# Patient Record
Sex: Female | Born: 1950 | Race: Black or African American | Hispanic: No | Marital: Married | Smoking: Never smoker
Health system: Southern US, Community
[De-identification: ages and names within clinical notes are randomized; demographics above are authoritative.]

## PROBLEM LIST (undated history)

## (undated) DIAGNOSIS — I10 Essential (primary) hypertension: Secondary | ICD-10-CM

## (undated) DIAGNOSIS — G43909 Migraine, unspecified, not intractable, without status migrainosus: Secondary | ICD-10-CM

## (undated) DIAGNOSIS — I2510 Atherosclerotic heart disease of native coronary artery without angina pectoris: Secondary | ICD-10-CM

## (undated) DIAGNOSIS — R112 Nausea with vomiting, unspecified: Secondary | ICD-10-CM

## (undated) DIAGNOSIS — I700 Atherosclerosis of aorta: Secondary | ICD-10-CM

## (undated) DIAGNOSIS — D126 Benign neoplasm of colon, unspecified: Secondary | ICD-10-CM

## (undated) DIAGNOSIS — N920 Excessive and frequent menstruation with regular cycle: Secondary | ICD-10-CM

## (undated) DIAGNOSIS — J309 Allergic rhinitis, unspecified: Secondary | ICD-10-CM

## (undated) DIAGNOSIS — K219 Gastro-esophageal reflux disease without esophagitis: Secondary | ICD-10-CM

## (undated) DIAGNOSIS — J45909 Unspecified asthma, uncomplicated: Secondary | ICD-10-CM

## (undated) DIAGNOSIS — E538 Deficiency of other specified B group vitamins: Secondary | ICD-10-CM

## (undated) DIAGNOSIS — E785 Hyperlipidemia, unspecified: Secondary | ICD-10-CM

## (undated) DIAGNOSIS — A64 Unspecified sexually transmitted disease: Secondary | ICD-10-CM

## (undated) DIAGNOSIS — M797 Fibromyalgia: Secondary | ICD-10-CM

## (undated) DIAGNOSIS — G479 Sleep disorder, unspecified: Secondary | ICD-10-CM

## (undated) DIAGNOSIS — Z9889 Other specified postprocedural states: Secondary | ICD-10-CM

## (undated) DIAGNOSIS — K2950 Unspecified chronic gastritis without bleeding: Secondary | ICD-10-CM

## (undated) DIAGNOSIS — I491 Atrial premature depolarization: Secondary | ICD-10-CM

## (undated) DIAGNOSIS — M1990 Unspecified osteoarthritis, unspecified site: Secondary | ICD-10-CM

## (undated) DIAGNOSIS — T7840XA Allergy, unspecified, initial encounter: Secondary | ICD-10-CM

## (undated) HISTORY — PX: TONSILLECTOMY: SHX5217

## (undated) HISTORY — DX: Unspecified osteoarthritis, unspecified site: M19.90

## (undated) HISTORY — PX: OTHER SURGICAL HISTORY: SHX169

## (undated) HISTORY — DX: Atherosclerotic heart disease of native coronary artery without angina pectoris: I25.10

## (undated) HISTORY — DX: Benign neoplasm of colon, unspecified: D12.6

## (undated) HISTORY — DX: Gastro-esophageal reflux disease without esophagitis: K21.9

## (undated) HISTORY — DX: Atrial premature depolarization: I49.1

## (undated) HISTORY — DX: Hyperlipidemia, unspecified: E78.5

## (undated) HISTORY — DX: Unspecified asthma, uncomplicated: J45.909

## (undated) HISTORY — DX: Essential (primary) hypertension: I10

## (undated) HISTORY — DX: Allergic rhinitis, unspecified: J30.9

## (undated) HISTORY — DX: Deficiency of other specified B group vitamins: E53.8

## (undated) HISTORY — DX: Allergy, unspecified, initial encounter: T78.40XA

## (undated) HISTORY — DX: Excessive and frequent menstruation with regular cycle: N92.0

## (undated) HISTORY — DX: Atherosclerosis of aorta: I70.0

## (undated) HISTORY — DX: Sleep disorder, unspecified: G47.9

## (undated) HISTORY — DX: Unspecified sexually transmitted disease: A64

## (undated) HISTORY — DX: Fibromyalgia: M79.7

## (undated) HISTORY — PX: CATARACT EXTRACTION: SUR2

## (undated) HISTORY — DX: Unspecified chronic gastritis without bleeding: K29.50

---

## 1996-02-28 ENCOUNTER — Encounter (INDEPENDENT_AMBULATORY_CARE_PROVIDER_SITE_OTHER): Payer: Self-pay | Admitting: Gastroenterology

## 1996-05-03 HISTORY — PX: VAGINAL HYSTERECTOMY: SHX2639

## 1997-10-29 ENCOUNTER — Encounter: Payer: Self-pay | Admitting: Gastroenterology

## 1997-10-30 ENCOUNTER — Encounter: Payer: Self-pay | Admitting: Gastroenterology

## 1998-10-20 ENCOUNTER — Ambulatory Visit (HOSPITAL_COMMUNITY): Admission: RE | Admit: 1998-10-20 | Discharge: 1998-10-20 | Payer: Self-pay | Admitting: *Deleted

## 1998-10-20 ENCOUNTER — Encounter: Payer: Self-pay | Admitting: *Deleted

## 1998-10-24 ENCOUNTER — Ambulatory Visit (HOSPITAL_COMMUNITY): Admission: RE | Admit: 1998-10-24 | Discharge: 1998-10-24 | Payer: Self-pay | Admitting: *Deleted

## 1999-09-15 ENCOUNTER — Encounter: Admission: RE | Admit: 1999-09-15 | Discharge: 1999-10-19 | Payer: Self-pay | Admitting: *Deleted

## 2000-06-22 ENCOUNTER — Encounter: Payer: Self-pay | Admitting: *Deleted

## 2000-06-22 ENCOUNTER — Encounter: Admission: RE | Admit: 2000-06-22 | Discharge: 2000-06-22 | Payer: Self-pay | Admitting: *Deleted

## 2001-05-03 HISTORY — PX: OTHER SURGICAL HISTORY: SHX169

## 2001-07-20 ENCOUNTER — Encounter: Payer: Self-pay | Admitting: Gastroenterology

## 2001-11-07 ENCOUNTER — Encounter: Admission: RE | Admit: 2001-11-07 | Discharge: 2002-02-05 | Payer: Self-pay | Admitting: Internal Medicine

## 2001-11-21 ENCOUNTER — Ambulatory Visit (HOSPITAL_COMMUNITY): Admission: RE | Admit: 2001-11-21 | Discharge: 2001-11-21 | Payer: Self-pay | Admitting: Internal Medicine

## 2001-11-21 ENCOUNTER — Encounter: Payer: Self-pay | Admitting: Internal Medicine

## 2001-11-23 ENCOUNTER — Other Ambulatory Visit: Admission: RE | Admit: 2001-11-23 | Discharge: 2001-11-23 | Payer: Self-pay | Admitting: Obstetrics and Gynecology

## 2001-12-19 ENCOUNTER — Encounter: Payer: Self-pay | Admitting: Internal Medicine

## 2001-12-19 ENCOUNTER — Encounter: Admission: RE | Admit: 2001-12-19 | Discharge: 2001-12-19 | Payer: Self-pay | Admitting: Internal Medicine

## 2002-11-28 ENCOUNTER — Other Ambulatory Visit: Admission: RE | Admit: 2002-11-28 | Discharge: 2002-11-28 | Payer: Self-pay | Admitting: Obstetrics and Gynecology

## 2003-11-22 ENCOUNTER — Encounter: Admission: RE | Admit: 2003-11-22 | Discharge: 2003-11-22 | Payer: Self-pay | Admitting: Emergency Medicine

## 2003-12-03 ENCOUNTER — Encounter: Admission: RE | Admit: 2003-12-03 | Discharge: 2003-12-03 | Payer: Self-pay | Admitting: Emergency Medicine

## 2004-01-15 ENCOUNTER — Encounter: Payer: Self-pay | Admitting: Gastroenterology

## 2004-03-12 ENCOUNTER — Encounter: Admission: RE | Admit: 2004-03-12 | Discharge: 2004-03-12 | Payer: Self-pay | Admitting: Emergency Medicine

## 2004-11-15 ENCOUNTER — Observation Stay (HOSPITAL_COMMUNITY): Admission: EM | Admit: 2004-11-15 | Discharge: 2004-11-16 | Payer: Self-pay | Admitting: *Deleted

## 2004-12-24 ENCOUNTER — Encounter: Admission: RE | Admit: 2004-12-24 | Discharge: 2004-12-24 | Payer: Self-pay | Admitting: Emergency Medicine

## 2005-01-06 ENCOUNTER — Ambulatory Visit (HOSPITAL_BASED_OUTPATIENT_CLINIC_OR_DEPARTMENT_OTHER): Admission: RE | Admit: 2005-01-06 | Discharge: 2005-01-06 | Payer: Self-pay | Admitting: Emergency Medicine

## 2005-01-10 ENCOUNTER — Ambulatory Visit: Payer: Self-pay | Admitting: Internal Medicine

## 2005-05-20 ENCOUNTER — Ambulatory Visit: Payer: Self-pay | Admitting: Gastroenterology

## 2005-06-02 ENCOUNTER — Encounter (INDEPENDENT_AMBULATORY_CARE_PROVIDER_SITE_OTHER): Payer: Self-pay | Admitting: Specialist

## 2005-06-02 ENCOUNTER — Ambulatory Visit: Payer: Self-pay | Admitting: Gastroenterology

## 2006-04-04 ENCOUNTER — Encounter: Admission: RE | Admit: 2006-04-04 | Discharge: 2006-04-04 | Payer: Self-pay | Admitting: Emergency Medicine

## 2006-09-02 ENCOUNTER — Emergency Department (HOSPITAL_COMMUNITY): Admission: EM | Admit: 2006-09-02 | Discharge: 2006-09-02 | Payer: Self-pay | Admitting: *Deleted

## 2007-04-24 ENCOUNTER — Encounter: Admission: RE | Admit: 2007-04-24 | Discharge: 2007-04-24 | Payer: Self-pay | Admitting: Emergency Medicine

## 2007-09-07 ENCOUNTER — Encounter: Admission: RE | Admit: 2007-09-07 | Discharge: 2007-09-07 | Payer: Self-pay | Admitting: Cardiology

## 2007-11-20 ENCOUNTER — Encounter: Admission: RE | Admit: 2007-11-20 | Discharge: 2007-11-20 | Payer: Self-pay | Admitting: Emergency Medicine

## 2007-11-21 ENCOUNTER — Encounter (INDEPENDENT_AMBULATORY_CARE_PROVIDER_SITE_OTHER): Payer: Self-pay | Admitting: *Deleted

## 2007-11-21 ENCOUNTER — Encounter: Admission: RE | Admit: 2007-11-21 | Discharge: 2007-11-21 | Payer: Self-pay | Admitting: Emergency Medicine

## 2008-05-13 ENCOUNTER — Encounter: Admission: RE | Admit: 2008-05-13 | Discharge: 2008-05-13 | Payer: Self-pay | Admitting: Emergency Medicine

## 2008-05-20 ENCOUNTER — Encounter: Payer: Self-pay | Admitting: Family Medicine

## 2008-10-01 LAB — CONVERTED CEMR LAB
Pap Smear: NORMAL
Pap Smear: NORMAL
Pap Smear: NORMAL
Pap Smear: NORMAL
Pap Smear: NORMAL
Pap Smear: NORMAL

## 2008-12-10 ENCOUNTER — Encounter (INDEPENDENT_AMBULATORY_CARE_PROVIDER_SITE_OTHER): Payer: Self-pay | Admitting: *Deleted

## 2008-12-18 ENCOUNTER — Ambulatory Visit: Payer: Self-pay | Admitting: Family Medicine

## 2008-12-18 DIAGNOSIS — D126 Benign neoplasm of colon, unspecified: Secondary | ICD-10-CM

## 2008-12-18 DIAGNOSIS — E785 Hyperlipidemia, unspecified: Secondary | ICD-10-CM | POA: Insufficient documentation

## 2008-12-18 DIAGNOSIS — M19042 Primary osteoarthritis, left hand: Secondary | ICD-10-CM

## 2008-12-18 DIAGNOSIS — G43009 Migraine without aura, not intractable, without status migrainosus: Secondary | ICD-10-CM | POA: Insufficient documentation

## 2008-12-18 DIAGNOSIS — M47812 Spondylosis without myelopathy or radiculopathy, cervical region: Secondary | ICD-10-CM | POA: Insufficient documentation

## 2008-12-18 DIAGNOSIS — M19041 Primary osteoarthritis, right hand: Secondary | ICD-10-CM

## 2008-12-18 DIAGNOSIS — I10 Essential (primary) hypertension: Secondary | ICD-10-CM

## 2008-12-18 DIAGNOSIS — M545 Low back pain, unspecified: Secondary | ICD-10-CM | POA: Insufficient documentation

## 2008-12-18 DIAGNOSIS — G4700 Insomnia, unspecified: Secondary | ICD-10-CM

## 2008-12-18 DIAGNOSIS — M542 Cervicalgia: Secondary | ICD-10-CM

## 2008-12-18 DIAGNOSIS — J309 Allergic rhinitis, unspecified: Secondary | ICD-10-CM | POA: Insufficient documentation

## 2008-12-18 LAB — CONVERTED CEMR LAB: Cholesterol, target level: 200 mg/dL

## 2008-12-24 ENCOUNTER — Telehealth: Payer: Self-pay | Admitting: Family Medicine

## 2008-12-24 LAB — CONVERTED CEMR LAB
ALT: 13 units/L (ref 0–35)
BUN: 14 mg/dL (ref 6–23)
Bilirubin, Direct: 0 mg/dL (ref 0.0–0.3)
Calcium: 9.2 mg/dL (ref 8.4–10.5)
Cholesterol: 167 mg/dL (ref 0–200)
Creatinine, Ser: 0.8 mg/dL (ref 0.4–1.2)
HDL: 56.7 mg/dL (ref >39.00)
LDL Cholesterol: 94 mg/dL (ref 0–99)
Total Bilirubin: 0.6 mg/dL (ref 0.3–1.2)
Total CHOL/HDL Ratio: 3
Triglycerides: 84 mg/dL (ref 0.0–149.0)
VLDL: 16.8 mg/dL (ref 0.0–40.0)

## 2009-02-04 ENCOUNTER — Ambulatory Visit: Payer: Self-pay | Admitting: Family Medicine

## 2009-02-04 DIAGNOSIS — J45909 Unspecified asthma, uncomplicated: Secondary | ICD-10-CM | POA: Insufficient documentation

## 2009-02-04 DIAGNOSIS — R0789 Other chest pain: Secondary | ICD-10-CM

## 2009-02-05 ENCOUNTER — Ambulatory Visit: Payer: Self-pay | Admitting: Gastroenterology

## 2009-02-05 DIAGNOSIS — Z860100 Personal history of colon polyps, unspecified: Secondary | ICD-10-CM | POA: Insufficient documentation

## 2009-02-05 DIAGNOSIS — K5900 Constipation, unspecified: Secondary | ICD-10-CM | POA: Insufficient documentation

## 2009-02-13 ENCOUNTER — Ambulatory Visit: Payer: Self-pay | Admitting: Internal Medicine

## 2009-02-17 ENCOUNTER — Encounter: Payer: Self-pay | Admitting: Family Medicine

## 2009-03-11 ENCOUNTER — Ambulatory Visit: Payer: Self-pay | Admitting: Gastroenterology

## 2009-03-11 ENCOUNTER — Encounter: Payer: Self-pay | Admitting: Gastroenterology

## 2009-03-11 LAB — HM COLONOSCOPY

## 2009-03-12 ENCOUNTER — Encounter: Payer: Self-pay | Admitting: Gastroenterology

## 2009-03-18 ENCOUNTER — Telehealth: Payer: Self-pay | Admitting: Family Medicine

## 2009-04-03 ENCOUNTER — Telehealth: Payer: Self-pay | Admitting: Family Medicine

## 2009-04-08 ENCOUNTER — Ambulatory Visit: Payer: Self-pay | Admitting: Family Medicine

## 2009-04-15 ENCOUNTER — Telehealth: Payer: Self-pay | Admitting: Gastroenterology

## 2009-05-01 ENCOUNTER — Telehealth: Payer: Self-pay | Admitting: Family Medicine

## 2009-05-14 ENCOUNTER — Telehealth: Payer: Self-pay | Admitting: Gastroenterology

## 2009-05-20 ENCOUNTER — Telehealth: Payer: Self-pay | Admitting: Family Medicine

## 2009-06-04 ENCOUNTER — Ambulatory Visit: Payer: Self-pay | Admitting: Family Medicine

## 2009-06-04 DIAGNOSIS — M7542 Impingement syndrome of left shoulder: Secondary | ICD-10-CM

## 2009-06-10 ENCOUNTER — Encounter: Payer: Self-pay | Admitting: Family Medicine

## 2009-06-10 LAB — HM MAMMOGRAPHY: HM Mammogram: NORMAL

## 2009-06-16 ENCOUNTER — Encounter: Payer: Self-pay | Admitting: Family Medicine

## 2009-06-17 ENCOUNTER — Telehealth: Payer: Self-pay | Admitting: Family Medicine

## 2009-06-17 ENCOUNTER — Ambulatory Visit: Payer: Self-pay | Admitting: Family Medicine

## 2009-06-17 DIAGNOSIS — R002 Palpitations: Secondary | ICD-10-CM

## 2009-06-18 ENCOUNTER — Telehealth: Payer: Self-pay | Admitting: Gastroenterology

## 2009-06-19 ENCOUNTER — Encounter: Payer: Self-pay | Admitting: Internal Medicine

## 2009-06-19 ENCOUNTER — Encounter: Admission: RE | Admit: 2009-06-19 | Discharge: 2009-06-19 | Payer: Self-pay | Admitting: Cardiology

## 2009-06-20 ENCOUNTER — Telehealth: Payer: Self-pay | Admitting: Internal Medicine

## 2009-06-20 ENCOUNTER — Ambulatory Visit: Payer: Self-pay | Admitting: Internal Medicine

## 2009-06-20 ENCOUNTER — Encounter: Payer: Self-pay | Admitting: Family Medicine

## 2009-06-20 LAB — CONVERTED CEMR LAB: Glucose, Bld: 103 mg/dL

## 2009-06-23 ENCOUNTER — Ambulatory Visit: Payer: Self-pay | Admitting: Family Medicine

## 2009-06-23 LAB — CONVERTED CEMR LAB
ALT: 13 units/L (ref 0–35)
Alkaline Phosphatase: 69 units/L (ref 39–117)
BUN: 13 mg/dL (ref 6–23)
Bilirubin, Direct: 0 mg/dL (ref 0.0–0.3)
Calcium: 9.7 mg/dL (ref 8.4–10.5)
Creatinine, Ser: 0.8 mg/dL (ref 0.4–1.2)
Eosinophils Relative: 2.8 % (ref 0.0–5.0)
GFR calc non Af Amer: 94.29 mL/min (ref >60)
Lymphocytes Relative: 38 % (ref 12.0–46.0)
MCV: 91.3 fL (ref 78.0–100.0)
Monocytes Absolute: 0.5 10*3/uL (ref 0.1–1.0)
Monocytes Relative: 10.5 % (ref 3.0–12.0)
Neutrophils Relative %: 48.2 % (ref 43.0–77.0)
Platelets: 390 10*3/uL (ref 150.0–400.0)
RBC: 4.43 M/uL (ref 3.87–5.11)
Total Bilirubin: 0.4 mg/dL (ref 0.3–1.2)
WBC: 4.3 10*3/uL — ABNORMAL LOW (ref 4.5–10.5)

## 2009-06-24 ENCOUNTER — Encounter: Admission: RE | Admit: 2009-06-24 | Discharge: 2009-06-24 | Payer: Self-pay | Admitting: Family Medicine

## 2009-07-16 ENCOUNTER — Ambulatory Visit: Payer: Self-pay | Admitting: Internal Medicine

## 2009-07-22 ENCOUNTER — Encounter: Payer: Self-pay | Admitting: Internal Medicine

## 2009-07-23 ENCOUNTER — Encounter: Admission: RE | Admit: 2009-07-23 | Discharge: 2009-07-23 | Payer: Self-pay | Admitting: Family Medicine

## 2009-07-24 ENCOUNTER — Telehealth: Payer: Self-pay | Admitting: Internal Medicine

## 2009-08-12 ENCOUNTER — Emergency Department: Payer: Self-pay | Admitting: Emergency Medicine

## 2009-08-21 ENCOUNTER — Ambulatory Visit: Payer: Self-pay | Admitting: Family Medicine

## 2009-08-29 ENCOUNTER — Encounter: Payer: Self-pay | Admitting: Family Medicine

## 2009-09-01 ENCOUNTER — Telehealth: Payer: Self-pay | Admitting: Internal Medicine

## 2009-09-23 ENCOUNTER — Telehealth: Payer: Self-pay | Admitting: Gastroenterology

## 2009-09-26 ENCOUNTER — Ambulatory Visit: Payer: Self-pay | Admitting: Internal Medicine

## 2009-09-26 DIAGNOSIS — F341 Dysthymic disorder: Secondary | ICD-10-CM

## 2009-09-30 ENCOUNTER — Ambulatory Visit: Payer: Self-pay | Admitting: Internal Medicine

## 2009-09-30 DIAGNOSIS — R1013 Epigastric pain: Secondary | ICD-10-CM

## 2009-09-30 DIAGNOSIS — K2990 Gastroduodenitis, unspecified, without bleeding: Secondary | ICD-10-CM

## 2009-09-30 DIAGNOSIS — K2970 Gastritis, unspecified, without bleeding: Secondary | ICD-10-CM | POA: Insufficient documentation

## 2009-09-30 DIAGNOSIS — R110 Nausea: Secondary | ICD-10-CM

## 2009-10-09 ENCOUNTER — Encounter: Payer: Self-pay | Admitting: Physician Assistant

## 2009-10-09 ENCOUNTER — Telehealth: Payer: Self-pay | Admitting: Physician Assistant

## 2009-10-14 ENCOUNTER — Telehealth: Payer: Self-pay | Admitting: Internal Medicine

## 2009-10-16 ENCOUNTER — Encounter: Payer: Self-pay | Admitting: Internal Medicine

## 2009-10-20 ENCOUNTER — Telehealth: Payer: Self-pay | Admitting: Internal Medicine

## 2009-11-10 ENCOUNTER — Ambulatory Visit: Payer: Self-pay | Admitting: Gastroenterology

## 2009-12-01 ENCOUNTER — Telehealth: Payer: Self-pay | Admitting: Family Medicine

## 2010-02-26 ENCOUNTER — Telehealth: Payer: Self-pay | Admitting: Gastroenterology

## 2010-03-17 ENCOUNTER — Ambulatory Visit: Payer: Self-pay | Admitting: Internal Medicine

## 2010-03-31 ENCOUNTER — Encounter: Payer: Self-pay | Admitting: Family Medicine

## 2010-04-01 ENCOUNTER — Ambulatory Visit: Payer: Self-pay | Admitting: Gastroenterology

## 2010-04-01 DIAGNOSIS — K921 Melena: Secondary | ICD-10-CM

## 2010-04-01 DIAGNOSIS — R05 Cough: Secondary | ICD-10-CM | POA: Insufficient documentation

## 2010-04-01 DIAGNOSIS — R059 Cough, unspecified: Secondary | ICD-10-CM | POA: Insufficient documentation

## 2010-04-02 ENCOUNTER — Telehealth: Payer: Self-pay | Admitting: Internal Medicine

## 2010-04-06 ENCOUNTER — Encounter: Payer: Self-pay | Admitting: Internal Medicine

## 2010-04-07 ENCOUNTER — Encounter: Payer: Self-pay | Admitting: Gastroenterology

## 2010-04-07 ENCOUNTER — Ambulatory Visit (HOSPITAL_COMMUNITY)
Admission: RE | Admit: 2010-04-07 | Discharge: 2010-04-07 | Payer: Self-pay | Source: Home / Self Care | Admitting: Gastroenterology

## 2010-04-14 ENCOUNTER — Encounter: Payer: Self-pay | Admitting: Gastroenterology

## 2010-05-06 ENCOUNTER — Emergency Department: Payer: Self-pay | Admitting: Unknown Physician Specialty

## 2010-05-23 ENCOUNTER — Encounter: Payer: Self-pay | Admitting: Emergency Medicine

## 2010-05-24 ENCOUNTER — Encounter: Payer: Self-pay | Admitting: Emergency Medicine

## 2010-05-25 ENCOUNTER — Encounter: Payer: Self-pay | Admitting: Emergency Medicine

## 2010-05-29 ENCOUNTER — Ambulatory Visit: Admit: 2010-05-29 | Payer: Self-pay | Admitting: Internal Medicine

## 2010-05-31 LAB — CONVERTED CEMR LAB
ALT: 13 units/L (ref 0–35)
AST: 19 units/L (ref 0–37)
Albumin: 3.8 g/dL (ref 3.5–5.2)
Alkaline Phosphatase: 64 units/L (ref 39–117)
BUN: 17 mg/dL (ref 6–23)
Basophils Relative: 1 % (ref 0.0–3.0)
CO2: 32 meq/L (ref 19–32)
Eosinophils Relative: 2.5 % (ref 0.0–5.0)
Glucose, Bld: 102 mg/dL — ABNORMAL HIGH (ref 70–99)
HCT: 38.8 % (ref 36.0–46.0)
MCV: 90.7 fL (ref 78.0–100.0)
Monocytes Absolute: 0.4 10*3/uL (ref 0.1–1.0)
Monocytes Relative: 8.6 % (ref 3.0–12.0)
Neutrophils Relative %: 47.2 % (ref 43.0–77.0)
Platelets: 360 10*3/uL (ref 150.0–400.0)
Potassium: 4.1 meq/L (ref 3.5–5.1)
RBC: 4.28 M/uL (ref 3.87–5.11)
Sodium: 142 meq/L (ref 135–145)
TSH: 2.09 microintl units/mL (ref 0.35–5.50)
Total Protein: 6.9 g/dL (ref 6.0–8.3)
WBC: 4.6 10*3/uL (ref 4.5–10.5)

## 2010-06-02 NOTE — Progress Notes (Signed)
Summary: PT NO SHOW FOR P/T  Phone Note From Other Clinic   Summary of Call: FYI TO PCP.... NO SHOW FOR PHYSICAL THERPY PER [facility] ORDERED IN FEB 2011 FOR SHOULDER-CALLED PT LEFT MESSAGE...Marland KitchenMarland KitchenDaine Gip  October 20, 2009 12:08 PM  Initial call taken by: Daine Gip,  October 20, 2009 12:08 PM  Follow-up for Phone Call        Sp w/ pt, can not finanically afford  P/T at this time. Says her back and neck is no better, she is taking her medications regularly, Pt no show for P/ T appt.Daine Gip  November 19, 2009 9:34 AM    Follow-up by: Daine Gip,  November 19, 2009 9:35 AM  Additional Follow-up for Phone Call Additional follow up Details #1::        noted Additional Follow-up by: Cindee Salt MD,  November 19, 2009 1:47 PM

## 2010-06-02 NOTE — Medication Information (Signed)
Summary: Clarification for Dexilant/[facility]  Clarification for Dexilant/[facility]   Imported By: Sherian Rein 10/10/2009 15:17:29  _____________________________________________________________________  External Attachment:    Type:   Image     Comment:   External Document

## 2010-06-02 NOTE — Assessment & Plan Note (Signed)
Summary: ROA 2 WEEKS  CYD   Vital Signs:  Patient profile:   60 year old female Height:      64 inches Weight:      167.0 pounds BMI:     28.77 Temp:     98.3 degrees F oral Pulse rate:   93 / minute Pulse rhythm:   regular BP sitting:   110 / 70  (left arm) Cuff size:   regular  Vitals Entered By: Benny Lennert CMA Duncan Dull) (June 17, 2009 9:57 AM)  History of Present Illness: Chief complaint 2 week follow up also complaining of palpatations  Improvement in left shoulder pain...80 % improvement. Doing home exercsies. Has not started  PT..had to reschedule.  Low back pain continuing...muscle relaxant at night helps, but pain come backs. Has been using diclofenac as needed. Feels both legs  weak occ numbness in posteroir legs...new for you. No radicular pain.  3 days ago pounding in chest. Some sore throat x 1 day. Some burning in chest to throat.  No pain in chest. Feels like heart is running fast then skipping.  No SOB. No fever. Last used diclofenac 4 days ago.  Has had some similar issues in past..more frequent in last 3 days. No change in diet, caffeine.  No increase in stress. No new OTC meds.  Using dexilan in last few months..Changed by Dr. Russella Dar.  Was taking prilosec in past.  Last  treadmill stress test 2 years ago..low risk normal.   Problems Prior to Update: 1)  Shoulder Impingement Syndrome, Left  (ICD-726.2) 2)  Gastroenteritis  (ICD-558.9) 3)  Constipation  (ICD-564.00) 4)  Personal Hx Colonic Polyps  (ICD-V12.72) 5)  Asthma, Persistent, Mild  (ICD-493.90) 6)  Chest Pain, Atypical  (ICD-786.59) 7)  Dizziness  (ICD-780.4) 8)  Family History of Cad Female 1st Degree Relative <50  (ICD-V17.3) 9)  Insomnia, Chronic  (ICD-307.42) 10)  Allergic Rhinitis  (ICD-477.9) 11)  Colonic Polyps, Benign  (ICD-211.3) 12)  Migraine, Common  (ICD-346.10) 13)  Hypertension  (ICD-401.9) 14)  Hyperlipidemia  (ICD-272.4) 15)  Gerd  (ICD-530.81) 16)  Degenerative Disc Disease   (ICD-722.6) 17)  Neck Pain, Chronic  (ICD-723.1) 18)  Low Back Pain, Chronic  (ICD-724.2) 19)  Osteoarthritis  (ICD-715.90)  Current Medications (verified): 1)  Loratadine 10 Mg Tabs (Loratadine) .... Take 1 Tablet By Mouth Once A Day 2)  Pravastatin Sodium 20 Mg Tabs (Pravastatin Sodium) .... Take 1 Tablet By Mouth At Bedtime 3)  Singulair 10 Mg Tabs (Montelukast Sodium) .... Take 1 Tab By Mouth At Bedtime 4)  Lorazepam 1 Mg Tabs (Lorazepam) .... Once Daily As Needed 5)  Atacand 16 Mg Tabs (Candesartan Cilexetil) .... Take 1 Tablet By Mouth Once A Day 6)  Zolpidem Tartrate 10 Mg Tabs (Zolpidem Tartrate) .... Take 1 Tab By Mouth At Bedtime As Needed 7)  Astepro 0.15 % Soln (Azelastine Hcl) .... 2 Sprays Per Nostril Two Times A Day 8)  Systane Ultra 0.4-0.3 % Soln (Polyethyl Glycol-Propyl Glycol) .... Two Drops in Each Eye Once Daily 9)  Vagifem 25 Mcg Tabs (Estradiol) .... As Needed 10)  Anusol-Hc 2.5 % Crea (Hydrocortisone) .... Apply To Affected Area Two Times A Day 11)  Vitamin D (Ergocalciferol) 50000 Unit Caps (Ergocalciferol) .... Take One By Mouth Once A Week 12)  Proair Hfa 108 (90 Base) Mcg/act Aers (Albuterol Sulfate) .... 2 Puff Inh Q6 Hours As Needed Wheeze, Shortness of Breath 13)  Zomig Zmt 2.5 Mg Tbdp (Zolmitriptan) .... As Directed 14)  Promethazine Hcl 25 Mg Tabs (Promethazine Hcl) .Marland Kitchen.. 1 By Mouth Up To Three Times A Day For Nausea 15)  Dexilant 60 Mg Cpdr (Dexlansoprazole) .Marland Kitchen.. 1 By Mouth Once Daily 16)  Diclofenac Sodium 75 Mg Tbec (Diclofenac Sodium) .Marland Kitchen.. 1 Tab By Mouth Two Times A Day 17)  Cyclobenzaprine Hcl 10 Mg Tabs (Cyclobenzaprine Hcl) .... 1/2 To 1  Tab By Mouth At Bedtime As Needed Muscle Spasm  Allergies (verified): No Known Drug Allergies  Past History:  Past medical, surgical, family and social histories (including risk factors) reviewed, and no changes noted (except as noted below).  Past Medical History: Reviewed history from 02/04/2009 and no changes  required. 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 2003 turbinate sinus surgery 1998 Hysterectomy, partial, vaginal: for mennorhagia.Marland Kitchenno cancer..no cervix Tonsillectomy   Family History: Reviewed history from 02/05/2009 and no changes required. father: died throat cancer, CAD MI age 4s, CVA during procedure, lung cancer Family History of CAD Female 1st degree relative <50 mother: CABG, CAD, CHF, HTNB, high chol, Aunts: DM PGM: DM, CAD MGM: CVA or MI No FH of Colon Cancer:  Social History: Reviewed history from 02/05/2009 and no changes required. 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 CV:  Complains of chest pain or discomfort. Resp:  Denies shortness of breath, sputum productive, and wheezing. GI:  Complains of abdominal pain; denies bloody stools, constipation, and diarrhea. GU:  Denies dysuria.  Physical Exam  General:  Well-developed,well-nourished,in no acute distress; alert,appropriate and cooperative throughout examination Mouth:  MMM Neck:  no carotid bruit or thyromegaly no cervical or supraclavicular lymphadenopathy  Chest Wall:  mild central ttp Lungs:  Normal respiratory effort, chest expands symmetrically. Lungs are clear to auscultation, no crackles or wheezes. Heart:  Normal rate and regular rhythm. S1 and S2 normal without gallop, murmur, click, rub or other extra sounds. Abdomen:  epigastrum ttp, no rebound, NABSno hepatomegaly and no splenomegaly.   Pulses:  R and L posterior tibial  pulses are full and equal bilaterally  Extremities:  no edema  Skin:  Intact without suspicious lesions or rashes   Impression & Recommendations:  Problem # 1:  SHOULDER IMPINGEMENT SYNDROME, LEFT (ICD-726.2) Improved with stretching and NSAID.  Problem # 2:  LOW BACK PAIN, CHRONIC (ICD-724.2) With acute worsening. Will eval further with Plain film. Recommend PT. If still no timproving at follow up in 2 weeks..will refer for MRI and steroid injections as needed.  Her updated medication list for this problem includes:    Diclofenac Sodium 75 Mg Tbec (Diclofenac sodium) .Marland Kitchen... 1 tab by mouth two times a day    Cyclobenzaprine Hcl 10 Mg Tabs (Cyclobenzaprine hcl) .Marland Kitchen... 1/2 to 1  tab by mouth at bedtime as needed muscle spasm  Orders: Radiology Referral (Radiology)  Problem # 3:  PALPITATIONS (ICD-785.1)  Likely due to PVCs..? worsened with diclofenac. Stop diclofenac.  EKG NSR, occ PVC, no ST changes, no Q, no LVH. If SOB or exertional chest pain...call. If not improving off diclofenac and with GERD treatment..consider cardiac eval.   Orders: EKG w/ Interpretation (93000)  Problem # 4:  GERD (ICD-530.81) Worsened with doiclofenac.Marland Kitchenalso ?  gastritis given epigastric pain. Stop diclofenac..change to protonix daily.  Her updated medication list for this problem includes:    Protonix 40 Mg Tbec (Pantoprazole sodium) .Marland Kitchen... 1 tab by mouth daily  Complete Medication List: 1)  Loratadine 10 Mg Tabs (Loratadine) .... Take 1 tablet by mouth once a day 2)  Pravastatin Sodium 20 Mg Tabs (Pravastatin sodium) .... Take 1 tablet by mouth at bedtime 3)  Singulair 10 Mg Tabs (Montelukast sodium) .... Take 1 tab by mouth at bedtime 4)  Lorazepam 1 Mg Tabs (Lorazepam) .... Once daily as needed 5)  Atacand 16 Mg Tabs (Candesartan cilexetil) .... Take 1 tablet by mouth once a day 6)  Zolpidem Tartrate 10 Mg Tabs (Zolpidem tartrate) .... Take 1 tab by mouth at bedtime as needed 7)  Astepro 0.15 % Soln  (Azelastine hcl) .... 2 sprays per nostril two times a day 8)  Systane Ultra 0.4-0.3 % Soln (Polyethyl glycol-propyl glycol) .... Two drops in each eye once daily 9)  Vagifem 25 Mcg Tabs (Estradiol) .... As needed 10)  Anusol-hc 2.5 % Crea (Hydrocortisone) .... Apply to affected area two times a day 11)  Vitamin D (ergocalciferol) 50000 Unit Caps (Ergocalciferol) .... Take one by mouth once a week 12)  Proair Hfa 108 (90 Base) Mcg/act Aers (Albuterol sulfate) .... 2 puff inh q6 hours as needed wheeze, shortness of breath 13)  Zomig Zmt 2.5 Mg Tbdp (Zolmitriptan) .... As directed 14)  Promethazine Hcl 25 Mg Tabs (Promethazine hcl) .Marland Kitchen.. 1 by mouth up to three times a day for nausea 15)  Protonix 40 Mg Tbec (Pantoprazole sodium) .Marland Kitchen.. 1 tab by mouth daily 16)  Diclofenac Sodium 75 Mg Tbec (Diclofenac sodium) .Marland Kitchen.. 1 tab by mouth two times a day 17)  Cyclobenzaprine Hcl 10 Mg Tabs (Cyclobenzaprine hcl) .... 1/2 to 1  tab by mouth at bedtime as needed muscle spasm  Patient Instructions: 1)  Referral Appointment Information 2)  Day/Date: 3)  Time: 4)  Place/MD: 5)  Address: 6)  Phone/Fax: 7)  Patient given appointment information. Information/Orders faxed/mailed. Remake appt with PT. Use muscle relaxant as needed.  8)  Hold diclofenac.  9)  Please schedule a follow-up appointment in 2 weeks.  Prescriptions: PROTONIX 40 MG TBEC (PANTOPRAZOLE SODIUM) 1 tab by mouth daily  #30 x 3   Entered and Authorized by:   Kerby Nora MD   Signed by:   Kerby Nora MD on 06/17/2009   Method used:   Electronically to        [facility] [address] (retail)       [address]       [place], [place]  [id number]       Ph: [phone]       Fax: [phone]   RxID:   [id number]   Current Allergies (reviewed today): No known allergies

## 2010-06-02 NOTE — Letter (Signed)
Summary: [facility]   Imported By: Lanelle Bal 09/05/2009 08:20:04  _____________________________________________________________________  External Attachment:    Type:   Image     Comment:   External Document  Appended Document: [facility] please contact pt about missing referral.

## 2010-06-02 NOTE — Assessment & Plan Note (Signed)
Summary: BUG BITE IN EAR   Vital Signs:  Patient profile:   60 year old female Weight:      169.75 pounds Temp:     98.5 degrees F oral Pulse rate:   96 / minute Pulse rhythm:   regular BP sitting:   112 / 68  (left arm) Cuff size:   large  Vitals Entered By: Sydell Axon LPN (August 21, 2009 11:44 AM) CC: Bug bite in right ear 2 days ago, ear aches   History of Present Illness: Pt here with husband. She is here for having had an  insect in the right ear which crawled out and the husband knocked it out and crushed it...had hard shell. This happened this wknd in [address] [place]  yard. She started with discomfort along the tract of the right ant cervial chain this AM. She otherwise feels fine.  Pt had been  seen at [facility] for choking on granola and took Prednisone for 5 days which she finished on Sun. She was in the car with her husband drivng down the road, began choking and he had to pull over, do a Heimlich maneuver on her and then was evaluated by EMTs and sent top ER where they prscribed something she couldn't get due to only one pharmacy after hours on Sun and ended up with Prednisone for five days. She has had Mobic in the past but has none now and knows not to use it generally.  Allergies: No Known Drug Allergies  Physical Exam  General:  alert and normal appearance.   Head:  normocephalic, atraumatic, and no abnormalities observed.   Eyes:  vision grossly intact, pupils equal, pupils round, and pupils reactive to light.  no conjunctival pallor, injection or icterus  Ears:  L ear nml. R ear also looks nml, no sign of infection of skin of canal, no sign of acute bug bite or break in the skin, either in the ear or outside.. Pain of the ear is actually pain along ant cerv chain. Nose:  Nml. Cervical Nodes:  Mild shoddy, mobile right ant cerv adenopathy from the angle of the tragus area down the neck approx 3cm.   Impression & Recommendations:  Problem # 1:  INSECT IN RIGHT  EAR, POSS BITE (ICD-919.4) Assessment New Tried to reasssure. Use heat on the adenopathy. If worsens, return. No infection or bite seen in canal or on exterior area. .  Complete Medication List: 1)  Loratadine 10 Mg Tabs (Loratadine) .... Take 1 tablet by mouth once a day 2)  Pravastatin Sodium 20 Mg Tabs (Pravastatin sodium) .... Take 1 tablet by mouth at bedtime 3)  Singulair 10 Mg Tabs (Montelukast sodium) .... Take 1 tab by mouth at bedtime 4)  Lorazepam 1 Mg Tabs (Lorazepam) .... Once daily as needed 5)  Atacand 16 Mg Tabs (Candesartan cilexetil) .... Take 1 tablet by mouth once a day 6)  Astepro 0.15 % Soln (Azelastine hcl) .... 2 sprays per nostril two times a day 7)  Systane Ultra 0.4-0.3 % Soln (Polyethyl glycol-propyl glycol) .... Two drops in each eye once daily 8)  Vagifem 25 Mcg Tabs (Estradiol) .... As needed 9)  Vitamin D (ergocalciferol) 50000 Unit Caps (Ergocalciferol) .... Take one by mouth once a week 10)  Dexilant 60 Mg Cpdr (Dexlansoprazole) .... Take 1 by mouth once daily 11)  Zolpidem Tartrate 10 Mg Tabs (Zolpidem tartrate) .... 1/2 -1 tab at bedtime as needed to help sleep 12)  Zomig Zmt 2.5  Mg Tbdp (Zolmitriptan) .... Use 1 by mouth at the onset of headache  Patient Instructions: 1)  RTC if sxs don't resolve.  Current Allergies (reviewed today): No known allergies

## 2010-06-02 NOTE — Progress Notes (Signed)
Summary: Reflux   Phone Note Call from Patient Call back at Home Phone [phone]   Call For: Dr Russella Dar Summary of Call: The medications she was given are not working and she has really bad reflux is unable to wait until dec 7th - refused to schedule next avail. Initial call taken by: Leanor Kail [facility],  February 26, 2010 11:54 AM  Follow-up for Phone Call        Patient advised to continue her carafate and dexilant.  I have moved her appointment to Nov 30.  I have reviewed an antireflux diet and measures with her.   I have placed her on a cancelation list. Patient instructed to maintain an anti-reflux diet. Advised to avoid caffeine, mint, citrus foods/juices, tomatoes, and  chocolate. Instructed not to eat within 2 hours of exercise or bed, multiple small meals are better than 3 large meals.  Need to take PPI 30 minutes prior to 1st meal of the day.  She will call back for continued questions or worsening of symptoms Follow-up by: Darcey Nora RN, CGRN,  February 26, 2010 12:37 PM  Additional Follow-up for Phone Call Additional follow up Details #1::        Agree with above. Make sure she takes Dexilant 30 min before breakfast, every day and that she avoids ASA/NSAIDs too. Advise 4" bedblocks. Additional Follow-up by: Meryl Dare MD FACG,  February 26, 2010 6:34 PM    Additional Follow-up for Phone Call Additional follow up Details #2::    Patient  advised of Dr Ardell Isaacs additional recommendations.  She is requesting a refill on her carafate which I have sent to the pharmacy. Follow-up by: Darcey Nora RN, CGRN,  February 27, 2010 8:38 AM  Prescriptions: CARAFATE 1 GM/10ML SUSP (SUCRALFATE) Take 1 gram after meals and at bedtime  #1 month x 1   Entered by:   Darcey Nora RN, CGRN   Authorized by:   Meryl Dare MD [facility]   Signed by:   Darcey Nora RN, CGRN on 02/27/2010   Method used:   Electronically to        [facility] (retail)       [address]       [place], [place]  [id number]       Ph: [phone]       Fax: [phone]   RxID:   [id number]

## 2010-06-02 NOTE — Progress Notes (Signed)
Summary: Rx Singulair and Pravastatin  Phone Note Call from Patient   Caller: Patient Call For: Cindee Salt MD Summary of Call: Patient called stating that 2 rx's were sent to [facility] and they are more expensive getting them thru the mail than at a local pharmacy. Patient would like rx's to be sent to [facility] aid [phone]) for Singulair and Pravastatin. [facility] has cancelled out these rx's sent to them. Initial call taken by: Sydell Axon LPN,  December 01, 2009 11:44 AM  Follow-up for Phone Call        px written on EMR for call in  Follow-up by: Judith Part MD,  December 01, 2009 11:54 AM    Prescriptions: SINGULAIR 10 MG TABS (MONTELUKAST SODIUM) Take 1 tab by mouth at bedtime  #30 x 11   Entered by:   Delilah Shan CMA (AAMA)   Authorized by:   Judith Part MD   Signed by:   Delilah Shan CMA (AAMA) on 12/01/2009   Method used:   Electronically to        [facility] (retail)       [address]       [place], [place]  [id number]       Ph: [phone]       Fax: [phone]   RxID:   [id number] PRAVASTATIN SODIUM 20 MG TABS (PRAVASTATIN SODIUM) Take 1 tablet by mouth at bedtime  #30 x 11   Entered by:   Delilah Shan CMA (AAMA)   Authorized by:   Judith Part MD   Signed by:   Delilah Shan CMA (AAMA) on 12/01/2009   Method used:   Electronically to        [facility] [address] (retail)       [address]       [place], [place]  [id number]       Ph: [phone]       Fax: [phone]   RxID:   [id number]

## 2010-06-02 NOTE — Assessment & Plan Note (Signed)
Summary: GERD/abdominal pain/sheri    History of Present Illness Visit Type: Follow-up Visit Primary GI MD: Elie Goody MD [facility] Primary Provider: Tillman Abide, MD Requesting Provider: NA Chief Complaint: Genella Rife & nausea History of Present Illness:   [name] KNOWN TO DR. Russella Dar WITH HX OF GERD AND CHRONIC GASTRITIS. SHE HAD EGD IN 11/10 WHICH SHOWED  MILD NODULAR GASTRITIS. BX SHOWED FOVEOLAR HYPERPLASIA/CHRONIC GASTRITIS. SHE ALSO HAD ABD US DONE 2/11 WHICH WAS NEGATIVE. SHE COMES IN NOW WITH C/O INCREASED SXS X 3 WEEKS. SHE IS ON DEXILANT 60 MG DAILY WHICH HAD BEEN WORKING WELL. SHE HAD A CHOKING EPISODE ABOUT 3 WEEKS AGO,HAD TO HAVE THE HEIMLICH PREFORMED,HAD PERSISTENT COUGHING AND WAS FELT TO HAVE ASPIRATED. SHE RECIEVED A STEROID DOSE PACK AND ONE DOSE OF IV ABX.SHE ALSO HAD SOME DENTAL WORK DONE AROUND THAT SAME TIME AND HAS BEEN TAKING SOME IBUPROFEN. SHE C/O NAUSEA ESPECIALLY EARLY IN THE MORNING AND GNAWING DISCOMFORT ON AN EMPTY STOMACH. SHE HAS A FULL FEELING IN HER ESOPHAGUS, AND FEELS SHE HAS TO CLEAR HER THROAT ALOT. NO VOMITING, NO WEIGHT LOSS.   GI Review of Systems    Reports abdominal pain, acid reflux, and  nausea.     Location of  Abdominal pain: epigastric area.    Denies belching, bloating, chest pain, dysphagia with liquids, dysphagia with solids, heartburn, loss of appetite, vomiting, vomiting blood, weight loss, and  weight gain.      Reports constipation and  hemorrhoids.     Denies anal fissure, black tarry stools, change in bowel habit, diarrhea, diverticulosis, fecal incontinence, heme positive stool, irritable bowel syndrome, jaundice, light color stool, liver problems, rectal bleeding, and  rectal pain.    Current Medications (verified): 1)  Loratadine 10 Mg Tabs (Loratadine) .... Take 1 Tablet By Mouth Once A Day 2)  Pravastatin Sodium 20 Mg Tabs (Pravastatin Sodium) .... Take 1 Tablet By Mouth At Bedtime 3)  Singulair 10 Mg Tabs (Montelukast Sodium)  .... Take 1 Tab By Mouth At Bedtime 4)  Lorazepam 1 Mg Tabs (Lorazepam) .... Once Daily As Needed 5)  Atacand 16 Mg Tabs (Candesartan Cilexetil) .... Take 1 Tablet By Mouth Once A Day 6)  Astepro 0.15 % Soln (Azelastine Hcl) .... 2 Sprays Per Nostril Two Times A Day 7)  Vagifem 25 Mcg Tabs (Estradiol) .... As Needed 8)  Vitamin D (Ergocalciferol) 50000 Unit Caps (Ergocalciferol) .... Take One By Mouth Once A Week 9)  Dexilant 60 Mg Cpdr (Dexlansoprazole) .... Take 1 By Mouth Once Daily 10)  Zolpidem Tartrate 10 Mg Tabs (Zolpidem Tartrate) .... 1/2 -1 Tab At Bedtime As Needed To Help Sleep 11)  Zomig Zmt 2.5 Mg Tbdp (Zolmitriptan) .... 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 2003 turbinate sinus surgery 1998 Hysterectomy, partial, vaginal: for mennorhagia.Marland Kitchenno cancer..no cervix Tonsillectomy  Family History: Reviewed history from 02/05/2009 and no changes required. father: died throat cancer, CAD MI age 55s, CVA during procedure, lung cancer Family History of CAD Female 1st degree relative <50 mother: CABG, CAD, CHF, HTNB, high chol, Aunts: DM PGM: DM, CAD MGM: CVA or MI No FH of Colon Cancer:  Social History: Reviewed history from 09/26/2009 and no changes required.  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  The patient denies anemia, blood in urine, breast changes/lumps, change in vision, confusion, coughing up blood, depression-new, fainting, fever, hearing problems, heart murmur, menstrual pain, muscle pains/cramps, night sweats, nosebleeds, pregnancy symptoms, skin rash, sore throat, swelling of feet/legs, thirst - excessive , urination - excessive , urination changes/pain, urine leakage, vision changes, and voice change.         ROS OTHERWISE AS IN HPI  Vital Signs:  Patient profile:   60 year old female Height:      64 inches Weight:      168.38 pounds BMI:     29.01 Pulse rate:   64 / minute Pulse rhythm:   regular BP sitting:   110 / 66  (left arm) Cuff size:   regular  Vitals Entered By: June McMurray CMA Duncan Dull) (Sep 30, 2009 9:38 AM)  Physical Exam  General:  Well developed, well nourished, no acute distress. Head:  Normocephalic and atraumatic. Eyes:  PERRLA, no icterus. Lungs:  Clear throughout to auscultation. Heart:  Regular rate and rhythm; no murmurs, rubs,  or bruits. Abdomen:  SOFT, BS+, NO PALP MASS OR HSM, MILD DIFFUSE TENDERNESS Rectal:  NOT DONE Extremities:  No clubbing, cyanosis, edema or deformities noted. Neurologic:  Alert and  oriented x4;  grossly normal neurologically. Psych:  Alert and cooperative. Normal mood and affect.   Impression & Recommendations:  Problem # 1:  ABDOMINAL PAIN-EPIGASTRIC (ICD-789.06) Assessment Deteriorated [name] WITH HX OF CHRONIC GASTRITIS MAINTAINED ON DEXILANT WITH 3 WEEK HX OF POORLY CONTROLLED SXS WITH HEARTBURN, GNAWING EPIGASTRIC PAIN AND NAUSEA. SUSPECT EXACERBATION OF GASTRITIS-MED INDUCED WITH NSAID USE AND STEROID DOSE PACK.  CONTINUE DEXILANT 60 MG DAILY-SWITCH TO AM DOSING ADD CARAFATE LIQUID 1 GM ONE HOUR AFTER MEALSAND AT BEDTIME X 2 WEEKS STOP IBUPROFEN FOLLOW UP WITH DR  Russella Dar IN 2 -3 WEEKS IF SXS HAVE NOT RESOLVED.  Problem # 2:  PERSONAL HX COLONIC POLYPS (ICD-V12.72) Assessment: Comment Only FOLLOW UP DUE 03/2014  Patient Instructions: 1)  We sent a perscription for Carafatae Slurry to [facility]. 2)  We sent 6 refills for Dexilant to [facility]. 3)  Call us back in 1 week if symptoms do not resolve. 4)  Copy sent to : Dr. Tillman Abide 5)  The medication list was reviewed and reconciled.  All changed / newly prescribed medications were explained.  A complete medication list was provided to the patient / caregiver. Prescriptions: CARAFATE 1 GM/10ML SUSP (SUCRALFATE) Take 1 gram after meals and at bedtime  #120 x 1   Entered by:   Lowry Ram NCMA   Authorized by:   Sammuel Cooper PA-c   Signed by:   Lowry Ram NCMA on 09/30/2009   Method used:   Electronically to        [id number] [address] (retail)       [address]       [place], [place]  [id number]       Ph:  [phone]       Fax: [phone]   RxID:   [id number] DEXILANT 60 MG CPDR (DEXLANSOPRAZOLE) take 1 by mouth once daily  #30 x 6   Entered by:   Lowry Ram NCMA   Authorized by:   Sammuel Cooper PA-c   Signed by:   Lowry Ram NCMA on 09/30/2009   Method used:   Electronically to        [facility]* (mail-order)             ,          Ph: [phone]       Fax: [phone]   RxID:   [id number]

## 2010-06-02 NOTE — Progress Notes (Signed)
Summary: low blood sugar  Phone Note Call from Patient   Caller: Patient Call For: Kerby Nora MD Summary of Call: Cardiologist order blood work on patient, she says her blood sugar was low, 40.  She wantes to know if certain medication can cause her blood sugar to run low.  Wants a call back from Dr. Ermalene Searing or her nurse today.  She says that everytime she calls she never gets a call back until 2-3 days later.  Suppose to be going out of town but is unsure whether she should go now.  Please advise Initial call taken by: Linde Gillis CMA Duncan Dull),  June 20, 2009 11:57 AM  Follow-up for Phone Call        Please let her know that she is not on any meds that can cause low blood sugar.  She needs to eat 3 meals daily and have healthy snacks in between.  If she is feeling better, okay to go out of town.  If still with symtpoms of palpitations... going out of town depends on what cardiologist felt was going on.   Follow-up by: Kerby Nora MD,  June 20, 2009 12:06 PM  Additional Follow-up for Phone Call Additional follow up Details #1::        Cardiology said that she can go out of town but, he said that alot of her symptoms. Additional Follow-up by: Benny Lennert CMA Duncan Dull),  June 20, 2009 12:13 PM    Additional Follow-up for Phone Call Additional follow up Details #2::    Patient wants to have it rechecked when do you want this to be done. Has been online reading about low blood sugar and is really wierd about having this condition. Please advise what to do. Benny Lennert CMA Duncan Dull),  June 20, 2009 12:16 PM  spoke with Dr. Alphonsus Sias and advised pt to come in for nurse visit finger stick. DeShannon Smith CMA Duncan Dull)  June 20, 2009 2:45 PM   patient and husband are now asking to switch physicans. Patient states when she calls office she never get a call back until several days later. Patient and husband was very upset and stated that no one even tried to help them until Dr.  Alphonsus Sias and I got involved. Please advise. DeShannon Smith CMA Duncan Dull)  June 20, 2009 3:42 PM    I will refer this note to Dr Ermalene Searing to review I did discuss with them in the office that the 44 lab value was undoubtedly not a true abnormal--this was due to some technical issue. I am not looking  to pick up more paitents but if she insists, I would not refuse her Cindee Salt MD  June 20, 2009 3:53 PM   Additional Follow-up for Phone Call Additional follow up Details #3:: Details for Additional Follow-up Action Taken: Please let pt know I am sorry for past delay's in the office.Blood sugar on random check at RN visit today normal.  Let her know I will review the cardiologists note. I reviewed her labs. Please obtain OV note from the cardiologist she saw recently. I will let her know any further recommendations after I look at his note. Keep follow up appt if not improving. Let her know Dr. Alphonsus Sias not accepting new patients per my discussion with him.  Kerby Nora MD,  June 20, 2009 4:23 PM   patient is in this morning for labs and appt,  and I advised results, she would like to switch to Dr. Julius Bowels  Smith CMA Duncan Dull)  June 23, 2009 10:28 AM   I sent Dr. Alphonsus Sias a note about this pt..he declined to accept her. If she would like to change physicians we will have to ask TAlia or spencer..unless letvak has changed his mind.  Kerby Nora MD  June 23, 2009 11:02 AM   Okay to switch to me if she is insistent. Please set up an appt in the next 1-2 months unless she needs to be seen sooner. Richard Dia Crawford MD  June 23, 2009 1:37 PM   Patient will call as needed for follow-up  Additional Follow-up by: DeShannon Katrinka Blazing CMA Duncan Dull),  June 23, 2009 2:30 PM

## 2010-06-02 NOTE — Progress Notes (Signed)
Summary: refill request for ambien  Phone Note Refill Request Message from:  Fax from Pharmacy  Refills Requested: Medication #1:  ZOLPIDEM TARTRATE 10 MG TABS 1/2 -1 tab at bedtime as needed to help sleep   Last Refilled: 02/18/2010 [facility]axed request from [facility] is on your desk.  Initial call taken by: Lowella Petties CMA, AAMA,  April 02, 2010 12:31 PM  Follow-up for Phone Call        okay #30 x 3 Follow-up by: Cindee Salt MD,  April 02, 2010 1:10 PM  Additional Follow-up for Phone Call Additional follow up Details #1::        Rx faxed to pharmacy Additional Follow-up by: DeShannon Smith CMA Duncan Dull),  April 02, 2010 1:20 PM    Prescriptions: ZOLPIDEM TARTRATE 10 MG TABS (ZOLPIDEM TARTRATE) 1/2 -1 tab at bedtime as needed to help sleep  #30 x 3   Entered by:   Mervin Hack CMA (AAMA)   Authorized by:   Cindee Salt MD   Signed by:   Mervin Hack CMA (AAMA) on 04/02/2010   Method used:   Handwritten   RxID:   [id number]

## 2010-06-02 NOTE — Assessment & Plan Note (Signed)
Summary: F/U DLO   Vital Signs:  Patient profile:   60 year old female Weight:      168 pounds Temp:     98.3 degrees F oral Pulse rate:   64 / minute Pulse rhythm:   regular BP sitting:   118 / 70  (left arm) Cuff size:   regular  Vitals Entered By: Mervin Hack CMA Duncan Dull) (July 16, 2009 11:13 AM) CC: follow-up visit   History of Present Illness: DOing fairly well Still occ gets palpitations Has 30 day heart monitor still on for 1 more day No chest pain but occ tightness---generally if stressed or rushing Just started more exercise--walking classes, [employer] classes  Follows with Dr Russella Dar for abd issues still with reflux despite the meds Rare nausea  Still occ migraines but less common uses zomig and occ phenergan and these are generally effective Occ can use OTC meds  Ongoing sleep problems despite the zolpidem doesn't sleep more than a few hours at a time stays tired during the day  Lots of stressors Lost job 6/10 Intermittent anxiety symptoms--some worse lately May have spells of depression as long as a week Then able to pull herself out  Allergies: No Known Drug Allergies  Past History:  Past medical, surgical, family and social histories (including risk factors) reviewed for relevance to current acute and chronic problems.  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 2003 turbinate sinus surgery 1998 Hysterectomy, partial, vaginal: for mennorhagia.Marland Kitchenno cancer..no cervix Tonsillectomy  Family History: Reviewed history from 02/05/2009 and no changes required. 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   Neck:  supple, no masses, no thyromegaly, no carotid bruits, and no cervical lymphadenopathy.   Lungs:  normal respiratory effort and normal breath sounds.   Heart:  normal rate, regular rhythm, no murmur, and no gallop.   Abdomen:  soft and non-tender.   Msk:  Nodule on distal DIP of right 3rd finger no active synovitis Extremities:  no edema Psych:  normally interactive, good eye contact, and not anxious appearing.     Impression & Recommendations:  Problem # 1:  INSOMNIA, CHRONIC (ICD-307.42) Assessment Unchanged not better zolpidem has not helped so we will stop will try trazodone--esp since she has chronic mood issue (proabaly dysthymia)  Problem # 2:  GERD (ICD-530.81) Assessment: Unchanged oongoing despite meds she will set up follow up with Dr Russella Dar  The following medications were removed from the medication list:    Protonix 40 Mg Tbec (Pantoprazole sodium) .Marland Kitchen... 1 tab by mouth daily    Dexilant 30 Mg Cpdr (Dexlansoprazole) .Marland Kitchen... Take 1 by mouth once daily Her updated medication list for this problem includes:    Dexilant 60 Mg Cpdr (Dexlansoprazole) .Marland Kitchen... Take 1 by mouth once daily  Problem # 3:  PALPITATIONS (ICD-785.1) Assessment: Unchanged still occ has reassuring stress test wearing monitor now from cardiologist  Problem # 4:  MIGRAINE, COMMON (ICD-346.10) Assessment: Unchanged will renew meds  The following medications were removed from the medication list:    Diclofenac Sodium 75 Mg Tbec (Diclofenac sodium) .Marland Kitchen... 1 tab by mouth two times a day Her updated medication list for this  problem includes:    Zomig Zmt 2.5 Mg Tbdp (Zolmitriptan) .Marland Kitchen... As directed  Problem # 5:  HYPERTENSION (ICD-401.9) Assessment: Unchanged good control could change to losartan if needed  Her updated medication list for this problem includes:    Atacand 16 Mg Tabs (Candesartan cilexetil) .Marland Kitchen... Take 1 tablet by mouth once a day  BP today: 118/70 Prior BP: 124/80 (06/23/2009)  Prior 10 Yr Risk Heart Disease: Not enough information (12/18/2008)  Labs Reviewed: K+: 4.2 (06/23/2009) Creat: : 0.8 (06/23/2009)   Chol: 167 (12/18/2008)   HDL: 56.70 (12/18/2008)   LDL: 94 (12/18/2008)   TG: 84.0 (12/18/2008)  Problem # 6:  OSTEOARTHRITIS (ICD-715.90) Assessment: Unchanged mild symptoms  The following medications were removed from the medication list:    Diclofenac Sodium 75 Mg Tbec (Diclofenac sodium) .Marland Kitchen... 1 tab by mouth two times a day  Complete Medication List: 1)  Loratadine 10 Mg Tabs (Loratadine) .... Take 1 tablet by mouth once a day 2)  Pravastatin Sodium 20 Mg Tabs (Pravastatin sodium) .... Take 1 tablet by mouth at bedtime 3)  Singulair 10 Mg Tabs (Montelukast sodium) .... Take 1 tab by mouth at bedtime 4)  Lorazepam 1 Mg Tabs (Lorazepam) .... Once daily as needed 5)  Atacand 16 Mg Tabs (Candesartan cilexetil) .... Take 1 tablet by mouth once a day 6)  Astepro 0.15 % Soln (Azelastine hcl) .... 2 sprays per nostril two times a day 7)  Systane Ultra 0.4-0.3 % Soln (Polyethyl glycol-propyl glycol) .... Two drops in each eye once daily 8)  Vagifem 25 Mcg Tabs (Estradiol) .... As needed 9)  Vitamin D (ergocalciferol) 50000 Unit Caps (Ergocalciferol) .... Take one by mouth once a week 10)  Proair Hfa 108 (90 Base) Mcg/act Aers (Albuterol sulfate) .... 2 puff inh q6 hours as needed wheeze, shortness of breath 11)  Zomig Zmt 2.5 Mg Tbdp (Zolmitriptan) .... As directed 12)  Promethazine Hcl 25 Mg Tabs (Promethazine hcl) .Marland Kitchen.. 1 by mouth up to three times a day for nausea 13)   Cyclobenzaprine Hcl 10 Mg Tabs (Cyclobenzaprine hcl) .... 1/2 to 1  tab by mouth at bedtime as needed muscle spasm 14)  Dexilant 60 Mg Cpdr (Dexlansoprazole) .... Take 1 by mouth once daily 15)  Trazodone Hcl 100 Mg Tabs (Trazodone hcl) .Marland Kitchen.. 1-2 tabs by mouth at bedtime to help sleep  Patient Instructions: 1)  Please schedule a follow-up appointment in 2 months.  2)  Please reschedule back x-rays ordered last month Prescriptions: TRAZODONE HCL 100 MG TABS (TRAZODONE HCL) 1-2 tabs by mouth at bedtime to help sleep  #90 x 3   Entered and Authorized by:   Cindee Salt MD   Signed by:   Cindee Salt MD on 07/16/2009   Method used:   [facility]ron[facility] to        [facility]       [address]       [place], [place]  [address]       Ph: [phone]       Fax: [phone]   RxID:   [id number]   Current Allergies (reviewed today): No known allergies

## 2010-06-02 NOTE — Procedures (Signed)
Summary: Modified Barium Swallow   Modified Barium Swallow  Procedure date:  04/07/2010  Findings:      normal:   MODIFIED BARIUM SWALLOW STUDY SWALLOW HISTORY  06Dec11 12:00pm BD  SWALLOW HX    MBSS Outpatient                          Yes        06Dec11 12:20pm BD    Therapy Diagnosis Impairment             Dysphagia  06Dec11 12:20pm BD    Dx/Referral Reason/Past Medical Hx       Yes        06Dec11 12:20pm BD           Comment: Christina Deleon arrives for an outpatient MBS after           experiencing a severe choking episode approx 3 months ago in           which the heimlich maneuver was performed and pt was           subsequently admitted to the ED due to coughing up POs and           suspicion of aspiration pna.  Sins that time pt has felt that           food "gets stuck" and often coughs with POs.  PMH: GERD    Verbal consent obtained for MBSS         Patient    06Dec11 12:20pm BD    Pt FollowsDirections/CompleteStrategies  Yes        06Dec11 12:20pm BD    Dentition condition                      Adequate   06Dec11 12:20pm BD    Postural control adequate for testing    Yes        06Dec11 12:20pm BD    Current Diet                             Regular    06Dec11 12:20pm BD    Liquids                                  Thin       06Dec11 12:20pm BD    Oral Motor Evaluation                    [facility]        06Dec11 12:20pm BD    Penetration/Aspiration Scale Guide       Yes        06Dec11 12:20pm BD           Comment: 1 = material does not enter airway           2 = material enters airway, remains ABOVE vocal cords           then ejected out           3 = material enters airway, remains ABOVE cords and not           ejected out           4 = material enters airway, CONTACTS cords then ejected           out  5 = material enters airway, CONTACTS cords and not ejected           out           6 = material enters airway, passes BELOW cords then ejected           out           7 =  material enters airway, passes BELOW cords and not           ejected out despite cough attempt by patient           8 = material enters airway, passes BELOW cords without           attempt by patient to eject out(silent aspiration)  SWALLOW PHASE THIN/TSP  06Dec11 12:00pm BD  ORAL THIN TEASPOON    Not tested-TT                            Yes        06Dec11 12:20pm BD  ORAL PHARYNGEAL THIN TEASPOON  PHARYNGEAL THIN TEASPOON  SWALLOW PHASE THIN/CUP  06Dec11 12:00pm BD  ORAL THIN CUP    WFL-TC                                   Yes        06Dec11 12:21pm BD  ORAL PHARYNGEAL THIN CUP  PHARYNGEAL THIN CUP    Penetration/Aspiration Scale-TC          1          06Dec11 12:22pm BD  SWALLOW PHASE THIN/STRAW  06Dec11 12:00pm BD  ORAL THIN STRAW  ORAL PHARYNGEAL THIN STRAW    Swallow initiation location-TS           PyriformSi 06Dec11 12:21pm BD  PHARYNGEAL THIN STRAW    Penetration/Aspiration Scale-TS          1          06Dec11 12:21pm BD  SWALLOW PHASE NECTAR/TSP  06Dec11 12:00pm BD  ORAL NECTAR TEASPOON    Not tested-NT                            Yes        06Dec11 12:22pm BD  ORAL PHARYNGEAL NECTAR TEASPOON  PHARYNGEAL NECTAR TEASPOON  SWALLOW PHASE NECTAR/CUP  06Dec11 12:00pm BD  ORAL NECTAR CUP    Not tested-[place]                            Yes        06Dec11 12:22pm BD  ORAL PHARNYGEAL NECTAR CUP  PHARYNGEAL NECTAR CUP  SWALLOW PHASE NECTAR/STRAW  06Dec11 12:00pm BD  ORAL NECTAR STRAW    Not tested-NS                            Yes        06Dec11 12:22pm BD  ORAL PHARYNGEAL NECTAR STRAW  PHARYNGEAL NECTAR STRAW  SWALLOW PHASE HONEY/TSP  06Dec11 12:00pm BD  ORAL HONEY TEASPOON    Not tested-HT                            Yes        [id number]  12:22pm BD  ORAL PHARYNGEAL HONEY TEASPOON  PHARYNGEAL HONEY TEASPOON  SWALLOW PHASE HONEY/CUP  06Dec11 12:00pm BD  ORAL HONEY CUP    Not tested-HC                            Yes        06Dec11 12:22pm BD  ORAL PHARYNGEAL HONEY  CUP  PHARYNGEAL HONEY CUP  SWALLOW PHASE PUREE  06Dec11 12:00pm BD  ORAL PUREE    WFL-P                                    Yes        06Dec11 12:22pm BD  ORAL PHARYNGEAL PUREE  PHARYNGEAL PUREE    Penetration/Aspiration Scale-P           1          06Dec11 12:22pm BD  SWALLOW PHASE MECHANICAL SOFT  06Dec11 12:00pm BD  ORAL MECHANICAL SOFT    WFL-MS                                   Yes        06Dec11 12:22pm BD  ORAL PHARYNGEAL MECHANICAL SOFT  PHARYNGEAL MECHANICAL SOFT    Penetration/Aspiration Scale-MS          1          06Dec11 12:22pm BD  SWALLOW PHASE MIXED  06Dec11 12:00pm BD  ORAL MIXED CONSISTENCY    Not tested-MC                            Yes        06Dec11 12:22pm BD  ORAL PHARYNGEAL MIXED CONSISTENCY  PHARYNGEAL MIXED CONSISTENCY  SWALLOW PHASE PILL  06Dec11 12:00pm BD  ORAL PILL    WFL-PL                                   Yes        06Dec11 12:22pm BD  ORAL PHARYNGEAL PILL  PHARYNGEAL PILL    Penetration/Aspiration Scale-PL          1          06Dec11 12:22pm BD  SWALLOW EVALUATION  06Dec11 12:00pm BD  SWALLOW EVAL    Normal UES Relaxation                    Yes        06Dec11 12:32pm BD    Esophageal Bolus Transit                 Yes        06Dec11 12:32pm BD           Comment: Esophageal transit monitored closely in this study.           Transit appeared mildly slow with solid POs, but even with           large consecutive bites solids moved past the LES without           significant difficulty.  No reflux was observed.  No           radiologist present to confirm.  MBSS RECOMMENDATIONS  Clinical Impressions/Functional Problems Yes        06Dec11 12:32pm BD           Comment: Christina Deleon exhibited normal swallow function without           any significant oropharyngeal or esophageal deficits           impacting safety with POs.  The pt was observed to have a           slightly delayed swallow response with large sips of liquids           that could  potentially lead to penetration.  The pt was           instructed to take small sips and avoid straws.  Also           educated the pt regarding avoiging dry grainy food that seems           to cause her to cough more.   Discussed the risks of           nighttime reflux with pt and damage that acid could do to           esophageal/pharyngeal and laryngeal tissues which could           result in the pts sensitivity.  The pt is recommended to           continue a regular/thin diet with little to no aspiration           risk.    Diet recommendations                     Regular    06Dec11 12:32pm BD    Liquid recommendations                   Thin       06Dec11 12:32pm BD    Swallowing therapy recommended           No         06Dec11 12:32pm BD  MBSS ASPIRATION PRECAUTIONS    Supervision needed                       None       06Dec11 12:32pm BD    No Straws                                Yes        06Dec11 12:32pm BD    RefluxPrecaution-upright during/1hr. pc  Yes        06Dec11 12:32pm BD  TREATMENT PLAN AND GOALS  06Dec11 12:00pm BD  MBSS TREATMENT PLAN/GOALS    Inpatient Treatment Plan N/A             Yes        06Dec11 12:32pm BD  MBSS MUTUAL GOALS    Mutual Goals N/A                         Yes        06Dec11 12:32pm BD  MBSS INTERVENTIONS  MBSS EDUCATION AND DISCHARGE  06Dec11 12:00pm BD  MBSS PATIENT/FAMILY EDUCATION    Recommend patient receive f/u therapy at N/A        06Dec11 12:33pm BD    Compensatory strategies provided         DemoVerbal 06Dec11 12:33pm  BD    Diet modification provided               Verbal     [id number] 12:33pm BD    Aspiration precautions provided          Verbal     06Dec11 12:33pm BD    Reflux precautions provided              Verbal     06Dec11 12:33pm BD    Verbalizes understanding of education    Pt/family  06Dec11 12:33pm BD    MBSS Goals/discharge criteria discussed* Yes        06Dec11 12:33pm BD  MBSS TIME    MBSS Therapy Time In                      1115       06Dec11 12:33pm BD    MBSS Therapy Time Out                    1145       06Dec11 12:33pm BD    MBSS Therapy Charges/minutes             MBS        06Dec11 12:33pm BD  MBSS REPORTS  MBSS REVIEW OF STUDENT COMPLETION    Appended Document: Modified Barium Swallow See impression for recommendations on small sips and avoiding straws. Basically normal study.  Appended Document: Modified Barium Swallow Pt notified of Dr. Anselm Jungling and speech pathologist recommendations on voicemial.

## 2010-06-02 NOTE — Progress Notes (Signed)
Summary: Dexilant  Medications Added DEXILANT 60 MG CPDR (DEXLANSOPRAZOLE) 1 by mouth once daily       Phone Note Call from Patient Call back at [facility] Phone [phone]   Caller: Patient Call For: Dr. Russella Dar Reason for Call: Refill Medication Summary of Call: pt tried samples of  Dexilant and Aciphex... pt liked Dexilant better and would like a rx for this... [facility] [address] Initial call taken by: Vallarie Mare,  May 14, 2009 2:53 PM  Follow-up for Phone Call        Rx was sent to pts pharmacy.  Follow-up by: Christie Nottingham CMA Duncan Dull),  May 14, 2009 2:57 PM    New/Updated Medications: DEXILANT 60 MG CPDR (DEXLANSOPRAZOLE) 1 by mouth once daily Prescriptions: DEXILANT 60 MG CPDR (DEXLANSOPRAZOLE) 1 by mouth once daily  #30 x 11   Entered by:   Christie Nottingham CMA (AAMA)   Authorized by:   Meryl Dare MD [facility]   Signed by:   Christie Nottingham CMA (AAMA) on 05/14/2009   Method used:   Electronically to        [facility] (retail)       [address]       [place], [place]  [id number]       Ph: [phone]       Fax: [phone]   RxID:   [id number]

## 2010-06-02 NOTE — Assessment & Plan Note (Signed)
Summary: reflux/sheri   History of Present Illness Visit Type: Follow-up Visit Primary GI MD: Elie Goody MD [facility] Primary Provider: Tillman Abide, MD Requesting Provider: NA Chief Complaint: feeling that there is somethng in throat History of Present Illness:   This is a 60 year old female here today with her husband, who complains of the sensation of something stuck in her throat, a recurrent cough with chest congestion and occasional coughing when swallowing. She is maintained on Dexilant daily and has recently been treated with Carafate with no improvement in symptoms. She has a history of sinus problems, and has been treated for asthma in the past. She underwent EGD and colonoscopy in November 2010 showing gastritis and small adenomatous colon.   GI Review of Systems    Reports acid reflux, heartburn, and  nausea.      Denies abdominal pain, belching, bloating, chest pain, dysphagia with liquids, dysphagia with solids, loss of appetite, vomiting, vomiting blood, weight loss, and  weight gain.      Reports anal fissure,constipation, hemorrhoids, and  rectal bleeding.     Denies black tarry stools, change in bowel habit, diarrhea, diverticulosis, fecal incontinence, heme positive stool, irritable bowel syndrome, jaundice, light color stool, liver problems, and  rectal pain.   Current Medications (verified): 1)  Loratadine 10 Mg Tabs (Loratadine) .... Take 1 Tablet By Mouth Once A Day 2)  Pravastatin Sodium 20 Mg Tabs (Pravastatin Sodium) .... Take 1 Tablet By Mouth At Bedtime 3)  Singulair 10 Mg Tabs (Montelukast Sodium) .... Take 1 Tab By Mouth At Bedtime 4)  Lorazepam 1 Mg Tabs (Lorazepam) .... Once Daily As Needed 5)  Atacand 16 Mg Tabs (Candesartan Cilexetil) .... Take 1 Tablet By Mouth Once A Day 6)  Astepro 0.15 % Soln (Azelastine Hcl) .... 2 Sprays Per Nostril Two Times A Day 7)  Vagifem 25 Mcg Tabs (Estradiol) .... As Needed 8)  Vitamin D (Ergocalciferol) 50000 Unit Caps  (Ergocalciferol) .... Take One By Mouth Once A Week 9)  Dexilant 60 Mg Cpdr (Dexlansoprazole) .... Take 1 By Mouth Once Daily 10)  Zolpidem Tartrate 10 Mg Tabs (Zolpidem Tartrate) .... 1/2 -1 Tab At Bedtime As Needed To Help Sleep 11)  Zomig Zmt 2.5 Mg Tbdp (Zolmitriptan) .... Use 1 By Mouth At The Onset of Headache 12)  Carafate 1 Gm/48ml Susp (Sucralfate) .... Take 1 Gram After Meals and At Bedtime 13)  Clindamycin Hcl 300 Mg Caps (Clindamycin Hcl) .... Take 1 By Mouth 4 Times Daily Until All Finished (Dentist Rx'd) 14)  Cyclobenzaprine Hcl 10 Mg Tabs (Cyclobenzaprine Hcl) .Marland Kitchen.. 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 2003 turbinate sinus surgery 1998 Hysterectomy, partial, vaginal: for mennorhagia.Marland Kitchenno cancer..no cervix Tonsillectomy  Family History: Reviewed history from 02/05/2009 and no changes required. father: died throat cancer, CAD MI age 37s, CVA during procedure, lung cancer Family History of CAD Female 1st degree relative <50 mother: CABG, CAD, CHF, HTNB, high chol, Aunts: DM PGM: DM, CAD MGM: CVA or MI No FH of Colon Cancer:  Social History: Reviewed history from 09/26/2009 and no changes required. Occupation: regulatory compliance specialist..office closed so                  temporarily unemployed since  6/10 Married 2 children Never Smoked Alcohol use-no Drug use-no Regular exercise-yes Diet: fruits and veggies, limited fried foods, limited water  Review of Systems       The patient complains of cough, sore throat, and voice change.         The pertinent positives and negatives are noted as above and in the HPI. All  other ROS were reviewed and were negative.   Vital Signs:  Patient profile:   60 year old female Height:      64 inches Weight:      166.13 pounds BMI:     28.62 Pulse rate:   60 / minute Pulse rhythm:   regular BP sitting:   120 / 70  (left arm) Cuff size:   regular  Vitals Entered By: June McMurray CMA Duncan Dull) (April 01, 2010 10:22 AM)  Physical Exam  General:  Well developed, well nourished, no acute distress. She is frequently clearing her throat and coughing with a raspy voice. Head:  Normocephalic and atraumatic. Eyes:  PERRLA, no icterus. Ears:  Normal auditory acuity. Mouth:  No deformity or lesions, dentition normal. Lungs:  Clear throughout to auscultation. Heart:  Regular rate and rhythm; no murmurs, rubs,  or bruits. Abdomen:  Soft, nontender and nondistended. No masses, hepatosplenomegaly or hernias noted. Normal bowel sounds. Psych:  Alert and cooperative. Normal mood and affect.  Impression & Recommendations:  Problem # 1:  COUGH (ICD-786.2) Persistent cough with throat clearing and a raspy voice. Rule out sinus disorders, rhinitis, asthma, and other problems. She may have a component of GERD, however, this does not appear to be the primary problem at this point. Continue Dexilant 60 mg q.a.m. and begin ranitidine 300 mg h.s. Continue on standard antireflux measures. Proceed with a modified barium swallow for her occasional choking with swallowing. Orders: Barium Swallow, Modified  (Modified BS) [facility]  Problem # 2:  GERD (ICD-530.81) See problem #1.  Problem # 3:  HEMATOCHEZIA (ICD-578.1) Small-volume hematochezia. Colonoscopy in November 2010 did not reveal anal or rectal pathology. Further evaluation if her symptoms persist.  Problem # 4:  PERSONAL HX COLONIC POLYPS (ICD-V12.72) Prior history of adenomatous colon polyps. Surveillance colonoscopy recommended November 2015.  Patient Instructions: 1)  Continue Dexilant  one tablet by mouth every morning and start ranitidine one tablet by mouth at bedtime.  2)  Stop Carafate.  3)  You have been referred to see Dr. Annalee Genta at [facility] on 04/14/10 at 3:30pm, please arrive at 3:15pm. 4)  Also you have been scheduled for a Modified Barium Swallow at [facility] 1st floor admitting on 04/07/10 at 11:00am.  5)  Copy sent to : Tillman Abide, MD 6)  The medication list was reviewed and reconciled.  All changed / newly prescribed medications were explained.  A complete medication list was provided to the patient / caregiver.  Prescriptions: RANITIDINE HCL 300 MG TABS (RANITIDINE HCL) one tablet by mouth at bedtime  #30 x 11   Entered by:   Christie Nottingham CMA (AAMA)   Authorized by:   Meryl Dare MD [facility]   Signed by:   Christie Nottingham CMA (AAMA) on 04/01/2010   Method used:   Electronically to        [facility] (retail)       [address]       [place], [place]  [id number]       Ph: [phone]  Fax: [phone]   RxID:   [id number]

## 2010-06-02 NOTE — Letter (Signed)
Summary: Viann Fish, MD  Viann Fish, MD   Imported By: Lanelle Bal 07/25/2009 12:53:08  _____________________________________________________________________  External Attachment:    Type:   Image     Comment:   External Document  Appended Document: Viann Fish, MD Holter benign Relatively recent echo and stress test benign No explanation for fatigue follow up as needed

## 2010-06-02 NOTE — Progress Notes (Signed)
Summary: Medication-Dexilant-90 day Supply   Phone Note From Pharmacy   Caller: [facility] [phone] Call For: Mike Gip  Summary of Call: Wants to know if they can dispense a 90 day supply of Dexiant. It is less expensive for the pt....Marland KitchenMarland KitchenRef# [id number] Initial call taken by: Karna Christmas,  October 09, 2009 11:44 AM  Follow-up for Phone Call        I faxed the 90 supply of Dexilant to [facility] on 10-09-09.  Follow-up by: Joselyn Glassman,  October 09, 2009 12:00 PM

## 2010-06-02 NOTE — Assessment & Plan Note (Signed)
Summary: NURSE VISIT/[facility]   Nurse Visit   Allergies: No Known Drug Allergies Laboratory Results   Blood Tests   Date/Time Received: June 20, 2009 3:47 PM Date/Time Reported: June 20, 2009 3:47 PM  Glucose (random): 103 mg/dL   (Normal Range: 29-528)

## 2010-06-02 NOTE — Progress Notes (Signed)
Summary: refill request for ambien  Phone Note Refill Request Message from:  Fax from Pharmacy  Refills Requested: Medication #1:  ZOLPIDEM TARTRATE 10 MG TABS Take 1 tab by mouth at bedtime as needed   Last Refilled: 05/02/2009 Faxed request from [facility], phone [phone].  Initial call taken by: Lowella Petties CMA,  June 17, 2009 9:16 AM  Follow-up for Phone Call        printed to give to patient b/c she is her for appt Follow-up by: Benny Lennert CMA Duncan Dull),  June 17, 2009 9:37 AM    Prescriptions: ZOLPIDEM TARTRATE 10 MG TABS (ZOLPIDEM TARTRATE) Take 1 tab by mouth at bedtime as needed  #30 x 0   Entered by:   Benny Lennert CMA (AAMA)   Authorized by:   Kerby Nora MD   Signed by:   Benny Lennert CMA (AAMA) on 06/17/2009   Method used:   Print then Give to Patient   RxID:   [id number] ZOLPIDEM TARTRATE 10 MG TABS (ZOLPIDEM TARTRATE) Take 1 tab by mouth at bedtime as needed  #30 x 0   Entered and Authorized by:   Kerby Nora MD   Signed by:   Kerby Nora MD on 06/17/2009   Method used:   Telephoned to ...       [facility] (mail-order)       [address] [place], [place]  [address]       Ph: [phone]       Fax: [phone]   RxID:   [id number]

## 2010-06-02 NOTE — Progress Notes (Signed)
Summary: Hiccups and her heart skips a beat   Phone Note Call from Patient Call back at Home Phone [phone] Call back at [facility] [phone]    Caller: Dyke Brackett is authorized to speak to [place] if she doesnt answer Call For: Dr Russella Dar Reason for Call: Talk to Nurse Summary of Call: HIccups and feels like her heart is skipping a beat so she scheduled an appoinment for Cardiologist for tomorrow. Has heard that this can be acid reflux and wonders if she should try to see Dr Russella Dar today? Initial call taken by: Leanor Kail [facility],  June 18, 2009 12:28 PM  Follow-up for Phone Call        Patient  was scheduled to see a Cardiologist for palpitations.  Patient  wanted to know if GERD could cause palpitations.  Patient  advised no and she should keep her appointment for the cardiologist tomorrow.  Dr Ermalene Searing changed her to protonix, due to reflux.  She will call back if there is no improvment with her reflux after trying protonix. Follow-up by: Darcey Nora RN, CGRN,  June 18, 2009 1:38 PM

## 2010-06-02 NOTE — Assessment & Plan Note (Signed)
Summary: FU/ FROM FRI/DLO   Vital Signs:  Patient profile:   60 year old female Height:      64 inches Weight:      166.38 pounds BMI:     28.66 Temp:     98.2 degrees F oral Pulse rate:   80 / minute Pulse rhythm:   regular BP sitting:   124 / 80  (left arm) Cuff size:   regular  Vitals Entered By: Delilah Shan CMA Duncan Dull) (June 23, 2009 9:49 AM) CC: Follow-up visit from Friday   History of Present Illness: 60 yo female new to me here for f/u hypoglyemia. Upset because she feels that her symptoms are not getting worked up appropriately.  [facility], showed CBG of 44. At that time feel dizzy, nauseated, sweaty, and "just bad." Says she was told here it was probably lab error. Recheck on Friday was 103.  No longer feels sweaty but still having nausea, with no vomiting. Generalized abdominal pain after eating, gets full easily.  Has had palpitations and feeling sweaty for a while, currently wearing a holter monitor for cards.  Had colonsocopy and endoscopy 3 months ago, got a good report.    Current Medications (verified): 1)  Loratadine 10 Mg Tabs (Loratadine) .... Take 1 Tablet By Mouth Once A Day 2)  Pravastatin Sodium 20 Mg Tabs (Pravastatin Sodium) .... Take 1 Tablet By Mouth At Bedtime 3)  Singulair 10 Mg Tabs (Montelukast Sodium) .... Take 1 Tab By Mouth At Bedtime 4)  Lorazepam 1 Mg Tabs (Lorazepam) .... Once Daily As Needed 5)  Atacand 16 Mg Tabs (Candesartan Cilexetil) .... Take 1 Tablet By Mouth Once A Day 6)  Zolpidem Tartrate 10 Mg Tabs (Zolpidem Tartrate) .... Take 1 Tab By Mouth At Bedtime As Needed 7)  Astepro 0.15 % Soln (Azelastine Hcl) .... 2 Sprays Per Nostril Two Times A Day 8)  Systane Ultra 0.4-0.3 % Soln (Polyethyl Glycol-Propyl Glycol) .... Two Drops in Each Eye Once Daily 9)  Vagifem 25 Mcg Tabs (Estradiol) .... As Needed 10)  Anusol-Hc 2.5 % Crea (Hydrocortisone) .... Apply To Affected Area Two Times A Day 11)   Vitamin D (Ergocalciferol) 50000 Unit Caps (Ergocalciferol) .... Take One By Mouth Once A Week 12)  Proair Hfa 108 (90 Base) Mcg/act Aers (Albuterol Sulfate) .... 2 Puff Inh Q6 Hours As Needed Wheeze, Shortness of Breath 13)  Zomig Zmt 2.5 Mg Tbdp (Zolmitriptan) .... As Directed 14)  Promethazine Hcl 25 Mg Tabs (Promethazine Hcl) .Marland Kitchen.. 1 By Mouth Up To Three Times A Day For Nausea 15)  Protonix 40 Mg Tbec (Pantoprazole Sodium) .Marland Kitchen.. 1 Tab By Mouth Daily 16)  Diclofenac Sodium 75 Mg Tbec (Diclofenac Sodium) .Marland Kitchen.. 1 Tab By Mouth Two Times A Day 17)  Cyclobenzaprine Hcl 10 Mg Tabs (Cyclobenzaprine Hcl) .... 1/2 To 1  Tab By Mouth At Bedtime As Needed Muscle Spasm  Allergies (verified): No Known Drug Allergies  Review of Systems      See HPI General:  Complains of fatigue and malaise; denies weight loss. Eyes:  Denies blurring. ENT:  Denies difficulty swallowing. CV:  Denies chest pain or discomfort. Resp:  Denies shortness of breath. GI:  Complains of abdominal pain, loss of appetite, and nausea; denies bloody stools, change in bowel habits, dark tarry stools, diarrhea, excessive appetite, gas, indigestion, vomiting, and yellowish skin color. Derm:  Denies rash.  Physical Exam  General:  Well-developed,well-nourished,in no acute distress; alert,appropriate and cooperative throughout examination  Mouth:  MMM Lungs:  Normal respiratory effort, chest expands symmetrically. Lungs are clear to auscultation, no crackles or wheezes. Heart:  Normal rate and regular rhythm. S1 and S2 normal without gallop, murmur, click, rub or other extra sounds. Abdomen:  epigastrum ttp, no rebound,no hepatomegaly and no splenomegaly.   + BS Extremities:  no edema Psych:  normal affect, talkative and pleasant    Impression & Recommendations:  Problem # 1:  HYPOGLYCEMIA (ICD-251.2) Assessment New Resolved, reassurance provided.  Given other symptoms she is having, will recheck BMET and hepatic panel.  She wants  to switch to Dr. Alphonsus Sias. Orders: Venipuncture (40347) TLB-BMP (Basic Metabolic Panel-BMET) (80048-METABOL) TLB-Hepatic/Liver Function Pnl (80076-HEPATIC) Radiology Referral (Radiology)  Problem # 2:  ABDOMINAL PAIN, GENERALIZED (ICD-789.07) Assessment: New Given that this pain is aggrevated by eating, will get an ultrasound to rule out gall bladder pathology.  Complete Medication List: 1)  Loratadine 10 Mg Tabs (Loratadine) .... Take 1 tablet by mouth once a day 2)  Pravastatin Sodium 20 Mg Tabs (Pravastatin sodium) .... Take 1 tablet by mouth at bedtime 3)  Singulair 10 Mg Tabs (Montelukast sodium) .... Take 1 tab by mouth at bedtime 4)  Lorazepam 1 Mg Tabs (Lorazepam) .... Once daily as needed 5)  Atacand 16 Mg Tabs (Candesartan cilexetil) .... Take 1 tablet by mouth once a day 6)  Zolpidem Tartrate 10 Mg Tabs (Zolpidem tartrate) .... Take 1 tab by mouth at bedtime as needed 7)  Astepro 0.15 % Soln (Azelastine hcl) .... 2 sprays per nostril two times a day 8)  Systane Ultra 0.4-0.3 % Soln (Polyethyl glycol-propyl glycol) .... Two drops in each eye once daily 9)  Vagifem 25 Mcg Tabs (Estradiol) .... As needed 10)  Anusol-hc 2.5 % Crea (Hydrocortisone) .... Apply to affected area two times a day 11)  Vitamin D (ergocalciferol) 50000 Unit Caps (Ergocalciferol) .... Take one by mouth once a week 12)  Proair Hfa 108 (90 Base) Mcg/act Aers (Albuterol sulfate) .... 2 puff inh q6 hours as needed wheeze, shortness of breath 13)  Zomig Zmt 2.5 Mg Tbdp (Zolmitriptan) .... As directed 14)  Promethazine Hcl 25 Mg Tabs (Promethazine hcl) .Marland Kitchen.. 1 by mouth up to three times a day for nausea 15)  Protonix 40 Mg Tbec (Pantoprazole sodium) .Marland Kitchen.. 1 tab by mouth daily 16)  Diclofenac Sodium 75 Mg Tbec (Diclofenac sodium) .Marland Kitchen.. 1 tab by mouth two times a day 17)  Cyclobenzaprine Hcl 10 Mg Tabs (Cyclobenzaprine hcl) .... 1/2 to 1  tab by mouth at bedtime as needed muscle spasm  Other Orders: TLB-CBC Platelet  - w/Differential (85025-CBCD) TLB-TSH (Thyroid Stimulating Hormone) (42595-GLO)  Patient Instructions: 1)  NIce to meet you, Christina Deleon. 2)  Please stop by to see [facility] on your way out.  Current Allergies (reviewed today): No known allergies

## 2010-06-02 NOTE — Progress Notes (Signed)
Summary: unable to take trazadone  Phone Note Call from Patient Call back at Home Phone [phone]   Caller: Patient Summary of Call: Pt was given script for trazadone for sleep but she says she can't  take this, it causes headaches and she has a hx of migraines.  She had ambien before and wants to go back to that.  [facility] send [facility].  OK to wait till next week. Initial call taken by: Lowella Petties CMA,  July 24, 2009 10:18 AM  Follow-up for Phone Call        okay to send zolpidem 10mg  1/2 -1 tab at bedtime as needed to help sleep #30 x 1  Please let her know that if she uses this every day, they will stop working fairly soon. If she limits them to 1-2 per week, they may continue to be effective for a long time Cindee Salt MD  July 24, 2009 5:00 PM   Additional Follow-up for Phone Call Additional follow up Details #1::        Rx Called In, Spoke with patient and advised results.  Additional Follow-up by: Mervin Hack CMA Duncan Dull),  July 25, 2009 9:16 AM    New/Updated Medications: ZOLPIDEM TARTRATE 10 MG TABS (ZOLPIDEM TARTRATE) 1/2 -1 tab at bedtime as needed to help sleep Prescriptions: ZOLPIDEM TARTRATE 10 MG TABS (ZOLPIDEM TARTRATE) 1/2 -1 tab at bedtime as needed to help sleep  #30 x 1   Entered by:   Mervin Hack CMA (AAMA)   Authorized by:   Cindee Salt MD   Signed by:   Mervin Hack CMA (AAMA) on 07/25/2009   Method used:   Telephoned to ...       [facility] (retail)       [address]       [place], [place]  [address]       Ph: [phone]       Fax: [phone]   RxID:   [id number]

## 2010-06-02 NOTE — Progress Notes (Signed)
Summary: Triage   Phone Note Call from Patient Call back at Home Phone [phone]   Caller: Patient Call For: Dr. Russella Dar Reason for Call: Talk to Nurse Summary of Call: pt. has an appt. on 11-10-09. Having burning in esophagus and stomach. Taking Dexilant and it is not working as good as it use too. Initial call taken by: Karna Christmas,  Sep 23, 2009 10:08 AM  Follow-up for Phone Call        Patient  has tried dexilant and Protonix but has daily GERD stomach upset .  She has tried dietary changes and this hasn't improved her symptoms.  Patient  has an appointment with Dr Russella Dar 11/10/09, but she feels this is too far out and she wants an earlier appointment.  I have offered her an appointment today with Willette Cluster RNP , but the patient is unable to do that due to a prior commitment.  Patient  will be scheduled with Mike Gip PA for 09/30/09 9:30 Follow-up by: Darcey Nora RN, CGRN,  Sep 23, 2009 10:32 AM

## 2010-06-02 NOTE — Progress Notes (Signed)
Summary: No Showed for Physical Therapy in Feb for shoulder   Phone Note Other Incoming   Call placed to: Specialist Action Taken: Information Sent Summary of Call: Received info form [employer]..Pt no showed for Physical therapy.Marland KitchenMarland KitchenDaine Gip  Sep 01, 2009 9:59 AM  Follow-up for Phone Call        noted Follow-up by: Cindee Salt MD,  Sep 01, 2009 2:16 PM

## 2010-06-02 NOTE — Assessment & Plan Note (Signed)
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  Past Medical History: Reviewed history from 07/16/2009 and no changes required. 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 2003 turbinate sinus surgery 1998 Hysterectomy, partial, vaginal: for mennorhagia.Marland Kitchenno cancer..no cervix Tonsillectomy  Family History: Reviewed history from 02/05/2009 and no changes required. father: died throat cancer, CAD MI age 68s, CVA during procedure, lung cancer Family History of CAD Female 1st degree relative <50 mother: CABG, CAD, CHF, HTNB, high chol, Aunts: DM PGM: DM, CAD MGM: CVA or MI No FH of Colon Cancer:  Social History: Occupation: Radio broadcast assistant..office  closed so                  temporarily unemployed since 6/10 Married 2 children Never Smoked Alcohol use-no Drug use-no Regular exercise-yes Diet: fruits and veggies, limited fried foods, limited water  Review of Systems       early cataracts appetite is okay--eats the wrong things though No weight gain ongoing stomach issues---has appt with GI PA next week Had ER visit due to choking spell on granola --then had bad cough. Given antibiotic and steroid Went to ENT in follow up of right ear trouble after insect went in  Physical Exam  General:  alert and normal appearance.   Psych:  normally interactive, good eye contact, and dysphoric affect.     Impression & Recommendations:  Problem # 1:  INSOMNIA, CHRONIC (ICD-307.42) Assessment Unchanged ongoing problems advised to use zolpidem but not nightly  Problem # 2:  DYSTHYMIC DISORDER (ICD-300.4) Assessment: Comment Only  not clear that meds are indicated will refer for counselling  Orders: Psychology Referral (Psychology)  Complete Medication List: 1)  Loratadine 10 Mg Tabs (Loratadine) .... Take 1 tablet by mouth once a day 2)  Pravastatin Sodium 20 Mg Tabs (Pravastatin sodium) .... Take 1 tablet by mouth at bedtime 3)  Singulair 10 Mg Tabs (Montelukast sodium) .... Take 1 tab by mouth at bedtime 4)  Lorazepam 1 Mg Tabs (Lorazepam) .... Once daily as needed 5)  Atacand 16 Mg Tabs (Candesartan cilexetil) .... Take 1 tablet by mouth once a day 6)  Astepro 0.15 % Soln (Azelastine hcl) .Marland KitchenMarland KitchenMarland Kitchen  2 sprays per nostril two times a day 7)  Systane Ultra 0.4-0.3 % Soln (Polyethyl glycol-propyl glycol) .... Two drops in each eye once daily 8)  Vagifem 25 Mcg Tabs (Estradiol) .... As needed 9)  Vitamin D (ergocalciferol) 50000 Unit Caps (Ergocalciferol) .... Take one by mouth once a week 10)  Dexilant 60 Mg Cpdr (Dexlansoprazole) .... Take 1 by mouth once daily 11)  Zolpidem Tartrate 10 Mg Tabs (Zolpidem tartrate) .... 1/2 -1 tab at  bedtime as needed to help sleep 12)  Zomig Zmt 2.5 Mg Tbdp (Zolmitriptan) .... Use 1 by mouth at the onset of headache  Patient Instructions: 1)  Please schedule a follow-up appointment in 6 months .  2)  Referral Appointment Information 3)  Day/Date: 4)  Time: 5)  Place/MD: 6)  Address: 7)  Phone/Fax: 8)  Patient given appointment information. Information/Orders faxed/mailed.  Current Allergies (reviewed today): TRAZODONE HCL (TRAZODONE HCL)

## 2010-06-02 NOTE — Progress Notes (Signed)
Summary: wants to referral to cardiologist  Phone Note Call from Patient Call back at [phone]   Caller: Patient Call For: Kerby Nora MD Summary of Call: Pt was seen this morning and she says she is getting worse.  She says the episodes she has been having are getting worse.  She wants referral to cardiologist.  She wants to see someone in [place] Initial call taken by: Lowella Petties CMA,  June 17, 2009 3:49 PM  Follow-up for Phone Call        Sent in referral to cardiologist.  Have her contact Dr. Russella Dar if reflux not improving.  Follow-up by: Kerby Nora MD,  June 17, 2009 5:41 PM  Additional Follow-up for Phone Call Additional follow up Details #1::        Patient Advised.  Additional Follow-up by: Delilah Shan CMA Duncan Dull),  June 17, 2009 5:49 PM

## 2010-06-02 NOTE — Assessment & Plan Note (Signed)
Summary: BACK,NECK,L SHOULDER PAIN,HA/CLE   Vital Signs:  Patient profile:   60 year old female Height:      64 inches Weight:      166.25 pounds BMI:     28.64 Temp:     98.5 degrees F oral Pulse rate:   88 / minute Pulse rhythm:   regular BP sitting:   122 / 80  (left arm) Cuff size:   regular  Vitals Entered By: Delilah Shan CMA Duncan Dull) (June 04, 2009 9:05 AM) CC: Back, neck and left shoulder pain   History of Present Illness: Has history of DDD, chronic low back and chronic neck pain. No past back or neck surgeries. On mobic...but makes her feel like HR increase.  helps minimally with the pain. Uses hydrocodone of husbands a=or her old Rx occ. Has had steroid injections in low back...Marland Kitchenyears ago.   She has had gradual worsening of low back pain..cannot sleep at night. Left buttock pain. Occ B leg tingling,  occ left leg weakness  New Left shoulder pain x 3 weeks ago..cannot abduct wothout pain, pain with internal and external rotation.  OCc shooting pain down left arm. No injury, fall  Does have chronic neck stiffness, and tenderness in trapezius on left.   Problems Prior to Update: 1)  Gastroenteritis  (ICD-558.9) 2)  Constipation  (ICD-564.00) 3)  Personal Hx Colonic Polyps  (ICD-V12.72) 4)  Asthma, Persistent, Mild  (ICD-493.90) 5)  Chest Pain, Atypical  (ICD-786.59) 6)  Dizziness  (ICD-780.4) 7)  Family History of Cad Female 1st Degree Relative <50  (ICD-V17.3) 8)  Insomnia, Chronic  (ICD-307.42) 9)  Allergic Rhinitis  (ICD-477.9) 10)  Colonic Polyps, Benign  (ICD-211.3) 11)  Migraine, Common  (ICD-346.10) 12)  Hypertension  (ICD-401.9) 13)  Hyperlipidemia  (ICD-272.4) 14)  Gerd  (ICD-530.81) 15)  Degenerative Disc Disease  (ICD-722.6) 16)  Neck Pain, Chronic  (ICD-723.1) 17)  Low Back Pain, Chronic  (ICD-724.2) 18)  Osteoarthritis  (ICD-715.90)  Current Medications (verified): 1)  Loratadine 10 Mg Tabs (Loratadine) .... Take 1 Tablet By Mouth Once A  Day 2)  Pravastatin Sodium 20 Mg Tabs (Pravastatin Sodium) .... Take 1 Tablet By Mouth At Bedtime 3)  Singulair 10 Mg Tabs (Montelukast Sodium) .... Take 1 Tab By Mouth At Bedtime 4)  Lorazepam 1 Mg Tabs (Lorazepam) .... Once Daily As Needed 5)  Atacand 16 Mg Tabs (Candesartan Cilexetil) .... Take 1 Tablet By Mouth Once A Day 6)  Zolpidem Tartrate 10 Mg Tabs (Zolpidem Tartrate) .... Take 1 Tab By Mouth At Bedtime As Needed 7)  Astepro 0.15 % Soln (Azelastine Hcl) .... 2 Sprays Per Nostril Two Times A Day 8)  Systane Ultra 0.4-0.3 % Soln (Polyethyl Glycol-Propyl Glycol) .... Two Drops in Each Eye Once Daily 9)  Vagifem 25 Mcg Tabs (Estradiol) .... As Needed 10)  Anusol-Hc 2.5 % Crea (Hydrocortisone) .... Apply To Affected Area Two Times A Day 11)  Vitamin D (Ergocalciferol) 50000 Unit Caps (Ergocalciferol) .... Take One By Mouth Once A Week 12)  Proair Hfa 108 (90 Base) Mcg/act Aers (Albuterol Sulfate) .... 2 Puff Inh Q6 Hours As Needed Wheeze, Shortness of Breath 13)  Zomig Zmt 2.5 Mg Tbdp (Zolmitriptan) .... As Directed 14)  Promethazine Hcl 25 Mg Tabs (Promethazine Hcl) .Marland Kitchen.. 1 By Mouth Up To Three Times A Day For Nausea 15)  Dexilant 60 Mg Cpdr (Dexlansoprazole) .Marland Kitchen.. 1 By Mouth Once Daily 16)  Diclofenac Sodium 75 Mg Tbec (Diclofenac Sodium) .Marland Kitchen.. 1 Tab By  Mouth Two Times A Day 17)  Cyclobenzaprine Hcl 10 Mg Tabs (Cyclobenzaprine Hcl) .... 1/2 To 1  Tab By Mouth At Bedtime As Needed Muscle Spasm  Allergies (verified): No Known Drug Allergies  Past History:  Past medical, surgical, family and social histories (including risk factors) reviewed, and no changes noted (except as noted below).  Past Medical History: Reviewed history from 02/04/2009 and no changes required. 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 2003  turbinate sinus surgery 1998 Hysterectomy, partial, vaginal: for mennorhagia.Marland Kitchenno cancer..no cervix Tonsillectomy  Family History: Reviewed history from 02/05/2009 and no changes required. father: died throat cancer, CAD MI age 14s, CVA during procedure, lung cancer Family History of CAD Female 1st degree relative <50 mother: CABG, CAD, CHF, HTNB, high chol, Aunts: DM PGM: DM, CAD MGM: CVA or MI No FH of Colon Cancer:  Social History: Reviewed history from 02/05/2009 and no changes required. 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 CV:  Denies chest pain or discomfort. Resp:  Denies shortness of breath. GI:  Denies abdominal pain. GU:  Denies dysuria and incontinence.  Physical Exam  General:  Well-developed,well-nourished,in no acute distress; alert,appropriate and cooperative throughout examination Mouth:  MMM Neck:  no carotid bruit or thyromegaly no cervical or supraclavicular lymphadenopathy  Lungs:  Normal respiratory effort, chest expands symmetrically. Lungs are clear to auscultation, no crackles or wheezes. Heart:  Normal rate and regular rhythm. S1 and S2 normal without gallop, murmur, click, rub or other extra sounds. Msk:  ttp over B trapezius, no central vertebral ttp, neg Spurling, decrease neck ROM. No lumbar vertebral ttp, but ttp in left paraspinous muscles and left sciatic notch, neg SLR  subactromial ttp left shoulder and , pos Neer's, empty can, full strengthin rotator cuff but limited some from pain, neg drop arm Pulses:  R and L posterior tibial pulses are full and equal bilaterally  Extremities:  no edema  Neurologic:  No cranial nerve deficits  noted. Station and gait are normal. DTRs are symmetrical throughout. Sensory, motor and coordinative functions appear intact.   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 Follow up in 2 weeks..consider films and steroid injection referral if not better at that time.   Orders: Physical Therapy Referral (PT)  Problem # 2:  LOW BACK PAIN, CHRONIC (ICD-724.2)  No clear radiculopathy. Acute on chronic flare.  Info given on low back stretching, will refer to rehab.  NSAIDs, heat, muscle relaxant.  Follow up in 2 weeks..consider films and steroid injection referral if not better at that time.  The following medications were removed from the medication list:    Mobic 15 Mg Tabs (Meloxicam) .Marland Kitchen... 1 tab by mouth daily as needed pain Her updated medication list for this problem includes:    Diclofenac Sodium 75 Mg Tbec (Diclofenac sodium) .Marland Kitchen... 1 tab by mouth two times a day    Cyclobenzaprine Hcl 10 Mg Tabs (Cyclobenzaprine hcl) .Marland Kitchen... 1/2 to 1  tab by mouth at bedtime as needed muscle spasm  Orders: Physical Therapy Referral (PT)  Problem # 3:  NECK PAIN, CHRONIC (ICD-723.1)  No clear radiculopathy..more trapezius ttp.  The following medications were removed from the medication list:    Mobic 15 Mg Tabs (Meloxicam) .Marland Kitchen... 1 tab by mouth daily as needed pain Her updated medication list for this problem includes:    Diclofenac Sodium 75 Mg Tbec (Diclofenac sodium) .Marland Kitchen... 1 tab by mouth two times a day    Cyclobenzaprine Hcl 10 Mg Tabs (Cyclobenzaprine hcl) .Marland Kitchen... 1/2 to 1  tab by mouth at bedtime as needed muscle spasm  Orders: Physical Therapy Referral (PT)  Complete Medication List: 1)  Loratadine 10 Mg Tabs (Loratadine) .... Take 1 tablet by mouth once a day 2)  Pravastatin Sodium 20 Mg Tabs (Pravastatin sodium) .... Take 1 tablet by mouth at bedtime 3)  Singulair 10  Mg Tabs (Montelukast sodium) .... Take 1 tab by mouth at bedtime 4)  Lorazepam 1 Mg Tabs (Lorazepam) .... Once daily as needed 5)  Atacand 16 Mg Tabs (Candesartan cilexetil) .... Take 1 tablet by mouth once a day 6)  Zolpidem Tartrate 10 Mg Tabs (Zolpidem tartrate) .... Take 1 tab by mouth at bedtime as needed 7)  Astepro 0.15 % Soln (Azelastine hcl) .... 2 sprays per nostril two times a day 8)  Systane Ultra 0.4-0.3 % Soln (Polyethyl glycol-propyl glycol) .... Two drops in each eye once daily 9)  Vagifem 25 Mcg Tabs (Estradiol) .... As needed 10)  Anusol-hc 2.5 % Crea (Hydrocortisone) .... Apply to affected area two times a day 11)  Vitamin D (ergocalciferol) 50000 Unit Caps (Ergocalciferol) .... Take one by mouth once a week 12)  Proair Hfa 108 (90 Base) Mcg/act Aers (Albuterol sulfate) .... 2 puff inh q6 hours as needed wheeze, shortness of breath 13)  Zomig Zmt 2.5 Mg Tbdp (Zolmitriptan) .... As directed 14)  Promethazine Hcl 25 Mg Tabs (Promethazine hcl) .Marland Kitchen.. 1 by mouth up to three times a day for nausea 15)  Dexilant 60 Mg Cpdr (Dexlansoprazole) .Marland Kitchen.. 1 by mouth once daily 16)  Diclofenac Sodium 75 Mg Tbec (Diclofenac sodium) .Marland Kitchen.. 1 tab by mouth two times a day 17)  Cyclobenzaprine Hcl 10 Mg Tabs (Cyclobenzaprine hcl) .... 1/2 to 1  tab by mouth at bedtime as needed muscle spasm  Patient Instructions: 1)  Heat on low back and shoulder, neck. Stretching exercises. 2)  Start diclofenac two times a day in place of meloxicam. 3)  Muscle relaxant at night. 4)  Follow up in 2 weeks.  5)  Referral Appointment Information 6)  Day/Date: 7)  Time: 8)  Place/MD: 9)  Address: 10)  Phone/Fax: 11)  Patient given appointment information. Information/Orders faxed/mailed.  Prescriptions: CYCLOBENZAPRINE HCL 10 MG TABS (CYCLOBENZAPRINE HCL) 1/2 to 1  tab by mouth at bedtime as needed muscle spasm  #20 x 0   Entered and Authorized by:   Kerby Nora MD   Signed by:   Kerby Nora MD on 06/04/2009    Method used:   Print then Give to Patient   RxID:   [id number] DICLOFENAC SODIUM 75 MG TBEC (DICLOFENAC SODIUM) 1 tab by mouth two times a day  #30 x 0   Entered and Authorized by:   Kerby Nora MD   Signed by:   Kerby Nora MD on 06/04/2009   Method used:   Print then Give to Patient   RxID:   [id number]   Current Allergies (reviewed today): No known allergies

## 2010-06-02 NOTE — Progress Notes (Signed)
Summary: refill request for ambien  Phone Note Refill Request Message from:  Fax from Pharmacy  Refills Requested: Medication #1:  ZOLPIDEM TARTRATE 10 MG TABS 1/2 -1 tab at bedtime as needed to help sleep   Last Refilled: 09/09/2009 [facility] is on  your desk.  Initial call taken by: Lowella Petties CMA,  October 14, 2009 10:10 AM  Follow-up for Phone Call        okay #30 x 3 Follow-up by: Cindee Salt MD,  October 14, 2009 1:46 PM  Additional Follow-up for Phone Call Additional follow up Details #1::        Rx faxed to pharmacy Additional Follow-up by: DeShannon Smith CMA Duncan Dull),  October 14, 2009 2:24 PM    Prescriptions: ZOLPIDEM TARTRATE 10 MG TABS (ZOLPIDEM TARTRATE) 1/2 -1 tab at bedtime as needed to help sleep  #30 x 3   Entered by:   Mervin Hack CMA (AAMA)   Authorized by:   Cindee Salt MD   Signed by:   Mervin Hack CMA (AAMA) on 10/14/2009   Method used:   Handwritten   RxID:   [id number]

## 2010-06-02 NOTE — Miscellaneous (Signed)
Summary: No Show for Appt./[facility]  No Show for Appt./[facility]   Imported By: Maryln Gottron 10/24/2009 15:48:23  _____________________________________________________________________  External Attachment:    Type:   Image     Comment:   External Document

## 2010-06-02 NOTE — Progress Notes (Signed)
Summary: 90 day supply on Rx Singulair, Atacand, & Pravastatin  Phone Note Call from Patient Call back at Home Phone [phone]   Caller: Patient Summary of Call: Patient called and left message on voicemail stating that she needs new Rx's faxed to her mail in pharmacy, fax # [phone].  Rx's requested are: Singulair, Atacand, and Pravastatin.   90 day supply.  Patient's ID# is [id number].   Initial call taken by: Linde Gillis CMA Duncan Dull),  May 20, 2009 4:46 PM  Follow-up for Phone Call        Refill for one year.Thank you Follow-up by: Kerby Nora MD,  May 20, 2009 5:02 PM    Prescriptions: ATACAND 16 MG TABS (CANDESARTAN CILEXETIL) Take 1 tablet by mouth once a day  #90 x 3   Entered by:   Benny Lennert CMA (AAMA)   Authorized by:   Kerby Nora MD   Signed by:   Benny Lennert CMA (AAMA) on 05/20/2009   Method used:   Faxed to ...       [facility] (mail[facility]order)       [address] [place], [place]  [address]       Ph: [phone]       Fax: [phone]   RxID:   [id number] SINGULAIR 10 MG TABS (MONTELUKAST SODIUM) Take 1 tab by mouth at bedtime  #90 x 3   Entered by:   Benny Lennert CMA (AAMA)   Authorized by:   Kerby Nora MD   Signed by:   Benny Lennert CMA (AAMA) on 05/20/2009   Method used:   Faxed to ...       [facility] (mail[facility]order)       [address] [place], [place]  [address]       Ph: [phone]       Fax: [phone]   RxID:   [id number] PRAVASTATIN SODIUM 20 MG TABS (PRAVASTATIN SODIUM) Take 1 tablet by mouth at bedtime  #90 x 3   Entered by:   Benny Lennert CMA (AAMA)   Authorized by:   Kerby Nora MD   Signed by:   Benny Lennert CMA (AAMA) on 05/20/2009   Method used:   Faxed to ...       [facility] (mail[facility]order)       [address] [place], [place]  [address]       Ph: [phone]       Fax: [phone]   RxID:   [id number]

## 2010-06-02 NOTE — Assessment & Plan Note (Signed)
Summary: 6 MONTH FOLLOW UP/[facility]   Vital Signs:  Patient profile:   60 year old female Weight:      166 pounds Temp:     98.4 degrees F oral Pulse rate:   64 / minute Pulse rhythm:   regular BP sitting:   122 / 80  (left arm) Cuff size:   regular  Vitals Entered By: Mervin Hack CMA Duncan Dull) (March 17, 2010 10:46 AM) CC: 6 month follow-up   History of Present Illness: Having migraine headaches---worsening over the past several months had been once a month--now twice a week Neck is painful --starts in occiput and moves up head. If she can't abort, it goes to a migraine uses zomig but not working as well now (takes longer to work) [facility] has helped at times  Never went for psychology evaluation Too much going on---80 year old mom having issues and multiple other things Still no work available Occ feels down but no persistent times. Has made provisions with husband financially so not worrying as much  Still uses the zolpidem every night only keeps her asleep for 3 hours, then off and on dozes  Has tingling and numbness on right side Tingles all the way down to her feet  Reflux is worse but has appt with Dr Russella Dar  Allergies are worse---going to allergiest again used immunotherapy in past Inhaled steroids caused headache Proair made her jittery and nervous  Has occ burning substernal chest pain--occ tightness. Clearly seems to be reflux to her Chronic breathing problems due to allergies--nasal mostly   Allergies: 1)  Trazodone Hcl (Trazodone Hcl)  Past History:  Past medical, surgical, family and social histories (including risk factors) reviewed for relevance to current acute and chronic problems.  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 2003 turbinate sinus surgery 1998 Hysterectomy, partial, vaginal: for mennorhagia.Marland Kitchenno cancer..no cervix Tonsillectomy  Family History: Reviewed history from 02/05/2009 and no changes required. father: died throat cancer, CAD MI age 7s, CVA during procedure, lung cancer Family History of CAD Female 1st degree relative <50 mother: CABG, CAD, CHF, HTNB, high chol, Aunts: DM PGM: DM, CAD MGM: CVA or MI No FH of Colon Cancer:  Social History: Reviewed history from 09/26/2009 and no changes required. 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  NAD Neck:  no masses, no thyromegaly, and no cervical lymphadenopathy.  Mild decrease in A-P mobiity with some trapezius tightness Lungs:  normal respiratory effort, no intercostal retractions, no accessory muscle use, and normal breath sounds.   Heart:  normal rate, regular rhythm, no murmur, and no gallop.   Abdomen:  soft and non-tender.   Extremities:  no edema Neurologic:  no focal weakness walks okay but trouble getting up on table due to right hip pain Psych:  normally interactive, good eye contact, and dysphoric affect.     Impression & Recommendations:  Problem # 1:  NECK PAIN, CHRONIC (ICD-723.1) Assessment Deteriorated  seems to be triggering migraines discussed heat at night will try flexeril also  Her updated medication list for this problem includes:    Cyclobenzaprine Hcl 10 Mg Tabs (Cyclobenzaprine hcl) .Marland Kitchen... 1 tab at bedtime as needed for neck tightness  Problem # 2:  HYPERTENSION (ICD-401.9) Assessment: Unchanged good control no changes needed  Her updated  medication list for this problem includes:    Atacand 16 Mg Tabs (Candesartan cilexetil) .Marland Kitchen... Take 1 tablet by mouth once a day  BP today: 122/80 Prior BP: 110/66 (09/30/2009)  Prior 10 Yr Risk Heart Disease: Not enough information (12/18/2008)  Labs Reviewed: K+: 4.2 (06/23/2009) Creat: : 0.8 (06/23/2009)   Chol: 167 (12/18/2008)   HDL: 56.70 (12/18/2008)   LDL: 94 (12/18/2008)   TG: 84.0 (12/18/2008)  Problem # 3:  DYSTHYMIC DISORDER (ICD-300.4) Assessment: Comment Only multiple stressors not sure she needs meds asked her to restart exercise  Problem # 4:  DEGENERATIVE DISC DISEASE (ICD-722.6) Assessment: Comment Only ongoing problems none severe enough to consider surgical eval  Complete Medication List: 1)  Loratadine 10 Mg Tabs (Loratadine) .... Take 1 tablet by mouth once a day 2)  Pravastatin Sodium 20 Mg Tabs (Pravastatin sodium) .... Take 1 tablet by mouth at bedtime 3)  Singulair 10 Mg Tabs (Montelukast sodium) .... Take 1 tab by mouth at bedtime 4)  Lorazepam 1 Mg Tabs (Lorazepam) .... Once daily as needed 5)  Atacand 16 Mg Tabs (Candesartan cilexetil) .... Take 1 tablet by mouth once a day 6)  Astepro 0.15 % Soln (Azelastine hcl) .... 2 sprays per nostril two times a day 7)  Vagifem 25 Mcg Tabs (Estradiol) .... As needed 8)  Vitamin D (ergocalciferol) 50000 Unit Caps (Ergocalciferol) .... Take one by mouth once a week 9)  Dexilant 60 Mg Cpdr (Dexlansoprazole) .... Take 1 by mouth once daily 10)  Zolpidem Tartrate 10 Mg Tabs (Zolpidem tartrate) .... 1/2 -1 tab at bedtime as needed to help sleep 11)  Zomig Zmt 2.5 Mg Tbdp (Zolmitriptan) .... Use 1 by mouth at the onset of headache 12)  Carafate 1 Gm/66ml Susp (Sucralfate) .... Take 1 gram after meals and at bedtime 13)  Clindamycin Hcl 300 Mg Caps (Clindamycin hcl) .... Take 1 by mouth 4 times daily until all finished (dentist rx'd) 14)  Cyclobenzaprine Hcl 10 Mg Tabs (Cyclobenzaprine hcl) .Marland Kitchen.. 1 tab at bedtime as  needed for neck tightness  Patient Instructions: 1)  Please schedule a follow-up appointment in 4-6  months for physical Prescriptions: CYCLOBENZAPRINE HCL 10 MG TABS (CYCLOBENZAPRINE HCL) 1 tab at bedtime as needed for neck tightness  #30 x 1   Entered and Authorized by:   Cindee Salt MD   Signed by:   Cindee Salt MD on 03/17/2010   Method used:   Electronically to        [id number] [address] (retail)       [address]       [place], [place]  [id number]       Ph: [phone]       Fax: [phone]   RxID:   [id number]    Orders Added: 1)  Est. Patient Level IV [[address]]    Current Allergies (reviewed today): TRAZODONE HCL (TRAZODONE HCL)

## 2010-06-04 NOTE — Consult Note (Signed)
Summary: [facility]  [facility]   Imported By: Sherian Rein 04/20/2010 10:29:22  _____________________________________________________________________  External Attachment:    Type:   Image     Comment:   External Document  Appended Document: [facility] globus sensation does appear to be due to GERD

## 2010-06-10 NOTE — Letter (Signed)
Summary: Previous records/DR [facility]  Previous records/DR [facility]   Imported By: Lester [place] 06/01/2010 09:03:06  _____________________________________________________________________  External Attachment:    Type:   Image     Comment:   External Document

## 2010-06-18 ENCOUNTER — Telehealth: Payer: Self-pay | Admitting: Gastroenterology

## 2010-06-24 NOTE — Progress Notes (Signed)
Summary: Med refill  Medications Added PRILOSEC 20 MG CPDR (OMEPRAZOLE) one tablet by mouth at bedtime       Phone Note Call from Patient Call back at Home Phone [phone]   Caller: Patient Call For: Dr. Russella Dar Reason for Call: Talk to Nurse Summary of Call: Pt takes dexilant, and we wanted her to take an additional pill at bed time, pt says medication we told her to take is too expensive and she wants to take prilosec instead, if this is ok wants [place] to fax a RX to her husband mail order pharmacy through [facility] fax [phone] phone [phone] Initial call taken by: [place] Johnson,  June 18, 2010 2:50 PM  Follow-up for Phone Call        Pt states she has been buying her OTC Prilosec to take at night instead of the ranitidine as instructed to take in November. Pt would like to continue the Prilosec because it has been really helping but wants to make sure that we ok with her taking that instead of the ranitidine prescribed. She can get the Prilosec free through her husband's mail order pharmacy. Is this ok? Follow-up by: Christie Nottingham CMA Duncan Dull),  June 18, 2010 4:29 PM  Additional Follow-up for Phone Call Additional follow up Details #1::        OK to continue Dexilant qam and Prilosec qpm. Additional Follow-up by: Meryl Dare MD Clementeen Graham,  June 19, 2010 8:57 AM    Additional Follow-up for Phone Call Additional follow up Details #2::    Informed patient that we send her rx to her husband's mail order for 90 day supply. Follow-up by: Christie Nottingham CMA Duncan Dull),  June 19, 2010 9:37 AM  New/Updated Medications: PRILOSEC 20 MG CPDR (OMEPRAZOLE) one tablet by mouth at bedtime Prescriptions: PRILOSEC 20 MG CPDR (OMEPRAZOLE) one tablet by mouth at bedtime  #90 x 2   Entered by:   Christie Nottingham CMA (AAMA)   Authorized by:   Meryl Dare MD [facility]   Signed by:   Christie Nottingham CMA (AAMA) on 06/19/2010   Method used:   Print then Give to Patient   RxID:    [id number]

## 2010-07-04 ENCOUNTER — Encounter: Payer: Self-pay | Admitting: Internal Medicine

## 2010-08-19 ENCOUNTER — Encounter: Payer: Self-pay | Admitting: Internal Medicine

## 2010-08-26 ENCOUNTER — Other Ambulatory Visit: Payer: Self-pay | Admitting: *Deleted

## 2010-08-26 ENCOUNTER — Emergency Department: Payer: Self-pay | Admitting: Emergency Medicine

## 2010-08-26 MED ORDER — ZOLPIDEM TARTRATE 10 MG PO TABS: ORAL_TABLET | ORAL | Status: DC

## 2010-08-26 NOTE — Telephone Encounter (Signed)
Okay#30 x 0 Was supposed to set up PE--make sure she makes appt

## 2010-08-26 NOTE — Telephone Encounter (Signed)
rx faxed to [facility], spoke with patient and advised that she needs to schedule an appt soon, she said she would call and schedule.

## 2010-09-18 NOTE — H&P (Signed)
NAME:  Christina Deleon, Christina Deleon                  ACCOUNT NO.:  [id number]   MEDICAL RECORD NO.:  [id number]          PATIENT TYPE:  INP   LOCATION:  1419                         FACILITY:  [facility]   PHYSICIAN:  Jonna L. Robb Matar, M.D.DATE OF BIRTH:  April 26, 1951   DATE OF ADMISSION:  11/15/2004  DATE OF DISCHARGE:                                HISTORY & PHYSICAL   CHIEF COMPLAINT:  Passed out.   HISTORY OF PRESENT ILLNESS:  This 60 year old African American female  yesterday had some dark stools about six times. She did not actually call it  diarrhea but she had several small times and then today she threw up after  she ate her lunch. At 4 a.m. this morning, she had gone to the bathroom  again, was all done and was washing her hands, and all of a sudden she got a  really bad spasm in her right hip which locked up. The pain was about a 6 or  a 7/10, and right after that, she started to pass out. Her husband caught  her from behind and said there was about two or three minutes where she was  unresponsive to voice and was very diaphoretic. She finally came around and  complained of her hip as being really sore and her legs and knees were weak.  It took about a half an hour or hour for that to clear up. At two hours  later, she had a second episode again at 6 a.m. She had gone to the bathroom  and again her hip locked up. This time she almost passed out but not quite.   PAST MEDICAL HISTORY:  1.  Arthritis:  She was told of this. She had a MRI by Dr. Lorenz Coaster this year.      She is not on any medication for it.  2.  GERD.  3.  Hypertension.  4.  Hypercholesterolemia.  5.  Migraine.   FAMILY HISTORY:  Heart disease, lung cancer, type 2 diabetes.   SOCIAL HISTORY:  Nonsmoker and nondrinker. No drugs. Married.   ALLERGIES:  PENICILLIN gives her a rash.   MEDICATIONS:  1.  Ambien 5 mg h.s.  2.  Nexium p.r.n.  3.  Hyzaar 100/25 mg daily.  4.  Imipramine 10 mg q.h.s.  5.  Zetia 10 mg daily.  6.   Zyrtec 10 mg daily.   REVIEW OF SYSTEMS:  She had been complaining of some right lower back pain  and some urinary frequency. No chest pain. No changes in her vision. No  history of asthma or breathing problems. No breast problems. No black or red  bowel movements. Other review of systems were negative.   PHYSICAL EXAMINATION:  VITAL SIGNS:  Temperature 97.1, pulse 93,  respirations 18, blood pressure 117/74 lying down and 131/77 standing up.  Weight 211.  GENERAL:  A well-developed African American female.  HEENT:  Conjunctivae and lids were normal. Pupils were reactive. Extraocular  movements are full. She had normal hearing, mucosa, and pharynx.  NECK:  No masses, thyromegaly, carotid bruits, or tenderness.  LUNGS:  Respiratory  effort was normal. Lungs were clear to A&P without  wheezing, rales, rhonchi, or dullness.  HEART:  Regular rate and rhythm. Normal S1 and S2 without murmurs, gallops,  or rubs.  EXTREMITIES:  There was no clubbing, cyanosis, or edema.  BREASTS:  Without masses, tenderness or discharge.  ABDOMEN:  Diffusely minimally tender but no localized. Bowel sounds were  positive with no hepatosplenomegaly or hernias.  GENITALIA:  External genitalia was normal.  LYMPHADENOPATHY:  There was no cervical or inguinal adenopathy.  NEUROLOGICAL:  Muscle strength was 5/5 and full range of motion in all four  extremities. Cranial nerves were intact. DTR's were 2+. Toes were downgoing.  Sensation was normal. She is alert and oriented x3. Normal memory, judgment,  and affect.  SKIN:  No rash, lesions, or nodules.   LABORATORY DATA:  CT scan of the head was negative. EKG was normal. BUN 15,  creatinine 0.8. Urinalysis negative. White count 4.3, hemoglobin 12.6.  Cardiac enzymes were negative. Differential showed some polychromasia and  atypical lymphocytosis.   IMPRESSION:  1.  Vasovagal syncope:  We will check her cardiac enzymes and monitor her      but the temporal  association between first getting the pain and then      having the syncope seems pretty strong.  2.  Osteoarthritis of the right hip: This is the precipitating event. I am      going to give her one shot of cortisone, x-ray her hips as she may need      an outpatient orthopedic referral if this continues.  3.  Gastroesophageal reflux disease:  Continue proton pump inhibitor.  4.  Mild gastroenteritis:  I will get a stool sample.  5.  Possible mild dehydration:  I do not know what her baseline BUN usually      is. I am going to continue her Cozaar but hold on the      hydrochlorothiazide.  6.  Hypercholesterolemia.       JLB/MEDQ  D:  11/15/2004  T:  11/15/2004  Job:  [id number]

## 2010-09-18 NOTE — Discharge Summary (Signed)
NAME:  Christina Deleon, Christina Deleon                  ACCOUNT NO.:  [id number]   MEDICAL RECORD NO.:  [id number]          PATIENT TYPE:  INP   LOCATION:  1419                         FACILITY:  [facility]   PHYSICIAN:  Jonna L. Robb Matar, M.D.DATE OF BIRTH:  1950-06-30   DATE OF ADMISSION:  11/15/2004  DATE OF DISCHARGE:  11/16/2004                                 DISCHARGE SUMMARY   PRIMARY CARE PHYSICIAN:  Dr. Lorenz Coaster.   FINAL DIAGNOSES:  1.  Vasovagal syncope.  2.  Right sacroiliitis.  3.  Gastroesophageal reflux disease.  4.  Mild gastroenteritis.  5.  Mild dehydration.  6.  Hypertension.  7.  Hypercholesterolemia.  8.  Migraine headache.   PROCEDURE:  None.   CONSULTATIONS:  None.   OPERATION:  None.   ALLERGIES:  PENICILLIN gives her a rash.   CODE STATUS:  Full.   HISTORY:  This 60 year old African-American female had some low-grade  diarrhea and vomiting.  When she was in the bathroom, she was washing her  hands, and her right hip locked up, and the pain was so severe that she  passed out for a few minutes, got very diaphoretic, and was generally weak.  About 2 hours later, she had a very similar episode again on standing up.  Her hip locked up, severe pain.  Almost passed out the second time, and at  that point, they came in to the hospital.  She has an underlying background  of hypertension for which she has been on Hyzaar 100/25 daily, and she has  been taking it.   PHYSICAL EXAMINATION:  ABDOMEN:  Unremarkable except for some minimal,  diffuse abdominal tenderness with normal bowel sounds.  No rebound.   LABORATORY WORK:  Initial laboratory work showed negative CT of the head,  EKG, cardiac enzymes and urinalysis.  Normal white count.  Normal CBC.   HOSPITAL COURSE:  The patient was monitored, and had occasional episodes of  mild sinus tachycardia, but nothing unusual.  She received 1 dose of Solu-  Medrol, and by the following morning, her back pain and discomfort are  completely cleared.  She had no further diarrhea or nausea and vomiting.  X-  ray of her hips showed the hips were fine, but she had some sacroiliitis  with sclerosis and spurring.   DISPOSITION:  The patient will be discharged on Hyzaar 100/25 once daily,  but she is not to restart it until November 18, 2004.  She will continue her  Zetia 10 daily, Zyrtec 10 daily, Nexium 40 daily for the next 2 weeks,  Ambien 5 1/2 pill at bedtime, imipramine 10 q.h.s.  She will receive a  second dose of Solu-Medrol 60 IV before she goes.  She is to see Dr. Lorenz Coaster  in one week.       JLB/MEDQ  D:  11/16/2004  T:  11/16/2004  Job:  [id number]   cc:   Reuben Likes, M.D.  [address]  [place]  [place] [address]  Fax: [phone]

## 2010-09-18 NOTE — Procedures (Signed)
NAME:  Christina Deleon, Christina Deleon                  ACCOUNT NO.:  [id number]   MEDICAL RECORD NO.:  [id number]          PATIENT TYPE:  OUT   LOCATION:  SLEEP CENTER                 FACILITY:  [facility]   PHYSICIAN:  Clinton D. Maple Hudson, M.D. DATE OF BIRTH:  1950/08/22   DATE OF STUDY:  01/06/2005                              NOCTURNAL POLYSOMNOGRAM   REFERRING PHYSICIAN:  Dr. Leslee Home.   DATE OF STUDY:  January 06, 2005.   INDICATION FOR STUDY:  Insomnia with sleep apnea. Epworth sleepiness score  4/24, BMI of 28, weight 164 pounds.   SLEEP ARCHITECTURE:  Total sleep time 364 minutes with sleep efficiency 84%.  Stage I was 4%, stage II 69%, stages III and IV were absent, REM 10% of  total sleep time. Sleep latency 1/2 minute, REM latency 146 minutes, awake  after sleep onset 69 minutes, arousal index 30. She took Ambien 5 milligrams  at 9:55 p.m. Sleep was fragmented with frequent brief awakening through the  night.   RESPIRATORY DATA:  Apnea/hypopnea index (AHI, RDI) 13.4 obstructive events  per hour indicating mild obstructive sleep apnea/hypopnea syndrome. This  included 7 obstructive apneas and 74 hypopneas. Events were not positional.  REM AHI 21.6 per hour. There were insufficient events within the protocol  time to administer initiation of C-PAP titration by protocol on the study  night.   OXYGEN DATA:  Mild to moderate snoring with oxygen desaturation to a nadir  of 89%. Mean oxygen saturation through the study was 96% on room air.   CARDIAC DATA:  Normal sinus rhythm.   MOVEMENT/PARASOMNIA:  A total of 134 limb jerks were recorded of which 65  were associated with arousal or awakening for a periodic limb movement with  arousal index of 10.7 per hour which is increased.   IMPRESSION/RECOMMENDATIONS:  1.  Fragmented sleep, nonspecific, with short sleep onset after Ambien.  2.  Mild obstructive sleep apnea/hypopnea syndrome, apnea/hypopnea index      13.4 per hour with mild to moderate  snoring and oxygen desaturation to      89%.  3.  Consider return for C-PAP titration or evaluate for alternative      therapies as appropriate.  4.  Periodic limb movement with arousal, 10.7 per hour. This may partly      reflect stimulation from sleep apnea      arousals and could be reconsidered clinically after sleep apnea is      treated.  5.  Patient awoke that morning complaining of leg cramp.      Clinton D. Maple Hudson, M.D.  Diplomate, Biomedical engineer of Sleep Medicine  Electronically Signed     CDY/MEDQ  D:  01/10/2005 10:43:03  T:  01/10/2005 23:40:21  Job:  [id number]

## 2010-11-18 ENCOUNTER — Telehealth: Payer: Self-pay | Admitting: Gastroenterology

## 2010-11-19 MED ORDER — OMEPRAZOLE 20 MG PO CPDR: 20.0000 mg | DELAYED_RELEASE_CAPSULE | Freq: Two times a day (BID) | ORAL | Status: DC

## 2010-11-19 NOTE — Telephone Encounter (Signed)
Script for Prilosec faxed to [facility] because it wasn't listed in our pharmacy search data base.Ph [phone] Fax [phone]. Pt reports she lost her job and can't afford the Dexilant. Informed pt I will order the Prilosec, but report any rebound effects drom discontinuing the Dexilant; pt stated understanding.

## 2011-01-13 ENCOUNTER — Ambulatory Visit (HOSPITAL_COMMUNITY)
Admission: RE | Admit: 2011-01-13 | Discharge: 2011-01-13 | Disposition: A | Discharge status: Home / Self Care | Payer: 59 | Source: Ambulatory Visit | Attending: Rheumatology | Admitting: Rheumatology

## 2011-01-13 ENCOUNTER — Other Ambulatory Visit (HOSPITAL_COMMUNITY): Payer: Self-pay | Admitting: Rheumatology

## 2011-01-13 DIAGNOSIS — I10 Essential (primary) hypertension: Secondary | ICD-10-CM | POA: Insufficient documentation

## 2011-01-13 DIAGNOSIS — R0602 Shortness of breath: Secondary | ICD-10-CM | POA: Insufficient documentation

## 2011-01-13 DIAGNOSIS — D869 Sarcoidosis, unspecified: Secondary | ICD-10-CM

## 2011-02-17 ENCOUNTER — Encounter: Payer: Self-pay | Admitting: Rheumatology

## 2011-03-04 ENCOUNTER — Encounter: Payer: Self-pay | Admitting: Rheumatology

## 2011-04-03 ENCOUNTER — Encounter: Payer: Self-pay | Admitting: Rheumatology

## 2011-05-04 ENCOUNTER — Encounter: Payer: Self-pay | Admitting: Rheumatology

## 2011-05-20 ENCOUNTER — Other Ambulatory Visit: Payer: Self-pay | Admitting: Family Medicine

## 2011-05-20 DIAGNOSIS — M25511 Pain in right shoulder: Secondary | ICD-10-CM

## 2011-05-28 ENCOUNTER — Other Ambulatory Visit: Payer: 59

## 2011-06-03 ENCOUNTER — Ambulatory Visit
Admission: RE | Admit: 2011-06-03 | Discharge: 2011-06-03 | Disposition: A | Discharge status: Home / Self Care | Payer: 59 | Source: Ambulatory Visit | Attending: Family Medicine | Admitting: Family Medicine

## 2011-06-03 DIAGNOSIS — M25511 Pain in right shoulder: Secondary | ICD-10-CM

## 2011-06-04 ENCOUNTER — Encounter: Payer: Self-pay | Admitting: Rheumatology

## 2011-07-02 ENCOUNTER — Encounter: Payer: Self-pay | Admitting: Rheumatology

## 2011-07-06 ENCOUNTER — Telehealth: Payer: Self-pay | Admitting: Gastroenterology

## 2011-07-06 MED ORDER — OMEPRAZOLE 20 MG PO CPDR: 20.0000 mg | DELAYED_RELEASE_CAPSULE | Freq: Two times a day (BID) | ORAL | Status: DC

## 2011-07-06 NOTE — Telephone Encounter (Signed)
Faxed the prescription to her mail order pharmacy and scheduled patient for a f/u visit on 07/21/11. Pt agreed and verbalized understanding.

## 2011-07-21 ENCOUNTER — Ambulatory Visit (INDEPENDENT_AMBULATORY_CARE_PROVIDER_SITE_OTHER): Payer: 59 | Admitting: Gastroenterology

## 2011-07-21 ENCOUNTER — Encounter: Payer: Self-pay | Admitting: Gastroenterology

## 2011-07-21 VITALS — BP 124/76 | HR 72 | Ht 64.0 in | Wt 166.0 lb

## 2011-07-21 DIAGNOSIS — Z86010 Personal history of colonic polyps: Secondary | ICD-10-CM

## 2011-07-21 DIAGNOSIS — K219 Gastro-esophageal reflux disease without esophagitis: Secondary | ICD-10-CM

## 2011-07-21 MED ORDER — RANITIDINE HCL 150 MG PO TABS: 150.0000 mg | ORAL_TABLET | Freq: Every day | ORAL | Status: DC

## 2011-07-21 MED ORDER — OMEPRAZOLE 20 MG PO CPDR: 40.0000 mg | DELAYED_RELEASE_CAPSULE | Freq: Two times a day (BID) | ORAL | Status: DC

## 2011-07-21 NOTE — Patient Instructions (Addendum)
We have sent the following medications to your mail order pharmacy: Ranitidine and Omeprazole.  cc: Lavada Mesi, MD

## 2011-07-21 NOTE — Progress Notes (Signed)
History of Present Illness: This is a 61 year old female who has had GERD with LPR. She has ongoing problems with globus sensation throat clearing and hoarseness. Her symptoms are generally fairly well controlled but she does have frequent exacerbations. Most recently she has been taking omeprazole 20 mg twice a day and Carafate daily. Previously she was recommended to remain on daily Dexilant and ranitidine. Denies weight loss, abdominal pain, constipation, diarrhea, change in stool caliber, melena, hematochezia, nausea, vomiting, dysphagia, chest pain.  Current Medications, Allergies, Past Medical History, Past Surgical History, Family History and Social History were reviewed in [employer] record.  Physical Exam: General: Well developed , well nourished, no acute distress Head: Normocephalic and atraumatic Eyes:  sclerae anicteric, EOMI Ears: Normal auditory acuity Mouth: No deformity or lesions Lungs: Clear throughout to auscultation Heart: Regular rate and rhythm; no murmurs, rubs or bruits Abdomen: Soft, non tender and non distended. No masses, hepatosplenomegaly or hernias noted. Normal Bowel sounds Musculoskeletal: Symmetrical with no gross deformities  Pulses:  Normal pulses noted Extremities: No clubbing, cyanosis, edema or deformities noted Neurological: Alert oriented x 4, grossly nonfocal Psychological:  Alert and cooperative. Normal mood and affect  Assessment and Recommendations:  1. GERD with LPR. Symptoms not adequately controlled. She would like to remain on omeprazole as she is able to obtain this medication at a low cost. Increase the omeprazole to 40 mg twice a day taken 30 minutes before breakfast and dinner. Begin ranitidine 150 mg at bedtime. Discontinue Carafate. Follow all antireflux measures. If her symptoms are not adequately controlled she is to call for further advice. Return office visit one year.  2. Personal history of adenomatous colon  polyps. Surveillance colonoscopy recommended December 2015.

## 2011-07-29 ENCOUNTER — Telehealth: Payer: Self-pay | Admitting: Gastroenterology

## 2011-07-29 NOTE — Telephone Encounter (Signed)
Informed patient that she is to take the Zantac at bedtime for breakthrough symptoms. Told her to still continue to take the Prilosec 40 mg one tablet by mouth twice daily along with the Zantac at night. Pt agreed and verbalized understanding.

## 2011-10-01 ENCOUNTER — Other Ambulatory Visit: Payer: Self-pay | Admitting: Internal Medicine

## 2012-01-26 ENCOUNTER — Encounter (HOSPITAL_COMMUNITY): Payer: Self-pay | Admitting: Emergency Medicine

## 2012-01-26 ENCOUNTER — Emergency Department (HOSPITAL_COMMUNITY)
Admission: EM | Admit: 2012-01-26 | Discharge: 2012-01-26 | Disposition: A | Discharge status: Home / Self Care | Payer: 59 | Source: Home / Self Care | Attending: Family Medicine | Admitting: Family Medicine

## 2012-01-26 DIAGNOSIS — G43909 Migraine, unspecified, not intractable, without status migrainosus: Secondary | ICD-10-CM

## 2012-01-26 HISTORY — DX: Migraine, unspecified, not intractable, without status migrainosus: G43.909

## 2012-01-26 MED ORDER — ONDANSETRON HCL 4 MG PO TABS: 4.0000 mg | ORAL_TABLET | Freq: Three times a day (TID) | ORAL | Status: DC | PRN

## 2012-01-26 MED ORDER — IBUPROFEN 800 MG PO TABS: 800.0000 mg | ORAL_TABLET | Freq: Three times a day (TID) | ORAL | Status: DC | PRN

## 2012-01-26 MED ORDER — PREDNISONE 20 MG PO TABS: ORAL_TABLET | ORAL | Status: DC

## 2012-01-26 MED ORDER — KETOROLAC TROMETHAMINE 30 MG/ML IJ SOLN
INTRAMUSCULAR | Status: AC
  Filled 2012-01-26: qty 1

## 2012-01-26 MED ORDER — ONDANSETRON HCL 4 MG/2ML IJ SOLN
4.0000 mg | Freq: Once | INTRAMUSCULAR | Status: AC
  Administered 2012-01-26: 4 mg via INTRAMUSCULAR

## 2012-01-26 MED ORDER — KETOROLAC TROMETHAMINE 30 MG/ML IJ SOLN
30.0000 mg | Freq: Once | INTRAMUSCULAR | Status: AC
  Administered 2012-01-26: 30 mg via INTRAVENOUS

## 2012-01-26 MED ORDER — ONDANSETRON HCL 4 MG/2ML IJ SOLN
INTRAMUSCULAR | Status: AC
  Filled 2012-01-26: qty 2

## 2012-01-26 NOTE — ED Notes (Signed)
Delay in administering medications secondary to acuity of assignment/department

## 2012-01-26 NOTE — ED Notes (Signed)
Intermittent headache for one week, for the last 3 days headache has "escalated" has intermittently vomited.  C/o nausea currently.  Pain is right side of forehead, face, neck.  Reports history of migraines.  Reports today's pain is her typical migraine.  Patient reports sensitivity to light.

## 2012-01-26 NOTE — ED Notes (Signed)
Toradol was given IM , Right glut

## 2012-01-26 NOTE — ED Provider Notes (Signed)
History     CSN: [id number]  Arrival date & time 01/26/12  1155   First MD Initiated Contact with Patient 01/26/12 1202      Chief Complaint  Patient presents with  . Headache    (Consider location/radiation/quality/duration/timing/severity/associated sxs/prior treatment) HPI Comments: 61 year old female with history of migraines. Here complaining of 3 days with daily worsening headaches. States her headaches are as prior migraine crisis. Mostly frontal but radiating to the right temporal, right face and right neck. Symptoms associated with photophobia and nausea. Had 2 vomiting episodes yesterday (food content). Denies extremity numbness, weakness or paresthesia. No balance or gait problems. No dysuria, abdominal pain, fever or chills. No difficulties with speech or swallowing. No cough or congestion.   Past Medical History  Diagnosis Date  . Arthritis     osteo  . GERD (gastroesophageal reflux disease)   . Hyperlipidemia   . Hypertension   . Allergy   . Chronic gastritis   . Hemorrhoids   . Sleep disturbance   . Adenomatous colon polyp 10/1997, and 03/2009  . Menorrhagia     partial hysterectomy  . Migraines     Past Surgical History  Procedure Date  . Vaginal hysterectomy 1998    partial , no cancer, no cervix  . Tonsillectomy   . Turbinate sinus surgery 2003    Family History  Problem Relation Age of Onset  . Throat cancer Father   . Coronary artery disease Father   . Heart attack Father 48  . Stroke Father     during procedure  . Lung cancer Father   . Coronary artery disease Mother   . Heart failure Mother   . Hypertension Mother   . Hyperlipidemia Mother   . Diabetes      aunt  . Diabetes Paternal Grandmother   . Coronary artery disease Paternal Grandmother   . Stroke Maternal Grandmother     or MI    History  Substance Use Topics  . Smoking status: Never Smoker   . Smokeless tobacco: Never Used  . Alcohol Use: No    OB History    Grav Para  Term Preterm Abortions TAB SAB Ect Mult Living                  Review of Systems  Constitutional: Negative for fever, chills, appetite change and fatigue.       10 systems reviewed and  pertinent negative and positive symptoms are as per HPI.     HENT: Negative for congestion, sore throat, rhinorrhea, trouble swallowing and sinus pressure.   Eyes: Positive for photophobia. Negative for pain, redness and visual disturbance.  Cardiovascular: Negative for chest pain, palpitations and leg swelling.  Gastrointestinal: Positive for nausea and vomiting. Negative for abdominal pain and diarrhea.  Genitourinary: Negative for dysuria, frequency, hematuria, flank pain and pelvic pain.  Musculoskeletal: Negative for joint swelling and arthralgias.  Skin: Negative for rash.  Neurological: Positive for headaches. Negative for dizziness, tremors, seizures, syncope, facial asymmetry, speech difficulty, weakness and numbness.  All other systems reviewed and are negative.    Allergies  Trazodone hcl  Home Medications   Current Outpatient Rx  Name Route Sig Dispense Refill  . TRAMADOL HCL 50 MG PO TABS Oral Take 50 mg by mouth every 6 (six) hours as needed.    . AZELASTINE HCL 0.15 % NA SOLN Nasal 2 sprays by Nasal route 2 (two) times daily.      Marland Kitchen CANDESARTAN CILEXETIL 16 MG  PO TABS Oral Take 16 mg by mouth daily.      Marland Kitchen ESTRADIOL 25 MCG VA TABS Vaginal Place 25 mcg vaginally as needed.      . IBUPROFEN 800 MG PO TABS Oral Take 1 tablet (800 mg total) by mouth every 8 (eight) hours as needed for pain. 20 tablet 0  . LORATADINE 10 MG PO TABS Oral Take 10 mg by mouth daily.      Marland Kitchen METAXALONE 800 MG PO TABS Oral Take 800 mg by mouth 3 (three) times daily as needed.    Marland Kitchen MONTELUKAST SODIUM 10 MG PO TABS Oral Take 10 mg by mouth at bedtime.      . OMEPRAZOLE 20 MG PO CPDR Oral Take 2 capsules (40 mg total) by mouth 2 (two) times daily. 180 capsule 3  . ONDANSETRON HCL 4 MG PO TABS Oral Take 1 tablet  (4 mg total) by mouth every 8 (eight) hours as needed for nausea. 12 tablet 0  . PRAVASTATIN SODIUM 20 MG PO TABS Oral Take 20 mg by mouth daily.      Marland Kitchen PREDNISONE 20 MG PO TABS  2 tabs by mouth daily for 5 days 10 tablet 0  . RANITIDINE HCL 150 MG PO TABS Oral Take 1 tablet (150 mg total) by mouth at bedtime. 90 tablet 3  . SUCRALFATE 1 GM/10ML PO SUSP Oral Take 1 g by mouth 4 (four) times daily -  with meals and at bedtime.      Marland Kitchen VITAMIN D (ERGOCALCIFEROL) 50000 UNITS PO CAPS Oral Take 50,000 Units by mouth every 7 (seven) days.      Marland Kitchen ZOLMITRIPTAN 2.5 MG PO TBDP Oral Take 2.5 mg by mouth. One by mouth at the onset of headache     . ZOLPIDEM TARTRATE 10 MG PO TABS  1/2-1 tab at bedtime as needed to help sleep 30 tablet 0    BP 133/84  Pulse 80  Temp 98.4 F (36.9 C) (Oral)  Resp 18  SpO2 99%  Physical Exam  Nursing note and vitals reviewed. Constitutional: She is oriented to person, place, and time. She appears well-developed and well-nourished. No distress.  HENT:  Head: Normocephalic and atraumatic.  Right Ear: External ear normal.  Left Ear: External ear normal.  Nose: Nose normal.  Mouth/Throat: Oropharynx is clear and moist. No oropharyngeal exudate.  Eyes: Conjunctivae normal and EOM are normal. Pupils are equal, round, and reactive to light. Right eye exhibits no discharge. Left eye exhibits no discharge.  Neck: Neck supple. No JVD present. No thyromegaly present.  Cardiovascular: Normal rate, regular rhythm and normal heart sounds.   Pulmonary/Chest: Breath sounds normal.  Abdominal: Soft. She exhibits no distension. There is no tenderness.  Neurological: She is alert and oriented to person, place, and time. She has normal reflexes. She displays normal reflexes. No cranial nerve deficit or sensory deficit. She exhibits normal muscle tone. She displays a negative Romberg sign. Coordination and gait normal.       No face drop. No arm drop.  Visual fields normal by  comparison.  Skin: No rash noted.    ED Course  Procedures (including critical care time)  Labs Reviewed - No data to display No results found.   1. Migraine headache       MDM  Treated here with ketorolac 30 mg IM x1 and ondansetron 4 mg IM x1. Prescribed prednisone and ondansetron. Patient will continue to use Zomig as previously prescribed. Asked to go to the  emergency department if persistent or worsening symptoms despite following treatment. Note: Toradol was ordered IV by error was intended to be given IM a verbal order was given for IM administration of both medications and both Toradol and ondansetron were administered IM. Patient reported improvement of her symptoms prior discharge.         Sharin Grave, MD 01/29/12 [phone]

## 2012-02-16 ENCOUNTER — Other Ambulatory Visit: Payer: Self-pay | Admitting: Family Medicine

## 2012-02-16 DIAGNOSIS — M899 Disorder of bone, unspecified: Secondary | ICD-10-CM

## 2012-02-16 DIAGNOSIS — M949 Disorder of cartilage, unspecified: Secondary | ICD-10-CM

## 2012-03-07 ENCOUNTER — Ambulatory Visit
Admission: RE | Admit: 2012-03-07 | Discharge: 2012-03-07 | Disposition: A | Discharge status: Home / Self Care | Payer: 59 | Source: Ambulatory Visit | Attending: Family Medicine | Admitting: Family Medicine

## 2012-03-07 DIAGNOSIS — M899 Disorder of bone, unspecified: Secondary | ICD-10-CM

## 2012-05-01 ENCOUNTER — Emergency Department (HOSPITAL_COMMUNITY)
Admission: EM | Admit: 2012-05-01 | Discharge: 2012-05-01 | Disposition: A | Discharge status: Home / Self Care | Payer: 59 | Source: Home / Self Care | Attending: Family Medicine | Admitting: Family Medicine

## 2012-05-01 ENCOUNTER — Encounter (HOSPITAL_COMMUNITY): Payer: Self-pay | Admitting: Emergency Medicine

## 2012-05-01 DIAGNOSIS — G43909 Migraine, unspecified, not intractable, without status migrainosus: Secondary | ICD-10-CM

## 2012-05-01 DIAGNOSIS — J069 Acute upper respiratory infection, unspecified: Secondary | ICD-10-CM

## 2012-05-01 LAB — POCT RAPID STREP A: Streptococcus, Group A Screen (Direct): NEGATIVE

## 2012-05-01 MED ORDER — ONDANSETRON 4 MG PO TBDP
4.0000 mg | ORAL_TABLET | Freq: Once | ORAL | Status: AC
  Administered 2012-05-01: 4 mg via ORAL

## 2012-05-01 MED ORDER — ONDANSETRON 4 MG PO TBDP
ORAL_TABLET | ORAL | Status: AC
  Filled 2012-05-01: qty 1

## 2012-05-01 MED ORDER — KETOROLAC TROMETHAMINE 30 MG/ML IJ SOLN
INTRAMUSCULAR | Status: AC
  Filled 2012-05-01: qty 1

## 2012-05-01 MED ORDER — AMOXICILLIN 500 MG PO CAPS: 500.0000 mg | ORAL_CAPSULE | Freq: Three times a day (TID) | ORAL | Status: DC

## 2012-05-01 MED ORDER — HYDROCODONE-ACETAMINOPHEN 7.5-325 MG/15ML PO SOLN: 10.0000 mL | Freq: Four times a day (QID) | ORAL | Status: DC | PRN

## 2012-05-01 MED ORDER — PREDNISONE 20 MG PO TABS: ORAL_TABLET | ORAL | Status: DC

## 2012-05-01 MED ORDER — IBUPROFEN 600 MG PO TABS: 600.0000 mg | ORAL_TABLET | Freq: Three times a day (TID) | ORAL | Status: DC | PRN

## 2012-05-01 MED ORDER — KETOROLAC TROMETHAMINE 30 MG/ML IJ SOLN
30.0000 mg | Freq: Once | INTRAMUSCULAR | Status: AC
  Administered 2012-05-01: 30 mg via INTRAMUSCULAR

## 2012-05-01 MED ORDER — BENZONATATE 100 MG PO CAPS: 100.0000 mg | ORAL_CAPSULE | Freq: Three times a day (TID) | ORAL | Status: DC

## 2012-05-01 MED ORDER — ONDANSETRON 4 MG PO TBDP: 4.0000 mg | ORAL_TABLET | Freq: Three times a day (TID) | ORAL | Status: DC | PRN

## 2012-05-01 NOTE — ED Provider Notes (Signed)
History     CSN: [id number]  Arrival date & time 05/01/12  1011   First MD Initiated Contact with Patient 05/01/12 1015      Chief Complaint  Patient presents with  . URI    (Consider location/radiation/quality/duration/timing/severity/associated sxs/prior treatment) HPI Comments: 61 year old female with history of hypertension, migraines and chronic rhinitis among other comorbidities. Here complaining of severe nasal congestion, clear rhinorrhea and right ear pain, nonproductive cough and "low-grade temp up to 100 F" for 3 days. Today patient also complaining of migraine headache associated with photophobia nausea and one episode of food content emesis today. Denies abdominal pain, chest pain or shortness of breath. Has not taken any medication for her symptoms this morning her temperature is 99 Fahrenheit here. Patient reports positive sick contacts with cold like symptoms recently.   Past Medical History  Diagnosis Date  . Arthritis     osteo  . GERD (gastroesophageal reflux disease)   . Hyperlipidemia   . Hypertension   . Allergy   . Chronic gastritis   . Hemorrhoids   . Sleep disturbance   . Adenomatous colon polyp 10/1997, and 03/2009  . Menorrhagia     partial hysterectomy  . Migraines     Past Surgical History  Procedure Date  . Vaginal hysterectomy 1998    partial , no cancer, no cervix  . Tonsillectomy   . Turbinate sinus surgery 2003    Family History  Problem Relation Age of Onset  . Throat cancer Father   . Coronary artery disease Father   . Heart attack Father 97  . Stroke Father     during procedure  . Lung cancer Father   . Coronary artery disease Mother   . Heart failure Mother   . Hypertension Mother   . Hyperlipidemia Mother   . Diabetes      aunt  . Diabetes Paternal Grandmother   . Coronary artery disease Paternal Grandmother   . Stroke Maternal Grandmother     or MI    History  Substance Use Topics  . Smoking status: Never Smoker    . Smokeless tobacco: Never Used  . Alcohol Use: No    OB History    Grav Para Term Preterm Abortions TAB SAB Ect Mult Living                  Review of Systems  Constitutional: Positive for chills. Negative for fever.  HENT: Positive for ear pain, congestion, sore throat, rhinorrhea and sinus pressure. Negative for facial swelling, neck pain and neck stiffness.   Eyes: Positive for photophobia. Negative for pain, discharge, redness and visual disturbance.  Respiratory: Positive for cough. Negative for shortness of breath and wheezing.   Cardiovascular: Negative for chest pain, palpitations and leg swelling.  Gastrointestinal: Positive for nausea and vomiting. Negative for abdominal pain and diarrhea.  Genitourinary: Negative for dysuria, frequency, hematuria, flank pain, vaginal bleeding, vaginal discharge and pelvic pain.  Skin: Negative for rash.  Neurological: Positive for headaches. Negative for dizziness, tremors, seizures, syncope, speech difficulty, weakness and numbness.    Allergies  Trazodone hcl  Home Medications   Current Outpatient Rx  Name  Route  Sig  Dispense  Refill  . CANDESARTAN CILEXETIL 16 MG PO TABS   Oral   Take 16 mg by mouth daily.           Marland Kitchen LORATADINE 10 MG PO TABS   Oral   Take 10 mg by mouth daily.           Marland Kitchen  MONTELUKAST SODIUM 10 MG PO TABS   Oral   Take 10 mg by mouth at bedtime.           . OMEPRAZOLE 20 MG PO CPDR   Oral   Take 2 capsules (40 mg total) by mouth 2 (two) times daily.   180 capsule   3   . PRAVASTATIN SODIUM 20 MG PO TABS   Oral   Take 20 mg by mouth daily.           Marland Kitchen ZOLMITRIPTAN 2.5 MG PO TBDP   Oral   Take 2.5 mg by mouth. One by mouth at the onset of headache          . ZOLPIDEM TARTRATE 10 MG PO TABS      1/2-1 tab at bedtime as needed to help sleep   30 tablet   0   . AMOXICILLIN 500 MG PO CAPS   Oral   Take 1 capsule (500 mg total) by mouth 3 (three) times daily.   21 capsule   0     . AZELASTINE HCL 0.15 % NA SOLN   Nasal   2 sprays by Nasal route 2 (two) times daily.           Marland Kitchen BENZONATATE 100 MG PO CAPS   Oral   Take 1 capsule (100 mg total) by mouth every 8 (eight) hours.   21 capsule   0   . ESTRADIOL 25 MCG VA TABS   Vaginal   Place 25 mcg vaginally as needed.           Marland Kitchen HYDROCODONE-ACETAMINOPHEN 7.5-325 MG/15ML PO SOLN   Oral   Take 10 mLs by mouth 4 (four) times daily as needed for pain (or cough).   120 mL   0   . IBUPROFEN 600 MG PO TABS   Oral   Take 1 tablet (600 mg total) by mouth every 8 (eight) hours as needed for pain or fever.   20 tablet   0   . METAXALONE 800 MG PO TABS   Oral   Take 800 mg by mouth 3 (three) times daily as needed.         Marland Kitchen ONDANSETRON 4 MG PO TBDP   Oral   Take 1 tablet (4 mg total) by mouth every 8 (eight) hours as needed for nausea.   10 tablet   0   . PREDNISONE 20 MG PO TABS      2 tabs by mouth daily for 5 days.   10 tablet   0   . RANITIDINE HCL 150 MG PO TABS   Oral   Take 1 tablet (150 mg total) by mouth at bedtime.   90 tablet   3   . SUCRALFATE 1 GM/10ML PO SUSP   Oral   Take 1 g by mouth 4 (four) times daily -  with meals and at bedtime.           . TRAMADOL HCL 50 MG PO TABS   Oral   Take 50 mg by mouth every 6 (six) hours as needed.         Marland Kitchen VITAMIN D (ERGOCALCIFEROL) 50000 UNITS PO CAPS   Oral   Take 50,000 Units by mouth every 7 (seven) days.             BP 145/87  Pulse 90  Temp 99 F (37.2 C) (Oral)  Resp 20  SpO2 97%  Physical Exam  Nursing note and  vitals reviewed. Constitutional: She is oriented to person, place, and time. She appears well-developed and well-nourished.       photophobic uncomfortable with migraine pain.  HENT:  Head: Normocephalic and atraumatic.  Right Ear: External ear normal.  Left Ear: External ear normal.       Nasal Congestion with erythema and swelling of nasal turbinates, clear rhinorrhea. Maxillary sinus pressure pain  reported  bilateral Erythema, no exudates. No uvula deviation. No trismus. TM's with increased vascular markings and clear fluid behind, right TM with some dullness but no swelling or bulging bilateral.  Cardiovascular: Normal rate, regular rhythm and normal heart sounds.   Pulmonary/Chest: Effort normal and breath sounds normal. No respiratory distress. She has no wheezes. She has no rales. She exhibits no tenderness.  Abdominal: Soft. There is no tenderness.  Neurological: She is alert and oriented to person, place, and time.  Skin: No rash noted. She is not diaphoretic.    ED Course  Procedures (including critical care time)   Labs Reviewed  POCT RAPID STREP A ([facility] ONLY)  LAB REPORT - SCANNED   No results found.   1. URI (upper respiratory infection)   2. Migraine       MDM  Negative strep test. Impress viral upper respiratory infection triggering migraine headache. There are some findings in the right eardrum that could represent early otitis media. Patient was treated with Toradol at 30 mg IM x1 and ondansetron 4 mg ODT x1 here prior to discharge.  I recommended symptomatic treatment and prescribed ibuprofen, hydrocodone/acetaminophen and ondansetron.  Recommended Claritin- D, only for 5 days as patient has a history of hypertension although her blood pressure is normal today.  Asked to continue to take blood pressure medications consistently. Patient was provided with a hold prescription for amoxicillin and prednisone and recommended to fill the prescriptions if late onset of fever, worsening ear pain or sinus pain after 5-7 days from day one. Supportive care and red flags that should prompt her return to medical attention discussed with patient and provided in writing.         Sharin Grave, MD 05/03/12 [phone]

## 2012-05-01 NOTE — ED Notes (Signed)
Pt c/o w/onset of 3 days... Sx include: cough w/yellow sputum and streaks of blood, nasal congestion, chills, headaches, nauseas, sore throat... Denies: fevers, vomiting, diarrhea... Hx of migraines.. She is alert w/no signs of acute distress.

## 2012-05-16 ENCOUNTER — Other Ambulatory Visit: Payer: Self-pay | Admitting: Dermatology

## 2012-07-10 ENCOUNTER — Encounter: Payer: Self-pay | Admitting: Gastroenterology

## 2012-07-10 ENCOUNTER — Ambulatory Visit (INDEPENDENT_AMBULATORY_CARE_PROVIDER_SITE_OTHER): Payer: 59 | Admitting: Gastroenterology

## 2012-07-10 VITALS — BP 104/62 | HR 88 | Ht 64.0 in | Wt 163.4 lb

## 2012-07-10 DIAGNOSIS — Z86010 Personal history of colonic polyps: Secondary | ICD-10-CM

## 2012-07-10 DIAGNOSIS — R079 Chest pain, unspecified: Secondary | ICD-10-CM

## 2012-07-10 DIAGNOSIS — K219 Gastro-esophageal reflux disease without esophagitis: Secondary | ICD-10-CM

## 2012-07-10 MED ORDER — OMEPRAZOLE 40 MG PO CPDR: 40.0000 mg | DELAYED_RELEASE_CAPSULE | Freq: Two times a day (BID) | ORAL | Status: DC

## 2012-07-10 MED ORDER — RANITIDINE HCL 150 MG PO TABS: 150.0000 mg | ORAL_TABLET | Freq: Every day | ORAL | Status: DC

## 2012-07-10 MED ORDER — SUCRALFATE 1 GM/10ML PO SUSP: 1.0000 g | Freq: Three times a day (TID) | ORAL | Status: DC

## 2012-07-10 NOTE — Patient Instructions (Addendum)
We have sent the following medications to your mail order pharmacy for you: Zantac, Prilosec, and Carafate.  Thank you for choosing me and [facility].  Venita Lick. Pleas Koch., MD., Clementeen Graham

## 2012-07-10 NOTE — Progress Notes (Signed)
History of Present Illness: This is a 62 year old female accompanied by her husband. She has a history of GERD and she is currently treated with omeprazole 40 mg twice daily and ranitidine 150 mg at bedtime. She describes occasional postprandial substernal burning and epigastric discomfort. She has ongoing problems with epigastric and diffuse anterior chest pain. She notes globus sensation. She takes ibuprofen frequently for fibromyalgia with ongoing problems in her neck, back, shoulders and chest. She underwent upper endoscopy in 2010. Denies weight loss, abdominal pain, constipation, diarrhea, change in stool caliber, melena, hematochezia, nausea, vomiting, dysphagia.  Current Medications, Allergies, Past Medical History, Past Surgical History, Family History and Social History were reviewed in [employer] record.  Physical Exam: General: Well developed , well nourished, no acute distress Head: Normocephalic and atraumatic Eyes:  sclerae anicteric, EOMI Ears: Normal auditory acuity Mouth: No deformity or lesions Lungs: Clear throughout to auscultation, lower anterior chest wall tenderness to palpation Heart: Regular rate and rhythm; no murmurs, rubs or bruits Abdomen: Soft, non tender and non distended. No masses, hepatosplenomegaly or hernias noted. Normal Bowel sounds Musculoskeletal: Symmetrical with no gross deformities  Pulses:  Normal pulses noted Extremities: No clubbing, cyanosis, edema or deformities noted Neurological: Alert oriented x 4, grossly nonfocal Psychological:  Alert and cooperative. Normal mood and affect  Assessment and Recommendations:  1. Chest wall tenderness which is musculoskeletal in nature and may be related to fibromyalgia.  2. GERD. Rule out NSAID-induced gastritis. Minimize NSAIDs if possible. Continue omeprazole 40 mg twice daily and ranitidine 150 mg at bedtime. Carafate suspension 1 g 3 times a day when necessary.  3. Globus  sensation may be related to GERD, ENT disorders or other disorders.  4. Personal history of adenomatous colon polyps. 5 year surveillance colonoscopy due November 2015.

## 2012-07-17 ENCOUNTER — Telehealth: Payer: Self-pay | Admitting: Gastroenterology

## 2012-07-17 MED ORDER — SUCRALFATE 1 GM/10ML PO SUSP: 1.0000 g | Freq: Three times a day (TID) | ORAL | Status: DC

## 2012-07-17 NOTE — Telephone Encounter (Signed)
Pt states her Carafate prescription needs to be sent to [facility] instead of her mail order pharmacy. Told her that I will resend the prescription to the [facility] pharmacy.

## 2012-08-15 ENCOUNTER — Other Ambulatory Visit: Payer: Self-pay | Admitting: Family Medicine

## 2012-08-15 DIAGNOSIS — R109 Unspecified abdominal pain: Secondary | ICD-10-CM

## 2012-08-21 ENCOUNTER — Ambulatory Visit
Admission: RE | Admit: 2012-08-21 | Discharge: 2012-08-21 | Disposition: A | Discharge status: Home / Self Care | Payer: 59 | Source: Ambulatory Visit | Attending: Family Medicine | Admitting: Family Medicine

## 2012-08-21 DIAGNOSIS — R109 Unspecified abdominal pain: Secondary | ICD-10-CM

## 2012-12-13 DIAGNOSIS — IMO0001 Reserved for inherently not codable concepts without codable children: Secondary | ICD-10-CM | POA: Diagnosis not present

## 2012-12-13 DIAGNOSIS — M5030 Other cervical disc degeneration, unspecified cervical region: Secondary | ICD-10-CM | POA: Diagnosis not present

## 2012-12-13 DIAGNOSIS — M5137 Other intervertebral disc degeneration, lumbosacral region: Secondary | ICD-10-CM | POA: Diagnosis not present

## 2012-12-13 DIAGNOSIS — M6740 Ganglion, unspecified site: Secondary | ICD-10-CM | POA: Diagnosis not present

## 2012-12-14 DIAGNOSIS — R5381 Other malaise: Secondary | ICD-10-CM | POA: Diagnosis not present

## 2012-12-14 DIAGNOSIS — E039 Hypothyroidism, unspecified: Secondary | ICD-10-CM | POA: Diagnosis not present

## 2012-12-14 DIAGNOSIS — R5383 Other fatigue: Secondary | ICD-10-CM | POA: Diagnosis not present

## 2012-12-14 DIAGNOSIS — R7309 Other abnormal glucose: Secondary | ICD-10-CM | POA: Diagnosis not present

## 2012-12-14 DIAGNOSIS — R109 Unspecified abdominal pain: Secondary | ICD-10-CM | POA: Diagnosis not present

## 2012-12-18 DIAGNOSIS — M722 Plantar fascial fibromatosis: Secondary | ICD-10-CM | POA: Diagnosis not present

## 2012-12-18 DIAGNOSIS — I69998 Other sequelae following unspecified cerebrovascular disease: Secondary | ICD-10-CM | POA: Diagnosis not present

## 2012-12-18 DIAGNOSIS — M25549 Pain in joints of unspecified hand: Secondary | ICD-10-CM | POA: Diagnosis not present

## 2012-12-18 DIAGNOSIS — M25579 Pain in unspecified ankle and joints of unspecified foot: Secondary | ICD-10-CM | POA: Diagnosis not present

## 2012-12-19 DIAGNOSIS — I69998 Other sequelae following unspecified cerebrovascular disease: Secondary | ICD-10-CM | POA: Diagnosis not present

## 2013-01-11 ENCOUNTER — Encounter: Payer: Self-pay | Admitting: Nurse Practitioner

## 2013-01-11 ENCOUNTER — Ambulatory Visit (INDEPENDENT_AMBULATORY_CARE_PROVIDER_SITE_OTHER): Payer: Medicare Other | Admitting: Nurse Practitioner

## 2013-01-11 VITALS — BP 120/84 | HR 88 | Ht 63.75 in | Wt 171.0 lb

## 2013-01-11 DIAGNOSIS — Z124 Encounter for screening for malignant neoplasm of cervix: Secondary | ICD-10-CM

## 2013-01-11 DIAGNOSIS — Z01419 Encounter for gynecological examination (general) (routine) without abnormal findings: Secondary | ICD-10-CM

## 2013-01-11 DIAGNOSIS — E559 Vitamin D deficiency, unspecified: Secondary | ICD-10-CM | POA: Diagnosis not present

## 2013-01-11 MED ORDER — VALACYCLOVIR HCL 500 MG PO TABS: 500.0000 mg | ORAL_TABLET | Freq: Two times a day (BID) | ORAL | Status: DC

## 2013-01-11 MED ORDER — ESTRADIOL 10 MCG VA TABS: 1.0000 | ORAL_TABLET | VAGINAL | Status: DC

## 2013-01-11 NOTE — Patient Instructions (Addendum)

## 2013-01-11 NOTE — Progress Notes (Signed)
Patient ID: Christina Deleon, female   DOB: 1951-02-07, 62 y.o.   MRN: [id number] 62 y.o. [id number] Married Caucasian Fe here for annual exam.   Only rare flares of HSV.  Doing well for the most part. She is on Medicare disability secondary to fibromyalgia and back pain.  No LMP recorded. Patient has had a hysterectomy.          Sexually active: yes  The current method of family planning is status post hysterectomy.    Exercising: no  The patient does not participate in regular exercise at present. Smoker:  no  Health Maintenance: Pap:  11/23/01, WNL MMG:  10/17/12, normal Colonoscopy:  03/11/09, tubular adenoma, repeat 5 years BMD:   04/10/12 T Score: spine 0.0, right hip neck -1.9; total -1.4 TDaP:  11/20/09 Labs: PCP   reports that she has never smoked. She has never used smokeless tobacco. She reports that she does not drink alcohol or use illicit drugs.  Past Medical History  Diagnosis Date  . Arthritis     osteo  . GERD (gastroesophageal reflux disease)   . Hyperlipidemia   . Hypertension   . Allergy   . Chronic gastritis   . Hemorrhoids   . Sleep disturbance   . Adenomatous colon polyp 10/1997, and 03/2009  . Menorrhagia     partial hysterectomy  . Migraines     Past Surgical History  Procedure Laterality Date  . Vaginal hysterectomy  1998    partial , no cancer, no cervix  . Tonsillectomy    . Turbinate sinus surgery  2003    Current Outpatient Prescriptions  Medication Sig Dispense Refill  . albuterol (PROVENTIL HFA;VENTOLIN HFA) 108 (90 BASE) MCG/ACT inhaler Inhale 2 puffs into the lungs 2 (two) times daily.      . busPIRone (BUSPAR) 10 MG tablet Take 1 tablet by mouth 1-3 times daily      . candesartan (ATACAND) 16 MG tablet Take 16 mg by mouth daily.        . ciclesonide (OMNARIS) 50 MCG/ACT nasal spray Place 2 sprays into both nostrils daily.      Marland Kitchen estradiol (VAGIFEM) 25 MCG vaginal tablet Place 25 mcg vaginally as needed.        . hydrocodone-acetaminophen (HYCET)  7.5-325 MG/15ML solution Take 10 mLs by mouth 4 (four) times daily as needed for pain (or cough).  120 mL  0  . ibuprofen (ADVIL,MOTRIN) 600 MG tablet Take 1 tablet (600 mg total) by mouth every 8 (eight) hours as needed for pain or fever.  20 tablet  0  . loratadine (CLARITIN) 10 MG tablet Take 10 mg by mouth daily.        . metaxalone (SKELAXIN) 800 MG tablet Take 800 mg by mouth 3 (three) times daily as needed.      . montelukast (SINGULAIR) 10 MG tablet Take 10 mg by mouth at bedtime.        Marland Kitchen omeprazole (PRILOSEC) 40 MG capsule Take 1 capsule (40 mg total) by mouth 2 (two) times daily.  180 capsule  3  . ondansetron (ZOFRAN-ODT) 4 MG disintegrating tablet Take 1 tablet (4 mg total) by mouth every 8 (eight) hours as needed for nausea.  10 tablet  0  . pravastatin (PRAVACHOL) 20 MG tablet Take 20 mg by mouth daily.        . ranitidine (ZANTAC) 150 MG tablet Take 1 tablet (150 mg total) by mouth at bedtime.  90 tablet  3  .  sucralfate (CARAFATE) 1 GM/10ML suspension Take 10 mLs (1 g total) by mouth 3 (three) times daily with meals.  420 mL  5  . traMADol (ULTRAM) 50 MG tablet Take 50 mg by mouth every 6 (six) hours as needed.      . zolmitriptan (ZOMIG-ZMT) 2.5 MG disintegrating tablet Take 2.5 mg by mouth. One by mouth at the onset of headache       . zolpidem (AMBIEN) 10 MG tablet 1/2-1 tab at bedtime as needed to help sleep  30 tablet  0  . nortriptyline (PAMELOR) 10 MG capsule Take 10 mg by mouth at bedtime.      . [DISCONTINUED] dexlansoprazole (DEXILANT) 60 MG capsule Take 60 mg by mouth daily.         No current facility-administered medications for this visit.    Family History  Problem Relation Age of Onset  . Throat cancer Father   . Coronary artery disease Father   . Heart attack Father 30  . Stroke Father     during procedure  . Lung cancer Father   . Coronary artery disease Mother   . Heart failure Mother   . Hypertension Mother   . Hyperlipidemia Mother   . Diabetes  Paternal Aunt     x 2  . Diabetes Paternal Grandmother   . Coronary artery disease Paternal Grandmother   . Stroke Maternal Grandmother     or MI  . Prostate cancer Paternal Uncle     ROS:  Pertinent items are noted in HPI.  Otherwise, a comprehensive ROS was negative.  Exam:   BP 120/84  Pulse 88  Ht 5' 3.75" (1.619 m)  Wt 171 lb (77.565 kg)  BMI 29.59 kg/m2 Height: 5' 3.75" (161.9 cm)  Ht Readings from Last 3 Encounters:  01/11/13 5' 3.75" (1.619 m)  07/10/12 5\' 4"  (1.626 m)  07/21/11 5\' 4"  (1.626 m)    General appearance: alert, cooperative and appears stated age Head: Normocephalic, without obvious abnormality, atraumatic Neck: no adenopathy, supple, symmetrical, trachea midline and thyroid normal to inspection and palpation Lungs: clear to auscultation bilaterally Breasts: normal appearance, no masses or tenderness Heart: regular rate and rhythm Abdomen: soft, non-tender; no masses,  no organomegaly Extremities: extremities normal, atraumatic, no cyanosis or edema Skin: Skin color, texture, turgor normal. No rashes or lesions Lymph nodes: Cervical, supraclavicular, and axillary nodes normal. No abnormal inguinal nodes palpated Neurologic: Grossly normal   Pelvic: External genitalia:  no lesions              Urethra:  normal appearing urethra with no masses, tenderness or lesions              Bartholin's and Skene's: normal                 Vagina: normal appearing vagina with normal color and discharge, no lesions              Cervix: absent              Pap taken: no Bimanual Exam:  Uterus:  uterus absent              Adnexa: no mass, fullness, tenderness               Rectovaginal: Confirms               Anus:  normal sphincter tone, no lesions  A:  Well Woman with normal exam  S/P TVH secondary to prolapse, ovaries  remain 1996  Vit D deficiency  History of fibromyalgia, GERD, back pain  History of HSV - genital  Atrophic vaginitis  P:   Pap smear as per  guidelines   Recheck Vit D and follow protocol  Mammogram due 6/15  Refill Valtrex 500 mg for a year  Refill on Vagifem X 1 year - did well with this last year  Counseled on breast self exam, osteoporosis, adequate intake of calcium and vitamin D, diet and exercise, Kegel's exercises return annually or prn  An After Visit Summary was printed and given to the patient.

## 2013-01-17 NOTE — Progress Notes (Signed)
Encounter reviewed by Dr. Brook Silva.  

## 2013-02-26 DIAGNOSIS — H1045 Other chronic allergic conjunctivitis: Secondary | ICD-10-CM | POA: Diagnosis not present

## 2013-02-26 DIAGNOSIS — H04129 Dry eye syndrome of unspecified lacrimal gland: Secondary | ICD-10-CM | POA: Diagnosis not present

## 2013-02-26 DIAGNOSIS — H40019 Open angle with borderline findings, low risk, unspecified eye: Secondary | ICD-10-CM | POA: Diagnosis not present

## 2013-03-15 DIAGNOSIS — M19049 Primary osteoarthritis, unspecified hand: Secondary | ICD-10-CM | POA: Diagnosis not present

## 2013-03-15 DIAGNOSIS — M1710 Unilateral primary osteoarthritis, unspecified knee: Secondary | ICD-10-CM | POA: Diagnosis not present

## 2013-03-15 DIAGNOSIS — M5030 Other cervical disc degeneration, unspecified cervical region: Secondary | ICD-10-CM | POA: Diagnosis not present

## 2013-03-15 DIAGNOSIS — M542 Cervicalgia: Secondary | ICD-10-CM | POA: Diagnosis not present

## 2013-03-15 DIAGNOSIS — M5137 Other intervertebral disc degeneration, lumbosacral region: Secondary | ICD-10-CM | POA: Diagnosis not present

## 2013-03-28 ENCOUNTER — Encounter (HOSPITAL_COMMUNITY): Payer: Self-pay | Admitting: Emergency Medicine

## 2013-03-28 ENCOUNTER — Emergency Department (INDEPENDENT_AMBULATORY_CARE_PROVIDER_SITE_OTHER)
Admission: EM | Admit: 2013-03-28 | Discharge: 2013-03-28 | Disposition: A | Discharge status: Home / Self Care | Payer: Medicare Other | Source: Home / Self Care | Attending: Emergency Medicine | Admitting: Emergency Medicine

## 2013-03-28 DIAGNOSIS — G43909 Migraine, unspecified, not intractable, without status migrainosus: Secondary | ICD-10-CM

## 2013-03-28 MED ORDER — ZOLMITRIPTAN 2.5 MG PO TBDP: 2.5000 mg | ORAL_TABLET | ORAL | Status: DC | PRN

## 2013-03-28 MED ORDER — DEXAMETHASONE SODIUM PHOSPHATE 10 MG/ML IJ SOLN
10.0000 mg | Freq: Once | INTRAMUSCULAR | Status: AC
  Administered 2013-03-28: 10 mg via INTRAMUSCULAR

## 2013-03-28 MED ORDER — KETOROLAC TROMETHAMINE 30 MG/ML IJ SOLN
INTRAMUSCULAR | Status: AC
  Filled 2013-03-28: qty 1

## 2013-03-28 MED ORDER — METOCLOPRAMIDE HCL 5 MG/ML IJ SOLN
10.0000 mg | Freq: Once | INTRAMUSCULAR | Status: AC
  Administered 2013-03-28: 10 mg via INTRAMUSCULAR

## 2013-03-28 MED ORDER — PREDNISONE 20 MG PO TABS: 20.0000 mg | ORAL_TABLET | Freq: Two times a day (BID) | ORAL | Status: DC

## 2013-03-28 MED ORDER — OXYCODONE-ACETAMINOPHEN 5-325 MG PO TABS: ORAL_TABLET | ORAL | Status: DC

## 2013-03-28 MED ORDER — KETOROLAC TROMETHAMINE 60 MG/2ML IM SOLN
30.0000 mg | Freq: Once | INTRAMUSCULAR | Status: AC
  Administered 2013-03-28: 30 mg via INTRAMUSCULAR

## 2013-03-28 MED ORDER — DEXAMETHASONE SODIUM PHOSPHATE 10 MG/ML IJ SOLN
INTRAMUSCULAR | Status: AC
  Filled 2013-03-28: qty 1

## 2013-03-28 MED ORDER — METOCLOPRAMIDE HCL 5 MG/ML IJ SOLN
INTRAMUSCULAR | Status: AC
  Filled 2013-03-28: qty 2

## 2013-03-28 NOTE — ED Provider Notes (Signed)
Chief Complaint:   Chief Complaint  Patient presents with  . Migraine    History of Present Illness:   Christina Deleon is a 62 year old female who is well-known to me through been a previous patient of mine. She presents today with a five-day history of a migraine headache. She blames stress and weather for this attack. She's had migraines for years. She's been on as needed Zomig and nortriptyline amitriptyline, although she's not taking any preventive medication right now. She tried the Zomig without relief. The pain is bifrontal and around her right eye. It is throbbing and pounding associated with nausea, photophobia, and phonophobia. She denies any visual symptoms, paresthesias, weakness, difficulty with speech, ambulation, or any other neurological symptoms. She's had no fever, chills, or stiff neck. She does mention a sensation of a lump in her throat is been going on for weeks or months. She has a history of reflux and takes Prilosec and Zantac.  Review of Systems:  Other than noted above, the patient denies any of the following symptoms: Systemic:  No fever, chills, fatigue, photophobia, stiff neck. Eye:  No redness, eye pain, discharge, blurred vision, or diplopia. ENT:  No nasal congestion, rhinorrhea, sinus pressure or pain, sneezing, earache, or sore throat.  No jaw claudication. Neuro:  No paresthesias, loss of consciousness, seizure activity, muscle weakness, trouble with coordination or gait, trouble speaking or swallowing. Psych:  No depression, anxiety or trouble sleeping.  PMFSH:  Past medical history, family history, social history, meds, and allergies were reviewed.  She has no medication allergies. She has fibromyalgia, hypertension, hypercholesterolemia, and gastroesophageal reflux. She currently takes Prilosec, Zantac, Atacand, pravastatin, nabumetone, Skelaxin, Ambien, and alprazolam.  Physical Exam:   Vital signs:  BP 126/82  Pulse 85  Temp(Src) 98.8 F (37.1 C) (Oral)   Resp 16  SpO2 99% General:  Alert and oriented.  In no distress. Eye:  Lids and conjunctivas normal.  PERRL,  Full EOMs.  Fundi benign with normal discs and vessels. ENT:  No cranial or facial tenderness to palpation.  TMs and canals clear.  Nasal mucosa was normal and uncongested without any drainage. No intra oral lesions, pharynx clear, mucous membranes moist, dentition normal. Neck:  Supple, full ROM, no tenderness to palpation.  No adenopathy or mass. Neuro:  Alert and orented times 3.  Speech was clear, fluent, and appropriate.  Cranial nerves intact. No pronator drift, muscle strength normal. Finger to nose normal.  DTRs were 2+ and symmetrical.Station and gait were normal.  Romberg's sign was normal.  Able to perform tandem gait well. Psych:  Normal affect.  Course in Urgent Care Center:   She was given Toradol 30 mg IM, Reglan 10 mg IM, and Decadron 10 mg IM.  Assessment:  The encounter diagnosis was Migraine headache.  Plan:   1.  Meds:  The following meds were prescribed:   New Prescriptions   OXYCODONE-ACETAMINOPHEN (PERCOCET) 5-325 MG PER TABLET    1 to 2 tablets every 6 hours as needed for pain.   PREDNISONE (DELTASONE) 20 MG TABLET    Take 1 tablet (20 mg total) by mouth 2 (two) times daily.   ZOLMITRIPTAN (ZOMIG ZMT) 2.5 MG DISINTEGRATING TABLET    Take 1 tablet (2.5 mg total) by mouth as needed for migraine.    2.  Patient Education/Counseling:  The patient was given appropriate handouts, self care instructions, and instructed in symptomatic relief. Instructed to get extra rest and fluids today. May use the oxycodone as a  backup, although she may try the Zomig. Will treat with a five-day course of prednisone, since this has been going on for a while and she needs something to break the migraine cycle.  3.  Follow up:  The patient was told to follow up if no better in 3 to 4 days, if becoming worse in any way, and given some red flag symptoms such as fever or new neurological  symptoms which would prompt immediate return.  Follow up here or with her primary care physician as needed.     Reuben Likes, MD 03/28/13 [phone]

## 2013-03-28 NOTE — ED Notes (Signed)
C/o 5 day duration of MHA w no relief w her usual Rx

## 2013-04-12 DIAGNOSIS — Z0189 Encounter for other specified special examinations: Secondary | ICD-10-CM | POA: Diagnosis not present

## 2013-04-12 DIAGNOSIS — I10 Essential (primary) hypertension: Secondary | ICD-10-CM | POA: Diagnosis not present

## 2013-04-12 DIAGNOSIS — J3089 Other allergic rhinitis: Secondary | ICD-10-CM | POA: Diagnosis not present

## 2013-04-12 DIAGNOSIS — R002 Palpitations: Secondary | ICD-10-CM | POA: Diagnosis not present

## 2013-04-12 DIAGNOSIS — G43009 Migraine without aura, not intractable, without status migrainosus: Secondary | ICD-10-CM | POA: Diagnosis not present

## 2013-04-16 DIAGNOSIS — G4700 Insomnia, unspecified: Secondary | ICD-10-CM | POA: Diagnosis not present

## 2013-04-16 DIAGNOSIS — G44329 Chronic post-traumatic headache, not intractable: Secondary | ICD-10-CM | POA: Diagnosis not present

## 2013-04-16 DIAGNOSIS — R0989 Other specified symptoms and signs involving the circulatory and respiratory systems: Secondary | ICD-10-CM | POA: Diagnosis not present

## 2013-04-16 DIAGNOSIS — R0609 Other forms of dyspnea: Secondary | ICD-10-CM | POA: Diagnosis not present

## 2013-04-16 DIAGNOSIS — F411 Generalized anxiety disorder: Secondary | ICD-10-CM | POA: Diagnosis not present

## 2013-05-22 DIAGNOSIS — I10 Essential (primary) hypertension: Secondary | ICD-10-CM | POA: Diagnosis not present

## 2013-05-22 DIAGNOSIS — J3089 Other allergic rhinitis: Secondary | ICD-10-CM | POA: Diagnosis not present

## 2013-05-22 DIAGNOSIS — G43009 Migraine without aura, not intractable, without status migrainosus: Secondary | ICD-10-CM | POA: Diagnosis not present

## 2013-05-22 DIAGNOSIS — K219 Gastro-esophageal reflux disease without esophagitis: Secondary | ICD-10-CM | POA: Diagnosis not present

## 2013-05-24 DIAGNOSIS — M25549 Pain in joints of unspecified hand: Secondary | ICD-10-CM | POA: Diagnosis not present

## 2013-05-24 DIAGNOSIS — M5137 Other intervertebral disc degeneration, lumbosacral region: Secondary | ICD-10-CM | POA: Diagnosis not present

## 2013-05-24 DIAGNOSIS — M5030 Other cervical disc degeneration, unspecified cervical region: Secondary | ICD-10-CM | POA: Diagnosis not present

## 2013-05-24 DIAGNOSIS — IMO0001 Reserved for inherently not codable concepts without codable children: Secondary | ICD-10-CM | POA: Diagnosis not present

## 2013-05-29 DIAGNOSIS — R29898 Other symptoms and signs involving the musculoskeletal system: Secondary | ICD-10-CM | POA: Diagnosis not present

## 2013-05-29 DIAGNOSIS — M1610 Unilateral primary osteoarthritis, unspecified hip: Secondary | ICD-10-CM | POA: Diagnosis not present

## 2013-05-29 DIAGNOSIS — M169 Osteoarthritis of hip, unspecified: Secondary | ICD-10-CM | POA: Diagnosis not present

## 2013-06-14 DIAGNOSIS — J0100 Acute maxillary sinusitis, unspecified: Secondary | ICD-10-CM | POA: Diagnosis not present

## 2013-06-14 DIAGNOSIS — J069 Acute upper respiratory infection, unspecified: Secondary | ICD-10-CM | POA: Diagnosis not present

## 2013-06-14 DIAGNOSIS — J3089 Other allergic rhinitis: Secondary | ICD-10-CM | POA: Diagnosis not present

## 2013-06-26 DIAGNOSIS — Z79899 Other long term (current) drug therapy: Secondary | ICD-10-CM | POA: Diagnosis not present

## 2013-06-26 DIAGNOSIS — R5381 Other malaise: Secondary | ICD-10-CM | POA: Diagnosis not present

## 2013-06-26 DIAGNOSIS — R5383 Other fatigue: Secondary | ICD-10-CM | POA: Diagnosis not present

## 2013-07-17 DIAGNOSIS — J3089 Other allergic rhinitis: Secondary | ICD-10-CM | POA: Diagnosis not present

## 2013-07-17 DIAGNOSIS — M1710 Unilateral primary osteoarthritis, unspecified knee: Secondary | ICD-10-CM | POA: Diagnosis not present

## 2013-07-17 DIAGNOSIS — K219 Gastro-esophageal reflux disease without esophagitis: Secondary | ICD-10-CM | POA: Diagnosis not present

## 2013-07-17 DIAGNOSIS — K5900 Constipation, unspecified: Secondary | ICD-10-CM | POA: Diagnosis not present

## 2013-07-17 DIAGNOSIS — M461 Sacroiliitis, not elsewhere classified: Secondary | ICD-10-CM | POA: Diagnosis not present

## 2013-07-17 DIAGNOSIS — I10 Essential (primary) hypertension: Secondary | ICD-10-CM | POA: Diagnosis not present

## 2013-07-17 DIAGNOSIS — IMO0001 Reserved for inherently not codable concepts without codable children: Secondary | ICD-10-CM | POA: Diagnosis not present

## 2013-07-17 DIAGNOSIS — M19049 Primary osteoarthritis, unspecified hand: Secondary | ICD-10-CM | POA: Diagnosis not present

## 2013-07-25 DIAGNOSIS — J209 Acute bronchitis, unspecified: Secondary | ICD-10-CM | POA: Diagnosis not present

## 2013-07-25 DIAGNOSIS — J019 Acute sinusitis, unspecified: Secondary | ICD-10-CM | POA: Diagnosis not present

## 2013-08-01 ENCOUNTER — Emergency Department (INDEPENDENT_AMBULATORY_CARE_PROVIDER_SITE_OTHER)
Admission: EM | Admit: 2013-08-01 | Discharge: 2013-08-01 | Disposition: A | Discharge status: Home / Self Care | Payer: Medicare Other | Source: Home / Self Care | Attending: Family Medicine | Admitting: Family Medicine

## 2013-08-01 ENCOUNTER — Encounter (HOSPITAL_COMMUNITY): Payer: Self-pay | Admitting: Emergency Medicine

## 2013-08-01 DIAGNOSIS — R059 Cough, unspecified: Secondary | ICD-10-CM | POA: Diagnosis not present

## 2013-08-01 DIAGNOSIS — J40 Bronchitis, not specified as acute or chronic: Secondary | ICD-10-CM

## 2013-08-01 DIAGNOSIS — R05 Cough: Secondary | ICD-10-CM | POA: Diagnosis not present

## 2013-08-01 MED ORDER — PREDNISONE 50 MG PO TABS: 50.0000 mg | ORAL_TABLET | Freq: Every day | ORAL | Status: DC

## 2013-08-01 MED ORDER — HYDROCOD POLST-CHLORPHEN POLST 10-8 MG/5ML PO LQCR: 5.0000 mL | Freq: Two times a day (BID) | ORAL | Status: DC | PRN

## 2013-08-01 MED ORDER — FLUTICASONE PROPIONATE 50 MCG/ACT NA SUSP: 2.0000 | Freq: Every day | NASAL | Status: DC

## 2013-08-01 MED ORDER — ALBUTEROL SULFATE HFA 108 (90 BASE) MCG/ACT IN AERS: 2.0000 | INHALATION_SPRAY | Freq: Four times a day (QID) | RESPIRATORY_TRACT | Status: DC | PRN

## 2013-08-01 MED ORDER — POLYETHYLENE GLYCOL 3350 17 GM/SCOOP PO POWD: 17.0000 g | Freq: Every day | ORAL | Status: DC

## 2013-08-01 MED ORDER — GUAIFENESIN-CODEINE 100-10 MG/5ML PO SOLN: 5.0000 mL | Freq: Every evening | ORAL | Status: DC | PRN

## 2013-08-01 MED ORDER — IPRATROPIUM-ALBUTEROL 0.5-2.5 (3) MG/3ML IN SOLN
3.0000 mL | Freq: Once | RESPIRATORY_TRACT | Status: AC
  Administered 2013-08-01: 3 mL via RESPIRATORY_TRACT

## 2013-08-01 MED ORDER — IPRATROPIUM-ALBUTEROL 0.5-2.5 (3) MG/3ML IN SOLN
RESPIRATORY_TRACT | Status: AC
  Filled 2013-08-01: qty 3

## 2013-08-01 NOTE — Discharge Instructions (Signed)
Thank you for coming in today. Take medications as directed.  Return if not getting better Call or go to the emergency room if you get worse, have trouble breathing, have chest pains, or palpitations.    Bronchitis Bronchitis is inflammation of the airways that extend from the windpipe into the lungs (bronchi). The inflammation often causes mucus to develop, which leads to a cough. If the inflammation becomes severe, it may cause shortness of breath. CAUSES  Bronchitis may be caused by:   Viral infections.   Bacteria.   Cigarette smoke.   Allergens, pollutants, and other irritants.  SIGNS AND SYMPTOMS  The most common symptom of bronchitis is a frequent cough that produces mucus. Other symptoms include:  Fever.   Body aches.   Chest congestion.   Chills.   Shortness of breath.   Sore throat.  DIAGNOSIS  Bronchitis is usually diagnosed through a medical history and physical exam. Tests, such as chest X-rays, are sometimes done to rule out other conditions.  TREATMENT  You may need to avoid contact with whatever caused the problem (smoking, for example). Medicines are sometimes needed. These may include:  Antibiotics. These may be prescribed if the condition is caused by bacteria.  Cough suppressants. These may be prescribed for relief of cough symptoms.   Inhaled medicines. These may be prescribed to help open your airways and make it easier for you to breathe.   Steroid medicines. These may be prescribed for those with recurrent (chronic) bronchitis. HOME CARE INSTRUCTIONS  Get plenty of rest.   Drink enough fluids to keep your urine clear or pale yellow (unless you have a medical condition that requires fluid restriction). Increasing fluids may help thin your secretions and will prevent dehydration.   Only take over-the-counter or prescription medicines as directed by your health care provider.  Only take antibiotics as directed. Make sure you finish  them even if you start to feel better.  Avoid secondhand smoke, irritating chemicals, and strong fumes. These will make bronchitis worse. If you are a smoker, quit smoking. Consider using nicotine gum or skin patches to help control withdrawal symptoms. Quitting smoking will help your lungs heal faster.   Put a cool-mist humidifier in your bedroom at night to moisten the air. This may help loosen mucus. Change the water in the humidifier daily. You can also run the hot water in your shower and sit in the bathroom with the door closed for 5 10 minutes.   Follow up with your health care provider as directed.   Wash your hands frequently to avoid catching bronchitis again or spreading an infection to others.  SEEK MEDICAL CARE IF: Your symptoms do not improve after 1 week of treatment.  SEEK IMMEDIATE MEDICAL CARE IF:  Your fever increases.  You have chills.   You have chest pain.   You have worsening shortness of breath.   You have bloody sputum.  You faint.  You have lightheadedness.  You have a severe headache.   You vomit repeatedly. MAKE SURE YOU:   Understand these instructions.  Will watch your condition.  Will get help right away if you are not doing well or get worse. Document Released: 04/19/2005 Document Revised: 02/07/2013 Document Reviewed: 12/12/2012 [facility] Patient Information 2014 [place].

## 2013-08-01 NOTE — ED Notes (Signed)
C/o sinus

## 2013-08-01 NOTE — ED Provider Notes (Signed)
Christina Deleon is a 63 y.o. female who presents to Urgent Care today for cough congestion itchy scratchy throat. [place] and was given a five-day course of 20 mg of prednisone as well as doxycycline and Mucinex. This has not helped much. She continues to have a bothersome persistent nonproductive cough. She denies any shortness of breath. She denies any fevers or chills. The cough is bothersome and interfering with sleep. She has not used her albuterol inhaler yet. She denies any wheezing.  Patient additionally has mild constipation. She denies any abdominal pain   Past Medical History  Diagnosis Date  . Arthritis     osteo  . GERD (gastroesophageal reflux disease)   . Hyperlipidemia   . Hypertension   . Allergy   . Chronic gastritis   . Hemorrhoids   . Sleep disturbance   . Adenomatous colon polyp 10/1997, and 03/2009  . Menorrhagia     partial hysterectomy  . Migraines   . 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 Smoking status: Never Smoker   . Smokeless tobacco: Never Used  . Alcohol Use: No   ROS as above Medications: No current facility-administered medications for this encounter.   Current Outpatient Prescriptions  Medication Sig Dispense Refill  . albuterol (PROVENTIL HFA;VENTOLIN HFA) 108 (90 BASE) MCG/ACT inhaler Inhale 2 puffs into the lungs 2 (two) times daily.      . busPIRone (BUSPAR) 10 MG tablet Take 1 tablet by mouth 1-3 times daily      . candesartan (ATACAND) 16 MG tablet Take 16 mg by mouth daily.        . ciclesonide (OMNARIS) 50 MCG/ACT nasal spray Place 2 sprays into both nostrils daily.      . Estradiol 10 MCG TABS vaginal tablet Place 1 tablet (10 mcg total) vaginally 2 (two) times a week.  24 tablet  3  . hydrocodone-acetaminophen (HYCET) 7.5-325 MG/15ML solution Take 10 mLs by mouth 4 (four) times daily as needed  for pain (or cough).  120 mL  0  . ibuprofen (ADVIL,MOTRIN) 600 MG tablet Take 1 tablet (600 mg total) by mouth every 8 (eight) hours as needed for pain or fever.  20 tablet  0  . loratadine (CLARITIN) 10 MG tablet Take 10 mg by mouth daily.        . metaxalone (SKELAXIN) 800 MG tablet Take 800 mg by mouth 3 (three) times daily as needed.      . montelukast (SINGULAIR) 10 MG tablet Take 10 mg by mouth at bedtime.        . nortriptyline (PAMELOR) 10 MG capsule Take 10 mg by mouth at bedtime.      Marland Kitchen omeprazole (PRILOSEC) 40 MG capsule Take 1 capsule (40 mg total) by mouth 2 (two) times daily.  180 capsule  3  . ondansetron (ZOFRAN-ODT) 4 MG disintegrating tablet Take 1 tablet (4 mg total) by mouth every 8 (eight) hours as needed for nausea.  10 tablet  0  . oxyCODONE-acetaminophen (PERCOCET) 5-325 MG per tablet 1 to 2 tablets every 6 hours as needed for pain.  15 tablet  0  . pravastatin (PRAVACHOL) 20 MG tablet Take 20 mg by mouth daily.        . predniSONE (DELTASONE) 20 MG tablet Take 1 tablet (20 mg total) by mouth  2 (two) times daily.  10 tablet  0  . ranitidine (ZANTAC) 150 MG tablet Take 1 tablet (150 mg total) by mouth at bedtime.  90 tablet  3  . sucralfate (CARAFATE) 1 GM/10ML suspension Take 10 mLs (1 g total) by mouth 3 (three) times daily with meals.  420 mL  5  . traMADol (ULTRAM) 50 MG tablet Take 50 mg by mouth every 6 (six) hours as needed.      . valACYclovir (VALTREX) 500 MG tablet Take 1 tablet (500 mg total) by mouth 2 (two) times daily. Take as directed.  30 tablet  6  . zolmitriptan (ZOMIG ZMT) 2.5 MG disintegrating tablet Take 1 tablet (2.5 mg total) by mouth as needed for migraine.  10 tablet  1  . zolmitriptan (ZOMIG-ZMT) 2.5 MG disintegrating tablet Take 2.5 mg by mouth. One by mouth at the onset of headache       . zolpidem (AMBIEN) 10 MG tablet 1/2-1 tab at bedtime as needed to help sleep  30 tablet  0  . [DISCONTINUED] dexlansoprazole (DEXILANT) 60 MG capsule Take 60 mg  by mouth daily.          Exam:  BP 168/84  Pulse 91  Temp(Src) 98.5 F (36.9 C) (Oral)  Resp 19  SpO2 98% Gen: Well NAD HEENT: EOMI,  MMM posterior pharynx cobblestoning. Normal tympanic membranes bilaterally Lungs: Normal work of breathing. CTABL no wheezing Heart: RRR no MRG Abd: NABS, Soft. NT, ND Exts: Brisk capillary refill, warm and well perfused.   Patient was given a DuoNeb nebulizer treatment and felt somewhat better.  No results found for this or any previous visit (from the past 24 hour(s)). No results found.  Assessment and Plan: 63 y.o. female with bronchitis. Plan to treat with higher dose prednisone, albuterol, Flonase nasal spray, Tussionex. Treat constipation with MiraLAX  Discussed warning signs or symptoms. Please see discharge instructions. Patient expresses understanding.    Gregor Hams, MD 08/01/13 [phone]

## 2013-08-04 ENCOUNTER — Telehealth (HOSPITAL_COMMUNITY): Payer: Self-pay | Admitting: *Deleted

## 2013-08-04 NOTE — ED Notes (Signed)
Returned call to pt; pt unavailable - spoke with spouse.  C/O constipation despite using Miralax as directed.  States she has had constipation problems in past.  Describes fecal impaction without success of digital removal.  Recommended trying enema to soften stool, then attempt digital removal.  If she is still unable to remove stool, or has abdominal pain that is new, recommended she go to ED to ensure no blockage.  Also c/o not much relief of cough/congestion since yesterday.  Explained need to give steroid some time to work, and that viruses often cause sxs for several days.  States taking 1/2 dose Rx Tussionex, and it's not working well.  Instructed to take full dose as directed, and she may take OTC plain dextromethorphan in between prn.  Instructed to f/u w/ PCP if no improvement next week, or to go to ED for any worsening sxs.  Spouse verbalized understanding.

## 2013-08-07 ENCOUNTER — Emergency Department (INDEPENDENT_AMBULATORY_CARE_PROVIDER_SITE_OTHER)
Admission: EM | Admit: 2013-08-07 | Discharge: 2013-08-07 | Disposition: A | Discharge status: Home / Self Care | Payer: Medicare Other | Source: Home / Self Care | Attending: Family Medicine | Admitting: Family Medicine

## 2013-08-07 ENCOUNTER — Encounter (HOSPITAL_COMMUNITY): Payer: Self-pay | Admitting: Emergency Medicine

## 2013-08-07 ENCOUNTER — Emergency Department (INDEPENDENT_AMBULATORY_CARE_PROVIDER_SITE_OTHER): Payer: Medicare Other

## 2013-08-07 ENCOUNTER — Emergency Department (HOSPITAL_COMMUNITY)
Admission: EM | Admit: 2013-08-07 | Discharge: 2013-08-07 | Disposition: A | Discharge status: Home / Self Care | Payer: Medicare Other | Attending: Emergency Medicine | Admitting: Emergency Medicine

## 2013-08-07 DIAGNOSIS — E785 Hyperlipidemia, unspecified: Secondary | ICD-10-CM | POA: Insufficient documentation

## 2013-08-07 DIAGNOSIS — R05 Cough: Secondary | ICD-10-CM | POA: Diagnosis not present

## 2013-08-07 DIAGNOSIS — R5383 Other fatigue: Secondary | ICD-10-CM | POA: Diagnosis not present

## 2013-08-07 DIAGNOSIS — G43909 Migraine, unspecified, not intractable, without status migrainosus: Secondary | ICD-10-CM

## 2013-08-07 DIAGNOSIS — Z860100 Personal history of colon polyps, unspecified: Secondary | ICD-10-CM | POA: Insufficient documentation

## 2013-08-07 DIAGNOSIS — K219 Gastro-esophageal reflux disease without esophagitis: Secondary | ICD-10-CM | POA: Diagnosis not present

## 2013-08-07 DIAGNOSIS — IMO0002 Reserved for concepts with insufficient information to code with codable children: Secondary | ICD-10-CM | POA: Diagnosis not present

## 2013-08-07 DIAGNOSIS — J309 Allergic rhinitis, unspecified: Secondary | ICD-10-CM

## 2013-08-07 DIAGNOSIS — R0602 Shortness of breath: Secondary | ICD-10-CM | POA: Diagnosis not present

## 2013-08-07 DIAGNOSIS — I10 Essential (primary) hypertension: Secondary | ICD-10-CM | POA: Diagnosis not present

## 2013-08-07 DIAGNOSIS — R51 Headache: Secondary | ICD-10-CM | POA: Diagnosis not present

## 2013-08-07 DIAGNOSIS — J302 Other seasonal allergic rhinitis: Secondary | ICD-10-CM

## 2013-08-07 DIAGNOSIS — Z8742 Personal history of other diseases of the female genital tract: Secondary | ICD-10-CM | POA: Insufficient documentation

## 2013-08-07 DIAGNOSIS — R112 Nausea with vomiting, unspecified: Secondary | ICD-10-CM | POA: Diagnosis not present

## 2013-08-07 DIAGNOSIS — R0789 Other chest pain: Secondary | ICD-10-CM | POA: Diagnosis not present

## 2013-08-07 DIAGNOSIS — B370 Candidal stomatitis: Secondary | ICD-10-CM | POA: Diagnosis not present

## 2013-08-07 DIAGNOSIS — R079 Chest pain, unspecified: Secondary | ICD-10-CM | POA: Diagnosis not present

## 2013-08-07 DIAGNOSIS — Z8619 Personal history of other infectious and parasitic diseases: Secondary | ICD-10-CM | POA: Insufficient documentation

## 2013-08-07 DIAGNOSIS — Z79899 Other long term (current) drug therapy: Secondary | ICD-10-CM | POA: Insufficient documentation

## 2013-08-07 DIAGNOSIS — B3781 Candidal esophagitis: Secondary | ICD-10-CM | POA: Diagnosis not present

## 2013-08-07 DIAGNOSIS — R5381 Other malaise: Secondary | ICD-10-CM | POA: Insufficient documentation

## 2013-08-07 DIAGNOSIS — G479 Sleep disorder, unspecified: Secondary | ICD-10-CM | POA: Diagnosis not present

## 2013-08-07 DIAGNOSIS — M1990 Unspecified osteoarthritis, unspecified site: Secondary | ICD-10-CM | POA: Insufficient documentation

## 2013-08-07 DIAGNOSIS — R059 Cough, unspecified: Secondary | ICD-10-CM | POA: Diagnosis not present

## 2013-08-07 DIAGNOSIS — R519 Headache, unspecified: Secondary | ICD-10-CM

## 2013-08-07 LAB — I-STAT TROPONIN, ED: TROPONIN I, POC: 0 ng/mL (ref 0.00–0.08)

## 2013-08-07 LAB — POCT RAPID STREP A: Streptococcus, Group A Screen (Direct): NEGATIVE

## 2013-08-07 MED ORDER — DIPHENHYDRAMINE HCL 50 MG/ML IJ SOLN
25.0000 mg | Freq: Once | INTRAMUSCULAR | Status: AC
  Administered 2013-08-07: 25 mg via INTRAVENOUS
  Filled 2013-08-07: qty 1

## 2013-08-07 MED ORDER — DEXAMETHASONE SODIUM PHOSPHATE 10 MG/ML IJ SOLN
10.0000 mg | Freq: Once | INTRAMUSCULAR | Status: AC
  Administered 2013-08-07: 10 mg via INTRAVENOUS
  Filled 2013-08-07: qty 1

## 2013-08-07 MED ORDER — FLUTICASONE PROPIONATE 50 MCG/ACT NA SUSP: 1.0000 | Freq: Two times a day (BID) | NASAL | Status: DC

## 2013-08-07 MED ORDER — KETOROLAC TROMETHAMINE 30 MG/ML IJ SOLN
30.0000 mg | Freq: Once | INTRAMUSCULAR | Status: AC
  Administered 2013-08-07: 30 mg via INTRAVENOUS
  Filled 2013-08-07: qty 1

## 2013-08-07 MED ORDER — METHYLPREDNISOLONE ACETATE 40 MG/ML IJ SUSP
80.0000 mg | Freq: Once | INTRAMUSCULAR | Status: AC
  Administered 2013-08-07: 80 mg via INTRAMUSCULAR

## 2013-08-07 MED ORDER — ONDANSETRON 4 MG PO TBDP
ORAL_TABLET | ORAL | Status: AC
  Filled 2013-08-07: qty 1

## 2013-08-07 MED ORDER — IPRATROPIUM-ALBUTEROL 0.5-2.5 (3) MG/3ML IN SOLN
3.0000 mL | RESPIRATORY_TRACT | Status: DC
  Administered 2013-08-07: 3 mL via RESPIRATORY_TRACT
  Filled 2013-08-07: qty 3

## 2013-08-07 MED ORDER — METHYLPREDNISOLONE ACETATE 80 MG/ML IJ SUSP
INTRAMUSCULAR | Status: AC
  Filled 2013-08-07: qty 1

## 2013-08-07 MED ORDER — DIPHENHYDRAMINE HCL 50 MG/ML IJ SOLN
50.0000 mg | Freq: Once | INTRAMUSCULAR | Status: AC
  Administered 2013-08-07: 50 mg via INTRAMUSCULAR

## 2013-08-07 MED ORDER — ONDANSETRON 4 MG PO TBDP
4.0000 mg | ORAL_TABLET | Freq: Once | ORAL | Status: AC
  Administered 2013-08-07: 4 mg via ORAL

## 2013-08-07 MED ORDER — CETIRIZINE HCL 10 MG PO TABS: 10.0000 mg | ORAL_TABLET | Freq: Every day | ORAL | Status: AC

## 2013-08-07 MED ORDER — PROCHLORPERAZINE EDISYLATE 5 MG/ML IJ SOLN
10.0000 mg | Freq: Once | INTRAMUSCULAR | Status: AC
  Administered 2013-08-07: 10 mg via INTRAVENOUS
  Filled 2013-08-07: qty 2

## 2013-08-07 MED ORDER — DIPHENHYDRAMINE HCL 50 MG/ML IJ SOLN
INTRAMUSCULAR | Status: AC
  Filled 2013-08-07: qty 1

## 2013-08-07 MED ORDER — FLUCONAZOLE 100 MG PO TABS: 100.0000 mg | ORAL_TABLET | Freq: Every day | ORAL | Status: DC

## 2013-08-07 NOTE — Discharge Instructions (Signed)
Migraine Headache A migraine headache is an intense, throbbing pain on one or both sides of your head. A migraine can last for 30 minutes to several hours. CAUSES  The exact cause of a migraine headache is not always known. However, a migraine may be caused when nerves in the brain become irritated and release chemicals that cause inflammation. This causes pain. Certain things may also trigger migraines, such as:  Alcohol.  Smoking.  Stress.  Menstruation.  Aged cheeses.  Foods or drinks that contain nitrates, glutamate, aspartame, or tyramine.  Lack of sleep.  Chocolate.  Caffeine.  Hunger.  Physical exertion.  Fatigue.  Medicines used to treat chest pain (nitroglycerine), birth control pills, estrogen, and some blood pressure medicines. SIGNS AND SYMPTOMS  Pain on one or both sides of your head.  Pulsating or throbbing pain.  Severe pain that prevents daily activities.  Pain that is aggravated by any physical activity.  Nausea, vomiting, or both.  Dizziness.  Pain with exposure to bright lights, loud noises, or activity.  General sensitivity to bright lights, loud noises, or smells. Before you get a migraine, you may get warning signs that a migraine is coming (aura). An aura may include:  Seeing flashing lights.  Seeing bright spots, halos, or zig-zag lines.  Having tunnel vision or blurred vision.  Having feelings of numbness or tingling.  Having trouble talking.  Having muscle weakness. DIAGNOSIS  A migraine headache is often diagnosed based on:  Symptoms.  Physical exam.  A CT scan or MRI of your head. These imaging tests cannot diagnose migraines, but they can help rule out other causes of headaches. TREATMENT Medicines may be given for pain and nausea. Medicines can also be given to help prevent recurrent migraines.  HOME CARE INSTRUCTIONS  Only take over-the-counter or prescription medicines for pain or discomfort as directed by your  health care provider. The use of long-term narcotics is not recommended.  Lie down in a dark, quiet room when you have a migraine.  Keep a journal to find out what may trigger your migraine headaches. For example, write down:  What you eat and drink.  How much sleep you get.  Any change to your diet or medicines.  Limit alcohol consumption.  Quit smoking if you smoke.  Get 7 9 hours of sleep, or as recommended by your health care provider.  Limit stress.  Keep lights dim if bright lights bother you and make your migraines worse. SEEK IMMEDIATE MEDICAL CARE IF:   Your migraine becomes severe.  You have a fever.  You have a stiff neck.  You have vision loss.  You have muscular weakness or loss of muscle control.  You start losing your balance or have trouble walking.  You feel faint or pass out.  You have severe symptoms that are different from your first symptoms. MAKE SURE YOU:   Understand these instructions.  Will watch your condition.  Will get help right away if you are not doing well or get worse. Document Released: 04/19/2005 Document Revised: 02/07/2013 Document Reviewed: 12/25/2012 [employer] Patient Information 2014 [employer].  

## 2013-08-07 NOTE — ED Notes (Signed)
Pt c/o sore throat onset x2 days Sxs also include swelling of tongue, poss thrush and swelling of lymph nodes Denies f/v/n/d Seen here on 04/1 for asthma/bronchitis Alert w/no signs of acute distress.

## 2013-08-07 NOTE — ED Notes (Signed)
Pt here from home with c/o chest tightness, n/v. Pt seen earlier at an urgent care for thrush, resp infection and migraine

## 2013-08-07 NOTE — ED Provider Notes (Signed)
CSN: [id number]     Arrival date & time 08/07/13  1748 History   First MD Initiated Contact with Patient 08/07/13 1755     Chief Complaint  Patient presents with  . Chest Pain  . Nausea  . Emesis     (Consider location/radiation/quality/duration/timing/severity/associated sxs/prior Treatment) Patient is a 64 y.o. female presenting with chest pain and vomiting. The history is provided by the patient.  Chest Pain Pain location:  Substernal area Pain quality: tightness   Pain radiates to:  Does not radiate Pain radiates to the back: no   Pain severity:  Mild Onset quality:  Gradual Timing:  Constant Progression:  Worsening Chronicity:  New Context: not breathing and not at rest   Relieved by: albuterol. Worsened by:  Nothing tried Associated symptoms: fatigue (2.5 weeks)   Associated symptoms: no abdominal pain, no cough, no fever, no shortness of breath and not vomiting   Emesis Associated symptoms: no abdominal pain and no chills     Past Medical History  Diagnosis Date  . Arthritis     osteo  . GERD (gastroesophageal reflux disease)   . Hyperlipidemia   . Hypertension   . Allergy   . Chronic gastritis   . Hemorrhoids   . Sleep disturbance   . Adenomatous colon polyp 10/1997, and 03/2009  . Menorrhagia     partial hysterectomy  . Migraines   . 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 Vaginal hysterectomy  1998    secondary to prolapse, ovaries remain  . Tonsillectomy  age 12  . Turbinate sinus surgery  2003   Family History  Problem Relation Age of Onset  . Throat cancer Father   . Coronary artery disease Father   . Heart attack Father 30  . Stroke Father     during procedure  . Lung cancer Father 54  . Coronary artery disease Mother   . Heart failure Mother   . Hypertension Mother   . Hyperlipidemia Mother   . Diabetes Paternal Aunt     x 2  . Diabetes  Paternal Grandmother   . Coronary artery disease Paternal Grandmother   . Stroke Maternal Grandmother     or MI  . Prostate cancer Paternal Uncle   . Cancer Brother   . Heart disease Brother    History  Substance Use Topics  . Smoking status: Never Smoker   . Smokeless tobacco: Never Used  . Alcohol Use: No   OB History   Grav Para Term Preterm Abortions TAB SAB Ect Mult Living   2 2 2       2      Review of Systems  Constitutional: Positive for fatigue (2.5 weeks). Negative for fever and chills.  Respiratory: Negative for cough and shortness of breath.   Cardiovascular: Positive for chest pain (heaviness, tightness).  Gastrointestinal: Negative for vomiting and abdominal pain.  All other systems reviewed and are negative.      Allergies  Trazodone hcl  Home Medications   Current Outpatient Rx  Name  Route  Sig  Dispense  Refill  . albuterol (PROVENTIL HFA;VENTOLIN HFA) 108 (90 BASE) MCG/ACT inhaler   Inhalation   Inhale 2 puffs into the lungs every 6 (six) hours as needed for wheezing or shortness of breath.   1 Inhaler   2   . busPIRone (BUSPAR) 10 MG tablet  Take 1 tablet by mouth 1-3 times daily         . candesartan (ATACAND) 16 MG tablet   Oral   Take 16 mg by mouth daily.           . cetirizine (ZYRTEC) 10 MG tablet   Oral   Take 1 tablet (10 mg total) by mouth daily. One tab daily for allergies   30 tablet   1   . chlorpheniramine-HYDROcodone (TUSSIONEX PENNKINETIC ER) 10-8 MG/5ML LQCR   Oral   Take 5 mLs by mouth every 12 (twelve) hours as needed for cough.   115 mL   0   . ciclesonide (OMNARIS) 50 MCG/ACT nasal spray   Each Nare   Place 2 sprays into both nostrils daily.         . Estradiol 10 MCG TABS vaginal tablet   Vaginal   Place 1 tablet (10 mcg total) vaginally 2 (two) times a week.   24 tablet   3   . fluconazole (DIFLUCAN) 100 MG tablet   Oral   Take 1 tablet (100 mg total) by mouth daily.   14 tablet   1   .  fluticasone (FLONASE) 50 MCG/ACT nasal spray   Each Nare   Place 2 sprays into both nostrils daily.   16 g   2   . fluticasone (FLONASE) 50 MCG/ACT nasal spray   Each Nare   Place 1 spray into both nostrils 2 (two) times daily.   1 g   2   . ibuprofen (ADVIL,MOTRIN) 600 MG tablet   Oral   Take 1 tablet (600 mg total) by mouth every 8 (eight) hours as needed for pain or fever.   20 tablet   0   . loratadine (CLARITIN) 10 MG tablet   Oral   Take 10 mg by mouth daily.           . metaxalone (SKELAXIN) 800 MG tablet   Oral   Take 800 mg by mouth 3 (three) times daily as needed.         . montelukast (SINGULAIR) 10 MG tablet   Oral   Take 10 mg by mouth at bedtime.           . nortriptyline (PAMELOR) 10 MG capsule   Oral   Take 10 mg by mouth at bedtime.         Marland Kitchen omeprazole (PRILOSEC) 40 MG capsule   Oral   Take 1 capsule (40 mg total) by mouth 2 (two) times daily.   180 capsule   3   . polyethylene glycol powder (GLYCOLAX/MIRALAX) powder   Oral   Take 17 g by mouth daily.   850 g   1   . pravastatin (PRAVACHOL) 20 MG tablet   Oral   Take 20 mg by mouth daily.           . predniSONE (DELTASONE) 50 MG tablet   Oral   Take 1 tablet (50 mg total) by mouth daily.   5 tablet   0   . EXPIRED: ranitidine (ZANTAC) 150 MG tablet   Oral   Take 1 tablet (150 mg total) by mouth at bedtime.   90 tablet   3   . valACYclovir (VALTREX) 500 MG tablet   Oral   Take 1 tablet (500 mg total) by mouth 2 (two) times daily. Take as directed.   30 tablet   6   . zolmitriptan (ZOMIG ZMT) 2.5 MG disintegrating  tablet   Oral   Take 1 tablet (2.5 mg total) by mouth as needed for migraine.   10 tablet   1   . zolmitriptan (ZOMIG-ZMT) 2.5 MG disintegrating tablet   Oral   Take 2.5 mg by mouth. One by mouth at the onset of headache          . zolpidem (AMBIEN) 10 MG tablet      1/2-1 tab at bedtime as needed to help sleep   30 tablet   0    BP 135/76   Temp(Src) 98.3 F (36.8 C) (Oral)  Resp 15  SpO2 100% Physical Exam  Nursing note and vitals reviewed. Constitutional: She is oriented to person, place, and time. She appears well-developed and well-nourished. No distress.  HENT:  Head: Normocephalic and atraumatic.  Right Ear: Tympanic membrane normal.  Left Ear: Tympanic membrane normal.  Mouth/Throat: Oropharyngeal exudate (thrush) and posterior oropharyngeal erythema present.  Eyes: EOM are normal. Pupils are equal, round, and reactive to light.  Neck: Normal range of motion. Neck supple.  Cardiovascular: Normal rate and regular rhythm.  Exam reveals no friction rub.   No murmur heard. Pulmonary/Chest: Effort normal and breath sounds normal. No respiratory distress. She has no wheezes. She has no rales.  Abdominal: Soft. She exhibits no distension. There is no tenderness. There is no rebound.  Musculoskeletal: Normal range of motion. She exhibits no edema.  Neurological: She is alert and oriented to person, place, and time. No cranial nerve deficit. She exhibits normal muscle tone. Coordination normal.  Skin: No rash noted. She is not diaphoretic.    ED Course  Procedures (including critical care time) Labs Review Labs Reviewed  Randolm Idol, ED   Imaging Review Dg Chest 2 View  08/07/2013   CLINICAL DATA:  Cough, history of asthma and bronchitis  EXAM: CHEST  2 VIEW  COMPARISON:  01/13/2011.  FINDINGS: Cardiomediastinal silhouette is stable. Mild thoracic dextroscoliosis again noted. No acute infiltrate or pleural effusion. No pulmonary edema.  IMPRESSION: No active cardiopulmonary disease.  No significant change.   Electronically Signed   By: Lahoma Crocker M.D.   On: 08/07/2013 12:59     EKG Interpretation   Date/Time:  Tuesday August 07 2013 17:57:34 EDT Ventricular Rate:  80 PR Interval:  141 QRS Duration: 77 QT Interval:  397 QTC Calculation: 458 R Axis:   62 Text Interpretation:  Sinus rhythm Simlar to prior  Confirmed by Mingo Amber   MD, [place] ([id number]) on 08/07/2013 5:59:51 PM      MDM   Final diagnoses:  Headache  Chest tightness      57 -year-old female presents with chest tightness. Was seen at urgent care earlier for a migraine and sickness for 2 weeks involving upper respiratory congestion, throat pain. Was given IM meds for headache, Benadryl and likely Reglan but unsure, and then went home. At home began coughing, having worsening of her chest tightness and heaviness. She states 2 weeks of chest heaviness with this associated sickness. Relief with albuterol and duo nebs. No exacerbating factors. No radiation. No history of heart disease. EKG normal, nonischemic. Exam is benign. History sounds like her chest pain is secondary to her URI symptoms. We'll give duo-neb. Recently finished a five-day course of 50 mg of prednisone daily. Will give migraine cocktail for her headache, given Decadron, Compazine, Benadryl. History of migraines and states this feels just like her prior migraines. No fevers, neck supple, no concern for meningitis.  On re-exam, feeling better  after IV headache cocktail and duo-nebs. Has albuterol nebulizer at home, cough medicine at home, abortive therapy for migraines at home. Stable for discharge.  Osvaldo Shipper, MD 08/08/13 [phone]

## 2013-08-07 NOTE — Discharge Instructions (Signed)
Use medicine as prescribed, you must rinse mouth after symbicort use, eat yogurt daily, see your doctor if further problems.

## 2013-08-07 NOTE — ED Provider Notes (Signed)
CSN: [id number]     Arrival date & time 08/07/13  1108 History   First MD Initiated Contact with Patient 08/07/13 1237     Chief Complaint  Patient presents with  . Sore Throat   (Consider location/radiation/quality/duration/timing/severity/associated sxs/prior Treatment) Patient is a 63 y.o. female presenting with pharyngitis. The history is provided by the patient and the spouse.  Sore Throat This is a new problem. The current episode started more than 2 days ago. The problem has not changed since onset.Associated symptoms include headaches.    Past Medical History  Diagnosis Date  . Arthritis     osteo  . GERD (gastroesophageal reflux disease)   . Hyperlipidemia   . Hypertension   . Allergy   . Chronic gastritis   . Hemorrhoids   . Sleep disturbance   . Adenomatous colon polyp 10/1997, and 03/2009  . Menorrhagia     partial hysterectomy  . Migraines   . 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 Vaginal hysterectomy  1998    secondary to prolapse, ovaries remain  . Tonsillectomy  age 2  . Turbinate sinus surgery  2003   Family History  Problem Relation Age of Onset  . Throat cancer Father   . Coronary artery disease Father   . Heart attack Father 53  . Stroke Father     during procedure  . Lung cancer Father 26  . Coronary artery disease Mother   . Heart failure Mother   . Hypertension Mother   . Hyperlipidemia Mother   . Diabetes Paternal Aunt     x 2  . Diabetes Paternal Grandmother   . Coronary artery disease Paternal Grandmother   . Stroke Maternal Grandmother     or MI  . Prostate cancer Paternal Uncle   . Cancer Brother   . Heart disease Brother    History  Substance Use Topics  . Smoking status: Never Smoker   . Smokeless tobacco: Never Used  . Alcohol Use: No   OB History   Grav Para Term Preterm Abortions TAB SAB Ect Mult Living   2 2 2       2       Review of Systems  Constitutional: Negative for fever.  HENT: Positive for congestion, rhinorrhea and sinus pressure.   Respiratory: Negative.   Cardiovascular: Negative.   Neurological: Positive for headaches.    Allergies  Trazodone hcl  Home Medications   Current Outpatient Rx  Name  Route  Sig  Dispense  Refill  . albuterol (PROVENTIL HFA;VENTOLIN HFA) 108 (90 BASE) MCG/ACT inhaler   Inhalation   Inhale 2 puffs into the lungs every 6 (six) hours as needed for wheezing or shortness of breath.   1 Inhaler   2   . candesartan (ATACAND) 16 MG tablet   Oral   Take 16 mg by mouth daily.           . chlorpheniramine-HYDROcodone (TUSSIONEX PENNKINETIC ER) 10-8 MG/5ML LQCR   Oral   Take 5 mLs by mouth every 12 (twelve) hours as needed for cough.   115 mL   0   . fluticasone (FLONASE) 50 MCG/ACT nasal spray   Each Nare   Place 2 sprays into both nostrils daily.   16 g   2   . loratadine (CLARITIN) 10 MG tablet   Oral   Take 10 mg by mouth daily.           Marland Kitchen  metaxalone (SKELAXIN) 800 MG tablet   Oral   Take 800 mg by mouth 3 (three) times daily as needed.         . montelukast (SINGULAIR) 10 MG tablet   Oral   Take 10 mg by mouth at bedtime.           . pravastatin (PRAVACHOL) 20 MG tablet   Oral   Take 20 mg by mouth daily.           Marland Kitchen zolmitriptan (ZOMIG-ZMT) 2.5 MG disintegrating tablet   Oral   Take 2.5 mg by mouth. One by mouth at the onset of headache          . busPIRone (BUSPAR) 10 MG tablet      Take 1 tablet by mouth 1-3 times daily         . cetirizine (ZYRTEC) 10 MG tablet   Oral   Take 1 tablet (10 mg total) by mouth daily. One tab daily for allergies   30 tablet   1   . ciclesonide (OMNARIS) 50 MCG/ACT nasal spray   Each Nare   Place 2 sprays into both nostrils daily.         . Estradiol 10 MCG TABS vaginal tablet   Vaginal   Place 1 tablet (10 mcg total) vaginally 2 (two) times a week.   24 tablet   3   .  fluconazole (DIFLUCAN) 100 MG tablet   Oral   Take 1 tablet (100 mg total) by mouth daily.   14 tablet   1   . fluticasone (FLONASE) 50 MCG/ACT nasal spray   Each Nare   Place 1 spray into both nostrils 2 (two) times daily.   1 g   2   . ibuprofen (ADVIL,MOTRIN) 600 MG tablet   Oral   Take 1 tablet (600 mg total) by mouth every 8 (eight) hours as needed for pain or fever.   20 tablet   0   . nortriptyline (PAMELOR) 10 MG capsule   Oral   Take 10 mg by mouth at bedtime.         Marland Kitchen omeprazole (PRILOSEC) 40 MG capsule   Oral   Take 1 capsule (40 mg total) by mouth 2 (two) times daily.   180 capsule   3   . polyethylene glycol powder (GLYCOLAX/MIRALAX) powder   Oral   Take 17 g by mouth daily.   850 g   1   . predniSONE (DELTASONE) 50 MG tablet   Oral   Take 1 tablet (50 mg total) by mouth daily.   5 tablet   0   . EXPIRED: ranitidine (ZANTAC) 150 MG tablet   Oral   Take 1 tablet (150 mg total) by mouth at bedtime.   90 tablet   3   . valACYclovir (VALTREX) 500 MG tablet   Oral   Take 1 tablet (500 mg total) by mouth 2 (two) times daily. Take as directed.   30 tablet   6   . zolmitriptan (ZOMIG ZMT) 2.5 MG disintegrating tablet   Oral   Take 1 tablet (2.5 mg total) by mouth as needed for migraine.   10 tablet   1   . zolpidem (AMBIEN) 10 MG tablet      1/2-1 tab at bedtime as needed to help sleep   30 tablet   0    BP 123/72  Pulse 94  Temp(Src) 98.2 F (36.8 C) (Oral)  Resp 18  SpO2  99% Physical Exam  Nursing note and vitals reviewed. Constitutional: She is oriented to person, place, and time. She appears well-developed and well-nourished.  HENT:  Head: Normocephalic.  Right Ear: External ear normal.  Left Ear: External ear normal.  Mouth/Throat: Oropharyngeal exudate and posterior oropharyngeal erythema present.  Neck: Normal range of motion. Neck supple.  Cardiovascular: Normal rate, normal heart sounds and intact distal pulses.    Pulmonary/Chest: Breath sounds normal.  Lymphadenopathy:    She has no cervical adenopathy.  Neurological: She is alert and oriented to person, place, and time.  Skin: Skin is warm.    ED Course  Procedures (including critical care time) Labs Review Labs Reviewed  POCT RAPID STREP A ([place])   Imaging Review Dg Chest 2 View  08/07/2013   CLINICAL DATA:  Cough, history of asthma and bronchitis  EXAM: CHEST  2 VIEW  COMPARISON:  01/13/2011.  FINDINGS: Cardiomediastinal silhouette is stable. Mild thoracic dextroscoliosis again noted. No acute infiltrate or pleural effusion. No pulmonary edema.  IMPRESSION: No active cardiopulmonary disease.  No significant change.   Electronically Signed   By: Lahoma Crocker M.D.   On: 08/07/2013 12:59     MDM   1. Thrush of mouth and esophagus   2. Seasonal allergic rhinitis   3. Migraine headache        [name], MD 08/07/13 [phone]

## 2013-08-09 LAB — CULTURE, GROUP A STREP

## 2013-08-21 DIAGNOSIS — J309 Allergic rhinitis, unspecified: Secondary | ICD-10-CM | POA: Diagnosis not present

## 2013-08-21 DIAGNOSIS — J45909 Unspecified asthma, uncomplicated: Secondary | ICD-10-CM | POA: Diagnosis not present

## 2013-09-04 DIAGNOSIS — J309 Allergic rhinitis, unspecified: Secondary | ICD-10-CM | POA: Diagnosis not present

## 2013-09-04 DIAGNOSIS — J45909 Unspecified asthma, uncomplicated: Secondary | ICD-10-CM | POA: Diagnosis not present

## 2013-09-25 DIAGNOSIS — IMO0001 Reserved for inherently not codable concepts without codable children: Secondary | ICD-10-CM | POA: Diagnosis not present

## 2013-09-25 DIAGNOSIS — M19049 Primary osteoarthritis, unspecified hand: Secondary | ICD-10-CM | POA: Diagnosis not present

## 2013-09-25 DIAGNOSIS — M542 Cervicalgia: Secondary | ICD-10-CM | POA: Diagnosis not present

## 2013-09-25 DIAGNOSIS — M1710 Unilateral primary osteoarthritis, unspecified knee: Secondary | ICD-10-CM | POA: Diagnosis not present

## 2013-10-10 DIAGNOSIS — H2510 Age-related nuclear cataract, unspecified eye: Secondary | ICD-10-CM | POA: Diagnosis not present

## 2013-10-10 DIAGNOSIS — H40019 Open angle with borderline findings, low risk, unspecified eye: Secondary | ICD-10-CM | POA: Diagnosis not present

## 2013-10-10 DIAGNOSIS — H04129 Dry eye syndrome of unspecified lacrimal gland: Secondary | ICD-10-CM | POA: Diagnosis not present

## 2013-10-10 DIAGNOSIS — H1045 Other chronic allergic conjunctivitis: Secondary | ICD-10-CM | POA: Diagnosis not present

## 2013-10-29 DIAGNOSIS — K5900 Constipation, unspecified: Secondary | ICD-10-CM | POA: Diagnosis not present

## 2013-10-29 DIAGNOSIS — IMO0001 Reserved for inherently not codable concepts without codable children: Secondary | ICD-10-CM | POA: Diagnosis not present

## 2013-10-29 DIAGNOSIS — J45909 Unspecified asthma, uncomplicated: Secondary | ICD-10-CM | POA: Diagnosis not present

## 2013-10-29 DIAGNOSIS — I10 Essential (primary) hypertension: Secondary | ICD-10-CM | POA: Diagnosis not present

## 2013-10-29 DIAGNOSIS — G4700 Insomnia, unspecified: Secondary | ICD-10-CM | POA: Diagnosis not present

## 2013-10-29 DIAGNOSIS — G4769 Other sleep related movement disorders: Secondary | ICD-10-CM | POA: Diagnosis not present

## 2013-10-29 DIAGNOSIS — B370 Candidal stomatitis: Secondary | ICD-10-CM | POA: Diagnosis not present

## 2013-10-29 DIAGNOSIS — R42 Dizziness and giddiness: Secondary | ICD-10-CM | POA: Diagnosis not present

## 2013-11-06 ENCOUNTER — Ambulatory Visit (INDEPENDENT_AMBULATORY_CARE_PROVIDER_SITE_OTHER): Payer: Medicare Other | Admitting: Neurology

## 2013-11-06 ENCOUNTER — Encounter: Payer: Self-pay | Admitting: Neurology

## 2013-11-06 VITALS — BP 118/68 | HR 70 | Temp 98.4°F | Resp 18 | Ht 64.0 in | Wt 171.6 lb

## 2013-11-06 DIAGNOSIS — G43909 Migraine, unspecified, not intractable, without status migrainosus: Secondary | ICD-10-CM | POA: Diagnosis not present

## 2013-11-06 DIAGNOSIS — G4761 Periodic limb movement disorder: Secondary | ICD-10-CM

## 2013-11-06 DIAGNOSIS — G43111 Migraine with aura, intractable, with status migrainosus: Secondary | ICD-10-CM

## 2013-11-06 DIAGNOSIS — R42 Dizziness and giddiness: Secondary | ICD-10-CM

## 2013-11-06 DIAGNOSIS — G43101 Migraine with aura, not intractable, with status migrainosus: Secondary | ICD-10-CM

## 2013-11-06 DIAGNOSIS — IMO0001 Reserved for inherently not codable concepts without codable children: Secondary | ICD-10-CM

## 2013-11-06 DIAGNOSIS — R569 Unspecified convulsions: Secondary | ICD-10-CM | POA: Diagnosis not present

## 2013-11-06 DIAGNOSIS — R6889 Other general symptoms and signs: Secondary | ICD-10-CM

## 2013-11-06 LAB — SEDIMENTATION RATE: Sed Rate: 15 mm/hr (ref 0–22)

## 2013-11-06 LAB — VITAMIN B12: Vitamin B-12: 241 pg/mL (ref 211–911)

## 2013-11-06 LAB — CREATININE, SERUM: CREATININE: 0.81 mg/dL (ref 0.50–1.10)

## 2013-11-06 LAB — TSH: TSH: 1.922 u[IU]/mL (ref 0.350–4.500)

## 2013-11-06 LAB — FERRITIN: FERRITIN: 37 ng/mL (ref 10–291)

## 2013-11-06 NOTE — Progress Notes (Signed)
Note faxed to Dr. Kelton Pillar.

## 2013-11-06 NOTE — Patient Instructions (Addendum)
1.  MRI brain wwo contrast 2.  MRA head and neck 3.  Stop topamax 4.  Check blood work 5.  Routine EEG 6.  Return to clinic 80-months

## 2013-11-06 NOTE — Progress Notes (Signed)
[facility] Clinic Note - Initial Visit   Date: 11/06/2013  Christina Deleon MRN: [id number] DOB: 1950-05-25   Dear Dr Amedeo Kinsman:  Thank you for your kind referral of Christina Deleon for consultation of leg jerking. Although her history is well known to you, please allow [place] to reiterate it for the purpose of our medical record. The patient was accompanied to the clinic by husband who also provides collateral information.     History of Present Illness: Christina Deleon is a 63 y.o. right-handed African American female with history of GERD, hypertension, hyperlipidemia, fibromyalgia, and migraines presenting for evaluation of leg jerking.    Starting around 2005, her husband started noticing right leg movements especially at night.  She lays still about 20-30 seconds followed by episodic jerking of the legs, especially the right side.  This occurs only at nighttime for the first few hours of sleep.  She takes [place] and about 40 minutes later, the movements start.  She does not feel refreshed in the morning.  She had a sleep study and was told that she did not need a "machine" and she did have "movement of the legs".  She endorses vivid dreams.  No history of sleep walking.  She also complaints of "itchy sensation" of the lower leg when she first tries to rest.  She has no taken any medications.  She has a history of migraines starting in her teens.  Pain is usually on the right side, described as throbbing/sharp pain.  She has associated nausea, photosensitivity, and phonophobia.  Duration is 4 hr, if she takes zomig, but they typically will occur for the next 3-4 days.  She takes zomig which helps.  She was started on topamax, but does not take it daily because of side effects.  She complains of one-month history of dizziness, described as lightheadedness.  She feels nauseous when she moves her head too quickly.  She denies any double vision.  It is worse when she bends down to  pick up something off the floor.  No ear pain or tinnitus.  She had a fall last year and developed a bump on the side of her head which resolved over a few weeks.   Out-side paper records, electronic medical record, and images have been reviewed where available and summarized as:  MRI cervical spine wo contrast 05/14/2008:  Cervical disc degeneration and spondylosis as above. No change from 2005.  MRI brain wo contrast 03/17/2004: Unremarkable MRI of the brain. No acute findings. benign arachnoid granulations (giant pacchionian granulations), and I feel that this finding represents a normal anatomic variant as opposed to pathology. At this point, I cannot make the diagnosis of a pathologic lesion such as an eosinophilic granuloma. Again I favor that this represents a benign process, i.e. giant arachnoid granulations.     Past Medical History  Diagnosis Date  . Arthritis     osteo  . GERD (gastroesophageal reflux disease)   . Hyperlipidemia   . Hypertension   . Allergy   . Chronic gastritis   . Hemorrhoids   . Sleep disturbance   . Adenomatous colon polyp 10/1997, and 03/2009  . Menorrhagia     partial hysterectomy  . Migraines   . STD (sexually transmitted disease) 01/22/11 culture proven    HSV type I labia    Past Surgical History  Procedure Laterality Date  . Vaginal hysterectomy  1998    secondary to prolapse, ovaries remain  . Tonsillectomy  age 65  . Turbinate sinus surgery  2003     Medications:  Current Outpatient Prescriptions on File Prior to Visit  Medication Sig Dispense Refill  . albuterol (PROVENTIL HFA;VENTOLIN HFA) 108 (90 BASE) MCG/ACT inhaler Inhale 2 puffs into the lungs every 6 (six) hours as needed for wheezing or shortness of breath.  1 Inhaler  2  . candesartan (ATACAND) 16 MG tablet Take 16 mg by mouth at bedtime.       . cetirizine (ZYRTEC) 10 MG tablet Take 1 tablet (10 mg total) by mouth daily. One tab daily for allergies  30 tablet  1  .  fluticasone (FLONASE) 50 MCG/ACT nasal spray Place 1 spray into both nostrils daily.      . metaxalone (SKELAXIN) 800 MG tablet Take 800 mg by mouth 3 (three) times daily as needed for muscle spasms.       . montelukast (SINGULAIR) 10 MG tablet Take 10 mg by mouth at bedtime.        . polyethylene glycol powder (GLYCOLAX/MIRALAX) powder Take 17 g by mouth daily.  850 g  1  . pravastatin (PRAVACHOL) 20 MG tablet Take 20 mg by mouth daily.        Marland Kitchen zolmitriptan (ZOMIG ZMT) 2.5 MG disintegrating tablet Take 1 tablet (2.5 mg total) by mouth as needed for migraine.  10 tablet  1  . zolpidem (AMBIEN) 10 MG tablet Take 10 mg by mouth at bedtime.      . chlorpheniramine-HYDROcodone (TUSSIONEX PENNKINETIC ER) 10-8 MG/5ML LQCR Take 5 mLs by mouth every 12 (twelve) hours as needed for cough.  115 mL  0  . fluconazole (DIFLUCAN) 100 MG tablet Take 1 tablet (100 mg total) by mouth daily.  14 tablet  1  . loratadine (CLARITIN) 10 MG tablet Take 10 mg by mouth at bedtime.       . predniSONE (DELTASONE) 50 MG tablet Take 1 tablet (50 mg total) by mouth daily.  5 tablet  0  . SYMBICORT 160-4.5 MCG/ACT inhaler Inhale 2 puffs into the lungs 2 (two) times daily.      . [DISCONTINUED] dexlansoprazole (DEXILANT) 60 MG capsule Take 60 mg by mouth daily.         No current facility-administered medications on file prior to visit.    Allergies:  Allergies  Allergen Reactions  . Trazodone Hcl Other (See Comments)    REACTION: bad headaches  . Penicillins     Nausea     Family History: Family History  Problem Relation Age of Onset  . Throat cancer Father   . Coronary artery disease Father   . Heart attack Father 65  . Stroke Father     during procedure  . Lung cancer Father 3  . Coronary artery disease Mother   . Heart failure Mother   . Hypertension Mother   . Hyperlipidemia Mother   . Diabetes Paternal Aunt     x 2  . Diabetes Paternal Grandmother   . Coronary artery disease Paternal Grandmother     . Stroke Maternal Grandmother     or MI  . Prostate cancer Paternal Uncle   . Cancer Brother   . Heart disease Brother     Social History: History   Social History  . Marital Status: Married    Spouse Name: N/A    Number of Children: 2  . Years of Education: N/A   Occupational History  . unemployed      regulatory compliance specialist  unemployed sincesince 6/10   Social History Main Topics  . Smoking status: Never Smoker   . Smokeless tobacco: Never Used  . Alcohol Use: No  . Drug Use: No  . Sexual Activity: Yes    Partners: Male    Birth Control/ Protection: Surgical     Comment: hysterectomy   Other Topics Concern  . Not on file   Social History Narrative   She lives with husband.  They have 2 grown children.   She is retired Product/process development scientist.   Highest level of education:  2 years of college      Regular exercise, diet of fruits, veggies, limited fried foods, limited water    Review of Systems:  CONSTITUTIONAL: No fevers, chills, night sweats, or weight loss.   EYES: No visual changes or eye pain ENT: No hearing changes.  No history of nose bleeds.   RESPIRATORY: No cough, wheezing and shortness of breath.   CARDIOVASCULAR: Negative for chest pain, and palpitations.   GI: Negative for abdominal discomfort, blood in stools or black stools.  No recent change in bowel habits.   GU:  No history of incontinence.   MUSCLOSKELETAL: No history of joint pain or swelling.  No myalgias.   SKIN: Negative for lesions, rash, and itching.   HEMATOLOGY/ONCOLOGY: Negative for prolonged bleeding, bruising easily, and swollen nodes.    ENDOCRINE: Negative for cold or heat intolerance, polydipsia or goiter.   PSYCH:  +depression or anxiety symptoms.   NEURO: As Above.   Vital Signs:  BP 118/68  Pulse 70  Temp(Src) 98.4 F (36.9 C)  Resp 18  Ht _0  (1.626 m)  Wt 171 lb 9.6 oz (77.837 kg)  BMI 29.44 kg/m2    General Medical Exam:   General:  Well appearing,  comfortable.   Eyes/ENT: see cranial nerve examination.   Neck: No masses appreciated.  Full range of motion without tenderness.  No carotid bruits. Respiratory:  Clear to auscultation, good air entry bilaterally.   Cardiac:  Regular rate and rhythm, no murmur.   Extremities:  No deformities, edema, or skin discoloration. Good capillary refill.   Skin:  Skin color, texture, turgor normal. No rashes or lesions.  Neurological Exam: MENTAL STATUS including orientation to time, place, person, recent and remote memory, attention span and concentration, language, and fund of knowledge is normal.  Speech is not dysarthric.  CRANIAL NERVES: II:  No visual field defects.  Unremarkable fundi.   III-IV-VI: Pupils equal round and reactive to light.  Normal conjugate, extra-ocular eye movements in all directions of gaze.  No nystagmus.  No ptosis.   V:  Normal facial sensation.   VII:  Normal facial symmetry and movements.  No pathologic facial reflexes.  VIII:  Normal hearing and vestibular function.   IX-X:  Normal palatal movement.   XI:  Normal shoulder shrug and head rotation.   XII:  Normal tongue strength and range of motion, no deviation or fasciculation.  MOTOR:  No atrophy, fasciculations or abnormal movements.  No pronator drift.  Tone is normal.    Right Upper Extremity:    Left Upper Extremity:    Deltoid  5/5   Deltoid  5/5   Biceps  5/5   Biceps  5/5   Triceps  5/5   Triceps  5/5   Wrist extensors  5/5   Wrist extensors  5/5   Wrist flexors  5/5   Wrist flexors  5/5   Finger extensors  5/5  Finger extensors  5/5   Finger flexors  5/5   Finger flexors  5/5   Dorsal interossei  5/5   Dorsal interossei  5/5   Abductor pollicis  5/5   Abductor pollicis  5/5   Tone (Ashworth scale)  0  Tone (Ashworth scale)  0   Right Lower Extremity:    Left Lower Extremity:    Hip flexors  5/5   Hip flexors  5/5   Hip extensors  5/5   Hip extensors  5/5   Knee flexors  5/5   Knee flexors  5/5     Knee extensors  5/5   Knee extensors  5/5   Dorsiflexors  5/5   Dorsiflexors  5/5   Plantarflexors  5/5   Plantarflexors  5/5   Toe extensors  5/5   Toe extensors  5/5   Toe flexors  5/5   Toe flexors  5/5   Tone (Ashworth scale)  0  Tone (Ashworth scale)  0   MSRs:  Right                                                                 Left brachioradialis 2+  brachioradialis 2+  biceps 2+  biceps 2+  triceps 2+  triceps 2+  patellar 2+  patellar 2+  ankle jerk 2+  ankle jerk 2+  Hoffman no  Hoffman no  plantar response down  plantar response down   SENSORY:  Normal and symmetric perception of light touch, pinprick, vibration, and proprioception.  Romberg's sign absent.   COORDINATION/GAIT: Normal finger-to- nose-finger and heel-to-shin.  Intact rapid alternating movements bilaterally.  Able to rise from a chair without using arms.  Gait narrow based and stable. She is able to perform stressed gait.  Unsteady with tandem gait.     IMPRESSION: 1.  Periodic limb movements of sleep  - Less likely RLS, but will check ferritin 2.  Episodic migraine  - Headaches days ~8/month 3.  Lightheadedness/dizziness  - No evidence of nystagmus to suggest peripheral etiology  - Neuro exam is non-focal  - Will obtain vessel imaging    PLAN/RECOMMENDATIONS:  1.  Check ESR, vitamin B12, TSH, ferritin 2.  MRI brain wwo contrast 3.  MRA head and neck 4.  Stop topamax as patient only taking sparingly and likely not providing any benefit at that dose.  If she develops headaches > 15 days/month, can restart 5.  Routine EEG for spells of dizziness 6.  Return to clinic in 21-month   The duration of this appointment visit was 50 minutes of face-to-face time with the patient.  Greater than 50% of this time was spent in counseling, explanation of diagnosis, planning of further management, and coordination of care.   Thank you for allowing me to participate in patient's care.  If I can answer any  additional questions, I would be pleased to do so.    Sincerely,    Donika K. PPosey Pronto DO

## 2013-11-08 ENCOUNTER — Telehealth: Payer: Self-pay | Admitting: Neurology

## 2013-11-08 NOTE — Telephone Encounter (Signed)
Pt returning your call [phone]

## 2013-11-13 DIAGNOSIS — Z1231 Encounter for screening mammogram for malignant neoplasm of breast: Secondary | ICD-10-CM | POA: Diagnosis not present

## 2013-11-14 ENCOUNTER — Ambulatory Visit (INDEPENDENT_AMBULATORY_CARE_PROVIDER_SITE_OTHER): Payer: Medicare Other | Admitting: Neurology

## 2013-11-14 DIAGNOSIS — IMO0001 Reserved for inherently not codable concepts without codable children: Secondary | ICD-10-CM

## 2013-11-14 DIAGNOSIS — R42 Dizziness and giddiness: Secondary | ICD-10-CM | POA: Diagnosis not present

## 2013-11-14 DIAGNOSIS — G4761 Periodic limb movement disorder: Secondary | ICD-10-CM

## 2013-11-14 DIAGNOSIS — G43101 Migraine with aura, not intractable, with status migrainosus: Secondary | ICD-10-CM

## 2013-11-14 DIAGNOSIS — R6889 Other general symptoms and signs: Secondary | ICD-10-CM

## 2013-11-15 NOTE — Procedures (Signed)
ELECTROENCEPHALOGRAM REPORT  Date of Study: 11/14/2013  Patient's Name: Christina Deleon MRN: [id number] Date of Birth: Mar 11, 1951  Referring Provider: Dr. Narda Amber  Clinical History: This is a 63 year old woman with episodes of leg jerking and dizziness.    Medications: Candesartan, Skelaxin, Prednisone  Technical Summary: A multichannel digital EEG recording measured by the international 10-20 system with electrodes applied with paste and impedances below 5000 ohms performed in our laboratory with EKG monitoring in an awake and asleep patient.  Hyperventilation and photic stimulation were performed.  The digital EEG was referentially recorded, reformatted, and digitally filtered in a variety of bipolar and referential montages for optimal display.  Spike detection software was employed.  Description: The patient is awake and asleep during the recording.  During maximal wakefulness, there is a symmetric, medium voltage 9 Hz posterior dominant rhythm that attenuates with eye opening.  The record is symmetric.  During drowsiness and sleep, there is an increase in theta slowing of the background, with shifting asymmetry over the bilateral temporal regions, at times sharply contoured without clear epileptogenic potential.  Occasional vertex waves were seen.  Hyperventilation and photic stimulation did not elicit any abnormalities.  There were no epileptiform discharges or electrographic seizures seen.    EKG lead was unremarkable.  Impression: This awake and asleep EEG is within normal limits.  Clinical Correlation: A normal EEG does not exclude a clinical diagnosis of epilepsy.  If further clinical questions remain, prolonged EEG may be helpful.  Clinical correlation is advised.   Ellouise Newer, M.D.

## 2013-11-16 DIAGNOSIS — J309 Allergic rhinitis, unspecified: Secondary | ICD-10-CM | POA: Diagnosis not present

## 2013-11-16 DIAGNOSIS — J45909 Unspecified asthma, uncomplicated: Secondary | ICD-10-CM | POA: Diagnosis not present

## 2013-11-20 DIAGNOSIS — I10 Essential (primary) hypertension: Secondary | ICD-10-CM | POA: Diagnosis not present

## 2013-11-20 DIAGNOSIS — E785 Hyperlipidemia, unspecified: Secondary | ICD-10-CM | POA: Diagnosis not present

## 2013-11-20 DIAGNOSIS — J45909 Unspecified asthma, uncomplicated: Secondary | ICD-10-CM | POA: Diagnosis not present

## 2013-11-20 DIAGNOSIS — G4769 Other sleep related movement disorders: Secondary | ICD-10-CM | POA: Diagnosis not present

## 2013-11-20 DIAGNOSIS — Z0000 Encounter for general adult medical examination without abnormal findings: Secondary | ICD-10-CM | POA: Diagnosis not present

## 2013-11-20 DIAGNOSIS — Z23 Encounter for immunization: Secondary | ICD-10-CM | POA: Diagnosis not present

## 2013-11-20 DIAGNOSIS — M899 Disorder of bone, unspecified: Secondary | ICD-10-CM | POA: Diagnosis not present

## 2013-11-20 DIAGNOSIS — Z1331 Encounter for screening for depression: Secondary | ICD-10-CM | POA: Diagnosis not present

## 2013-11-20 DIAGNOSIS — Z1211 Encounter for screening for malignant neoplasm of colon: Secondary | ICD-10-CM | POA: Diagnosis not present

## 2013-11-23 ENCOUNTER — Ambulatory Visit (HOSPITAL_COMMUNITY): Admission: RE | Admit: 2013-11-23 | Payer: 59 | Source: Ambulatory Visit

## 2013-12-03 ENCOUNTER — Ambulatory Visit (HOSPITAL_COMMUNITY)
Admission: RE | Admit: 2013-12-03 | Discharge: 2013-12-03 | Disposition: A | Discharge status: Home / Self Care | Payer: Medicare Other | Source: Ambulatory Visit | Attending: Neurology | Admitting: Neurology

## 2013-12-03 ENCOUNTER — Ambulatory Visit (HOSPITAL_COMMUNITY): Payer: Medicare Other

## 2013-12-03 DIAGNOSIS — G4761 Periodic limb movement disorder: Secondary | ICD-10-CM | POA: Diagnosis not present

## 2013-12-03 DIAGNOSIS — R569 Unspecified convulsions: Secondary | ICD-10-CM | POA: Insufficient documentation

## 2013-12-03 DIAGNOSIS — I6529 Occlusion and stenosis of unspecified carotid artery: Secondary | ICD-10-CM | POA: Diagnosis not present

## 2013-12-03 DIAGNOSIS — G43111 Migraine with aura, intractable, with status migrainosus: Secondary | ICD-10-CM | POA: Diagnosis not present

## 2013-12-03 DIAGNOSIS — G43101 Migraine with aura, not intractable, with status migrainosus: Secondary | ICD-10-CM

## 2013-12-03 DIAGNOSIS — R42 Dizziness and giddiness: Secondary | ICD-10-CM

## 2013-12-03 MED ORDER — GADOBENATE DIMEGLUMINE 529 MG/ML IV SOLN
15.0000 mL | Freq: Once | INTRAVENOUS | Status: AC | PRN
  Administered 2013-12-03: 15 mL via INTRAVENOUS

## 2013-12-10 ENCOUNTER — Encounter: Payer: Self-pay | Admitting: Gastroenterology

## 2013-12-24 DIAGNOSIS — M19049 Primary osteoarthritis, unspecified hand: Secondary | ICD-10-CM | POA: Diagnosis not present

## 2013-12-24 DIAGNOSIS — M542 Cervicalgia: Secondary | ICD-10-CM | POA: Diagnosis not present

## 2013-12-24 DIAGNOSIS — IMO0001 Reserved for inherently not codable concepts without codable children: Secondary | ICD-10-CM | POA: Diagnosis not present

## 2013-12-24 DIAGNOSIS — M1710 Unilateral primary osteoarthritis, unspecified knee: Secondary | ICD-10-CM | POA: Diagnosis not present

## 2013-12-26 ENCOUNTER — Other Ambulatory Visit: Payer: Self-pay | Admitting: Internal Medicine

## 2013-12-26 DIAGNOSIS — R222 Localized swelling, mass and lump, trunk: Secondary | ICD-10-CM | POA: Diagnosis not present

## 2013-12-26 DIAGNOSIS — R609 Edema, unspecified: Secondary | ICD-10-CM | POA: Diagnosis not present

## 2013-12-31 ENCOUNTER — Encounter (INDEPENDENT_AMBULATORY_CARE_PROVIDER_SITE_OTHER): Payer: Self-pay

## 2013-12-31 ENCOUNTER — Ambulatory Visit
Admission: RE | Admit: 2013-12-31 | Discharge: 2013-12-31 | Disposition: A | Discharge status: Home / Self Care | Payer: Medicare Other | Source: Ambulatory Visit | Attending: Internal Medicine | Admitting: Internal Medicine

## 2013-12-31 DIAGNOSIS — R222 Localized swelling, mass and lump, trunk: Secondary | ICD-10-CM

## 2013-12-31 DIAGNOSIS — M542 Cervicalgia: Secondary | ICD-10-CM | POA: Diagnosis not present

## 2014-01-08 ENCOUNTER — Encounter: Payer: Self-pay | Admitting: Gastroenterology

## 2014-01-14 ENCOUNTER — Encounter: Payer: Self-pay | Admitting: Nurse Practitioner

## 2014-01-14 ENCOUNTER — Ambulatory Visit (INDEPENDENT_AMBULATORY_CARE_PROVIDER_SITE_OTHER): Payer: Medicare Other | Admitting: Nurse Practitioner

## 2014-01-14 ENCOUNTER — Ambulatory Visit (INDEPENDENT_AMBULATORY_CARE_PROVIDER_SITE_OTHER): Payer: Medicare Other | Admitting: Neurology

## 2014-01-14 ENCOUNTER — Encounter: Payer: Self-pay | Admitting: Neurology

## 2014-01-14 VITALS — BP 132/84 | HR 92 | Ht 63.5 in | Wt 171.0 lb

## 2014-01-14 VITALS — BP 134/84 | HR 84 | Ht 63.0 in | Wt 171.5 lb

## 2014-01-14 DIAGNOSIS — M8580 Other specified disorders of bone density and structure, unspecified site: Secondary | ICD-10-CM

## 2014-01-14 DIAGNOSIS — E559 Vitamin D deficiency, unspecified: Secondary | ICD-10-CM | POA: Diagnosis not present

## 2014-01-14 DIAGNOSIS — M949 Disorder of cartilage, unspecified: Secondary | ICD-10-CM

## 2014-01-14 DIAGNOSIS — G43111 Migraine with aura, intractable, with status migrainosus: Secondary | ICD-10-CM | POA: Diagnosis not present

## 2014-01-14 DIAGNOSIS — Z01419 Encounter for gynecological examination (general) (routine) without abnormal findings: Secondary | ICD-10-CM

## 2014-01-14 DIAGNOSIS — G43101 Migraine with aura, not intractable, with status migrainosus: Secondary | ICD-10-CM

## 2014-01-14 DIAGNOSIS — M899 Disorder of bone, unspecified: Secondary | ICD-10-CM | POA: Diagnosis not present

## 2014-01-14 DIAGNOSIS — Z8709 Personal history of other diseases of the respiratory system: Secondary | ICD-10-CM | POA: Diagnosis not present

## 2014-01-14 DIAGNOSIS — G4761 Periodic limb movement disorder: Secondary | ICD-10-CM

## 2014-01-14 MED ORDER — ESTRADIOL 10 MCG VA TABS: 1.0000 | ORAL_TABLET | VAGINAL | Status: DC

## 2014-01-14 NOTE — Patient Instructions (Addendum)

## 2014-01-14 NOTE — Progress Notes (Signed)
Patient ID: Christina Deleon, female   DOB: [birth year], 63 y.o.   MRN: [id number] 62 y.o. [id number] Married Caucasian Fe here for annual exam.  She has used all of Vagifem samples from last year and now wants a refill.  She did note less vaginal dryness with use.  She had a bad flare of asthma this past spring and had to take steroids.  She is also off Vit D RX.  Patient's last menstrual period was 07/02/1994.          Sexually active: yes  The current method of family planning is status post hysterectomy.  Exercising: no The patient does not participate in regular exercise at present.  Smoker: no   Health Maintenance:  Pap: 11/23/01, WNL  MMG: 10/2013, normal per patient, [facility] Colonoscopy: 03/11/09, tubular adenoma, repeat 5 years  BMD: 04/10/12 T Score: spine 0.0, right hip neck -1.9; total -1.4  TDaP: 11/20/09  Labs:  PCP, would like Vit D check   reports that she has never smoked. She has never used smokeless tobacco. She reports that she does not drink alcohol or use illicit drugs.  Past Medical History  Diagnosis Date  . Arthritis     osteo  . GERD (gastroesophageal reflux disease)   . Hyperlipidemia   . Hypertension   . Allergy   . Chronic gastritis   . Hemorrhoids   . Sleep disturbance   . Adenomatous colon polyp 10/1997, and 03/2009  . Menorrhagia     partial hysterectomy  . Migraines   . 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 Asthma     Past Surgical History  Procedure Laterality Date  . Vaginal hysterectomy  1998    secondary to prolapse, ovaries remain  . Tonsillectomy  age 41  . Turbinate sinus surgery  2003    Current Outpatient Prescriptions  Medication Sig Dispense Refill  . albuterol (PROVENTIL HFA;VENTOLIN HFA) 108 (90 BASE) MCG/ACT inhaler Inhale 2 puffs into the lungs every 6 (six) hours as needed for wheezing or shortness of breath.  1 Inhaler  2  . candesartan (ATACAND) 16 MG tablet Take 16 mg by mouth at bedtime.        . cetirizine (ZYRTEC) 10 MG tablet Take 1 tablet (10 mg total) by mouth daily. One tab daily for allergies  30 tablet  1  . Estradiol 10 MCG TABS vaginal tablet Place 1 tablet (10 mcg total) vaginally 2 (two) times a week.  8 tablet  11  . fluticasone (FLONASE) 50 MCG/ACT nasal spray Place 1 spray into both nostrils daily.      . metaxalone (SKELAXIN) 800 MG tablet Take 800 mg by mouth 3 (three) times daily as needed for muscle spasms.       . mometasone (ASMANEX) 220 MCG/INH inhaler Inhale 2 puffs into the lungs daily.      . montelukast (SINGULAIR) 10 MG tablet Take 10 mg by mouth at bedtime.        Marland Kitchen omeprazole (PRILOSEC) 20 MG capsule Take 1 capsule by mouth every morning.      . polyethylene glycol powder (GLYCOLAX/MIRALAX) powder Take 17 g by mouth daily.  850 g  1  . pravastatin (PRAVACHOL) 20 MG tablet Take 20 mg by mouth daily.        . ranitidine (ZANTAC) 300 MG tablet Take 1 tablet by mouth at bedtime as needed.      Marland Kitchen tiZANidine (ZANAFLEX) 4 MG  tablet Take 1 tablet by mouth as needed.      . zolmitriptan (ZOMIG ZMT) 2.5 MG disintegrating tablet Take 1 tablet (2.5 mg total) by mouth as needed for migraine.  10 tablet  1  . zolpidem (AMBIEN) 10 MG tablet Take 10 mg by mouth at bedtime.      . [DISCONTINUED] dexlansoprazole (DEXILANT) 60 MG capsule Take 60 mg by mouth daily.         No current facility-administered medications for this visit.    Family History  Problem Relation Age of Onset  . Throat cancer Father   . Coronary artery disease Father   . Heart attack Father 31  . Stroke Father     during procedure  . Lung cancer Father 56  . Coronary artery disease Mother   . Heart failure Mother   . Hypertension Mother   . Hyperlipidemia Mother   . Diabetes Paternal Aunt     x 2  . Diabetes Paternal Grandmother   . Coronary artery disease Paternal Grandmother   . Stroke Maternal Grandmother     or MI  . Prostate cancer Paternal Uncle   . Cancer Brother   . Heart  disease Brother     ROS:  Pertinent items are noted in HPI.  Otherwise, a comprehensive ROS was negative.  Exam:   BP 132/84  Pulse 92  Ht 5' 3.5" (1.613 m)  Wt 171 lb (77.565 kg)  BMI 29.81 kg/m2  LMP 07/02/1994 Height: 5' 3.5" (161.3 cm)  Ht Readings from Last 3 Encounters:  01/14/14 5\' 3"  (1.6 m)  01/14/14 5' 3.5" (1.613 m)  11/06/13 5\' 4"  (1.626 m)    General appearance: alert, cooperative and appears stated age Head: Normocephalic, without obvious abnormality, atraumatic Neck: no adenopathy, supple, symmetrical, trachea midline and thyroid normal to inspection and palpation Lungs: clear to auscultation bilaterally Breasts: normal appearance, no masses or tenderness, FCB changes Heart: regular rate and rhythm Abdomen: soft, non-tender; no masses,  no organomegaly Extremities: extremities normal, atraumatic, no cyanosis or edema Skin: Skin color, texture, turgor normal. No rashes or lesions Lymph nodes: Cervical, supraclavicular, and axillary nodes normal. No abnormal inguinal nodes palpated Neurologic: Grossly normal   Pelvic: External genitalia:  no lesions              Urethra:  normal appearing urethra with no masses, tenderness or lesions              Bartholin's and Skene's: normal                 Vagina: normal appearing vagina with normal color and discharge, no lesions              Cervix: absent              Pap taken: No. Bimanual Exam:  Uterus:  uterus absent              Adnexa: no mass, fullness, tenderness               Rectovaginal: Confirms               Anus:  normal sphincter tone, no lesions  A:  Well Woman with normal exam  S/P TVH secondary to prolapse, ovaries remain 1996   Vit D deficiency   History of fibromyalgia, GERD, back pain   History of HSV - genital   Atrophic vaginitis  Flare of asthma this spring   P:   Reviewed health  and wellness pertinent to exam  Pap smear not taken today  Mammogram is due 10/2014, ROI for current  Mammo  Refill Vagifem 10 mcg twice weekly for a year.  She will compare price - may need Premarin vaginal cream.  Counseled with risk of DVT, CVA, cancer, etc  Counseled on breast self exam, mammography screening, use and side effects of HRT, adequate intake of calcium and vitamin D, diet and exercise, Kegel's exercises return annually or prn  An After Visit Summary was printed and given to the patient.

## 2014-01-14 NOTE — Progress Notes (Signed)
Follow-up Visit   Date: 01/14/2014    Christina Deleon MRN: [id number] DOB: [birth year]   Interim History: Christina Deleon is a 63 y.o. right-handed African American female with history of GERD, hypertension, hyperlipidemia, fibromyalgia, and migraines returning to the clinic for follow-up of periodic limb movement of sleep.  The patient was accompanied to the clinic by husband who also provides collateral information.    History of present illness: Starting around 2005, her husband started noticing right leg movements especially at night. She lays still about 20-30 seconds followed by episodic jerking of the legs, especially the right side. This occurs only at nighttime for the first few hours of sleep. She takes [place] and about 40 minutes later, the movements start. She does not feel refreshed in the morning. She had a sleep study and was told that she did not need a "machine" and she did have "movement of the legs". She endorses vivid dreams. No history of sleep walking. She also complaints of "itchy sensation" of the lower leg when she first tries to rest. She has no taken any medications.  She has a history of migraines starting in her teens. Pain is usually on the right side, described as throbbing/sharp pain. She has associated nausea, photosensitivity, and phonophobia. Duration is 4 hr, if she takes zomig, but they typically will occur for the next 3-4 days. She takes zomig which helps. She was started on topamax, but does not take it daily because of side effects.   She complains of one-month history of dizziness, described as lightheadedness. She feels nauseous when she moves her head too quickly. She denies any double vision. It is worse when she bends down to pick up something off the floor. No ear pain or tinnitus.   She had a fall last year and developed a bump on the side of her head which resolved over a few weeks.   UPDATE 01/14/2014:  Dizziness has resolved.  For the past two  weeks she has noticed swelling of her left foot and pain over the top of the foot. Denies any repetitive trauma to that area.  Her husband says that she is moving around less at nighttime.  Headaches are also well-controlled, last migraine about 3 weeks ago.    Medications:  Current Outpatient Prescriptions on File Prior to Visit  Medication Sig Dispense Refill  . albuterol (PROVENTIL HFA;VENTOLIN HFA) 108 (90 BASE) MCG/ACT inhaler Inhale 2 puffs into the lungs every 6 (six) hours as needed for wheezing or shortness of breath.  1 Inhaler  2  . candesartan (ATACAND) 16 MG tablet Take 16 mg by mouth at bedtime.       . cetirizine (ZYRTEC) 10 MG tablet Take 1 tablet (10 mg total) by mouth daily. One tab daily for allergies  30 tablet  1  . Estradiol 10 MCG TABS vaginal tablet Place 1 tablet (10 mcg total) vaginally 2 (two) times a week.  8 tablet  11  . fluticasone (FLONASE) 50 MCG/ACT nasal spray Place 1 spray into both nostrils daily.      . metaxalone (SKELAXIN) 800 MG tablet Take 800 mg by mouth 3 (three) times daily as needed for muscle spasms.       . mometasone (ASMANEX) 220 MCG/INH inhaler Inhale 2 puffs into the lungs daily.      . montelukast (SINGULAIR) 10 MG tablet Take 10 mg by mouth at bedtime.        Marland Kitchen omeprazole (PRILOSEC) 20 MG  capsule Take 1 capsule by mouth every morning.      . polyethylene glycol powder (GLYCOLAX/MIRALAX) powder Take 17 g by mouth daily.  850 g  1  . pravastatin (PRAVACHOL) 20 MG tablet Take 20 mg by mouth daily.        . ranitidine (ZANTAC) 300 MG tablet Take 1 tablet by mouth at bedtime as needed.      Marland Kitchen tiZANidine (ZANAFLEX) 4 MG tablet Take 1 tablet by mouth as needed.      . zolmitriptan (ZOMIG ZMT) 2.5 MG disintegrating tablet Take 1 tablet (2.5 mg total) by mouth as needed for migraine.  10 tablet  1  . zolpidem (AMBIEN) 10 MG tablet Take 10 mg by mouth at bedtime.      . [DISCONTINUED] dexlansoprazole (DEXILANT) 60 MG capsule Take 60 mg by mouth daily.          No current facility-administered medications on file prior to visit.    Allergies:  Allergies  Allergen Reactions  . Trazodone Hcl Other (See Comments)    REACTION: bad headaches  . Penicillins     Nausea      Review of Systems:  CONSTITUTIONAL: No fevers, chills, night sweats, or weight loss.   EYES: No visual changes or eye pain ENT: No hearing changes.  No history of nose bleeds.   RESPIRATORY: No cough, wheezing and shortness of breath.   CARDIOVASCULAR: Negative for chest pain, and palpitations.   GI: Negative for abdominal discomfort, blood in stools or black stools.  No recent change in bowel habits.   GU:  No history of incontinence.   MUSCLOSKELETAL: No history of joint pain or swelling.  No myalgias.   SKIN: Negative for lesions, rash, and itching.   ENDOCRINE: Negative for cold or heat intolerance, polydipsia or goiter.   PSYCH:  No depression or anxiety symptoms.   NEURO: As Above.   Vital Signs:  BP 134/84  Pulse 84  Ht _0  (1.6 m)  Wt 171 lb 8 oz (77.792 kg)  BMI 30.39 kg/m2  SpO2 95%  LMP 07/02/1994  Neurological Exam: MENTAL STATUS including orientation to time, place, person is normal.  Speech is not dysarthric.  CRANIAL NERVES:  Pupils equal round and reactive to light. Face is symmetric. Palate elevates symmetrically.  Tongue is midline.  MOTOR:  Motor strength is 5/5 in all extremities.  Left foot with mild edema and tenderness  MSRs:  Reflexes are 2+/4 throughout  SENSORY:  Intact to light touch  COORDINATION/GAIT:  Gait narrow based and stable.   Data: Labs 11/06/2013:  ESR 15, vitamin B12 241, TSH 1.9, ferritin 37  EEG 11/14/2013: This awake and asleep EEG is within normal limits.  MRI cervical spine wo contrast 05/14/2008: Cervical disc degeneration and spondylosis as above. No change from 2005.   MRI brain wo contrast 03/17/2004:  Unremarkable MRI of the brain. No acute findings. benign arachnoid granulations (giant  pacchionian granulations), and I feel that this finding represents a normal anatomic variant as opposed to pathology. At this point, I cannot make the diagnosis of a pathologic lesion such as an eosinophilic granuloma. Again I favor that this represents a benign process, i.e. giant arachnoid granulations.   MRI/A brain and MRA neck 12/05/2013: 1. Stable and negative MRI appearance of the brain; inadvertently  diffusion-weighted imaging was not performed. Attempts will be made  to brain the patient back for diffusion weighted imaging, and an  addendum will be dictated.  2. Negative MRA  head and neck.    IMPRESSION/PLAN: 1. Periodic limb movements of sleep   - Less likely RLS  - Clinically slightly improved  - Continue iron supplements 375m daily   2. Episodic migraine   - Headaches days ~3-5/month  - Continue zomig 2.544mas needed for acute migraine  3. Lightheadedness/dizziness - resolved  - MRI/A brain and MRA neck is normal  4.  Low vitamin B12  - Continue vitamin B12 100059mdaily  - Recheck level in one month, if levels remains low will transition to injection  5.  Left foot pain and swelling  - Rest, ice, elevation recommended  - Follow-up with PCP/rheumatologist  Return to clinic in 6-m7-monthThe duration of this appointment visit was 25 minutes of face-to-face time with the patient.  Greater than 50% of this time was spent in counseling, explanation of diagnosis, planning of further management, and coordination of care.   Thank you for allowing me to participate in patient's care.  If I can answer any additional questions, I would be pleased to do so.    Sincerely,    Danylle Ouk K. PatePosey Pronto

## 2014-01-14 NOTE — Patient Instructions (Signed)
1.  Check vitamin B12 in one month 2.  Continue iron and vitamin B12 supplements 3.  Rest, ice, and keep your left leg elevated 4.  Return to clinic in 80-months

## 2014-01-15 ENCOUNTER — Telehealth: Payer: Self-pay | Admitting: *Deleted

## 2014-01-15 DIAGNOSIS — E559 Vitamin D deficiency, unspecified: Secondary | ICD-10-CM

## 2014-01-15 LAB — VITAMIN D 25 HYDROXY (VIT D DEFICIENCY, FRACTURES): Vit D, 25-Hydroxy: 21 ng/mL — ABNORMAL LOW (ref 30–89)

## 2014-01-15 MED ORDER — VITAMIN D (ERGOCALCIFEROL) 1.25 MG (50000 UNIT) PO CAPS: 50000.0000 [IU] | ORAL_CAPSULE | ORAL | Status: DC

## 2014-01-15 NOTE — Telephone Encounter (Signed)
Message copied by Graylon Good on Tue Jan 15, 2014 10:15 AM ------      Message from: Kem Boroughs R      Created: Tue Jan 15, 2014  8:18 AM       As suspected her Vit D is low.  Restart Vit D per protocol. ------

## 2014-01-15 NOTE — Telephone Encounter (Signed)
Pt notified in result note.  RX sent.  Lab appt for 04/09/14.

## 2014-01-15 NOTE — Telephone Encounter (Signed)
Patient is returning a call to Stephanie. °

## 2014-01-15 NOTE — Telephone Encounter (Signed)
I have attempted to contact this patient by phone with the following results: left message to return call to [place] at [phone] answering machine (home per [facility]).  No personal information given.  [phone] (Home)

## 2014-01-15 NOTE — Addendum Note (Signed)
Addended by: Graylon Good on: 01/15/2014 04:24 PM   Modules accepted: Orders

## 2014-01-21 ENCOUNTER — Other Ambulatory Visit: Payer: Self-pay

## 2014-01-30 NOTE — Progress Notes (Signed)
Encounter reviewed by Dr. Brook Silva.  

## 2014-02-14 ENCOUNTER — Other Ambulatory Visit: Payer: Self-pay | Admitting: Neurology

## 2014-02-14 DIAGNOSIS — G4761 Periodic limb movement disorder: Secondary | ICD-10-CM | POA: Diagnosis not present

## 2014-02-14 DIAGNOSIS — G43111 Migraine with aura, intractable, with status migrainosus: Secondary | ICD-10-CM | POA: Diagnosis not present

## 2014-02-14 LAB — VITAMIN B12: VITAMIN B 12: 389 pg/mL (ref 211–911)

## 2014-02-15 DIAGNOSIS — J309 Allergic rhinitis, unspecified: Secondary | ICD-10-CM | POA: Diagnosis not present

## 2014-02-15 DIAGNOSIS — J370 Chronic laryngitis: Secondary | ICD-10-CM | POA: Diagnosis not present

## 2014-02-15 DIAGNOSIS — J4540 Moderate persistent asthma, uncomplicated: Secondary | ICD-10-CM | POA: Diagnosis not present

## 2014-02-15 DIAGNOSIS — Z23 Encounter for immunization: Secondary | ICD-10-CM | POA: Diagnosis not present

## 2014-02-21 ENCOUNTER — Telehealth: Payer: Self-pay | Admitting: Nurse Practitioner

## 2014-02-21 NOTE — Telephone Encounter (Signed)
Patient calling with questions about a bone density order as far as which facility she is going to and if the order has already been sent.

## 2014-02-21 NOTE — Telephone Encounter (Signed)
Order for BMD to [facility] to [employer], FNP's desk for review and signature.

## 2014-02-21 NOTE — Telephone Encounter (Signed)
Spoke with patient. Advised patient order has been placed for BMD to [facility] with fax confirmation. Patient is agreeable and will call to schedule.  Routing to provider for final review. Patient agreeable to disposition. Will close encounter

## 2014-02-25 ENCOUNTER — Encounter: Payer: Self-pay | Admitting: Gastroenterology

## 2014-02-25 ENCOUNTER — Ambulatory Visit (INDEPENDENT_AMBULATORY_CARE_PROVIDER_SITE_OTHER): Payer: Medicare Other | Admitting: Gastroenterology

## 2014-02-25 VITALS — BP 110/76 | HR 98 | Ht 64.0 in | Wt 172.4 lb

## 2014-02-25 DIAGNOSIS — Z86010 Personal history of colonic polyps: Secondary | ICD-10-CM

## 2014-02-25 DIAGNOSIS — K219 Gastro-esophageal reflux disease without esophagitis: Secondary | ICD-10-CM | POA: Diagnosis not present

## 2014-02-25 DIAGNOSIS — L810 Postinflammatory hyperpigmentation: Secondary | ICD-10-CM | POA: Diagnosis not present

## 2014-02-25 DIAGNOSIS — K5900 Constipation, unspecified: Secondary | ICD-10-CM

## 2014-02-25 MED ORDER — RANITIDINE HCL 300 MG PO TABS: 300.0000 mg | ORAL_TABLET | Freq: Every day | ORAL | Status: DC

## 2014-02-25 MED ORDER — OMEPRAZOLE 40 MG PO CPDR: 40.0000 mg | DELAYED_RELEASE_CAPSULE | Freq: Every day | ORAL | Status: DC

## 2014-02-25 MED ORDER — MOVIPREP 100 G PO SOLR: 1.0000 | Freq: Once | ORAL | Status: DC

## 2014-02-25 NOTE — Patient Instructions (Signed)
You have been scheduled for a colonoscopy. Please follow written instructions given to you at your visit today.  Please pick up your prep kit at the pharmacy within the next 1-3 days. If you use inhalers (even only as needed), please bring them with you on the day of your procedure. Your physician has requested that you go to www.startemmi.com and enter the access code given to you at your visit today. This web site gives a general overview about your procedure. However, you should still follow specific instructions given to you by our office regarding your preparation for the procedure.  Please increase Omeprazole to 40 mg, take one capsule by mouth thirty minutes before breakfast once daily  New prescription was sent to your pharmacy  Please purchase Miralax over the counter, take one capful and mix it in eight ounces of juice or water, drink once daily   Your Zantac 300 mg needs to be taken at bedtime, just one tablet

## 2014-02-25 NOTE — Progress Notes (Signed)
    History of Present Illness: This is a 63 year old female accompanied by her husband. She relates reflux symptoms that occur 3-4 times per week. She notes frequent throat clearing. She has chronic constipation and notes generalized abdominal pain for a few minutes to a few hours before bowel movements. Her pain is relieved by bowel movements. She takes Colace daily and MiraLAX daily as needed   Review of Systems: Pertinent positive and negative review of systems were noted in the above HPI section. All other review of systems were otherwise negative.  Current Medications, Allergies, Past Medical History, Past Surgical History, Family History and Social History were reviewed in [employer] record.  Physical Exam: General: Well developed , well nourished, no acute distress Head: Normocephalic and atraumatic Eyes:  sclerae anicteric, EOMI Ears: Normal auditory acuity Mouth: No deformity or lesions Neck: Supple, no masses or thyromegaly Lungs: Clear throughout to auscultation Heart: Regular rate and rhythm; no murmurs, rubs or bruits Abdomen: Soft, non tender and non distended. No masses, hepatosplenomegaly or hernias noted. Normal Bowel sounds Rectal: Deferred to colonoscopy. Musculoskeletal: Symmetrical with no gross deformities  Skin: No lesions on visible extremities Pulses:  Normal pulses noted Extremities: No clubbing, cyanosis, edema or deformities noted Neurological: Alert oriented x 4, grossly nonfocal Cervical Nodes:  No significant cervical adenopathy Inguinal Nodes: No significant inguinal adenopathy Psychological:  Alert and cooperative. Normal mood and affect  Assessment and Recommendations:  1. Personal history of adenomatous colon polyps. Schedule colonoscopy. The risks, benefits, and alternatives to colonoscopy with possible biopsy and possible polypectomy were discussed with the patient and they consent to proceed.   2. GERD. Increase omeprazole  to 40 mg qam and take ranitidine 300 mg hs every day, not just when necessary. Intensify antireflux measures.  3. Constipation, chronic. Continue Colace daily. Take MiraLAX every day, not just as needed.

## 2014-02-27 DIAGNOSIS — H40013 Open angle with borderline findings, low risk, bilateral: Secondary | ICD-10-CM | POA: Diagnosis not present

## 2014-03-04 ENCOUNTER — Encounter: Payer: Self-pay | Admitting: Gastroenterology

## 2014-03-08 ENCOUNTER — Encounter: Payer: Self-pay | Admitting: Gastroenterology

## 2014-03-08 ENCOUNTER — Ambulatory Visit (AMBULATORY_SURGERY_CENTER): Payer: Medicare Other | Admitting: Gastroenterology

## 2014-03-08 VITALS — BP 117/67 | HR 78 | Temp 97.3°F | Resp 19 | Ht 64.0 in | Wt 172.0 lb

## 2014-03-08 DIAGNOSIS — Z86010 Personal history of colonic polyps: Secondary | ICD-10-CM

## 2014-03-08 DIAGNOSIS — I10 Essential (primary) hypertension: Secondary | ICD-10-CM | POA: Diagnosis not present

## 2014-03-08 DIAGNOSIS — R002 Palpitations: Secondary | ICD-10-CM | POA: Diagnosis not present

## 2014-03-08 DIAGNOSIS — J45909 Unspecified asthma, uncomplicated: Secondary | ICD-10-CM | POA: Diagnosis not present

## 2014-03-08 DIAGNOSIS — M545 Low back pain: Secondary | ICD-10-CM | POA: Diagnosis not present

## 2014-03-08 DIAGNOSIS — E669 Obesity, unspecified: Secondary | ICD-10-CM | POA: Diagnosis not present

## 2014-03-08 MED ORDER — SODIUM CHLORIDE 0.9 % IV SOLN: 500.0000 mL | INTRAVENOUS | Status: DC

## 2014-03-08 NOTE — Progress Notes (Signed)
Report to PACU, RN, vss, BBS= Clear.  

## 2014-03-08 NOTE — Patient Instructions (Signed)
Impressions/recommendations:  Hemorrhoids (handout given)  Repeat colonoscopy in 5 years.  YOU HAD AN ENDOSCOPIC PROCEDURE TODAY AT [facility]: Refer to the procedure report that was given to you for any specific questions about what was found during the examination.  If the procedure report does not answer your questions, please call your gastroenterologist to clarify.  If you requested that your care partner not be given the details of your procedure findings, then the procedure report has been included in a sealed envelope for you to review at your convenience later.  YOU SHOULD EXPECT: Some feelings of bloating in the abdomen. Passage of more gas than usual.  Walking can help get rid of the air that was put into your GI tract during the procedure and reduce the bloating. If you had a lower endoscopy (such as a colonoscopy or flexible sigmoidoscopy) you may notice spotting of blood in your stool or on the toilet paper. If you underwent a bowel prep for your procedure, then you may not have a normal bowel movement for a few days.  DIET: Your first meal following the procedure should be a light meal and then it is ok to progress to your normal diet.  A half-sandwich or bowl of soup is an example of a good first meal.  Heavy or fried foods are harder to digest and may make you feel nauseous or bloated.  Likewise meals heavy in dairy and vegetables can cause extra gas to form and this can also increase the bloating.  Drink plenty of fluids but you should avoid alcoholic beverages for 24 hours.  ACTIVITY: Your care partner should take you home directly after the procedure.  You should plan to take it easy, moving slowly for the rest of the day.  You can resume normal activity the day after the procedure however you should NOT DRIVE or use heavy machinery for 24 hours (because of the sedation medicines used during the test).    SYMPTOMS TO REPORT IMMEDIATELY: A gastroenterologist can be  reached at any hour.  During normal business hours, 8:30 AM to 5:00 PM Monday through Friday, call [phone].  After hours and on weekends, please call the GI answering service at [phone] who will take a message and have the physician on call contact you.   Following lower endoscopy (colonoscopy or flexible sigmoidoscopy):  Excessive amounts of blood in the stool  Significant tenderness or worsening of abdominal pains  Swelling of the abdomen that is new, acute  Fever of 100F or higher   FOLLOW UP: If any biopsies were taken you will be contacted by phone or by letter within the next 1-3 weeks.  Call your gastroenterologist if you have not heard about the biopsies in 3 weeks.  Our staff will call the home number listed on your records the next business day following your procedure to check on you and address any questions or concerns that you may have at that time regarding the information given to you following your procedure. This is a courtesy call and so if there is no answer at the home number and we have not heard from you through the emergency physician on call, we will assume that you have returned to your regular daily activities without incident.  SIGNATURES/CONFIDENTIALITY: You and/or your care partner have signed paperwork which will be entered into your electronic medical record.  These signatures attest to the fact that that the information above on your After Visit Summary has  been reviewed and is understood.  Full responsibility of the confidentiality of this discharge information lies with you and/or your care-partner.

## 2014-03-08 NOTE — Op Note (Signed)
[place]  [address] [place], [address]   COLONOSCOPY PROCEDURE REPORT  PATIENT: Christina, Deleon  MR#: [id number] BIRTHDATE: 06/01/1950 , 24  yrs. old GENDER: female ENDOSCOPIST: Ladene Artist, MD, [facility] PROCEDURE DATE:  03/08/2014 PROCEDURE:   Colonoscopy, surveillance First Screening Colonoscopy - Avg.  risk and is 50 yrs.  old or older - No.  Prior Negative Screening - Now for repeat screening. N/A  History of Adenoma - Now for follow-up colonoscopy & has been > or = to 3 yrs.  Yes hx of adenoma.  Has been 3 or more years since last colonoscopy.  Polyps Removed Today? No.  Polyps Removed Today? No.  Recommend repeat exam, <10 yrs? Polyps Removed Today? No.  Recommend repeat exam, <10 yrs? Yes.  Polyps Removed Today? No.  Recommend repeat exam, <10 yrs? Yes.  High risk (family or personal hx). ASA CLASS:   Class II INDICATIONS:surveillance colonoscopy based on a history of adenomatous colonic polyp(s). MEDICATIONS: Monitored anesthesia care and Propofol 240 mg IV DESCRIPTION OF PROCEDURE:   After the risks benefits and alternatives of the procedure were thoroughly explained, informed consent was obtained.  The digital rectal exam revealed no abnormalities of the rectum.   The LB [id number]  endoscope was introduced through the anus and advanced to the cecum, which was identified by both the appendix and ileocecal valve. No adverse events experienced.   The quality of the prep was excellent, using MoviPrep  The instrument was then slowly withdrawn as the colon was fully examined.    COLON FINDINGS: A normal appearing cecum, ileocecal valve, and appendiceal orifice were identified.  The ascending, transverse, descending, sigmoid colon, and rectum appeared unremarkable. Retroflexed views revealed internal Grade II hemorrhoids. The time to cecum=2 minutes 54 seconds.  Withdrawal time=10 minutes 59 seconds.  The scope was withdrawn and the procedure  completed.  COMPLICATIONS: There were no immediate complications.  ENDOSCOPIC IMPRESSION: 1.  Normal colonoscopy 2.  Grade II internal hemorrhoids  RECOMMENDATIONS: 1.  Repeat Colonoscopy in 5 years.  eSigned:  Ladene Artist, MD, [facility] 03/08/2014 9:19 AM

## 2014-03-11 ENCOUNTER — Telehealth: Payer: Self-pay | Admitting: *Deleted

## 2014-03-11 NOTE — Telephone Encounter (Signed)
  Follow up Call-  Call back number 03/08/2014  Post procedure Call Back phone  # [phone]  Permission to leave phone message Yes     Patient questions:  Do you have a fever, pain , or abdominal swelling? No. Pain Score  0 *  Have you tolerated food without any problems? Yes.    Have you been able to return to your normal activities? Yes.    Do you have any questions about your discharge instructions: Diet   No. Medications  No. Follow up visit  No.  Do you have questions or concerns about your Care? No.  Actions: * If pain score is 4 or above: No action needed, pain Patient "resting' per husband. Husband stating she took Tylenol yesterday related to low grade temperature. Husband stating he did not know the temperature or if she took it. Husband stating she is back to normal activities, no pain, swelling of abdomen or rectal bleeding. Instructed husband for patient to call us if needed. Husband agreed with plan.

## 2014-04-09 ENCOUNTER — Other Ambulatory Visit: Payer: Self-pay

## 2014-04-11 DIAGNOSIS — M8580 Other specified disorders of bone density and structure, unspecified site: Secondary | ICD-10-CM | POA: Diagnosis not present

## 2014-04-11 DIAGNOSIS — E559 Vitamin D deficiency, unspecified: Secondary | ICD-10-CM | POA: Diagnosis not present

## 2014-04-12 ENCOUNTER — Other Ambulatory Visit (INDEPENDENT_AMBULATORY_CARE_PROVIDER_SITE_OTHER): Payer: Medicare Other

## 2014-04-12 DIAGNOSIS — E559 Vitamin D deficiency, unspecified: Secondary | ICD-10-CM

## 2014-04-12 DIAGNOSIS — G43101 Migraine with aura, not intractable, with status migrainosus: Secondary | ICD-10-CM | POA: Diagnosis not present

## 2014-04-12 DIAGNOSIS — G4761 Periodic limb movement disorder: Secondary | ICD-10-CM | POA: Diagnosis not present

## 2014-04-12 DIAGNOSIS — G43111 Migraine with aura, intractable, with status migrainosus: Secondary | ICD-10-CM | POA: Diagnosis not present

## 2014-04-12 LAB — VITAMIN B12: Vitamin B-12: 392 pg/mL (ref 211–911)

## 2014-04-13 LAB — VITAMIN D 25 HYDROXY (VIT D DEFICIENCY, FRACTURES): Vit D, 25-Hydroxy: 28 ng/mL — ABNORMAL LOW (ref 30–100)

## 2014-04-16 ENCOUNTER — Telehealth: Payer: Self-pay | Admitting: Nurse Practitioner

## 2014-04-16 DIAGNOSIS — M797 Fibromyalgia: Secondary | ICD-10-CM | POA: Diagnosis not present

## 2014-04-16 DIAGNOSIS — M19041 Primary osteoarthritis, right hand: Secondary | ICD-10-CM | POA: Diagnosis not present

## 2014-04-16 DIAGNOSIS — M542 Cervicalgia: Secondary | ICD-10-CM | POA: Diagnosis not present

## 2014-04-16 DIAGNOSIS — M7062 Trochanteric bursitis, left hip: Secondary | ICD-10-CM | POA: Diagnosis not present

## 2014-04-16 NOTE — Telephone Encounter (Signed)
Please let patient know that BMD done on  04/11/14 reveal that her T Score at the spine is 0.4; the left and right hip neck is -1.5.  This puts her in the Osteopenic range for the hips and normal at the spine.  Comparison studies from 04/2012 shows an increase at the spine and a decrease of 4% at the left hip and 0.6% decease of the right hip.  While some decrease is expectant in postmenopausal women we do not want this decrease to persist.  It is very important that she participate in some kind of walking or aerobic exercise.  She also needs to continue with calcium and Vit D.  The FRAX score for 10 year risk of major fracture is 14.4 % (goal is <20%).  The 10 year FRAX score for hip is 1.5 % (goal is < 3%).

## 2014-04-17 NOTE — Telephone Encounter (Signed)
-----   Message from Milford Cage, [place] sent at 04/15/2014  8:28 AM EST ----- Let patient know that Vit D went from 21 -28.  Have her to continue with RX Vit D weekly until next AEX.

## 2014-04-17 NOTE — Telephone Encounter (Signed)
I have attempted to contact this patient by phone with the following results: left message to return call to [place] at [phone] answering machine (home per [facility]).  No personal information given.  [phone] (Home) *Preferred*

## 2014-04-18 NOTE — Telephone Encounter (Signed)
Patient notified of BMD and Vitamin D results, will continue the Vitamin D 50,000 weekly and will start participating in walking or some form of aerobic exercise. Will continue taking Vitamin D and calcium.  Routed to provider for review, encounter closed.

## 2014-05-21 ENCOUNTER — Other Ambulatory Visit: Payer: Self-pay | Admitting: Internal Medicine

## 2014-05-21 ENCOUNTER — Other Ambulatory Visit: Payer: Medicare Other

## 2014-05-21 DIAGNOSIS — I10 Essential (primary) hypertension: Secondary | ICD-10-CM | POA: Diagnosis not present

## 2014-05-21 DIAGNOSIS — R110 Nausea: Secondary | ICD-10-CM

## 2014-05-21 DIAGNOSIS — G43009 Migraine without aura, not intractable, without status migrainosus: Secondary | ICD-10-CM | POA: Diagnosis not present

## 2014-05-21 DIAGNOSIS — R946 Abnormal results of thyroid function studies: Secondary | ICD-10-CM | POA: Diagnosis not present

## 2014-05-21 DIAGNOSIS — M5030 Other cervical disc degeneration, unspecified cervical region: Secondary | ICD-10-CM | POA: Diagnosis not present

## 2014-05-21 DIAGNOSIS — R42 Dizziness and giddiness: Secondary | ICD-10-CM | POA: Diagnosis not present

## 2014-05-23 ENCOUNTER — Ambulatory Visit
Admission: RE | Admit: 2014-05-23 | Discharge: 2014-05-23 | Disposition: A | Discharge status: Home / Self Care | Payer: Medicare Other | Source: Ambulatory Visit | Attending: Internal Medicine | Admitting: Internal Medicine

## 2014-05-23 DIAGNOSIS — R110 Nausea: Secondary | ICD-10-CM

## 2014-05-28 DIAGNOSIS — M545 Low back pain: Secondary | ICD-10-CM | POA: Diagnosis not present

## 2014-05-28 DIAGNOSIS — M4712 Other spondylosis with myelopathy, cervical region: Secondary | ICD-10-CM | POA: Diagnosis not present

## 2014-05-29 ENCOUNTER — Other Ambulatory Visit: Payer: Self-pay | Admitting: Orthopedic Surgery

## 2014-05-29 DIAGNOSIS — M542 Cervicalgia: Secondary | ICD-10-CM

## 2014-06-13 ENCOUNTER — Ambulatory Visit
Admission: RE | Admit: 2014-06-13 | Discharge: 2014-06-13 | Disposition: A | Discharge status: Home / Self Care | Payer: Medicare Other | Source: Ambulatory Visit | Attending: Orthopedic Surgery | Admitting: Orthopedic Surgery

## 2014-06-13 DIAGNOSIS — M47816 Spondylosis without myelopathy or radiculopathy, lumbar region: Secondary | ICD-10-CM | POA: Diagnosis not present

## 2014-06-13 DIAGNOSIS — M542 Cervicalgia: Secondary | ICD-10-CM

## 2014-06-13 DIAGNOSIS — M5136 Other intervertebral disc degeneration, lumbar region: Secondary | ICD-10-CM | POA: Diagnosis not present

## 2014-06-25 DIAGNOSIS — M7071 Other bursitis of hip, right hip: Secondary | ICD-10-CM | POA: Diagnosis not present

## 2014-06-25 DIAGNOSIS — M19041 Primary osteoarthritis, right hand: Secondary | ICD-10-CM | POA: Diagnosis not present

## 2014-06-25 DIAGNOSIS — M797 Fibromyalgia: Secondary | ICD-10-CM | POA: Diagnosis not present

## 2014-06-25 DIAGNOSIS — M5030 Other cervical disc degeneration, unspecified cervical region: Secondary | ICD-10-CM | POA: Diagnosis not present

## 2014-06-25 DIAGNOSIS — M5137 Other intervertebral disc degeneration, lumbosacral region: Secondary | ICD-10-CM | POA: Diagnosis not present

## 2014-07-15 ENCOUNTER — Ambulatory Visit (INDEPENDENT_AMBULATORY_CARE_PROVIDER_SITE_OTHER): Payer: Medicare Other | Admitting: Neurology

## 2014-07-15 ENCOUNTER — Encounter: Payer: Self-pay | Admitting: Neurology

## 2014-07-15 VITALS — BP 130/84 | HR 83 | Ht 64.0 in | Wt 170.0 lb

## 2014-07-15 DIAGNOSIS — G4761 Periodic limb movement disorder: Secondary | ICD-10-CM | POA: Diagnosis not present

## 2014-07-15 DIAGNOSIS — G43101 Migraine with aura, not intractable, with status migrainosus: Secondary | ICD-10-CM | POA: Diagnosis not present

## 2014-07-15 NOTE — Patient Instructions (Addendum)
1.  Start drinking more water 2.  Encouraged to start doing water exercises 3.  Return to clinic in 6 months

## 2014-07-15 NOTE — Progress Notes (Signed)
Follow-up Visit   Date: 07/15/2014    Christina Deleon MRN: [id number] DOB: Oct 04, 1950   Interim History: Christina Deleon is a 64 y.o. right-handed African American female with history of GERD, hypertension, hyperlipidemia, fibromyalgia, and migraines returning to the clinic for follow-up of periodic limb movement of sleep and migraines.  History of present illness: Starting around 2005, her husband started noticing right leg movements especially at night. She lays still about 20-30 seconds followed by episodic jerking of the legs, especially the right side. This occurs only at nighttime for the first few hours of sleep. She takes [place] and about 40 minutes later, the movements start. She does not feel refreshed in the morning. She had a sleep study and was told that she did not need a "machine" and she did have "movement of the legs". She endorses vivid dreams. No history of sleep walking. She also complaints of "itchy sensation" of the lower leg when she first tries to rest. She has no taken any medications.  She has a history of migraines starting in her teens. Pain is usually on the right side, described as throbbing/sharp pain. She has associated nausea, photosensitivity, and phonophobia. Duration is 4 hr, if she takes zomig, but they typically will occur for the next 3-4 days. She takes zomig which helps. She was started on topamax, but does not take it daily because of side effects.   She complains of one-month history of dizziness, described as lightheadedness. She feels nauseous when she moves her head too quickly. She denies any double vision. It is worse when she bends down to pick up something off the floor. No ear pain or tinnitus.   She had a fall last year and developed a bump on the side of her head which resolved over a few weeks.   UPDATE 01/14/2014:  Dizziness has resolved.  For the past two weeks she has noticed swelling of her left foot and pain over the top of the foot.  Denies any repetitive trauma to that area.  Her husband says that she is moving around less at nighttime.  Headaches are also well-controlled, last migraine about 3 weeks ago.  UPDATE 07/15/2014:  She feels that she is about the same, with some good days and bad days.  Migraines continue to occur about 3 times per month and responds to zomig.  Balance continues to be a problem every now and then, but she has not fallen and does not complain of stumbling.  No new complaints.     Medications:  Current Outpatient Prescriptions on File Prior to Visit  Medication Sig Dispense Refill  . albuterol (PROVENTIL HFA;VENTOLIN HFA) 108 (90 BASE) MCG/ACT inhaler Inhale 2 puffs into the lungs every 6 (six) hours as needed for wheezing or shortness of breath. 1 Inhaler 2  . candesartan (ATACAND) 16 MG tablet Take 16 mg by mouth at bedtime.     . cetirizine (ZYRTEC) 10 MG tablet Take 1 tablet (10 mg total) by mouth daily. One tab daily for allergies 30 tablet 1  . Estradiol 10 MCG TABS vaginal tablet Place 1 tablet (10 mcg total) vaginally 2 (two) times a week. 8 tablet 11  . fluticasone (FLONASE) 50 MCG/ACT nasal spray Place 1 spray into both nostrils daily.    . mometasone (ASMANEX) 220 MCG/INH inhaler Inhale 2 puffs into the lungs daily.    . montelukast (SINGULAIR) 10 MG tablet Take 10 mg by mouth at bedtime.      Marland Kitchen  omeprazole (PRILOSEC) 40 MG capsule Take 1 capsule (40 mg total) by mouth daily. 90 capsule 3  . polyethylene glycol powder (GLYCOLAX/MIRALAX) powder Take 17 g by mouth daily. 850 g 1  . pravastatin (PRAVACHOL) 20 MG tablet Take 20 mg by mouth daily.      . ranitidine (ZANTAC) 300 MG tablet Take 1 tablet (300 mg total) by mouth at bedtime. 30 tablet 2  . tiZANidine (ZANAFLEX) 4 MG tablet Take 1 tablet by mouth as needed.    . Vitamin D, Ergocalciferol, (DRISDOL) 50000 UNITS CAPS capsule Take 1 capsule (50,000 Units total) by mouth every 7 (seven) days. 30 capsule 3  . zolmitriptan (ZOMIG ZMT) 2.5  MG disintegrating tablet Take 1 tablet (2.5 mg total) by mouth as needed for migraine. 10 tablet 1  . zolpidem (AMBIEN) 10 MG tablet Take 10 mg by mouth at bedtime.    . [DISCONTINUED] dexlansoprazole (DEXILANT) 60 MG capsule Take 60 mg by mouth daily.       No current facility-administered medications on file prior to visit.    Allergies:  Allergies  Allergen Reactions  . Trazodone Hcl Other (See Comments)    REACTION: bad headaches  . Penicillins     Nausea      Review of Systems:  CONSTITUTIONAL: No fevers, chills, night sweats, or weight loss.   EYES: No visual changes or eye pain ENT: No hearing changes.  No history of nose bleeds.   RESPIRATORY: No cough, wheezing and shortness of breath.   CARDIOVASCULAR: Negative for chest pain, and palpitations.   GI: Negative for abdominal discomfort, blood in stools or black stools.  No recent change in bowel habits.   GU:  No history of incontinence.   MUSCLOSKELETAL: No history of joint pain or swelling.  No myalgias.   SKIN: Negative for lesions, rash, and itching.   ENDOCRINE: Negative for cold or heat intolerance, polydipsia or goiter.   PSYCH:  No depression or anxiety symptoms.   NEURO: As Above.   Vital Signs:  BP 130/84 mmHg  Pulse 83  Ht _0  (1.626 m)  Wt 170 lb (77.111 kg)  BMI 29.17 kg/m2  SpO2 98%  LMP 07/02/1994  Neurological Exam: MENTAL STATUS including orientation to time, place, person is normal.  Speech is not dysarthric.  CRANIAL NERVES:  Pupils equal round and reactive to light. Face is symmetric. Palate elevates symmetrically.  Tongue is midline.  MOTOR:  Motor strength is 5/5 in all extremities.    MSRs:  Reflexes are 2+/4 throughout  COORDINATION/GAIT:  Gait narrow based and stable.   Data: Labs 11/06/2013:  ESR 15, vitamin B12 241, TSH 1.9, ferritin 37  EEG 11/14/2013: This awake and asleep EEG is within normal limits.  MRI cervical spine wo contrast 05/14/2008: Cervical disc degeneration and  spondylosis as above. No change from 2005.   MRI brain wo contrast 03/17/2004:  Unremarkable MRI of the brain. No acute findings. benign arachnoid granulations (giant pacchionian granulations), and I feel that this finding represents a normal anatomic variant as opposed to pathology. At this point, I cannot make the diagnosis of a pathologic lesion such as an eosinophilic granuloma. Again I favor that this represents a benign process, i.e. giant arachnoid granulations.   MRI/A brain and MRA neck 12/05/2013: 1. Stable and negative MRI appearance of the brain; inadvertently  diffusion-weighted imaging was not performed. Attempts will be made to brain the patient back for diffusion weighted imaging, and an  addendum will be dictated.  2. Negative MRA head and neck.    IMPRESSION/PLAN: 1. Periodic limb movements of sleep   - Less likely RLS  - Clinically slightly improved  - Continue iron supplements 361m daily   2. Episodic migraine   - Headaches days ~3-5/month  - Continue zomig 2.574mas needed for acute migraine  3. Lightheadedness/dizziness, intermittent  - MRI/A brain and MRA neck is normal  - Encouraged to drink water  - Start water exercises  4.  Low vitamin B12  - Continue vitamin B12 100016mdaily  Return to clinic in 6 months  The duration of this appointment visit was 20 minutes of face-to-face time with the patient.  Greater than 50% of this time was spent in counseling, explanation of diagnosis, planning of further management, and coordination of care.   Thank you for allowing me to participate in patient's care.  If I can answer any additional questions, I would be pleased to do so.    Sincerely,    Donika K. PatPosey ProntoO

## 2014-07-29 DIAGNOSIS — M542 Cervicalgia: Secondary | ICD-10-CM | POA: Diagnosis not present

## 2014-08-01 DIAGNOSIS — M67449 Ganglion, unspecified hand: Secondary | ICD-10-CM | POA: Diagnosis not present

## 2014-08-06 DIAGNOSIS — M542 Cervicalgia: Secondary | ICD-10-CM | POA: Diagnosis not present

## 2014-08-06 DIAGNOSIS — M545 Low back pain: Secondary | ICD-10-CM | POA: Diagnosis not present

## 2014-08-13 DIAGNOSIS — M545 Low back pain: Secondary | ICD-10-CM | POA: Diagnosis not present

## 2014-08-13 DIAGNOSIS — M542 Cervicalgia: Secondary | ICD-10-CM | POA: Diagnosis not present

## 2014-08-15 DIAGNOSIS — M545 Low back pain: Secondary | ICD-10-CM | POA: Diagnosis not present

## 2014-08-15 DIAGNOSIS — M542 Cervicalgia: Secondary | ICD-10-CM | POA: Diagnosis not present

## 2014-08-20 DIAGNOSIS — M545 Low back pain: Secondary | ICD-10-CM | POA: Diagnosis not present

## 2014-08-20 DIAGNOSIS — M542 Cervicalgia: Secondary | ICD-10-CM | POA: Diagnosis not present

## 2014-08-22 DIAGNOSIS — M545 Low back pain: Secondary | ICD-10-CM | POA: Diagnosis not present

## 2014-08-22 DIAGNOSIS — M542 Cervicalgia: Secondary | ICD-10-CM | POA: Diagnosis not present

## 2014-08-27 DIAGNOSIS — M542 Cervicalgia: Secondary | ICD-10-CM | POA: Diagnosis not present

## 2014-08-27 DIAGNOSIS — M545 Low back pain: Secondary | ICD-10-CM | POA: Diagnosis not present

## 2014-08-29 DIAGNOSIS — M542 Cervicalgia: Secondary | ICD-10-CM | POA: Diagnosis not present

## 2014-08-29 DIAGNOSIS — M545 Low back pain: Secondary | ICD-10-CM | POA: Diagnosis not present

## 2014-09-03 DIAGNOSIS — M545 Low back pain: Secondary | ICD-10-CM | POA: Diagnosis not present

## 2014-09-03 DIAGNOSIS — M542 Cervicalgia: Secondary | ICD-10-CM | POA: Diagnosis not present

## 2014-09-05 DIAGNOSIS — M542 Cervicalgia: Secondary | ICD-10-CM | POA: Diagnosis not present

## 2014-09-05 DIAGNOSIS — M545 Low back pain: Secondary | ICD-10-CM | POA: Diagnosis not present

## 2014-09-10 DIAGNOSIS — M542 Cervicalgia: Secondary | ICD-10-CM | POA: Diagnosis not present

## 2014-09-10 DIAGNOSIS — M545 Low back pain: Secondary | ICD-10-CM | POA: Diagnosis not present

## 2014-09-12 DIAGNOSIS — M542 Cervicalgia: Secondary | ICD-10-CM | POA: Diagnosis not present

## 2014-09-12 DIAGNOSIS — M545 Low back pain: Secondary | ICD-10-CM | POA: Diagnosis not present

## 2014-09-17 DIAGNOSIS — M542 Cervicalgia: Secondary | ICD-10-CM | POA: Diagnosis not present

## 2014-09-19 DIAGNOSIS — M545 Low back pain: Secondary | ICD-10-CM | POA: Diagnosis not present

## 2014-09-19 DIAGNOSIS — M542 Cervicalgia: Secondary | ICD-10-CM | POA: Diagnosis not present

## 2014-09-26 DIAGNOSIS — M67449 Ganglion, unspecified hand: Secondary | ICD-10-CM | POA: Diagnosis not present

## 2014-09-27 DIAGNOSIS — M542 Cervicalgia: Secondary | ICD-10-CM | POA: Diagnosis not present

## 2014-09-27 DIAGNOSIS — M545 Low back pain: Secondary | ICD-10-CM | POA: Diagnosis not present

## 2014-10-01 DIAGNOSIS — G4700 Insomnia, unspecified: Secondary | ICD-10-CM | POA: Diagnosis not present

## 2014-10-01 DIAGNOSIS — M542 Cervicalgia: Secondary | ICD-10-CM | POA: Diagnosis not present

## 2014-10-01 DIAGNOSIS — R5381 Other malaise: Secondary | ICD-10-CM | POA: Diagnosis not present

## 2014-10-01 DIAGNOSIS — M797 Fibromyalgia: Secondary | ICD-10-CM | POA: Diagnosis not present

## 2014-10-04 DIAGNOSIS — M67441 Ganglion, right hand: Secondary | ICD-10-CM | POA: Diagnosis not present

## 2014-10-04 DIAGNOSIS — M71341 Other bursal cyst, right hand: Secondary | ICD-10-CM | POA: Diagnosis not present

## 2014-10-10 DIAGNOSIS — H2513 Age-related nuclear cataract, bilateral: Secondary | ICD-10-CM | POA: Diagnosis not present

## 2014-10-10 DIAGNOSIS — H40013 Open angle with borderline findings, low risk, bilateral: Secondary | ICD-10-CM | POA: Diagnosis not present

## 2014-10-10 DIAGNOSIS — H43393 Other vitreous opacities, bilateral: Secondary | ICD-10-CM | POA: Diagnosis not present

## 2014-10-10 DIAGNOSIS — H04123 Dry eye syndrome of bilateral lacrimal glands: Secondary | ICD-10-CM | POA: Diagnosis not present

## 2014-10-15 DIAGNOSIS — M67441 Ganglion, right hand: Secondary | ICD-10-CM | POA: Diagnosis not present

## 2014-10-28 DIAGNOSIS — Z79899 Other long term (current) drug therapy: Secondary | ICD-10-CM | POA: Diagnosis not present

## 2014-12-03 DIAGNOSIS — J45901 Unspecified asthma with (acute) exacerbation: Secondary | ICD-10-CM | POA: Diagnosis not present

## 2014-12-03 DIAGNOSIS — J309 Allergic rhinitis, unspecified: Secondary | ICD-10-CM | POA: Diagnosis not present

## 2014-12-13 ENCOUNTER — Other Ambulatory Visit: Payer: Self-pay | Admitting: Internal Medicine

## 2014-12-13 ENCOUNTER — Ambulatory Visit
Admission: RE | Admit: 2014-12-13 | Discharge: 2014-12-13 | Disposition: A | Discharge status: Home / Self Care | Payer: Medicare Other | Source: Ambulatory Visit | Attending: Internal Medicine | Admitting: Internal Medicine

## 2014-12-13 DIAGNOSIS — I10 Essential (primary) hypertension: Secondary | ICD-10-CM | POA: Diagnosis not present

## 2014-12-13 DIAGNOSIS — J4520 Mild intermittent asthma, uncomplicated: Secondary | ICD-10-CM | POA: Diagnosis not present

## 2014-12-13 DIAGNOSIS — M797 Fibromyalgia: Secondary | ICD-10-CM | POA: Diagnosis not present

## 2014-12-13 DIAGNOSIS — E785 Hyperlipidemia, unspecified: Secondary | ICD-10-CM | POA: Diagnosis not present

## 2014-12-13 DIAGNOSIS — Z1389 Encounter for screening for other disorder: Secondary | ICD-10-CM | POA: Diagnosis not present

## 2014-12-13 DIAGNOSIS — J45909 Unspecified asthma, uncomplicated: Secondary | ICD-10-CM | POA: Diagnosis not present

## 2014-12-13 DIAGNOSIS — Z0000 Encounter for general adult medical examination without abnormal findings: Secondary | ICD-10-CM | POA: Diagnosis not present

## 2014-12-24 DIAGNOSIS — M170 Bilateral primary osteoarthritis of knee: Secondary | ICD-10-CM | POA: Diagnosis not present

## 2014-12-24 DIAGNOSIS — M5030 Other cervical disc degeneration, unspecified cervical region: Secondary | ICD-10-CM | POA: Diagnosis not present

## 2014-12-24 DIAGNOSIS — M797 Fibromyalgia: Secondary | ICD-10-CM | POA: Diagnosis not present

## 2014-12-24 DIAGNOSIS — M19041 Primary osteoarthritis, right hand: Secondary | ICD-10-CM | POA: Diagnosis not present

## 2015-01-16 ENCOUNTER — Encounter: Payer: Self-pay | Admitting: Neurology

## 2015-01-16 ENCOUNTER — Ambulatory Visit (INDEPENDENT_AMBULATORY_CARE_PROVIDER_SITE_OTHER): Payer: Medicare Other | Admitting: Neurology

## 2015-01-16 VITALS — BP 130/78 | HR 95 | Ht 63.0 in | Wt 171.5 lb

## 2015-01-16 DIAGNOSIS — G4761 Periodic limb movement disorder: Secondary | ICD-10-CM

## 2015-01-16 DIAGNOSIS — R2681 Unsteadiness on feet: Secondary | ICD-10-CM | POA: Diagnosis not present

## 2015-01-16 DIAGNOSIS — G43101 Migraine with aura, not intractable, with status migrainosus: Secondary | ICD-10-CM | POA: Diagnosis not present

## 2015-01-16 NOTE — Patient Instructions (Addendum)
1.  Start balance therapy 2.  Continue zomig 2.5mg  at onset of severe headache.  Limit to 9 times per month 3.  Return to clinic in 9 months

## 2015-01-16 NOTE — Progress Notes (Signed)
Follow-up Visit   Date: 01/16/2015    ROSS HEFFERAN MRN: [id number] DOB: 09/07/1950   Interim History: Christina Deleon is a 64 y.o. right-handed African American female with history of GERD, hypertension, hyperlipidemia, fibromyalgia, and migraines returning to the clinic for follow-up of periodic limb movement of sleep and migraines.  History of present illness: Starting around 2005, her husband started noticing right leg movements especially at night. She lays still about 20-30 seconds followed by episodic jerking of the legs, especially the right side. This occurs only at nighttime for the first few hours of sleep. She takes [place] and about 40 minutes later, the movements start. She had a sleep study and was told that she did have "movement of the legs". She endorses vivid dreams. No history of sleep walking. She has no taken any medications.  She has a history of migraines starting in her teens. Pain is usually on the right side, described as throbbing/sharp pain. She has associated nausea, photosensitivity, and phonophobia. Duration is 4 hr, if she takes zomig, but they typically will occur for the next 3-4 days. She takes zomig which helps. She was started on topamax, but does not take it daily because of side effects.   She complains of one-month history of dizziness, described as lightheadedness. She feels nauseous when she moves her head too quickly. She denies any double vision. It is worse when she bends down to pick up something off the floor. No ear pain or tinnitus.   She had a fall last year and developed a bump on the side of her head which resolved over a few weeks.   UPDATE 01/14/2014:  Dizziness has resolved.  For the past two weeks she has noticed swelling of her left foot and pain over the top of the foot. Denies any repetitive trauma to that area.  Her husband says that she is moving around less at nighttime.  Headaches are also well-controlled, last migraine about 3 weeks  ago.  UPDATE 07/15/2014:  She feels that she is about the same, with some good days and bad days.  Migraines continue to occur about 3 times per month and responds to zomig.  Balance continues to be a problem every now and then, but she has not fallen and does not complain of stumbling.  No new complaints.    UPDATE 01/16/2015:  She continues to have migraines, last month it lasted 3 days but also reports this was in the setting a lot of stress. She is using her Zomig 3-4 times per month which helps, which usually starts to have benefit within 30-minutes. She continues to have rare spells of veering to a side when walking. She sometimes has to hold onto her husband for support.  No falls. Walking independently.     Medications:  Current Outpatient Prescriptions on File Prior to Visit  Medication Sig Dispense Refill  . acetaminophen-codeine (TYLENOL #3) 300-30 MG per tablet     . albuterol (PROVENTIL HFA;VENTOLIN HFA) 108 (90 BASE) MCG/ACT inhaler Inhale 2 puffs into the lungs every 6 (six) hours as needed for wheezing or shortness of breath. 1 Inhaler 2  . candesartan (ATACAND) 16 MG tablet Take 16 mg by mouth at bedtime.     . cetirizine (ZYRTEC) 10 MG tablet Take 1 tablet (10 mg total) by mouth daily. One tab daily for allergies 30 tablet 1  . Estradiol 10 MCG TABS vaginal tablet Place 1 tablet (10 mcg total) vaginally 2 (two) times  a week. 8 tablet 11  . fluticasone (FLONASE) 50 MCG/ACT nasal spray Place 1 spray into both nostrils daily.    . methocarbamol (ROBAXIN) 500 MG tablet     . mometasone (ASMANEX) 220 MCG/INH inhaler Inhale 2 puffs into the lungs daily.    . montelukast (SINGULAIR) 10 MG tablet Take 10 mg by mouth at bedtime.      Marland Kitchen omeprazole (PRILOSEC) 40 MG capsule Take 1 capsule (40 mg total) by mouth daily. 90 capsule 3  . polyethylene glycol powder (GLYCOLAX/MIRALAX) powder Take 17 g by mouth daily. 850 g 1  . pravastatin (PRAVACHOL) 20 MG tablet Take 20 mg by mouth daily.        . ranitidine (ZANTAC) 300 MG tablet Take 1 tablet (300 mg total) by mouth at bedtime. 30 tablet 2  . tiZANidine (ZANAFLEX) 4 MG tablet Take 1 tablet by mouth as needed.    . Vitamin D, Ergocalciferol, (DRISDOL) 50000 UNITS CAPS capsule Take 1 capsule (50,000 Units total) by mouth every 7 (seven) days. 30 capsule 3  . zolmitriptan (ZOMIG ZMT) 2.5 MG disintegrating tablet Take 1 tablet (2.5 mg total) by mouth as needed for migraine. 10 tablet 1  . zolpidem (AMBIEN) 10 MG tablet Take 10 mg by mouth at bedtime.    . [DISCONTINUED] dexlansoprazole (DEXILANT) 60 MG capsule Take 60 mg by mouth daily.       No current facility-administered medications on file prior to visit.    Allergies:  Allergies  Allergen Reactions  . Trazodone Hcl Other (See Comments)    REACTION: bad headaches  . Penicillins     Nausea      Review of Systems:  CONSTITUTIONAL: No fevers, chills, night sweats, or weight loss.   EYES: No visual changes or eye pain ENT: No hearing changes.  No history of nose bleeds.   RESPIRATORY: No cough, wheezing and shortness of breath.   CARDIOVASCULAR: Negative for chest pain, and palpitations.   GI: Negative for abdominal discomfort, blood in stools or black stools.  No recent change in bowel habits.   GU:  No history of incontinence.   MUSCLOSKELETAL: No history of joint pain or swelling.  No myalgias.   SKIN: Negative for lesions, rash, and itching.   ENDOCRINE: Negative for cold or heat intolerance, polydipsia or goiter.   PSYCH:  No depression or anxiety symptoms.   NEURO: As Above.   Vital Signs:  BP 130/78 mmHg  Ht _0  (1.6 m)  Wt 171 lb 8 oz (77.792 kg)  BMI 30.39 kg/m2  LMP 07/02/1994  Neurological Exam: MENTAL STATUS including orientation to time, place, person is normal.  Speech is not dysarthric.  CRANIAL NERVES:  Pupils equal round and reactive to light. Face is symmetric. Palate elevates symmetrically.  Tongue is midline.  MOTOR:  Motor strength is  5/5 in all extremities.    MSRs:  Reflexes are 2+/4 throughout  COORDINATION/GAIT:  Gait narrow based and stable. Tandem gait is unsteady.  Data: Labs 11/06/2013:  ESR 15, vitamin B12 241, TSH 1.9, ferritin 37  EEG 11/14/2013: This awake and asleep EEG is within normal limits.  MRI cervical spine wo contrast 05/14/2008: Cervical disc degeneration and spondylosis as above. No change from 2005.   MRI brain wo contrast 03/17/2004:  Unremarkable MRI of the brain. No acute findings. benign arachnoid granulations (giant pacchionian granulations), and I feel that this finding represents a normal anatomic variant as opposed to pathology. At this point, I cannot make the  diagnosis of a pathologic lesion such as an eosinophilic granuloma. Again I favor that this represents a benign process, i.e. giant arachnoid granulations.   MRI/A brain and MRA neck 12/05/2013: 1. Stable and negative MRI appearance of the brain; inadvertently  diffusion-weighted imaging was not performed. Attempts will be made to brain the patient back for diffusion weighted imaging, and an addendum will be dictated.  2. Negative MRA head and neck.    IMPRESSION/PLAN: 1. Periodic limb movements of sleep - infrequent and stable   - Less likely RLS  - Continue iron supplements 334m daily   2. Episodic migraine   - Headaches days ~3-5/month  - Continue zomig 2.518mas needed for acute migraine  3. Lightheadedness/dizziness causing gait instability, intermittent  - MRI/A brain and MRA neck is normal  - Encouraged to drink water  - Patient interested in starting balance therapy  4.  Low vitamin B12  - Continue vitamin B12 100053mdaily  Return to clinic in 9 months  The duration of this appointment visit was 25 minutes of face-to-face time with the patient.  Greater than 50% of this time was spent in counseling, explanation of diagnosis, planning of further management, and coordination of care.   Thank you for allowing me to  participate in patient's care.  If I can answer any additional questions, I would be pleased to do so.    Sincerely,    Donika K. PatPosey ProntoO

## 2015-01-20 ENCOUNTER — Ambulatory Visit (INDEPENDENT_AMBULATORY_CARE_PROVIDER_SITE_OTHER): Payer: Medicare Other | Admitting: Nurse Practitioner

## 2015-01-20 ENCOUNTER — Encounter: Payer: Self-pay | Admitting: Nurse Practitioner

## 2015-01-20 ENCOUNTER — Ambulatory Visit: Payer: Medicare Other | Admitting: Nurse Practitioner

## 2015-01-20 VITALS — BP 128/80 | HR 88 | Resp 16 | Ht 64.0 in | Wt 172.0 lb

## 2015-01-20 DIAGNOSIS — M545 Low back pain, unspecified: Secondary | ICD-10-CM

## 2015-01-20 DIAGNOSIS — N952 Postmenopausal atrophic vaginitis: Secondary | ICD-10-CM | POA: Diagnosis not present

## 2015-01-20 DIAGNOSIS — Z8709 Personal history of other diseases of the respiratory system: Secondary | ICD-10-CM

## 2015-01-20 DIAGNOSIS — G8929 Other chronic pain: Secondary | ICD-10-CM

## 2015-01-20 DIAGNOSIS — I10 Essential (primary) hypertension: Secondary | ICD-10-CM

## 2015-01-20 DIAGNOSIS — Z124 Encounter for screening for malignant neoplasm of cervix: Secondary | ICD-10-CM

## 2015-01-20 DIAGNOSIS — M8580 Other specified disorders of bone density and structure, unspecified site: Secondary | ICD-10-CM

## 2015-01-20 DIAGNOSIS — Z01419 Encounter for gynecological examination (general) (routine) without abnormal findings: Secondary | ICD-10-CM | POA: Diagnosis not present

## 2015-01-20 MED ORDER — ESTROGENS, CONJUGATED 0.625 MG/GM VA CREA: 1.0000 | TOPICAL_CREAM | Freq: Every day | VAGINAL | Status: DC

## 2015-01-20 NOTE — Progress Notes (Signed)
64 y.o. G32P2002 Married  Caucasian Fe here for annual exam.  No new health problems.  Likes retirement.  Mother is now 37 and patient takes care of her apt and makes sure she is doing well.    Patient's last menstrual period was 07/02/1994.          Sexually active: Yes.    The current method of family planning is status post hysterectomy.    Exercising: No.  n/a Smoker:  no  Health Maintenance: Pap:  11/23/01 wnl MMG:  11/13/2013 Bi-rads C 1 neg will schedule Colonoscopy:  03/11/09, tubular adenoma, repeat 5 years BMD:   04/11/14 right femoral neck 0.827, T -1.5 TDaP:  11/20/2009 Shingles: not yet Labs: PCP   reports that she has never smoked. She has never used smokeless tobacco. She reports that she does not drink alcohol or use illicit drugs.  Past Medical History  Diagnosis Date  . Arthritis     osteo  . GERD (gastroesophageal reflux disease)   . Hyperlipidemia   . Hypertension   . Allergy   . Chronic gastritis   . Hemorrhoids   . Sleep disturbance   . Adenomatous colon polyp 10/1997, and 03/2009  . Menorrhagia     partial hysterectomy  . Migraines   . 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 Asthma     Past Surgical History  Procedure Laterality Date  . Vaginal hysterectomy  1998    secondary to prolapse, ovaries remain  . Tonsillectomy  age 31  . Turbinate sinus surgery  2003    Current Outpatient Prescriptions  Medication Sig Dispense Refill  . acetaminophen-codeine (TYLENOL #3) 300-30 MG per tablet     . albuterol (PROVENTIL HFA;VENTOLIN HFA) 108 (90 BASE) MCG/ACT inhaler Inhale 2 puffs into the lungs every 6 (six) hours as needed for wheezing or shortness of breath. 1 Inhaler 2  . candesartan (ATACAND) 16 MG tablet Take 16 mg by mouth at bedtime.     . cetirizine (ZYRTEC) 10 MG tablet Take 1 tablet (10 mg total) by mouth daily. One tab daily for allergies 30 tablet 1  . fluticasone (FLONASE) 50 MCG/ACT nasal spray Place  1 spray into both nostrils daily.    . methocarbamol (ROBAXIN) 500 MG tablet     . mometasone (ASMANEX) 220 MCG/INH inhaler Inhale 2 puffs into the lungs daily.    . montelukast (SINGULAIR) 10 MG tablet Take 10 mg by mouth at bedtime.      Marland Kitchen omeprazole (PRILOSEC) 40 MG capsule Take 1 capsule (40 mg total) by mouth daily. 90 capsule 3  . polyethylene glycol powder (GLYCOLAX/MIRALAX) powder Take 17 g by mouth daily. 850 g 1  . pravastatin (PRAVACHOL) 20 MG tablet Take 20 mg by mouth daily.      . ranitidine (ZANTAC) 300 MG tablet Take 1 tablet (300 mg total) by mouth at bedtime. 30 tablet 2  . tiZANidine (ZANAFLEX) 4 MG tablet Take 1 tablet by mouth as needed.    . Vitamin D, Ergocalciferol, (DRISDOL) 50000 UNITS CAPS capsule Take 1 capsule (50,000 Units total) by mouth every 7 (seven) days. 30 capsule 3  . zolmitriptan (ZOMIG ZMT) 2.5 MG disintegrating tablet Take 1 tablet (2.5 mg total) by mouth as needed for migraine. 10 tablet 1  . zolpidem (AMBIEN) 10 MG tablet Take 10 mg by mouth at bedtime.    . conjugated estrogens (PREMARIN) vaginal cream Place 1 Applicatorful vaginally daily. Use  1/2 g vaginally twice a week 60 g 3  . [DISCONTINUED] dexlansoprazole (DEXILANT) 60 MG capsule Take 60 mg by mouth daily.       No current facility-administered medications for this visit.    Family History  Problem Relation Age of Onset  . Throat cancer Father   . Coronary artery disease Father   . Heart attack Father 25  . Stroke Father     during procedure  . Lung cancer Father 1  . Coronary artery disease Mother   . Heart failure Mother   . Hypertension Mother   . Hyperlipidemia Mother   . Diabetes Paternal Aunt     x 2  . Diabetes Paternal Grandmother   . Coronary artery disease Paternal Grandmother   . Stroke Maternal Grandmother     or MI  . Prostate cancer Paternal Uncle   . Cancer Brother   . Heart disease Brother     ROS:  Pertinent items are noted in HPI.  Otherwise, a  comprehensive ROS was negative.  Exam:   BP 128/80 mmHg  Pulse 88  Resp 16  Ht 5\' 4"  (1.626 m)  Wt 172 lb (78.019 kg)  BMI 29.51 kg/m2  LMP 07/02/1994 Height: 5\' 4"  (162.6 cm) Ht Readings from Last 3 Encounters:  01/20/15 5\' 4"  (1.626 m)  01/16/15 5\' 3"  (1.6 m)  07/15/14 5\' 4"  (1.626 m)    General appearance: alert, cooperative and appears stated age Head: Normocephalic, without obvious abnormality, atraumatic Neck: no adenopathy, supple, symmetrical, trachea midline and thyroid normal to inspection and palpation Lungs: clear to auscultation bilaterally Breasts: normal appearance, no masses or tenderness Heart: regular rate and rhythm Abdomen: soft, non-tender; no masses,  no organomegaly Extremities: extremities normal, atraumatic, no cyanosis or edema Skin: Skin color, texture, turgor normal. No rashes or lesions Lymph nodes: Cervical, supraclavicular, and axillary nodes normal. No abnormal inguinal nodes palpated Neurologic: Grossly normal   Pelvic: External genitalia:  no lesions              Urethra:  normal appearing urethra with no masses, tenderness or lesions              Bartholin's and Skene's: normal                 Vagina: normal appearing vagina with normal color and discharge, no lesions              Cervix: absent              Pap taken: No. Bimanual Exam:  Uterus:  uterus absent              Adnexa: no mass, fullness, tenderness               Rectovaginal: Confirms               Anus:  normal sphincter tone, no lesions  Chaperone present: no  A:  Well Woman with normal exam  S/P TVH secondary to prolapse, ovaries remain 1996  Vit D deficiency  History of fibromyalgia, GERD, back pain  History of HSV - genital  Atrophic vaginitis Flare of asthma this spring- better now   P:   Reviewed health and wellness pertinent to exam  Pap smear as above  Mammogram is due now and is  scheduled  Change Vagifem to Premarin vaginal cream secondary to cost  Counseled on breast self exam, mammography screening, adequate intake of calcium and vitamin D, diet and exercise,  Kegel's exercises return annually or prn  An After Visit Summary was printed and given to the patient.

## 2015-01-20 NOTE — Patient Instructions (Addendum)

## 2015-01-21 ENCOUNTER — Emergency Department (HOSPITAL_COMMUNITY)
Admission: EM | Admit: 2015-01-21 | Discharge: 2015-01-21 | Disposition: A | Discharge status: Home / Self Care | Payer: Medicare Other | Attending: Physician Assistant | Admitting: Physician Assistant

## 2015-01-21 ENCOUNTER — Encounter (HOSPITAL_COMMUNITY): Payer: Self-pay | Admitting: Emergency Medicine

## 2015-01-21 ENCOUNTER — Emergency Department (HOSPITAL_COMMUNITY): Payer: Medicare Other

## 2015-01-21 DIAGNOSIS — R002 Palpitations: Secondary | ICD-10-CM | POA: Diagnosis not present

## 2015-01-21 DIAGNOSIS — Z880 Allergy status to penicillin: Secondary | ICD-10-CM | POA: Diagnosis not present

## 2015-01-21 DIAGNOSIS — E785 Hyperlipidemia, unspecified: Secondary | ICD-10-CM | POA: Diagnosis not present

## 2015-01-21 DIAGNOSIS — Z79899 Other long term (current) drug therapy: Secondary | ICD-10-CM | POA: Insufficient documentation

## 2015-01-21 DIAGNOSIS — R42 Dizziness and giddiness: Secondary | ICD-10-CM | POA: Diagnosis present

## 2015-01-21 DIAGNOSIS — J45901 Unspecified asthma with (acute) exacerbation: Secondary | ICD-10-CM | POA: Diagnosis not present

## 2015-01-21 DIAGNOSIS — Z86018 Personal history of other benign neoplasm: Secondary | ICD-10-CM | POA: Diagnosis not present

## 2015-01-21 DIAGNOSIS — Z8742 Personal history of other diseases of the female genital tract: Secondary | ICD-10-CM | POA: Insufficient documentation

## 2015-01-21 DIAGNOSIS — Z7951 Long term (current) use of inhaled steroids: Secondary | ICD-10-CM | POA: Insufficient documentation

## 2015-01-21 DIAGNOSIS — Z8619 Personal history of other infectious and parasitic diseases: Secondary | ICD-10-CM | POA: Diagnosis not present

## 2015-01-21 DIAGNOSIS — I10 Essential (primary) hypertension: Secondary | ICD-10-CM | POA: Diagnosis not present

## 2015-01-21 DIAGNOSIS — G43909 Migraine, unspecified, not intractable, without status migrainosus: Secondary | ICD-10-CM | POA: Insufficient documentation

## 2015-01-21 DIAGNOSIS — M1990 Unspecified osteoarthritis, unspecified site: Secondary | ICD-10-CM | POA: Diagnosis not present

## 2015-01-21 DIAGNOSIS — K219 Gastro-esophageal reflux disease without esophagitis: Secondary | ICD-10-CM | POA: Insufficient documentation

## 2015-01-21 LAB — BASIC METABOLIC PANEL
Anion gap: 8 (ref 5–15)
BUN: 17 mg/dL (ref 6–20)
CO2: 26 mmol/L (ref 22–32)
CREATININE: 0.73 mg/dL (ref 0.44–1.00)
Calcium: 9.4 mg/dL (ref 8.9–10.3)
Chloride: 105 mmol/L (ref 101–111)
GFR calc Af Amer: >60 mL/min (ref >60)
GFR calc non Af Amer: >60 mL/min (ref >60)
Glucose, Bld: 100 mg/dL — ABNORMAL HIGH (ref 65–99)
Potassium: 4 mmol/L (ref 3.5–5.1)
SODIUM: 139 mmol/L (ref 135–145)

## 2015-01-21 LAB — URINALYSIS, ROUTINE W REFLEX MICROSCOPIC
BILIRUBIN URINE: NEGATIVE
GLUCOSE, UA: NEGATIVE mg/dL
Hgb urine dipstick: NEGATIVE
Ketones, ur: NEGATIVE mg/dL
Leukocytes, UA: NEGATIVE
Nitrite: NEGATIVE
PH: 6.5 (ref 5.0–8.0)
Protein, ur: NEGATIVE mg/dL
Specific Gravity, Urine: 1.016 (ref 1.005–1.030)
Urobilinogen, UA: 0.2 mg/dL (ref 0.0–1.0)

## 2015-01-21 LAB — CBC
HCT: 40.3 % (ref 36.0–46.0)
Hemoglobin: 13.9 g/dL (ref 12.0–15.0)
MCH: 30.8 pg (ref 26.0–34.0)
MCHC: 34.5 g/dL (ref 30.0–36.0)
MCV: 89.2 fL (ref 78.0–100.0)
Platelets: 332 10*3/uL (ref 150–400)
RBC: 4.52 MIL/uL (ref 3.87–5.11)
RDW: 12.8 % (ref 11.5–15.5)
WBC: 4 10*3/uL (ref 4.0–10.5)

## 2015-01-21 LAB — I-STAT TROPONIN, ED: Troponin i, poc: 0 ng/mL (ref 0.00–0.08)

## 2015-01-21 MED ORDER — GI COCKTAIL ~~LOC~~
30.0000 mL | Freq: Once | ORAL | Status: AC
  Administered 2015-01-21: 30 mL via ORAL
  Filled 2015-01-21: qty 30

## 2015-01-21 NOTE — Discharge Instructions (Signed)
Follow-up with her regular physician. Youmay need a Holter monitor.   Holter Monitoring A Holter monitor is a small device with electrodes (small sticky patches) that attach to your chest. It records the electrical activity of your heart and is worn continuously for 24-48 hours.  A HOLTER MONITOR IS USED TO  Detect heart problems such as:  Heart arrhythmia. Is an abnormal or irregular heartbeat. With some heart arrhythmias, you may not feel or know that you have an irregular heart rhythm.  Palpitations, such as feeling your heart racing or fluttering. It is possible to have heart palpitations and not have a heart arrhythmia.  A heart rhythm that is too slow or too fast.  If you have problems fainting, near fainting or feeling light-headed, a Holter monitor may be worn to see if your heart is the cause. HOLTER MONITOR PREPARATION   Electrodes will be attached to the skin on your chest.  If you have hair on your chest, small areas may have to be shaved. This is done to help the patches stick better and make the recording more accurate.  The electrodes are attached by wires to the Holter monitor. The Holter monitor clips to your clothing. You will wear the monitor at all times, even while exercising and sleeping. HOME CARE INSTRUCTIONS   Wear your monitor at all times.  The wires and the monitor must stay dry. Do not get the monitor wet.  Do not bathe, swim or use a hot tub with it on.  You may do a "sponge" bath while you have the monitor on.  Keep your skin clean, do not put body lotion or moisturizer on your chest.  It's possible that your skin under the electrodes could become irritated. To keep this from happening, you may put the electrodes in slightly different places on your chest.  Your caregiver will also ask you to keep a diary of your activities, such as walking or doing chores. Be sure to note what you are doing if you experience heart symptoms such as palpitations.  This will help your caregiver determine what might be contributing to your symptoms. The information stored in your monitor will be reviewed by your caregiver alongside your diary entries.  Make sure the monitor is safely clipped to your clothing or in a location close to your body that your caregiver recommends.  The monitor and electrodes are removed when the test is over. Return the monitor as directed.  Be sure to follow up with your caregiver and discuss your Holter monitor results. SEEK IMMEDIATE MEDICAL CARE IF:  You faint or feel lightheaded.  You have trouble breathing.  You get pain in your chest, upper arm or jaw.  You feel sick to your stomach and your skin is pale, cool, or damp.  You think something is wrong with the way your heart is beating. MAKE SURE YOU:   Understand these instructions.  Will watch your condition.  Will get help right away if you are not doing well or get worse. Document Released: 01/16/2004 Document Revised: 07/12/2011 Document Reviewed: 05/30/2008 [facility] Patient Information 2015 [place], [place]. This information is not intended to replace advice given to you by your health care provider. Make sure you discuss any questions you have with your health care provider.

## 2015-01-21 NOTE — ED Notes (Signed)
Per pt, states she has been having some dizziness on and off for about a week-states she has occasional palpitations history of asthma and gerd

## 2015-01-21 NOTE — ED Provider Notes (Signed)
CSN: [id number]     Arrival date & time 01/21/15  1114 History   First MD Initiated Contact with Patient 01/21/15 1221     Chief Complaint  Patient presents with  . Dizziness     (Consider location/radiation/quality/duration/timing/severity/associated sxs/prior Treatment) HPI   Patient is a 64 year old female with GERD presenting with occasional palpitations, occasional shortness of breath, and occasional dizziness for the last several weeks. Patient called her primary care provider who had her come to the emergency department.  She states that occasionally throughout her day she'll have feeling of palpitations and that all go away. Husband states that she has complained of occasional dizziness. She can't member when or where this would happen. She can't remember any inciting factors or any patterns about it.  Patient has had multiple Holter monitors in the past.  No risk factors for PE.  Past Medical History  Diagnosis Date  . Arthritis     osteo  . GERD (gastroesophageal reflux disease)   . Hyperlipidemia   . Hypertension   . Allergy   . Chronic gastritis   . Hemorrhoids   . Sleep disturbance   . Adenomatous colon polyp 10/1997, and 03/2009  . Menorrhagia     partial hysterectomy  . Migraines   . 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 Asthma    Past Surgical History  Procedure Laterality Date  . Vaginal hysterectomy  1998    secondary to prolapse, ovaries remain  . Tonsillectomy  age 3  . Turbinate sinus surgery  2003   Family History  Problem Relation Age of Onset  . Throat cancer Father   . Coronary artery disease Father   . Heart attack Father 35  . Stroke Father     during procedure  . Lung cancer Father 33  . Coronary artery disease Mother   . Heart failure Mother   . Hypertension Mother   . Hyperlipidemia Mother   . Diabetes Paternal Aunt     x 2  . Diabetes Paternal Grandmother   . Coronary artery disease  Paternal Grandmother   . Stroke Maternal Grandmother     or MI  . Prostate cancer Paternal Uncle   . Cancer Brother   . Heart disease Brother    Social History  Substance Use Topics  . Smoking status: Never Smoker   . Smokeless tobacco: Never Used  . Alcohol Use: No   OB History    Gravida Para Term Preterm AB TAB SAB Ectopic Multiple Living   2 2 2       2      Review of Systems  Constitutional: Negative for activity change and fatigue.  HENT: Negative for congestion and drooling.   Eyes: Negative for discharge.  Respiratory: Positive for shortness of breath. Negative for cough and chest tightness.   Cardiovascular: Positive for palpitations. Negative for chest pain and leg swelling.  Gastrointestinal: Negative for abdominal distention.  Genitourinary: Negative for dysuria and difficulty urinating.  Musculoskeletal: Negative for joint swelling.  Skin: Negative for rash.  Allergic/Immunologic: Negative for immunocompromised state.  Neurological: Positive for dizziness. Negative for speech difficulty and weakness.  Psychiatric/Behavioral: Negative for behavioral problems and agitation.      Allergies  Trazodone hcl and Penicillins  Home Medications   Prior to Admission medications   Medication Sig Start Date End Date Taking? Authorizing Provider  albuterol (PROVENTIL HFA;VENTOLIN HFA) 108 (90 BASE) MCG/ACT inhaler Inhale 2 puffs into  the lungs every 6 (six) hours as needed for wheezing or shortness of breath. 08/01/13  Yes Gregor Hams, MD  Ascorbic Acid (VITAMIN C PO) Take 1 tablet by mouth daily.   Yes Historical Provider, MD  CALCIUM PO Take 1 tablet by mouth daily.   Yes Historical Provider, MD  candesartan (ATACAND) 16 MG tablet Take 16 mg by mouth at bedtime.    Yes Historical Provider, MD  cetirizine (ZYRTEC) 10 MG tablet Take 1 tablet (10 mg total) by mouth daily. One tab daily for allergies 08/07/13  Yes [name], MD  Cyanocobalamin (VITAMIN B-12 PO) Take 1  tablet by mouth daily.   Yes Historical Provider, MD  fluticasone (FLONASE) 50 MCG/ACT nasal spray Place 1 spray into both nostrils daily.   Yes Historical Provider, MD  methocarbamol (ROBAXIN) 500 MG tablet Take 500 mg by mouth daily as needed for muscle spasms.  05/28/14  Yes Historical Provider, MD  mometasone (ASMANEX) 220 MCG/INH inhaler Inhale 1 puff into the lungs 2 (two) times daily.    Yes Historical Provider, MD  montelukast (SINGULAIR) 10 MG tablet Take 10 mg by mouth daily with breakfast.    Yes Historical Provider, MD  nabumetone (RELAFEN) 500 MG tablet Take 500 mg by mouth 2 (two) times daily as needed for mild pain.   Yes Historical Provider, MD  omeprazole (PRILOSEC) 40 MG capsule Take 1 capsule (40 mg total) by mouth daily. 02/25/14  Yes Ladene Artist, MD  Pediatric Multiple Vitamins (MULTIVITAMIN) LIQD Take 5 mLs by mouth daily.   Yes Historical Provider, MD  pravastatin (PRAVACHOL) 20 MG tablet Take 20 mg by mouth daily.     Yes Historical Provider, MD  ranitidine (ZANTAC) 300 MG tablet Take 1 tablet (300 mg total) by mouth at bedtime. 02/25/14  Yes Ladene Artist, MD  VITAMIN A PO Take 1 tablet by mouth daily.   Yes Historical Provider, MD  Vitamin D, Ergocalciferol, (DRISDOL) 50000 UNITS CAPS capsule Take 50,000 Units by mouth every Sunday.   Yes Historical Provider, MD  zolmitriptan (ZOMIG ZMT) 2.5 MG disintegrating tablet Take 1 tablet (2.5 mg total) by mouth as needed for migraine. 03/28/13  Yes Harden Mo, MD  zolpidem (AMBIEN) 5 MG tablet Take 5 mg by mouth at bedtime as needed for sleep.   Yes Historical Provider, MD  conjugated estrogens (PREMARIN) vaginal cream Place 1 Applicatorful vaginally daily. Use 1/2 g vaginally twice a week Patient not taking: Reported on 01/21/2015 01/20/15   Kem Boroughs, FNP  polyethylene glycol powder (GLYCOLAX/MIRALAX) powder Take 17 g by mouth daily. Patient not taking: Reported on 01/21/2015 08/01/13   Gregor Hams, MD  Vitamin D,  Ergocalciferol, (DRISDOL) 50000 UNITS CAPS capsule Take 1 capsule (50,000 Units total) by mouth every 7 (seven) days. Patient not taking: Reported on 01/21/2015 01/15/14   Kem Boroughs, FNP   BP 136/72 mmHg  Pulse 81  Temp(Src) 98.6 F (37 C)  Resp 18  SpO2 97%  LMP 07/02/1994 Physical Exam  Constitutional: She is oriented to person, place, and time. She appears well-developed and well-nourished.  HENT:  Head: Normocephalic and atraumatic.  Eyes: Conjunctivae are normal. Right eye exhibits no discharge.  Neck: Neck supple.  Cardiovascular: Normal rate, regular rhythm and normal heart sounds.   No murmur heard. Pulmonary/Chest: Effort normal and breath sounds normal. She has no wheezes. She has no rales.  Abdominal: Soft. She exhibits no distension. There is no tenderness.  Musculoskeletal: Normal range of  motion. She exhibits no edema.  Neurological: She is oriented to person, place, and time. No cranial nerve deficit.  No nystagmus or dizziness.  Skin: Skin is warm and dry. No rash noted. She is not diaphoretic.  Psychiatric: She has a normal mood and affect. Her behavior is normal.  Nursing note and vitals reviewed.   ED Course  Procedures (including critical care time) Labs Review Labs Reviewed  CBC  BASIC METABOLIC PANEL  URINALYSIS, ROUTINE W REFLEX MICROSCOPIC (NOT AT [facility])  I-STAT TROPOININ, ED    Imaging Review No results found. I have personally reviewed and evaluated these images and lab results as part of my medical decision-making.   EKG Interpretation None      I read patient's EKG in [facility], shows normal sinus rhythm no prolongations of any intervals. No acute ischemia.  MDM   Final diagnoses:  None    Patient is a 64 year old female presenting with occasional palpitations for the last 3 weeks. Patient's had this in the past and had negative Holter monitor studies. Patient thinks it may be her GERD. We will give her GI cocktail now. We will do a  single troponin given the length of time that she's had this palpitations. EKG is normal. No palpitations now. No dizziness. Normal heart rate. We will have her follow-up with her primary care doctor for additional studies including Holter monitor as needed.  Courteney Julio Alm, MD 01/21/15 1247

## 2015-01-23 DIAGNOSIS — H2513 Age-related nuclear cataract, bilateral: Secondary | ICD-10-CM | POA: Diagnosis not present

## 2015-01-23 DIAGNOSIS — H04123 Dry eye syndrome of bilateral lacrimal glands: Secondary | ICD-10-CM | POA: Diagnosis not present

## 2015-01-23 DIAGNOSIS — H43393 Other vitreous opacities, bilateral: Secondary | ICD-10-CM | POA: Diagnosis not present

## 2015-01-23 DIAGNOSIS — H40013 Open angle with borderline findings, low risk, bilateral: Secondary | ICD-10-CM | POA: Diagnosis not present

## 2015-01-23 DIAGNOSIS — H2512 Age-related nuclear cataract, left eye: Secondary | ICD-10-CM | POA: Diagnosis not present

## 2015-01-26 NOTE — Progress Notes (Signed)
Encounter reviewed by Dr. Brook Amundson C. Silva.  

## 2015-01-30 DIAGNOSIS — M67441 Ganglion, right hand: Secondary | ICD-10-CM | POA: Diagnosis not present

## 2015-01-31 DIAGNOSIS — Z1231 Encounter for screening mammogram for malignant neoplasm of breast: Secondary | ICD-10-CM | POA: Diagnosis not present

## 2015-02-04 DIAGNOSIS — H2512 Age-related nuclear cataract, left eye: Secondary | ICD-10-CM | POA: Diagnosis not present

## 2015-02-24 DIAGNOSIS — H2511 Age-related nuclear cataract, right eye: Secondary | ICD-10-CM | POA: Diagnosis not present

## 2015-02-24 DIAGNOSIS — H25011 Cortical age-related cataract, right eye: Secondary | ICD-10-CM | POA: Diagnosis not present

## 2015-02-27 ENCOUNTER — Other Ambulatory Visit: Payer: Self-pay | Admitting: Allergy and Immunology

## 2015-02-27 MED ORDER — RANITIDINE HCL 300 MG PO TABS: 300.0000 mg | ORAL_TABLET | Freq: Every day | ORAL | Status: DC

## 2015-02-27 MED ORDER — FLUTICASONE PROPIONATE 50 MCG/ACT NA SUSP: 2.0000 | Freq: Every day | NASAL | Status: DC

## 2015-03-03 ENCOUNTER — Telehealth: Payer: Self-pay

## 2015-03-03 MED ORDER — ALBUTEROL SULFATE HFA 108 (90 BASE) MCG/ACT IN AERS: 2.0000 | INHALATION_SPRAY | Freq: Four times a day (QID) | RESPIRATORY_TRACT | Status: DC | PRN

## 2015-03-03 NOTE — Telephone Encounter (Signed)
Patient called to get a refill on her [employer], she called the pharmacy and the prescription had expired.DOL VISIT 12/03/2014   Please Advise   Thanks

## 2015-03-03 NOTE — Telephone Encounter (Signed)
Spoke with patient will send in [place] to [facility] with one refill.

## 2015-03-04 DIAGNOSIS — H25011 Cortical age-related cataract, right eye: Secondary | ICD-10-CM | POA: Diagnosis not present

## 2015-03-04 DIAGNOSIS — H2511 Age-related nuclear cataract, right eye: Secondary | ICD-10-CM | POA: Diagnosis not present

## 2015-03-04 DIAGNOSIS — H538 Other visual disturbances: Secondary | ICD-10-CM | POA: Diagnosis not present

## 2015-03-10 ENCOUNTER — Other Ambulatory Visit: Payer: Self-pay | Admitting: *Deleted

## 2015-03-10 MED ORDER — MONTELUKAST SODIUM 10 MG PO TABS
10.0000 mg | ORAL_TABLET | Freq: Every day | ORAL | Status: DC
Start: 2015-03-10 — End: 2015-11-07

## 2015-04-09 ENCOUNTER — Ambulatory Visit (INDEPENDENT_AMBULATORY_CARE_PROVIDER_SITE_OTHER): Payer: Medicare Other | Admitting: Allergy and Immunology

## 2015-04-09 ENCOUNTER — Encounter: Payer: Self-pay | Admitting: Allergy and Immunology

## 2015-04-09 VITALS — BP 120/80 | HR 80 | Temp 98.0°F | Resp 16

## 2015-04-09 DIAGNOSIS — J309 Allergic rhinitis, unspecified: Secondary | ICD-10-CM | POA: Diagnosis not present

## 2015-04-09 DIAGNOSIS — H1010 Acute atopic conjunctivitis, unspecified eye: Secondary | ICD-10-CM

## 2015-04-09 DIAGNOSIS — J387 Other diseases of larynx: Secondary | ICD-10-CM | POA: Diagnosis not present

## 2015-04-09 DIAGNOSIS — J4530 Mild persistent asthma, uncomplicated: Secondary | ICD-10-CM | POA: Diagnosis not present

## 2015-04-09 DIAGNOSIS — Z23 Encounter for immunization: Secondary | ICD-10-CM | POA: Diagnosis not present

## 2015-04-09 DIAGNOSIS — K219 Gastro-esophageal reflux disease without esophagitis: Secondary | ICD-10-CM | POA: Insufficient documentation

## 2015-04-09 NOTE — Progress Notes (Signed)
[facility]  Follow-up Note  Refering Provider: Ronnell Deleon* Primary Provider: Lottie Dawson, MD  Subjective:   Christina Deleon is a 64 y.o. female who returns [facility] in re-evaluation of the following:  HPI Comments:  Christina Deleon returns to this clinic on 04/09/2015 in reevaluation of her asthma and allergic rhinoconjunctivitis and LPR. I been treating her with omeprazole 40 g in the morning and ranitidine 300 mg in the evening and this is resulting in very good control of her throat issue and reflux. She's also making it an effort to replace her constant throat clearing with swallowing maneuver which appears to be going quite well. Her asthma is been under excellent control using Asmanex and her requirement for bronchodilator is less than 1 time per week and she has not required any systemic steroids to treat asthma since I've seen her in this clinic 6 months ago. Her nose is doing quite well while using Flonase.   Outpatient Encounter Prescriptions as of 04/09/2015  Medication Sig  . albuterol (PROAIR HFA) 108 (90 BASE) MCG/ACT inhaler Inhale 2 puffs into the lungs every 6 (six) hours as needed for wheezing or shortness of breath.  . Ascorbic Acid (VITAMIN C PO) Take 1 tablet by mouth daily.  Marland Kitchen CALCIUM PO Take 1 tablet by mouth daily.  . candesartan (ATACAND) 16 MG tablet Take 16 mg by mouth at bedtime.   . cetirizine (ZYRTEC) 10 MG tablet Take 1 tablet (10 mg total) by mouth daily. One tab daily for allergies  . conjugated estrogens (PREMARIN) vaginal cream Place 1 Applicatorful vaginally daily. Use 1/2 g vaginally twice a week  . Cyanocobalamin (VITAMIN B-12 PO) Take 1 tablet by mouth daily.  . cyclopentolate (CYCLODRYL,CYCLOGYL) 1 % ophthalmic solution   . fluticasone (FLONASE) 50 MCG/ACT nasal spray Place 2 sprays into both nostrils daily.  Marland Kitchen ketorolac (ACULAR) 0.5 % ophthalmic  solution   . methocarbamol (ROBAXIN) 500 MG tablet Take 500 mg by mouth daily as needed for muscle spasms.   . mometasone (ASMANEX) 220 MCG/INH inhaler Inhale 1 puff into the lungs 2 (two) times daily.   . montelukast (SINGULAIR) 10 MG tablet Take 1 tablet (10 mg total) by mouth daily with breakfast.  . nabumetone (RELAFEN) 500 MG tablet Take 500 mg by mouth 2 (two) times daily as needed for mild pain.  Marland Kitchen ofloxacin (OCUFLOX) 0.3 % ophthalmic solution   . omeprazole (PRILOSEC) 40 MG capsule Take 1 capsule (40 mg total) by mouth daily.  . Pediatric Multiple Vitamins (MULTIVITAMIN) LIQD Take 5 mLs by mouth daily.  . polyethylene glycol powder (GLYCOLAX/MIRALAX) powder Take 17 g by mouth daily.  . pravastatin (PRAVACHOL) 20 MG tablet Take 20 mg by mouth daily.    . ranitidine (ZANTAC) 300 MG tablet Take 1 tablet (300 mg total) by mouth at bedtime.  Marland Kitchen VITAMIN A PO Take 1 tablet by mouth daily.  . Vitamin D, Ergocalciferol, (DRISDOL) 50000 UNITS CAPS capsule Take 1 capsule (50,000 Units total) by mouth every 7 (seven) days.  . Vitamin D, Ergocalciferol, (DRISDOL) 50000 UNITS CAPS capsule Take 50,000 Units by mouth every Sunday.  . zolmitriptan (ZOMIG ZMT) 2.5 MG disintegrating tablet Take 1 tablet (2.5 mg total) by mouth as needed for migraine.  Marland Kitchen zolpidem (AMBIEN) 5 MG tablet Take 5 mg by mouth at bedtime as needed for sleep.  Marland Kitchen albuterol (PROVENTIL HFA;VENTOLIN HFA) 108 (90 BASE) MCG/ACT inhaler Inhale  2 puffs into the lungs every 6 (six) hours as needed for wheezing or shortness of breath. (Patient not taking: Reported on 04/09/2015)   No facility-administered encounter medications on file as of 04/09/2015.    No orders of the defined types were placed in this encounter.    Past Medical History  Diagnosis Date  . Arthritis     osteo  . GERD (gastroesophageal reflux disease)   . Hyperlipidemia   . Hypertension   . Allergy   . Chronic gastritis   . Hemorrhoids   . Sleep disturbance   .  Adenomatous colon polyp 10/1997, and 03/2009  . Menorrhagia     partial hysterectomy  . Migraines   . 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 Asthma     Past Surgical History  Procedure Laterality Date  . Vaginal hysterectomy  1998    secondary to prolapse, ovaries remain  . Tonsillectomy  age 7  . Turbinate sinus surgery  2003    Allergies  Allergen Reactions  . Trazodone Hcl Other (See Comments)    REACTION: bad headaches  . Penicillins Nausea Only    Has patient had a PCN reaction causing immediate rash, facial/tongue/throat swelling, SOB or lightheadedness with hypotension: No Has patient had a PCN reaction causing severe rash involving mucus membranes or skin necrosis: No Has patient had a PCN reaction that required hospitalization No Has patient had a PCN reaction occurring within the last 10 years: No If all of the above answers are "NO", then may proceed with Cephalosporin use.     Review of Systems  Constitutional: Negative.   HENT: Negative.   Eyes: Negative.   Respiratory: Negative.   Cardiovascular: Negative.   Gastrointestinal: Negative.   Musculoskeletal: Negative.   Skin: Negative.   Hematological: Negative.      Objective:   Filed Vitals:   04/09/15 1029  BP: 120/80  Pulse: 80  Temp: 98 F (36.7 C)  Resp: 16          Physical Exam  Constitutional: She appears well-developed and well-nourished. No distress.  Slight throat clearing  HENT:  Head: Normocephalic and atraumatic. Head is without right periorbital erythema and without left periorbital erythema.  Right Ear: Tympanic membrane, external ear and ear canal normal. No drainage or tenderness. No foreign bodies. Tympanic membrane is not injected, not scarred, not perforated, not erythematous, not retracted and not bulging. No middle ear effusion.  Left Ear: Tympanic membrane, external ear and ear canal normal. No drainage or tenderness. No foreign  bodies. Tympanic membrane is not injected, not scarred, not perforated, not erythematous, not retracted and not bulging.  No middle ear effusion.  Nose: Nose normal. No mucosal edema, rhinorrhea, nose lacerations or sinus tenderness.  No foreign bodies.  Mouth/Throat: Oropharynx is clear and moist. No oropharyngeal exudate, posterior oropharyngeal edema, posterior oropharyngeal erythema or tonsillar abscesses.  Eyes: Lids are normal. Right eye exhibits no chemosis, no discharge and no exudate. No foreign body present in the right eye. Left eye exhibits no chemosis, no discharge and no exudate. No foreign body present in the left eye. Right conjunctiva is not injected. Left conjunctiva is not injected.  Neck: Neck supple. No tracheal tenderness present. No tracheal deviation and no edema present. No thyroid mass and no thyromegaly present.  Cardiovascular: Normal rate, regular rhythm, S1 normal and S2 normal.  Exam reveals no gallop.   No murmur heard. Pulmonary/Chest: No accessory muscle  usage or stridor. No respiratory distress. She has no wheezes. She has no rhonchi. She has no rales.  Abdominal: Soft.  Lymphadenopathy:       Head (right side): No tonsillar adenopathy present.       Head (left side): No tonsillar adenopathy present.    She has no cervical adenopathy.  Neurological: She is alert.  Skin: No rash noted. She is not diaphoretic.  Psychiatric: She has a normal mood and affect. Her behavior is normal.    Diagnostics:    Spirometry was performed and demonstrated an FEV1 of 1.84 at 93 % of predicted.  The patient had an Asthma Control Test with the following results:  .    Assessment and Plan:   1. Mild persistent asthma, uncomplicated   2. LPRD (laryngopharyngeal reflux disease)   3. Allergic rhinoconjunctivitis   4. Need for influenza vaccination      1.  Continue omeprazole 40 mg in the morning and ranitidine 300mg  in the evening  2. Continue Asmanex 220 one inhalation  two times per day  3. Continue flonase one spray each nostril 1-2 times per day  4. Continue ProAir HFA and Zyrtec as needed  5. Flu vaccine administered in the clinic today  6. Return to clinic in 6 months or earlier if problem  I will have Deserey continue to use her current medical therapy which as resulted in very good control of her respiratory tract symptoms. I will see her back in this clinic in 6 months or earlier if there is a problem.   Allena Katz, MD [place]

## 2015-04-09 NOTE — Patient Instructions (Signed)
  1.  Continue omeprazole 40 mg in the  Morning and ranitidine 300mg  in the evening  2. Continue Asmanex 220 one inhalation two times per day  3. Continue flonase one spray each nostril 1-2 times per day  4. Continue ProAir HFA and Zyrtec as needed  5. Flu vaccine demonstrated in the clinic today  6. Return to clinic in 6 months or earlier if problem

## 2015-04-15 DIAGNOSIS — M4302 Spondylolysis, cervical region: Secondary | ICD-10-CM | POA: Diagnosis not present

## 2015-04-15 DIAGNOSIS — M533 Sacrococcygeal disorders, not elsewhere classified: Secondary | ICD-10-CM | POA: Diagnosis not present

## 2015-04-15 DIAGNOSIS — M25561 Pain in right knee: Secondary | ICD-10-CM | POA: Diagnosis not present

## 2015-04-15 DIAGNOSIS — R5383 Other fatigue: Secondary | ICD-10-CM | POA: Diagnosis not present

## 2015-04-15 DIAGNOSIS — M25551 Pain in right hip: Secondary | ICD-10-CM | POA: Diagnosis not present

## 2015-04-15 DIAGNOSIS — M25562 Pain in left knee: Secondary | ICD-10-CM | POA: Diagnosis not present

## 2015-04-15 DIAGNOSIS — M797 Fibromyalgia: Secondary | ICD-10-CM | POA: Diagnosis not present

## 2015-04-30 DIAGNOSIS — J019 Acute sinusitis, unspecified: Secondary | ICD-10-CM | POA: Diagnosis not present

## 2015-05-28 DIAGNOSIS — J45901 Unspecified asthma with (acute) exacerbation: Secondary | ICD-10-CM | POA: Diagnosis not present

## 2015-05-28 DIAGNOSIS — J209 Acute bronchitis, unspecified: Secondary | ICD-10-CM | POA: Diagnosis not present

## 2015-05-28 DIAGNOSIS — J9801 Acute bronchospasm: Secondary | ICD-10-CM | POA: Diagnosis not present

## 2015-05-30 ENCOUNTER — Other Ambulatory Visit: Payer: Self-pay | Admitting: Neurology

## 2015-05-30 MED ORDER — RANITIDINE HCL 300 MG PO TABS: 300.0000 mg | ORAL_TABLET | Freq: Every day | ORAL | Status: DC

## 2015-06-16 DIAGNOSIS — M79641 Pain in right hand: Secondary | ICD-10-CM | POA: Diagnosis not present

## 2015-06-16 DIAGNOSIS — R5381 Other malaise: Secondary | ICD-10-CM | POA: Diagnosis not present

## 2015-06-16 DIAGNOSIS — M4302 Spondylolysis, cervical region: Secondary | ICD-10-CM | POA: Diagnosis not present

## 2015-06-17 DIAGNOSIS — I10 Essential (primary) hypertension: Secondary | ICD-10-CM | POA: Diagnosis not present

## 2015-06-17 DIAGNOSIS — M25572 Pain in left ankle and joints of left foot: Secondary | ICD-10-CM | POA: Diagnosis not present

## 2015-06-17 DIAGNOSIS — F411 Generalized anxiety disorder: Secondary | ICD-10-CM | POA: Diagnosis not present

## 2015-06-18 DIAGNOSIS — Z79899 Other long term (current) drug therapy: Secondary | ICD-10-CM | POA: Diagnosis not present

## 2015-06-18 DIAGNOSIS — M2550 Pain in unspecified joint: Secondary | ICD-10-CM | POA: Diagnosis not present

## 2015-06-18 DIAGNOSIS — E559 Vitamin D deficiency, unspecified: Secondary | ICD-10-CM | POA: Diagnosis not present

## 2015-06-18 DIAGNOSIS — R5381 Other malaise: Secondary | ICD-10-CM | POA: Diagnosis not present

## 2015-06-25 ENCOUNTER — Telehealth: Payer: Self-pay | Admitting: Gastroenterology

## 2015-06-25 MED ORDER — OMEPRAZOLE 40 MG PO CPDR: 40.0000 mg | DELAYED_RELEASE_CAPSULE | Freq: Every day | ORAL | Status: DC

## 2015-06-25 NOTE — Telephone Encounter (Signed)
Prescription sent to patient's pharmacy for one refill and told to keep appt for any further refills.

## 2015-07-02 ENCOUNTER — Ambulatory Visit (INDEPENDENT_AMBULATORY_CARE_PROVIDER_SITE_OTHER): Payer: Medicare Other | Admitting: Gastroenterology

## 2015-07-02 ENCOUNTER — Encounter: Payer: Self-pay | Admitting: Gastroenterology

## 2015-07-02 VITALS — BP 122/70 | HR 84 | Ht 63.0 in | Wt 162.0 lb

## 2015-07-02 DIAGNOSIS — K5900 Constipation, unspecified: Secondary | ICD-10-CM | POA: Diagnosis not present

## 2015-07-02 DIAGNOSIS — K219 Gastro-esophageal reflux disease without esophagitis: Secondary | ICD-10-CM | POA: Diagnosis not present

## 2015-07-02 DIAGNOSIS — Z86010 Personal history of colonic polyps: Secondary | ICD-10-CM

## 2015-07-02 MED ORDER — OMEPRAZOLE 40 MG PO CPDR: 40.0000 mg | DELAYED_RELEASE_CAPSULE | Freq: Every day | ORAL | Status: DC

## 2015-07-02 MED ORDER — RANITIDINE HCL 300 MG PO TABS: 300.0000 mg | ORAL_TABLET | Freq: Every day | ORAL | Status: DC

## 2015-07-02 NOTE — Patient Instructions (Signed)
We have sent the following medications to your pharmacy for you to pick up at your convenience: omeprazole and ranitidine.   Thank you for choosing me and [facility].  Malcolm T. Stark, Jr., MD., FACG   

## 2015-07-02 NOTE — Progress Notes (Signed)
    History of Present Illness: This is a 65 year old female returning for follow-up of GERD. Her reflux symptoms are well-controlled on omeprazole every morning and ranitidine daily at bedtime. She ran out of medications a few weeks ago and her symptoms have been active since that time. She has mild chronic constipation that is well controlled. She has no other digestive complaints. Denies weight loss, abdominal pain, diarrhea, change in stool caliber, melena, hematochezia, nausea, vomiting, dysphagia, chest pain.   Current Medications, Allergies, Past Medical History, Past Surgical History, Family History and Social History were reviewed in [employer] record.  Physical Exam: General: Well developed, well nourished, no acute distress Head: Normocephalic and atraumatic Eyes:  sclerae anicteric, EOMI Ears: Normal auditory acuity Mouth: No deformity or lesions Lungs: Clear throughout to auscultation Heart: Regular rate and rhythm; no murmurs, rubs or bruits Abdomen: Soft, non tender and non distended. No masses, hepatosplenomegaly or hernias noted. Normal Bowel sounds Musculoskeletal: Symmetrical with no gross deformities  Extremities: No clubbing, cyanosis, edema or deformities noted Neurological: Alert oriented x 4, grossly nonfocal Psychological:  Alert and cooperative. Normal mood and affect  Assessment and Recommendations:  1. Personal history of adenomatous colon polyps. [facility] colonoscopy due in 03/2019.   2. GERD. Continue omeprazole 40 mg qam and ranitidine 300 mg hs for long term mgmt. Follow antireflux measures. REV in 1 year.  3. Constipation, chronic. Continue Colace daily. Take MiraLAX as needed.  I spent 15 minutes of face-to-face time with the patient. Greater than 50% of the time was spent counseling and coordinating care.

## 2015-07-30 ENCOUNTER — Other Ambulatory Visit: Payer: Self-pay | Admitting: Internal Medicine

## 2015-07-30 DIAGNOSIS — E010 Iodine-deficiency related diffuse (endemic) goiter: Secondary | ICD-10-CM | POA: Diagnosis not present

## 2015-07-30 DIAGNOSIS — G4700 Insomnia, unspecified: Secondary | ICD-10-CM | POA: Diagnosis not present

## 2015-07-30 DIAGNOSIS — R7989 Other specified abnormal findings of blood chemistry: Secondary | ICD-10-CM | POA: Diagnosis not present

## 2015-08-01 ENCOUNTER — Ambulatory Visit
Admission: RE | Admit: 2015-08-01 | Discharge: 2015-08-01 | Disposition: A | Discharge status: Home / Self Care | Payer: Medicare Other | Source: Ambulatory Visit | Attending: Internal Medicine | Admitting: Internal Medicine

## 2015-08-01 DIAGNOSIS — H40013 Open angle with borderline findings, low risk, bilateral: Secondary | ICD-10-CM | POA: Diagnosis not present

## 2015-08-01 DIAGNOSIS — H1013 Acute atopic conjunctivitis, bilateral: Secondary | ICD-10-CM | POA: Diagnosis not present

## 2015-08-01 DIAGNOSIS — E010 Iodine-deficiency related diffuse (endemic) goiter: Secondary | ICD-10-CM

## 2015-08-01 DIAGNOSIS — H04123 Dry eye syndrome of bilateral lacrimal glands: Secondary | ICD-10-CM | POA: Diagnosis not present

## 2015-08-01 DIAGNOSIS — E041 Nontoxic single thyroid nodule: Secondary | ICD-10-CM | POA: Diagnosis not present

## 2015-08-18 ENCOUNTER — Ambulatory Visit: Payer: Medicare Other | Admitting: Allergy and Immunology

## 2015-08-18 DIAGNOSIS — R05 Cough: Secondary | ICD-10-CM | POA: Diagnosis not present

## 2015-08-18 DIAGNOSIS — J111 Influenza due to unidentified influenza virus with other respiratory manifestations: Secondary | ICD-10-CM | POA: Diagnosis not present

## 2015-08-18 DIAGNOSIS — R509 Fever, unspecified: Secondary | ICD-10-CM | POA: Diagnosis not present

## 2015-08-27 DIAGNOSIS — J45901 Unspecified asthma with (acute) exacerbation: Secondary | ICD-10-CM | POA: Diagnosis not present

## 2015-08-27 DIAGNOSIS — J111 Influenza due to unidentified influenza virus with other respiratory manifestations: Secondary | ICD-10-CM | POA: Diagnosis not present

## 2015-08-27 DIAGNOSIS — R05 Cough: Secondary | ICD-10-CM | POA: Diagnosis not present

## 2015-09-05 ENCOUNTER — Ambulatory Visit
Admission: RE | Admit: 2015-09-05 | Discharge: 2015-09-05 | Disposition: A | Discharge status: Home / Self Care | Payer: Medicare Other | Source: Ambulatory Visit | Attending: Internal Medicine | Admitting: Internal Medicine

## 2015-09-05 ENCOUNTER — Other Ambulatory Visit: Payer: Self-pay | Admitting: Internal Medicine

## 2015-09-05 DIAGNOSIS — R059 Cough, unspecified: Secondary | ICD-10-CM

## 2015-09-05 DIAGNOSIS — R05 Cough: Secondary | ICD-10-CM

## 2015-09-05 DIAGNOSIS — B370 Candidal stomatitis: Secondary | ICD-10-CM | POA: Diagnosis not present

## 2015-09-05 DIAGNOSIS — J4541 Moderate persistent asthma with (acute) exacerbation: Secondary | ICD-10-CM | POA: Diagnosis not present

## 2015-09-18 DIAGNOSIS — H2000 Unspecified acute and subacute iridocyclitis: Secondary | ICD-10-CM | POA: Diagnosis not present

## 2015-09-23 ENCOUNTER — Encounter: Payer: Self-pay | Admitting: Allergy and Immunology

## 2015-09-23 ENCOUNTER — Ambulatory Visit (INDEPENDENT_AMBULATORY_CARE_PROVIDER_SITE_OTHER): Payer: Medicare Other | Admitting: Allergy and Immunology

## 2015-09-23 VITALS — BP 130/72 | HR 88 | Resp 18

## 2015-09-23 DIAGNOSIS — H1010 Acute atopic conjunctivitis, unspecified eye: Secondary | ICD-10-CM | POA: Diagnosis not present

## 2015-09-23 DIAGNOSIS — K219 Gastro-esophageal reflux disease without esophagitis: Secondary | ICD-10-CM

## 2015-09-23 DIAGNOSIS — J4531 Mild persistent asthma with (acute) exacerbation: Secondary | ICD-10-CM | POA: Diagnosis not present

## 2015-09-23 DIAGNOSIS — J387 Other diseases of larynx: Secondary | ICD-10-CM | POA: Diagnosis not present

## 2015-09-23 DIAGNOSIS — J309 Allergic rhinitis, unspecified: Secondary | ICD-10-CM

## 2015-09-23 MED ORDER — HYDROCOD POLST-CPM POLST ER 10-8 MG/5ML PO SUER: ORAL | Status: DC

## 2015-09-23 MED ORDER — METHYLPREDNISOLONE ACETATE 80 MG/ML IJ SUSP: 80.0000 mg | Freq: Once | INTRAMUSCULAR | Status: DC

## 2015-09-23 MED ORDER — MOMETASONE FUROATE 220 MCG/INH IN AEPB: INHALATION_SPRAY | RESPIRATORY_TRACT | Status: DC

## 2015-09-23 NOTE — Progress Notes (Signed)
Follow-up Note  Referring Provider: Ronnell Guadalajara* Primary Provider: Lottie Dawson, MD Date of Office Visit: 09/23/2015  Subjective:   Christina Deleon (DOB: April 20, 1951) is a 65 y.o. female who returns to the [facility] [place] on 09/23/2015 in re-evaluation of the following:  HPI: Vitalia presents to this clinic in evaluation of a respiratory tract flare up. Apparently 6 weeks ago she contracted influenza presenting as a high fever and cough and upper airway symptoms for which she was treated with Tamiflu and she also received a steroid injection at the beginning of May. Her head has for the most part cleared out but she has continued to cough extensively. She uses a bronchodilator about 1 time per day. Her cough is disturbing her sleep. She does not have any ugly nasal discharge or anosmia but certainly has raspy voice. She is not had recrudescence of her fever.  Prior to this event she had really been doing quite well while consistently using her omeprazole and ranitidine as well as her nasal and inhaled steroids. Rarely does she require a short acting bronchodilator and she can exert herself without any problem. She is not required a systemic steroid to treat her condition. She did not have any episodes of sinusitis.    Medication List           albuterol 108 (90 Base) MCG/ACT inhaler  Commonly known as:  PROVENTIL HFA;VENTOLIN HFA  Inhale 2 puffs into the lungs every 6 (six) hours as needed for wheezing or shortness of breath.     albuterol 108 (90 Base) MCG/ACT inhaler  Commonly known as:  PROAIR HFA  Inhale 2 puffs into the lungs every 6 (six) hours as needed for wheezing or shortness of breath.     CALCIUM PO  Take 1 tablet by mouth daily.     candesartan 16 MG tablet  Commonly known as:  ATACAND  Take 16 mg by mouth at bedtime.     cetirizine 10 MG tablet  Commonly known as:  ZYRTEC  Take 1 tablet (10 mg total) by mouth daily. One tab daily  for allergies     conjugated estrogens vaginal cream  Commonly known as:  PREMARIN  Place 1 Applicatorful vaginally daily. Use 1/2 g vaginally twice a week     cyclopentolate 1 % ophthalmic solution  Commonly known as:  CYCLODRYL,CYCLOGYL     fluticasone 50 MCG/ACT nasal spray  Commonly known as:  FLONASE  Place 2 sprays into both nostrils daily.     methocarbamol 500 MG tablet  Commonly known as:  ROBAXIN  Take 500 mg by mouth daily as needed for muscle spasms.     mometasone 220 MCG/INH inhaler  Commonly known as:  ASMANEX  Inhale 1 puff into the lungs 2 (two) times daily.     montelukast 10 MG tablet  Commonly known as:  SINGULAIR  Take 1 tablet (10 mg total) by mouth daily with breakfast.     multivitamin Liqd  Take 5 mLs by mouth daily.     nabumetone 500 MG tablet  Commonly known as:  RELAFEN  Take 500 mg by mouth 2 (two) times daily as needed for mild pain.     omeprazole 40 MG capsule  Commonly known as:  PRILOSEC  Take 1 capsule (40 mg total) by mouth daily.     polyethylene glycol powder powder  Commonly known as:  GLYCOLAX/MIRALAX  Take 17 g by mouth daily.  pravastatin 20 MG tablet  Commonly known as:  PRAVACHOL  Take 20 mg by mouth daily.     ranitidine 300 MG tablet  Commonly known as:  ZANTAC  Take 1 tablet (300 mg total) by mouth at bedtime.     VITAMIN A PO  Take 1 tablet by mouth daily.     VITAMIN B-12 PO  Take 1 tablet by mouth daily.     VITAMIN C PO  Take 1 tablet by mouth daily.     Vitamin D (Ergocalciferol) 50000 units Caps capsule  Commonly known as:  DRISDOL  Take 1 capsule (50,000 Units total) by mouth every 7 (seven) days.     zolmitriptan 2.5 MG disintegrating tablet  Commonly known as:  ZOMIG ZMT  Take 1 tablet (2.5 mg total) by mouth as needed for migraine.     zolpidem 5 MG tablet  Commonly known as:  AMBIEN  Take 5 mg by mouth at bedtime as needed for sleep.        Past Medical History  Diagnosis Date  .  Arthritis     osteo  . GERD (gastroesophageal reflux disease)   . Hyperlipidemia   . Hypertension   . Allergy   . Chronic gastritis   . Hemorrhoids   . Sleep disturbance   . Adenomatous colon polyp 10/1997, and 03/2009  . Menorrhagia     partial hysterectomy  . Migraines   . 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 Asthma     Past Surgical History  Procedure Laterality Date  . Vaginal hysterectomy  1998    secondary to prolapse, ovaries remain  . Tonsillectomy  age 67  . Turbinate sinus surgery  2003  . Cataract extraction      Allergies  Allergen Reactions  . Trazodone Hcl Other (See Comments)    REACTION: bad headaches  . Penicillins Nausea Only    Has patient had a PCN reaction causing immediate rash, facial/tongue/throat swelling, SOB or lightheadedness with hypotension: No Has patient had a PCN reaction causing severe rash involving mucus membranes or skin necrosis: No Has patient had a PCN reaction that required hospitalization No Has patient had a PCN reaction occurring within the last 10 years: No If all of the above answers are "NO", then may proceed with Cephalosporin use.     Review of systems negative except as noted in HPI / PMHx or noted below:  Review of Systems  Constitutional: Negative.   HENT: Negative.   Eyes: Negative.   Respiratory: Negative.   Cardiovascular: Negative.   Gastrointestinal: Negative.   Genitourinary: Negative.   Musculoskeletal: Negative.   Skin: Negative.   Neurological: Negative.   Endo/Heme/Allergies: Negative.   Psychiatric/Behavioral: Negative.      Objective:   Filed Vitals:   09/23/15 1517  BP: 130/72  Pulse: 88  Resp: 18          Physical Exam  Constitutional: She is well-developed, well-nourished, and in no distress.  Raspy voice, throat clearing  HENT:  Head: Normocephalic.  Right Ear: Tympanic membrane, external ear and ear canal normal.  Left Ear: Tympanic  membrane, external ear and ear canal normal.  Nose: Nose normal. No mucosal edema or rhinorrhea.  Mouth/Throat: Uvula is midline, oropharynx is clear and moist and mucous membranes are normal. No oropharyngeal exudate.  Eyes: Conjunctivae are normal.  Neck: Trachea normal. No tracheal tenderness present. No tracheal deviation present. No thyromegaly present.  Cardiovascular: Normal rate, regular rhythm, S1 normal, S2 normal and normal heart sounds.   No murmur heard. Pulmonary/Chest: Breath sounds normal. No stridor. No respiratory distress. She has no wheezes. She has no rales.  Musculoskeletal: She exhibits no edema.  Lymphadenopathy:       Head (right side): No tonsillar adenopathy present.       Head (left side): No tonsillar adenopathy present.    She has no cervical adenopathy.  Neurological: She is alert. Gait normal.  Skin: No rash noted. She is not diaphoretic. No erythema. Nails show no clubbing.  Psychiatric: Mood and affect normal.    Diagnostics:    Spirometry was performed and demonstrated an FEV1 of 1.82 at 96 % of predicted.  The patient had an Asthma Control Test with the following results:  .    Assessment and Plan:   1. Asthma, not well controlled, mild persistent, with acute exacerbation   2. Allergic rhinoconjunctivitis   3. LPRD (laryngopharyngeal reflux disease)     1.  Continue omeprazole 40 mg in the Morning and ranitidine 300mg  in the evening  2. Continue Asmanex 220 one inhalation two times per day  3. Continue flonase one spray each nostril 1-2 times per day  4. Continue ProAir HFA and Zyrtec as needed  5. Tussionex 2.5-5.0 ML's every 12 hours if needed for cough. Narcotic warning. 33 ML's. No refill  6. Chest x-ray?  7. Return to clinic in 6 months or earlier if problem  Lilliana appears to have developed significant respiratory tract inflammation and irritation as a result of her recent influenza infection and I will assume that with the plan  mentioned above which includes aggressive therapy directed against reflux and inflammation she will do better over the next week or 2. I'll hold off on any x-ray at this point in time unless of course she does not improve. She was doing quite well prior to this reaction and we'll seem she'll continue to do well on the therapy mentioned above which includes anti-inflammatory therapy in the form of Asmanex and Flonase and reflux therapy in the form of omeprazole and ranitidine. I'll see her back in this clinic in 6 months or earlier if there is a problem.  Allena Katz, MD [place]

## 2015-09-23 NOTE — Patient Instructions (Addendum)
  1.  Continue omeprazole 40 mg in the Morning and ranitidine 300mg  in the evening  2. Continue Asmanex 220 one inhalation two times per day  3. Continue flonase one spray each nostril 1-2 times per day  4. Continue ProAir HFA and Zyrtec as needed  5. Tussionex 2.5-5.0 ML's every 12 hours if needed for cough. Narcotic warning. 53 ML's. No refill  6. Chest x-ray?  7. Return to clinic in 6 months or earlier if problem

## 2015-09-30 DIAGNOSIS — H40013 Open angle with borderline findings, low risk, bilateral: Secondary | ICD-10-CM | POA: Diagnosis not present

## 2015-09-30 DIAGNOSIS — H35371 Puckering of macula, right eye: Secondary | ICD-10-CM | POA: Diagnosis not present

## 2015-09-30 DIAGNOSIS — H35372 Puckering of macula, left eye: Secondary | ICD-10-CM | POA: Diagnosis not present

## 2015-09-30 DIAGNOSIS — H2000 Unspecified acute and subacute iridocyclitis: Secondary | ICD-10-CM | POA: Diagnosis not present

## 2015-10-08 ENCOUNTER — Encounter: Payer: Self-pay | Admitting: Pulmonary Disease

## 2015-10-08 ENCOUNTER — Ambulatory Visit (INDEPENDENT_AMBULATORY_CARE_PROVIDER_SITE_OTHER): Payer: Medicare Other | Admitting: Pulmonary Disease

## 2015-10-08 ENCOUNTER — Telehealth: Payer: Self-pay | Admitting: Pulmonary Disease

## 2015-10-08 ENCOUNTER — Other Ambulatory Visit: Payer: Medicare Other

## 2015-10-08 VITALS — BP 134/78 | HR 91 | Temp 98.5°F | Ht 64.0 in | Wt 171.4 lb

## 2015-10-08 DIAGNOSIS — K219 Gastro-esophageal reflux disease without esophagitis: Secondary | ICD-10-CM | POA: Diagnosis not present

## 2015-10-08 DIAGNOSIS — J45909 Unspecified asthma, uncomplicated: Secondary | ICD-10-CM | POA: Diagnosis not present

## 2015-10-08 DIAGNOSIS — J309 Allergic rhinitis, unspecified: Secondary | ICD-10-CM

## 2015-10-08 DIAGNOSIS — R0683 Snoring: Secondary | ICD-10-CM

## 2015-10-08 NOTE — Progress Notes (Signed)
Subjective:    Patient ID: Christina Deleon, female    DOB: 1950/12/18, 65 y.o.   MRN: [id number]  HPI  She reports she was diagnosed with asthma a couple of years ago. No history of breathing problems or recurrent bronchitis as a child or young adult. She reports that for the last 10 years she has had problems with allergies. She reports she has itching in her eyes with sinus congestion & drainage during times of high pollen in the Spring & Fall. She also reports sensitivity to perfumes and smoke. She reports she "breaths harder" during exertion that she has noticed more recently. Nocturnal coughing has been improving over the last couple of days as well. Currently using her rescue inhaler every other day at most. Her husband has noticed wheezing when she is sleeping. She reports problems with breathing when exposed to inhaled irritants. She does wheeze intermittently during the day. She reports she has intermittent coughing that seems to have improved over the last couple of days. She reports previously she was producing a "yellow, thick" mucus but now her cough is nonproductive. She does seem to cough when she is eating as well. She does have reflux as well as reflux into the back of her throat at times. She takes Prilosec in the morning & Zantac at night. She follows with GI. No chest pain or pressure. She has been on Zyrtec & Flonase for years. She uses her Asmanex twice daily starting 1.5 months ago. She is also on Singulair for years. She has had "bronchitis" twice this year treated with treated with antibiotics & Prednisone as well as cough suppressant. She reports prior to this year she has rarely had bronchitis. She reports remote pneumonia. Patient reports poor quality of sleep. Husband does question whether or not she has periods of apnea at night. Snores extremely loudly especially when laying on her back. Denies any daytime napping or easy dosing. Reports she was tested over 20 years ago for sleep  apnea but has gained significant weight since then.  Review of Systems No bruising. She reports an intermittent rash around her neck. No dysuria or hematuria. A pertinent 14 point review of systems is negative except as per the history of presenting illness.  Allergies  Allergen Reactions  . Trazodone Hcl Other (See Comments)    REACTION: bad headaches  . Penicillins Nausea Only    Has patient had a PCN reaction causing immediate rash, facial/tongue/throat swelling, SOB or lightheadedness with hypotension: No Has patient had a PCN reaction causing severe rash involving mucus membranes or skin necrosis: No Has patient had a PCN reaction that required hospitalization No Has patient had a PCN reaction occurring within the last 10 years: No If all of the above answers are "NO", then may proceed with Cephalosporin use.     Current Outpatient Prescriptions on File Prior to Visit  Medication Sig Dispense Refill  . albuterol (PROAIR HFA) 108 (90 BASE) MCG/ACT inhaler Inhale 2 puffs into the lungs every 6 (six) hours as needed for wheezing or shortness of breath. 1 Inhaler 1  . candesartan (ATACAND) 16 MG tablet Take 8 mg by mouth at bedtime.     . cetirizine (ZYRTEC) 10 MG tablet Take 1 tablet (10 mg total) by mouth daily. One tab daily for allergies 30 tablet 1  . chlorpheniramine-HYDROcodone (TUSSIONEX PENNKINETIC ER) 10-8 MG/5ML SUER Can take 2.5 to 5 mls every 12 hours if needed for cough. 60 mL 0  . fluticasone (FLONASE)  50 MCG/ACT nasal spray Place 2 sprays into both nostrils daily. 16 g 2  . methocarbamol (ROBAXIN) 500 MG tablet Take 500 mg by mouth daily as needed for muscle spasms.     . mometasone (ASMANEX 60 METERED DOSES) 220 MCG/INH inhaler Inhale one dose twice daily to prevent cough or wheeze.  Rinse, gargle, and spit after use. 1 Inhaler 5  . montelukast (SINGULAIR) 10 MG tablet Take 1 tablet (10 mg total) by mouth daily with breakfast. 30 tablet 5  . Multiple Vitamin  (MULTIVITAMIN) tablet Take 1 tablet by mouth daily. Calcium, Vitamin B-12, Vitamin A, Vitamin C    . nabumetone (RELAFEN) 500 MG tablet Take 500 mg by mouth 2 (two) times daily as needed for mild pain.    Marland Kitchen omeprazole (PRILOSEC) 40 MG capsule Take 1 capsule (40 mg total) by mouth daily. 30 capsule 11  . polyethylene glycol powder (GLYCOLAX/MIRALAX) powder Take 17 g by mouth daily. (Patient taking differently: Take 17 g by mouth daily as needed. ) 850 g 1  . pravastatin (PRAVACHOL) 20 MG tablet Take 20 mg by mouth daily.      . ranitidine (ZANTAC) 300 MG tablet Take 1 tablet (300 mg total) by mouth at bedtime. 30 tablet 11  . Vitamin D, Ergocalciferol, (DRISDOL) 50000 UNITS CAPS capsule Take 1 capsule (50,000 Units total) by mouth every 7 (seven) days. 30 capsule 3  . zolmitriptan (ZOMIG ZMT) 2.5 MG disintegrating tablet Take 1 tablet (2.5 mg total) by mouth as needed for migraine. 10 tablet 1  . zolpidem (AMBIEN) 5 MG tablet Take 5 mg by mouth at bedtime as needed for sleep.    . prednisoLONE acetate (PRED FORTE) 1 % ophthalmic suspension Reported on 10/08/2015    . [DISCONTINUED] dexlansoprazole (DEXILANT) 60 MG capsule Take 60 mg by mouth daily.       No current facility-administered medications on file prior to visit.    Past Medical History  Diagnosis Date  . Arthritis     osteo  . GERD (gastroesophageal reflux disease)   . Hyperlipidemia   . Hypertension   . Allergy   . Chronic gastritis   . Hemorrhoids   . Sleep disturbance   . Adenomatous colon polyp 10/1997, and 03/2009  . Menorrhagia     partial hysterectomy  . Migraines   . 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 Asthma   . Fibromyalgia   . Allergic rhinitis     Past Surgical History  Procedure Laterality Date  . Vaginal hysterectomy  1998    secondary to prolapse, ovaries remain  . Tonsillectomy  age 18  . Turbinate sinus surgery  2003  . Cataract extraction      Family  History  Problem Relation Age of Onset  . Throat cancer Father   . Coronary artery disease Father   . Heart attack Father 72  . Stroke Father     during procedure  . Coronary artery disease Mother   . Heart failure Mother   . Hypertension Mother   . Hyperlipidemia Mother   . Pulmonary fibrosis Mother   . Lung disease Mother   . Diabetes Paternal Aunt     x 2  . Diabetes Paternal Grandmother   . Coronary artery disease Paternal Grandmother   . Stroke Maternal Grandmother     or MI  . Prostate cancer Paternal Uncle   . Prostate cancer Brother   . Heart disease  Brother     Social History   Social History  . Marital Status: Married    Spouse Name: N/A  . Number of Children: 2  . Years of Education: N/A   Occupational History  . Retired      Metallurgist unemployed sincesince 6/10   Social History Main Topics  . Smoking status: Passive Smoke Exposure - Never Smoker  . Smokeless tobacco: Never Used     Comment: Father growing up  . Alcohol Use: No  . Drug Use: No  . Sexual Activity:    Partners: Male    Birth Control/ Protection: Surgical     Comment: hysterectomy   Other Topics Concern  . None   Social History Narrative   She lives with husband.  They have 2 grown children.   She is retired Product/process development scientist.   Highest level of education:  2 years of college      Regular exercise, diet of fruits, veggies, limited fried foods, limited water      [facility]:   Originally from [place]. Has also lived in [place]. Previously worked doing Software engineer. No pets currently. No bird, mold, or hot tub exposure. Does have a musty smell in their home but it is new. Enjoys reading. 1 indoor plant. Has carpet in her home including in the bedroom. Also has draperies.       Objective:   Physical Exam BP 134/78 mmHg  Pulse 91  Temp(Src) 98.5 F (36.9 C) (Oral)  Ht 5\' 4"  (1.626 m)  Wt 171 lb 6.4 oz (77.747 kg)  BMI 29.41  kg/m2  SpO2 99%  LMP 07/02/1994 General:  Awake. Alert. No acute distress. Husband at bedside.  Integument:  Warm & dry. No rash on exposed skin. No bruising. Lymphatics:  No appreciated cervical or supraclavicular lymphadenoapthy. HEENT:  Moist mucus membranes. No oral ulcers. No scleral injection or icterus. Moderate lateral nasal turbinate swelling. Neck circumference 14 inches. Cardiovascular:  Regular rate. No edema. No appreciable JVD.  Pulmonary:  Good aeration & clear to auscultation bilaterally. Symmetric chest wall expansion. No accessory muscle use. Abdomen: Soft. Normal bowel sounds. Nondistended. Grossly nontender. Musculoskeletal:  Normal bulk and tone. Hand grip strength 5/5 bilaterally. No joint deformity or effusion appreciated. Neurological:  CN 2-12 grossly in tact. No meningismus. Moving all 4 extremities equally. Symmetric brachioradialis deep tendon reflexes. Psychiatric:  Mood and affect congruent. Speech normal rhythm, rate & tone.   PFT 09/23/15: FVC 2.20 L (92%) FEV1 1.82 L (96%) FEV1/FVC 0.82 FEF 25-75 2.00 L (89%)  IMAGING CXR PA/LAT 09/05/15 (personally reviewed by me): No parenchymal nodule or opacity appreciated. Heart normal in size & mediastinum normal in contour. No pleural effusion.    Assessment & Plan:  65 year old female with improving control of her asthma on twice daily Asmanex. Patient is on an excellent regimen for her allergic rhinitis as well as her reflux. I did spend a significant amount of time today counseling the patient on appropriate dietary and lifestyle modifications for control of her reflux is well. I believe continuing her current dose of inhaled steroid therapy is reasonable at this time we further investigate her pulmonary function and the possibility of underlying sleep apnea that may be contributing to her asthma symptoms. Also interestingly the patient's mother has pulmonary fibrosis by report without any identifiable cause. I see no  suggestion of interstitial lung disease on her chest x-ray by my review but if her carbon monoxide diffusion capacity  or lung volumes suggest a parenchymal abnormality we may need to perform high-resolution CT imaging. I instructed the patient to contact my office if she had any new breathing problems before her next appointment or questions.  1. Asthma: Continuing Asmanex 220 twice daily. Checking full pulmonary function testing at follow-up appointment. Continuing as needed albuterol inhaler. 2. GERD: Patient counseled on elevation of the head of the bed as well as lifestyle modifications. Continuing Prilosec & Zantac. If symptoms persist recommended she contact her GI specialist. 3. Allergic rhinitis: Ongoing symptoms. Continuing Flonase, Zyrtec, & Singulair. Checking serum IgE & RAST panel. 4. Snoring/possible witnessed apnea: Checking polysomnogram. 5. Follow-up: Patient to return to clinic in 4-6 weeks or sooner if needed.  Sonia Baller Ashok Cordia, M.D. [facility] Pager:  [phone] After 3pm or if no response, call [phone] 10:12 AM 10/08/2015

## 2015-10-08 NOTE — Patient Instructions (Signed)
   Continue using your Asmanex twice daily  Please call me if your breathing/cough worsens before your next appointment.  I will see you back in 4-6 weeks to review your test results.  TESTS ORDERED: 1. Full PFT at follow-up 2. 6MWT on room air at follow-up 3. Serum IgE & RAST Panel today 4. Polysomnogram

## 2015-10-08 NOTE — Telephone Encounter (Signed)
PFT 09/23/15: FVC 2.20 L (92%) FEV1 1.82 L (96%) FEV1/FVC 0.82 FEF 25-75 2.00 L (89%)  IMAGING CXR PA/LAT 09/05/15 (personally reviewed by me): No parenchymal nodule or opacity appreciated. Heart normal in size & mediastinum normal in contour. No pleural effusion.

## 2015-10-09 DIAGNOSIS — B370 Candidal stomatitis: Secondary | ICD-10-CM | POA: Diagnosis not present

## 2015-10-09 DIAGNOSIS — J4541 Moderate persistent asthma with (acute) exacerbation: Secondary | ICD-10-CM | POA: Diagnosis not present

## 2015-10-09 DIAGNOSIS — K5900 Constipation, unspecified: Secondary | ICD-10-CM | POA: Diagnosis not present

## 2015-10-09 LAB — RESPIRATORY ALLERGY PROFILE REGION II ~~LOC~~
Allergen, Comm Silver Birch, t9: <0.1 kU/L
Allergen, Cottonwood, t14: <0.1 kU/L
Allergen, D pternoyssinus,d7: <0.1 kU/L
Allergen, Mulberry, t76: <0.1 kU/L
Aspergillus fumigatus, m3: <0.1 kU/L
Bermuda Grass: <0.1 kU/L
Box Elder IgE: <0.1 kU/L
Cladosporium Herbarum: <0.1 kU/L
Cockroach: <0.1 kU/L
Common Ragweed: <0.1 kU/L
D. farinae: <0.1 kU/L
Dog Dander: <0.1 kU/L
Elm IgE: <0.1 kU/L
IgE (Immunoglobulin E), Serum: 149 kU/L — ABNORMAL HIGH (ref <115)
Johnson Grass: <0.1 kU/L
Penicillium Notatum: <0.1 kU/L

## 2015-10-14 ENCOUNTER — Ambulatory Visit: Payer: Medicare Other | Admitting: Allergy and Immunology

## 2015-10-16 ENCOUNTER — Ambulatory Visit: Payer: Medicare Other | Admitting: Neurology

## 2015-10-28 DIAGNOSIS — H40013 Open angle with borderline findings, low risk, bilateral: Secondary | ICD-10-CM | POA: Diagnosis not present

## 2015-10-29 ENCOUNTER — Ambulatory Visit (INDEPENDENT_AMBULATORY_CARE_PROVIDER_SITE_OTHER): Payer: Medicare Other | Admitting: Neurology

## 2015-10-29 ENCOUNTER — Encounter: Payer: Self-pay | Admitting: Neurology

## 2015-10-29 VITALS — BP 130/74 | HR 86 | Ht 64.0 in | Wt 171.2 lb

## 2015-10-29 DIAGNOSIS — G43101 Migraine with aura, not intractable, with status migrainosus: Secondary | ICD-10-CM | POA: Diagnosis not present

## 2015-10-29 DIAGNOSIS — G4761 Periodic limb movement disorder: Secondary | ICD-10-CM

## 2015-10-29 MED ORDER — METHOCARBAMOL 500 MG PO TABS: 500.0000 mg | ORAL_TABLET | Freq: Every day | ORAL | Status: DC | PRN

## 2015-10-29 MED ORDER — ZOLMITRIPTAN 2.5 MG PO TBDP: 2.5000 mg | ORAL_TABLET | ORAL | Status: DC | PRN

## 2015-10-29 NOTE — Progress Notes (Signed)
Follow-up Visit   Date: 10/29/2015    WAVE CALZADA MRN: [id number] DOB: [birth year]   Interim History: Christina Deleon is a 65 y.o. right-handed African American female with history of GERD, hypertension, hyperlipidemia, fibromyalgia, and migraines returning to the clinic for follow-up of periodic limb movement of sleep and migraines.  History of present illness: Starting around 2005, her husband started noticing right leg movements especially at night. She lays still about 20-30 seconds followed by episodic jerking of the legs, especially the right side. This occurs only at nighttime for the first few hours of sleep. She takes [place] and about 40 minutes later, the movements start. She had a sleep study and was told that she did have "movement of the legs". She endorses vivid dreams. No history of sleep walking. She has no taken any medications.   She has a history of migraines starting in her teens. Pain is usually on the right side, described as throbbing/sharp pain. She has associated nausea, photosensitivity, and phonophobia. Duration is 4 hr, if she takes zomig, but they typically will occur for the next 3-4 days. She takes zomig which helps. She was started on topamax, but does not take it daily because of side effects.   She complains of one-month history of dizziness, described as lightheadedness. She feels nauseous when she moves her head too quickly. She denies any double vision. It is worse when she bends down to pick up something off the floor. No ear pain or tinnitus.   She had a fall last year and developed a bump on the side of her head which resolved over a few weeks.   UPDATE 01/14/2014:  Dizziness has resolved.  For the past two weeks she has noticed swelling of her left foot and pain over the top of the foot. Denies any repetitive trauma to that area.  Her husband says that she is moving around less at nighttime.  Headaches are also well-controlled, last migraine about 3  weeks ago.  UPDATE 07/15/2014:  She feels that she is about the same, with some good days and bad days.  Migraines continue to occur about 3 times per month and responds to zomig.  Balance continues to be a problem every now and then, but she has not fallen and does not complain of stumbling.  No new complaints.    UPDATE 01/16/2015:  She continues to have migraines, last month it lasted 3 days but also reports this was in the setting a lot of stress. She is using her Zomig 3-4 times per month which helps, which usually starts to have benefit within 30-minutes. She continues to have rare spells of veering to a side when walking. She sometimes has to hold onto her husband for support.  No falls.   UPDATE 10/29/2015:   She reports having a rough year with two bouts of bronchitis, asthma, and flu.  She is also the primary caregiver to her 26 year-old mother who lives 3 hours away.  She complains of right foot cramps, which can be severe at times and last several days.  Headaches were intermittent worse several months ago while she was also dealing with bronchitis, but after she was placed on prednisone, headaches improved.  She continues to use zomig about 2-3 days per month, which provides relief within 30-minutes.      Medications:  Current Outpatient Prescriptions on File Prior to Visit  Medication Sig Dispense Refill  . albuterol (PROAIR HFA) 108 (90 BASE) MCG/ACT  inhaler Inhale 2 puffs into the lungs every 6 (six) hours as needed for wheezing or shortness of breath. 1 Inhaler 1  . candesartan (ATACAND) 16 MG tablet Take 8 mg by mouth at bedtime.     . cetirizine (ZYRTEC) 10 MG tablet Take 1 tablet (10 mg total) by mouth daily. One tab daily for allergies 30 tablet 1  . Difluprednate (DUREZOL OP) Apply to eye 4 (four) times daily. Left eye    . fluticasone (FLONASE) 50 MCG/ACT nasal spray Place 2 sprays into both nostrils daily. 16 g 2  . mometasone (ASMANEX 60 METERED DOSES) 220 MCG/INH inhaler  Inhale one dose twice daily to prevent cough or wheeze.  Rinse, gargle, and spit after use. 1 Inhaler 5  . montelukast (SINGULAIR) 10 MG tablet Take 1 tablet (10 mg total) by mouth daily with breakfast. 30 tablet 5  . Multiple Vitamin (MULTIVITAMIN) tablet Take 1 tablet by mouth daily. Calcium, Vitamin B-12, Vitamin A, Vitamin C    . nabumetone (RELAFEN) 500 MG tablet Take 500 mg by mouth 2 (two) times daily as needed for mild pain.    Marland Kitchen omeprazole (PRILOSEC) 40 MG capsule Take 1 capsule (40 mg total) by mouth daily. 30 capsule 11  . polyethylene glycol powder (GLYCOLAX/MIRALAX) powder Take 17 g by mouth daily. (Patient taking differently: Take 17 g by mouth daily as needed. ) 850 g 1  . pravastatin (PRAVACHOL) 20 MG tablet Take 20 mg by mouth daily.      . ranitidine (ZANTAC) 300 MG tablet Take 1 tablet (300 mg total) by mouth at bedtime. 30 tablet 11  . Vitamin D, Ergocalciferol, (DRISDOL) 50000 UNITS CAPS capsule Take 1 capsule (50,000 Units total) by mouth every 7 (seven) days. 30 capsule 3  . zolpidem (AMBIEN) 5 MG tablet Take 5 mg by mouth at bedtime as needed for sleep.    . [DISCONTINUED] dexlansoprazole (DEXILANT) 60 MG capsule Take 60 mg by mouth daily.       No current facility-administered medications on file prior to visit.    Allergies:  Allergies  Allergen Reactions  . Trazodone Hcl Other (See Comments)    REACTION: bad headaches  . Penicillins Nausea Only    Has patient had a PCN reaction causing immediate rash, facial/tongue/throat swelling, SOB or lightheadedness with hypotension: No Has patient had a PCN reaction causing severe rash involving mucus membranes or skin necrosis: No Has patient had a PCN reaction that required hospitalization No Has patient had a PCN reaction occurring within the last 10 years: No If all of the above answers are "NO", then may proceed with Cephalosporin use.      Review of Systems:  CONSTITUTIONAL: No fevers, chills, night sweats, or  weight loss.   EYES: No visual changes or eye pain ENT: No hearing changes.  No history of nose bleeds.   RESPIRATORY: No cough, wheezing and shortness of breath.   CARDIOVASCULAR: Negative for chest pain, and palpitations.   GI: Negative for abdominal discomfort, blood in stools or black stools.  No recent change in bowel habits.   GU:  No history of incontinence.   MUSCLOSKELETAL: No history of joint pain or swelling.  No myalgias.   SKIN: Negative for lesions, rash, and itching.   ENDOCRINE: Negative for cold or heat intolerance, polydipsia or goiter.   PSYCH:  No depression or anxiety symptoms.   NEURO: As Above.   Vital Signs:  BP 130/74 mmHg  Pulse 86  Ht '5\' 4"'  (1.626  m)  Wt 171 lb 4 oz (77.678 kg)  BMI 29.38 kg/m2  SpO2 99%  LMP 07/02/1994  Neurological Exam: MENTAL STATUS including orientation to time, place, person is normal.  Speech is not dysarthric.  CRANIAL NERVES:  Pupils equal round and reactive to light. Face is symmetric.   MOTOR:  Motor strength is 5/5 in all extremities.    MSRs:  Reflexes are 2+/4 throughout  COORDINATION/GAIT:  Gait narrow based and stable. Tandem gait is unsteady.  Data: Labs 11/06/2013:  ESR 15, vitamin B12 241, TSH 1.9, ferritin 37  EEG 11/14/2013: This awake and asleep EEG is within normal limits.  MRI cervical spine wo contrast 05/14/2008: Cervical disc degeneration and spondylosis as above. No change from 2005.   MRI brain wo contrast 03/17/2004:  Unremarkable MRI of the brain. No acute findings. benign arachnoid granulations (giant pacchionian granulations), and I feel that this finding represents a normal anatomic variant as opposed to pathology. At this point, I cannot make the diagnosis of a pathologic lesion such as an eosinophilic granuloma. Again I favor that this represents a benign process, i.e. giant arachnoid granulations.   MRI/A brain and MRA neck 12/05/2013: 1. Stable and negative MRI appearance of the brain;  inadvertently  diffusion-weighted imaging was not performed. Attempts will be made to brain the patient back for diffusion weighted imaging, and an addendum will be dictated.  2. Negative MRA head and neck.    IMPRESSION/PLAN: 1. 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 Episodic migraine   - Headaches days ~3-5/month  - Continue zomig 2.63m60ms needed for acute migraine  3. Lightheadedness/dizziness causing gait instability, improved  - MRI/A brain and MRA neck is normal  - Encouraged to drink water  4.  Low vitamin B12  - Continue vitamin B12 1000m663maily  Return to clinic in 1 year  The duration of this appointment visit was 25 minutes of face-to-face time with the patient.  Greater than 50% of this time was spent in counseling, explanation of diagnosis, planning of further management, and coordination of care.   Thank you for allowing me to participate in patient's care.  If I can answer any additional questions, I would be pleased to do so.    Sincerely,    Donika K. PatePosey Pronto

## 2015-10-29 NOTE — Patient Instructions (Addendum)
You can use robaxin 500mg  for muscle spasms Refills have been sent for zomig for migraine  Return to clinic 1 year

## 2015-11-07 ENCOUNTER — Other Ambulatory Visit: Payer: Self-pay | Admitting: *Deleted

## 2015-11-07 ENCOUNTER — Other Ambulatory Visit: Payer: Self-pay | Admitting: Allergy and Immunology

## 2015-11-07 MED ORDER — MONTELUKAST SODIUM 10 MG PO TABS: ORAL_TABLET | ORAL | Status: DC

## 2015-11-07 MED ORDER — MONTELUKAST SODIUM 10 MG PO TABS: 10.0000 mg | ORAL_TABLET | Freq: Every day | ORAL | Status: DC

## 2015-11-07 NOTE — Telephone Encounter (Signed)
Spoke to patient advised we sent in early but it was 30 day will send in 90 day patient verbalized understanding

## 2015-11-07 NOTE — Telephone Encounter (Signed)
Pt called and said that the pharmacy was suppose to sent a refill for singulair 10 yesterday but i dont see it . So please fill this because she has to go out of town because her mother has heart problems. She would like to see if she can get 90 days instead of 30.  [facility] on [address].  [phone].

## 2015-11-13 ENCOUNTER — Other Ambulatory Visit: Payer: Self-pay | Admitting: Internal Medicine

## 2015-11-13 DIAGNOSIS — E041 Nontoxic single thyroid nodule: Secondary | ICD-10-CM | POA: Diagnosis not present

## 2015-11-13 DIAGNOSIS — R1310 Dysphagia, unspecified: Secondary | ICD-10-CM | POA: Diagnosis not present

## 2015-11-13 DIAGNOSIS — M797 Fibromyalgia: Secondary | ICD-10-CM | POA: Diagnosis not present

## 2015-11-13 DIAGNOSIS — J4520 Mild intermittent asthma, uncomplicated: Secondary | ICD-10-CM | POA: Diagnosis not present

## 2015-11-13 DIAGNOSIS — G43109 Migraine with aura, not intractable, without status migrainosus: Secondary | ICD-10-CM | POA: Diagnosis not present

## 2015-11-17 ENCOUNTER — Ambulatory Visit (HOSPITAL_BASED_OUTPATIENT_CLINIC_OR_DEPARTMENT_OTHER): Payer: Medicare Other | Attending: Pulmonary Disease | Admitting: Pulmonary Disease

## 2015-11-17 VITALS — Ht 64.0 in | Wt 168.0 lb

## 2015-11-17 DIAGNOSIS — R0683 Snoring: Secondary | ICD-10-CM | POA: Diagnosis not present

## 2015-11-17 DIAGNOSIS — K219 Gastro-esophageal reflux disease without esophagitis: Secondary | ICD-10-CM | POA: Insufficient documentation

## 2015-11-17 DIAGNOSIS — J45909 Unspecified asthma, uncomplicated: Secondary | ICD-10-CM | POA: Insufficient documentation

## 2015-11-17 DIAGNOSIS — Z79899 Other long term (current) drug therapy: Secondary | ICD-10-CM | POA: Insufficient documentation

## 2015-11-17 DIAGNOSIS — G4761 Periodic limb movement disorder: Secondary | ICD-10-CM | POA: Insufficient documentation

## 2015-11-19 ENCOUNTER — Ambulatory Visit (HOSPITAL_COMMUNITY)
Admission: RE | Admit: 2015-11-19 | Discharge: 2015-11-19 | Disposition: A | Discharge status: Home / Self Care | Payer: Medicare Other | Source: Ambulatory Visit | Attending: Pulmonary Disease | Admitting: Pulmonary Disease

## 2015-11-19 DIAGNOSIS — J45909 Unspecified asthma, uncomplicated: Secondary | ICD-10-CM | POA: Diagnosis not present

## 2015-11-19 DIAGNOSIS — R0683 Snoring: Secondary | ICD-10-CM | POA: Insufficient documentation

## 2015-11-19 DIAGNOSIS — G4761 Periodic limb movement disorder: Secondary | ICD-10-CM | POA: Insufficient documentation

## 2015-11-19 LAB — PULMONARY FUNCTION TEST
DL/VA % PRED: 92 %
DL/VA: 4.46 ml/min/mmHg/L
DLCO UNC % PRED: 71 %
DLCO UNC: 17.39 ml/min/mmHg
FEF 25-75 POST: 2.48 L/s
FEF 25-75 PRE: 2.16 L/s
FEF2575-%Change-Post: 14 %
FEF2575-%PRED-PRE: 116 %
FEF2575-%Pred-Post: 133 %
FEV1-%CHANGE-POST: 2 %
FEV1-%Pred-Post: 110 %
FEV1-%Pred-Pre: 107 %
FEV1-Post: 2.15 L
FEV1-Pre: 2.1 L
FEV1FVC-%Change-Post: 0 %
FEV1FVC-%Pred-Pre: 105 %
FEV6-%CHANGE-POST: 1 %
FEV6-%PRED-POST: 107 %
FEV6-%PRED-PRE: 105 %
FEV6-POST: 2.59 L
FEV6-PRE: 2.54 L
FEV6FVC-%CHANGE-POST: 0 %
FEV6FVC-%PRED-POST: 104 %
FEV6FVC-%Pred-Pre: 103 %
FVC-%Change-Post: 1 %
FVC-%PRED-POST: 103 %
FVC-%Pred-Pre: 101 %
FVC-Post: 2.59 L
FVC-Pre: 2.54 L
Post FEV1/FVC ratio: 83 %
Post FEV6/FVC ratio: 100 %
Pre FEV1/FVC ratio: 83 %
Pre FEV6/FVC Ratio: 100 %
RV % pred: 100 %
RV: 2.1 L
TLC % PRED: 92 %
TLC: 4.68 L

## 2015-11-19 MED ORDER — ALBUTEROL SULFATE (2.5 MG/3ML) 0.083% IN NEBU
2.5000 mg | INHALATION_SOLUTION | Freq: Once | RESPIRATORY_TRACT | Status: AC
  Administered 2015-11-19: 2.5 mg via RESPIRATORY_TRACT

## 2015-11-19 NOTE — Procedures (Signed)
Patient Name: Christina Deleon, Christina Deleon Date: 11/17/2015 Gender: Female D.O.B: Apr 05, 1951 Age (years): 19 Referring Provider: Javier Glazier Height (inches): 37 Interpreting Physician: Chesley Mires MD, ABSM Weight (lbs): 171 RPSGT: Joni Reining BMI: 110 MRN: [id number] Neck Size: 14.00  CLINICAL INFORMATION Sleep Study Type: NPSG   Indication for sleep study: 65 yr old female with snoring, witnessed apnea.  She has hx of asthma and GERD.   Epworth Sleepiness Score: 6   SLEEP STUDY TECHNIQUE As per the AASM Manual for the Scoring of Sleep and Associated Events v2.3 (April 2016) with a hypopnea requiring 4% desaturations. The channels recorded and monitored were frontal, central and occipital EEG, electrooculogram (EOG), submentalis EMG (chin), nasal and oral airflow, thoracic and abdominal wall motion, anterior tibialis EMG, snore microphone, electrocardiogram, and pulse oximetry.  MEDICATIONS Patient's medications include: AMBIEN. Medications self-administered by patient during sleep study : No sleep medicine administered.  SLEEP ARCHITECTURE The study was initiated at 10:24:16 PM and ended at 4:47:59 AM. Sleep onset time was 34.5 minutes and the sleep efficiency was 69.0%. The total sleep time was 264.7 minutes. Stage REM latency was 240.5 minutes. The patient spent 17.94% of the night in stage N1 sleep, 75.44% in stage N2 sleep, 0.00% in stage N3 and 6.61% in REM. Alpha intrusion was absent. Supine sleep was 14.81%.  RESPIRATORY PARAMETERS The overall apnea/hypopnea index (AHI) was 0.7 per hour. There were 1 total apneas, including 1 obstructive, 0 central and 0 mixed apneas. There were 2 hypopneas and 9 RERAs. The AHI during Stage REM sleep was 0.0 per hour. AHI while supine was 0.0 per hour. The mean oxygen saturation was 94.94%. The minimum SpO2 during sleep was 92.00%. Loud snoring was noted during this study.  CARDIAC DATA The 2 lead EKG demonstrated sinus rhythm. The  mean heart rate was 82.73 beats per minute. Other EKG findings include: None.  LEG MOVEMENT DATA The total PLMS were 1259 with a resulting PLMS index of 285.38. Associated arousal with leg movement index was 26.3 .  IMPRESSIONS While she had a few obstructive respiratory events, these were not frequent enough to qualify for diagnosis of obstructive sleep apnea.  Her AHI was 0.7 with SpO2 low of 92%.  She had an increase in her periodic limb movement index.  DIAGNOSIS - Snoring (786.09 [R06.83 ICD-10]) - Asthma (493.90 [J45.909 ICD-10]) - Gastroesophageal Reflux Disease (530.81 [K21.9 ICD-10]) - Periodic Limb Movements of Sleep (327.51 [G47.61 ICD-10])  RECOMMENDATIONS - Both asthma and GERD with nocturnal symptoms can mimic symptoms of obstructive sleep apnea.  She should continue to have her asthma and GERD regimen optimized. - She should be assessed for restless leg syndrome   [Electronically signed] 11/19/2015 08:52 AM  Chesley Mires MD, ABSM Diplomate, American Board of Sleep Medicine   NPI: [id number]

## 2015-11-20 ENCOUNTER — Telehealth: Payer: Self-pay | Admitting: *Deleted

## 2015-11-20 ENCOUNTER — Ambulatory Visit
Admission: RE | Admit: 2015-11-20 | Discharge: 2015-11-20 | Disposition: A | Discharge status: Home / Self Care | Payer: Medicare Other | Source: Ambulatory Visit | Attending: Internal Medicine | Admitting: Internal Medicine

## 2015-11-20 DIAGNOSIS — R1310 Dysphagia, unspecified: Secondary | ICD-10-CM | POA: Diagnosis not present

## 2015-11-20 DIAGNOSIS — K449 Diaphragmatic hernia without obstruction or gangrene: Secondary | ICD-10-CM | POA: Diagnosis not present

## 2015-11-20 NOTE — Telephone Encounter (Signed)
-----   Message from Javier Glazier, MD sent at 11/19/2015  5:38 PM EDT ----- Regarding: Sleep Study Please call the patient and let her know her sleep study doesn't show any sleep apnea. It does raise the suspicion of restless leg syndrome. If she's ok with it put in for an appointment with one of our sleep docs to assess her for possible restless leg syndrome.  Thanks.

## 2015-11-20 NOTE — Telephone Encounter (Signed)
I spoke with patient about results and she verbalized understanding and had no questions Sleep consult scheduled

## 2015-11-25 ENCOUNTER — Encounter: Payer: Self-pay | Admitting: Pulmonary Disease

## 2015-11-25 ENCOUNTER — Ambulatory Visit (INDEPENDENT_AMBULATORY_CARE_PROVIDER_SITE_OTHER): Payer: Medicare Other | Admitting: Pulmonary Disease

## 2015-11-25 ENCOUNTER — Telehealth: Payer: Self-pay | Admitting: Pulmonary Disease

## 2015-11-25 VITALS — BP 126/72 | HR 89 | Ht 64.0 in | Wt 171.8 lb

## 2015-11-25 DIAGNOSIS — J4530 Mild persistent asthma, uncomplicated: Secondary | ICD-10-CM

## 2015-11-25 DIAGNOSIS — K219 Gastro-esophageal reflux disease without esophagitis: Secondary | ICD-10-CM | POA: Diagnosis not present

## 2015-11-25 DIAGNOSIS — J309 Allergic rhinitis, unspecified: Secondary | ICD-10-CM

## 2015-11-25 DIAGNOSIS — Z23 Encounter for immunization: Secondary | ICD-10-CM

## 2015-11-25 NOTE — Progress Notes (Signed)
Peak Flow Meter given and demonstrated --amg

## 2015-11-25 NOTE — Patient Instructions (Signed)
   Continue taking your Asmanex twice daily as you have been.  Remember to check your peak flows twice daily for the next 2 weeks and record your results. You should pay particular attention & contact my office if you notice a decline of more than [id number] or certainly if you are developing any new symptoms.  Please contact your GI doctor to address your ongoing reflux as this can make your asthma worse.  I will see you back in 3 months or sooner if needed.  TESTS ORDERED: 1. Spirometry with bronchodilator challenge & DLCO at next appointment

## 2015-11-25 NOTE — Progress Notes (Signed)
Subjective:    Patient ID: Christina Deleon, female    DOB: Jan 24, 1951, 65 y.o.   MRN: [id number]  C.C.:  Follow-up for Mild, Persistent Asthma, GERD, Allergic Rhinitis, & Possible Restless Leg Syndrome.  HPI  Mild, Persistent Asthma:  Currently on Asmanex bid. Reports she is compliant. Dyspnea significantly improved. Using her rescue inhaler once weekly. Denies any recent coughing or wheezing. No nocturnal awakenings with any breathing problems. No exacerbations since last appointment.   GERD:  Follows with GI.  She reports she is still having reflux in the morning and when laying recumbent at night. Compliant with Ranitidine & Prilosec.   Allergic Rhinitis:  Denies any currently sinus congestion or pressure. Mild, intermittent post-nasal drainage. Compliant with Flonase, Singulair, & Zyrtec.   Possible Restless Leg Syndrome:  Suggested on recent PSG without sleep apnea.  Review of Systems No chest pain or pressure. No fever, chills, or sweats. No bruising. Occasional rash around her neck.   Allergies  Allergen Reactions  . Trazodone Hcl Other (See Comments)    REACTION: bad headaches  . Penicillins Nausea Only    Has patient had a PCN reaction causing immediate rash, facial/tongue/throat swelling, SOB or lightheadedness with hypotension: No Has patient had a PCN reaction causing severe rash involving mucus membranes or skin necrosis: No Has patient had a PCN reaction that required hospitalization No Has patient had a PCN reaction occurring within the last 10 years: No If all of the above answers are "NO", then may proceed with Cephalosporin use.     Current Outpatient Prescriptions on File Prior to Visit  Medication Sig Dispense Refill  . albuterol (PROAIR HFA) 108 (90 BASE) MCG/ACT inhaler Inhale 2 puffs into the lungs every 6 (six) hours as needed for wheezing or shortness of breath. 1 Inhaler 1  . candesartan (ATACAND) 16 MG tablet Take 8 mg by mouth at bedtime.     . cetirizine  (ZYRTEC) 10 MG tablet Take 1 tablet (10 mg total) by mouth daily. One tab daily for allergies 30 tablet 1  . Difluprednate (DUREZOL OP) Apply to eye 4 (four) times daily. Left eye    . fluticasone (FLONASE) 50 MCG/ACT nasal spray Place 2 sprays into both nostrils daily. 16 g 2  . methocarbamol (ROBAXIN) 500 MG tablet Take 1 tablet (500 mg total) by mouth daily as needed for muscle spasms. 90 tablet 3  . mometasone (ASMANEX 60 METERED DOSES) 220 MCG/INH inhaler Inhale one dose twice daily to prevent cough or wheeze.  Rinse, gargle, and spit after use. 1 Inhaler 5  . montelukast (SINGULAIR) 10 MG tablet Take 1 tablet (10 mg total) by mouth at bedtime. 90 tablet 1  . Multiple Vitamin (MULTIVITAMIN) tablet Take 1 tablet by mouth daily. Calcium, Vitamin B-12, Vitamin A, Vitamin C    . nabumetone (RELAFEN) 500 MG tablet Take 500 mg by mouth 2 (two) times daily as needed for mild pain.    Marland Kitchen omeprazole (PRILOSEC) 40 MG capsule Take 1 capsule (40 mg total) by mouth daily. 30 capsule 11  . polyethylene glycol powder (GLYCOLAX/MIRALAX) powder Take 17 g by mouth daily. (Patient taking differently: Take 17 g by mouth daily as needed. ) 850 g 1  . pravastatin (PRAVACHOL) 20 MG tablet Take 20 mg by mouth daily.      . ranitidine (ZANTAC) 300 MG tablet Take 1 tablet (300 mg total) by mouth at bedtime. 30 tablet 11  . Vitamin D, Ergocalciferol, (DRISDOL) 50000 UNITS CAPS capsule Take  1 capsule (50,000 Units total) by mouth every 7 (seven) days. 30 capsule 3  . zolmitriptan (ZOMIG ZMT) 2.5 MG disintegrating tablet Take 1 tablet (2.5 mg total) by mouth as needed for migraine. 10 tablet 11  . zolpidem (AMBIEN) 5 MG tablet Take 5 mg by mouth at bedtime as needed for sleep.    . [DISCONTINUED] dexlansoprazole (DEXILANT) 60 MG capsule Take 60 mg by mouth daily.       No current facility-administered medications on file prior to visit.     Past Medical History:  Diagnosis Date  . Adenomatous colon polyp 10/1997, and  03/2009  . Allergic rhinitis   . Allergy   . Arthritis    osteo  . Asthma   . Chronic gastritis   . Fibromyalgia   . GERD (gastroesophageal reflux disease)   . Hemorrhoids   . Hyperlipidemia   . Hypertension   . Menorrhagia    partial hysterectomy  . Migraines   . Sleep disturbance   . 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 CATARACT EXTRACTION    . TONSILLECTOMY  age 36  . tooth extract     with bone graft  . Turbinate sinus surgery  2003  . VAGINAL HYSTERECTOMY  1998   secondary to prolapse, ovaries remain    Family History  Problem Relation Age of Onset  . Throat cancer Father   . Coronary artery disease Father   . Heart attack Father 74  . Stroke Father     during procedure  . Coronary artery disease Mother   . Heart failure Mother   . Hypertension Mother   . Hyperlipidemia Mother   . Pulmonary fibrosis Mother   . Lung disease Mother   . Prostate cancer Brother   . Heart disease Brother   . Diabetes Paternal Aunt     x 2  . Diabetes Paternal Grandmother   . Coronary artery disease Paternal Grandmother   . Stroke Maternal Grandmother     or MI  . Prostate cancer Paternal Uncle     Social History   Social History  . Marital status: Married    Spouse name: N/A  . Number of children: 2  . Years of education: N/A   Occupational History  . Retired      Metallurgist unemployed sincesince 6/10   Social History Main Topics  . Smoking status: Passive Smoke Exposure - Never Smoker  . Smokeless tobacco: Never Used     Comment: Father growing up  . Alcohol use No  . Drug use: No  . Sexual activity: Yes    Partners: Male    Birth control/ protection: Surgical     Comment: hysterectomy   Other Topics Concern  . None   Social History Narrative   She lives with husband.  They have 2 grown children.   She is retired Engineer, maintenance.   Highest level of education:  2 years of college      Regular exercise, diet of fruits, veggies, limited fried foods, limited water      [facility]:   Originally from [place]. Has also lived in [place]. Previously worked doing Software engineer. No pets currently. No bird, mold, or hot tub exposure. Does have a musty smell in their home but it is new. Enjoys reading. 1 indoor plant. Has carpet in her home including  in the bedroom. Also has draperies.       Objective:   Physical Exam BP 126/72 (BP Location: Right Arm, Cuff Size: Normal)   Pulse 89   Ht 5\' 4"  (1.626 m)   Wt 171 lb 12.8 oz (77.9 kg)   LMP 07/02/1994   SpO2 99%   BMI 29.49 kg/m  General:  Awake. No distress. Husband with her today.  Integument:  Warm & dry. No rash on exposed skin.  Lymphatics:  No appreciated cervical or supraclavicular lymphadenoapthy. HEENT:  Normal nasal turbinate swelling. No oral ulcers. Moist mucous membranes. Cardiovascular:  Regular rate. No edema. Normal S1 & S2.  Pulmonary:  Clear bilaterally to auscultation. Normal work of breathing on room air. Speaking in complete sentences. Abdomen: Soft. Normal bowel sounds. Nontender.  PFT 11/19/15: FVC 2.54 L (101%) FEV1 2.10 L (107%) FEV1/FVC 0.83 FEF 25-75 2.16 L (116%) negative bronchodilator response TLC 4.68 L (92%) RV 100% ERV 115% DLCO uncorrected 71% 09/23/15: FVC 2.20 L (92%) FEV1 1.82 L (96%) FEV1/FVC 0.82 FEF 25-75 2.00 L (89%)  6MWT 11/25/15:  Walked 512 meters / Baseline Sat 99% on RA / Nadir Sat 99% on RA (complained of dizziness, hip pain, & calf pain at end of walk)  POLYSOMNOGRAM (11/17/15): AHI 0.7 events/hour. Lower saturation 92%. Loud snoring noted. Periodic limb movement index to 85.38. Arousal associated with leg movement index was 26.3. Recommended assessment for restless leg syndrome.  IMAGING CXR PA/LAT 09/05/15 (previously reviewed by me): No parenchymal nodule or opacity appreciated. Heart  normal in size & mediastinum normal in contour. No pleural effusion.  LABS 10/08/15 IgE: 149 RAST Panel: Negative    Assessment & Plan:  65 year old female with Underlying mild, persistent asthma. Symptomatically the patient has well-controlled at this time on her current inhaler regimen. Her spirometry has significantly improved since previous testing and in the absence of a significant bronchodilator response I do not feel that the addition of a long-acting beta agonist is necessary. I did counsel the patient to notify me if she notices any new symptoms or a greater than 10% decline in her daily peak flows which could indicate early signs of exacerbation. She does have continuing symptoms from her underlying reflux which I explained could potentially worsen her asthma as well as contribute to increased risk of cancer of the esophagus. Her allergic rhinitis seems to be well-controlled at this time. I did review her polysomnogram with her today which does suggest the possibility of restless leg syndrome.  1. Mild, Persistent Asthma: Continuing Asmanex 220 twice daily. Repeat spirometry with bronchodilator challenge at next appointment. Consider escalating or de-escalating treatment depending upon symptoms and results as we progress into her symptomatic season. Patient given a peak flow meter today and instructed on proper use. 2. GERD: Continuing Prilosec & Zantac. Recommended contacting her GI specialist for further treatment options and recommendations with ongoing symptoms. 3. Allergic Rhinitis: Continuing Flonase, Zyrtec, & Singulair. No changes at this time. 4. Possible RLS: Referred to sleep physician. 5. Health Maintenance: Status post influenza vaccine December 2016, Pneumovax July 2015, & Tdap July 2011. Administering Prevnar vaccine today.  6. Follow-up: Patient to return to clinic in 3 months or sooner if needed.  Sonia Baller Ashok Cordia, M.D. [facility] Pager:   [phone] After 3pm or if no response, call [phone] 10:58 AM 11/25/15

## 2015-11-25 NOTE — Addendum Note (Signed)
Addended by: Virl Cagey on: 11/25/2015 11:32 AM   Modules accepted: Orders

## 2015-11-25 NOTE — Telephone Encounter (Signed)
PFT 11/19/15: FVC 2.54 L (101%) FEV1 2.10 L (107%) FEV1/FVC 0.83 FEF 25-75 2.16 L (116%) negative bronchodilator response TLC 4.68 L (92%) RV 100% ERV 115% DLCO uncorrected 71%  POLYSOMNOGRAM (11/17/15): AHI 0.7 events/hour. Lower saturation 92%. Loud snoring noted. Periodic limb movement index to 85.38. Arousal associated with leg movement index was 26.3. Recommended assessment for restless leg syndrome.  LABS 10/08/15 IgE: 149 RAST Panel: Negative

## 2015-11-26 DIAGNOSIS — R1319 Other dysphagia: Secondary | ICD-10-CM | POA: Diagnosis not present

## 2015-11-26 DIAGNOSIS — E041 Nontoxic single thyroid nodule: Secondary | ICD-10-CM | POA: Diagnosis not present

## 2015-11-26 NOTE — Progress Notes (Signed)
Test reviewed.  

## 2015-11-29 ENCOUNTER — Other Ambulatory Visit: Payer: Self-pay | Admitting: Allergy and Immunology

## 2015-12-10 ENCOUNTER — Other Ambulatory Visit: Payer: Self-pay | Admitting: Allergy and Immunology

## 2015-12-11 DIAGNOSIS — Z79899 Other long term (current) drug therapy: Secondary | ICD-10-CM | POA: Diagnosis not present

## 2015-12-15 DIAGNOSIS — G4709 Other insomnia: Secondary | ICD-10-CM | POA: Diagnosis not present

## 2015-12-15 DIAGNOSIS — M797 Fibromyalgia: Secondary | ICD-10-CM | POA: Diagnosis not present

## 2015-12-15 DIAGNOSIS — R5381 Other malaise: Secondary | ICD-10-CM | POA: Diagnosis not present

## 2015-12-15 DIAGNOSIS — E559 Vitamin D deficiency, unspecified: Secondary | ICD-10-CM | POA: Diagnosis not present

## 2015-12-17 ENCOUNTER — Telehealth: Payer: Self-pay | Admitting: Nurse Practitioner

## 2015-12-17 NOTE — Telephone Encounter (Signed)
Left message regarding upcoming appointment has been canceled and needs to be rescheduled. °

## 2016-01-15 DIAGNOSIS — E041 Nontoxic single thyroid nodule: Secondary | ICD-10-CM | POA: Diagnosis not present

## 2016-01-15 DIAGNOSIS — Z1389 Encounter for screening for other disorder: Secondary | ICD-10-CM | POA: Diagnosis not present

## 2016-01-15 DIAGNOSIS — Z0001 Encounter for general adult medical examination with abnormal findings: Secondary | ICD-10-CM | POA: Diagnosis not present

## 2016-01-15 DIAGNOSIS — I10 Essential (primary) hypertension: Secondary | ICD-10-CM | POA: Diagnosis not present

## 2016-01-15 DIAGNOSIS — E785 Hyperlipidemia, unspecified: Secondary | ICD-10-CM | POA: Diagnosis not present

## 2016-01-19 DIAGNOSIS — L309 Dermatitis, unspecified: Secondary | ICD-10-CM | POA: Diagnosis not present

## 2016-01-19 DIAGNOSIS — R600 Localized edema: Secondary | ICD-10-CM | POA: Diagnosis not present

## 2016-01-20 DIAGNOSIS — L2084 Intrinsic (allergic) eczema: Secondary | ICD-10-CM | POA: Diagnosis not present

## 2016-01-20 DIAGNOSIS — D225 Melanocytic nevi of trunk: Secondary | ICD-10-CM | POA: Diagnosis not present

## 2016-01-20 DIAGNOSIS — Z23 Encounter for immunization: Secondary | ICD-10-CM | POA: Diagnosis not present

## 2016-01-21 ENCOUNTER — Encounter: Payer: Self-pay | Admitting: Gastroenterology

## 2016-01-21 ENCOUNTER — Ambulatory Visit (INDEPENDENT_AMBULATORY_CARE_PROVIDER_SITE_OTHER): Payer: Medicare Other | Admitting: Gastroenterology

## 2016-01-21 VITALS — BP 130/70 | HR 64 | Ht 64.0 in | Wt 169.2 lb

## 2016-01-21 DIAGNOSIS — K5900 Constipation, unspecified: Secondary | ICD-10-CM

## 2016-01-21 DIAGNOSIS — K219 Gastro-esophageal reflux disease without esophagitis: Secondary | ICD-10-CM | POA: Diagnosis not present

## 2016-01-21 MED ORDER — OMEPRAZOLE 40 MG PO CPDR: 40.0000 mg | DELAYED_RELEASE_CAPSULE | Freq: Two times a day (BID) | ORAL | 3 refills | Status: DC

## 2016-01-21 NOTE — Progress Notes (Signed)
    History of Present Illness: This is a 65 year old female with GERD. She relates frequent heartburn, a lump in her throat sensation, clearing her throat and a cough. She's been maintained on omeprazole 40 mg every morning and ranitidine 300 mg at bedtime. She relates problems with mild constipation gas and bloating as well. EGD revealed mild chronic gastritis in 03/2009. Last colonoscopy in 03/22/2014 showed internal hemorrhoids.  Current Medications, Allergies, Past Medical History, Past Surgical History, Family History and Social History were reviewed in [facility] record.  Physical Exam: General: Well developed, well nourished, no acute distress Head: Normocephalic and atraumatic Eyes:  sclerae anicteric, EOMI Ears: Normal auditory acuity Mouth: No deformity or lesions Lungs: Clear throughout to auscultation Heart: Regular rate and rhythm; no murmurs, rubs or bruits Abdomen: Soft, non tender and non distended. No masses, hepatosplenomegaly or hernias noted. Normal Bowel sounds Musculoskeletal: Symmetrical with no gross deformities  Pulses:  Normal pulses noted Extremities: No clubbing, cyanosis, edema or deformities noted Neurological: Alert oriented x 4, grossly nonfocal Psychological:  Alert and cooperative. Normal mood and affect  Assessment and Recommendations:  1. GERD with LPR. Increase omeprazole to 40 mg twice daily taken before breakfast and dinner. Continue ranitidine 300 mg at bedtime. Intensify antireflux measures. If symptoms haven't substantially improved in 6-8 weeks consider changing to another PPI and consider EGD.  2. Constipation, gas, bloating. Increase daily water and fiber intake. Continue using MiraLAX daily prn however use it more frequently. Trial of 3-4 different probiotics for 1-2 weeks each and if symptoms improve she will remain on the probiotic. Gas-X 4 times a day when necessary.  3. Personal history of adenomatous colon polyps.  Five-year surveillance colonoscopy is recommended in November 2020.

## 2016-01-21 NOTE — Patient Instructions (Signed)
We have sent the following medications to your pharmacy for you to pick up at your convenience: Omeprazole 40 mg twice daily  Please call in 6-8 weeks if not feeling better.

## 2016-01-22 ENCOUNTER — Ambulatory Visit (INDEPENDENT_AMBULATORY_CARE_PROVIDER_SITE_OTHER): Payer: Medicare Other | Admitting: Pulmonary Disease

## 2016-01-22 ENCOUNTER — Other Ambulatory Visit (INDEPENDENT_AMBULATORY_CARE_PROVIDER_SITE_OTHER): Payer: Medicare Other

## 2016-01-22 ENCOUNTER — Encounter: Payer: Self-pay | Admitting: Pulmonary Disease

## 2016-01-22 VITALS — BP 132/78 | HR 85 | Ht 64.0 in | Wt 170.0 lb

## 2016-01-22 DIAGNOSIS — Z862 Personal history of diseases of the blood and blood-forming organs and certain disorders involving the immune mechanism: Secondary | ICD-10-CM

## 2016-01-22 DIAGNOSIS — G2581 Restless legs syndrome: Secondary | ICD-10-CM | POA: Diagnosis not present

## 2016-01-22 DIAGNOSIS — G4700 Insomnia, unspecified: Secondary | ICD-10-CM | POA: Diagnosis not present

## 2016-01-22 DIAGNOSIS — M79606 Pain in leg, unspecified: Secondary | ICD-10-CM | POA: Diagnosis not present

## 2016-01-22 LAB — FERRITIN: FERRITIN: 33.2 ng/mL (ref 10.0–291.0)

## 2016-01-22 NOTE — Assessment & Plan Note (Signed)
Chronically takes Ambien, 1 tablet at bedtime. Advised to wean off.

## 2016-01-22 NOTE — Patient Instructions (Addendum)
It was a pleasure taking care of you today!  Try  taking Robaxin, 1 tablet, at bedtime and see if that helps she would your legs. If your symptoms do not improve, pls call after a  week and we will start you on a medicine for RLS.   Return to clinic with Dr.Nestor as scheduled

## 2016-01-22 NOTE — Assessment & Plan Note (Addendum)
Pt is being seen for elevated PLMSI.   She has chronic asthma and reflux disease and there was concern for obstructive sleep apnea. A sleep lab study was done on 11/19/2015. Sleep study was reviewed. AHI was 0.7. Her PLMS index was elevated at 285.  Patient has fibromylagia x 7 yrs now. She takes OTC antiinflammatory meds daily. She takes Robaxin prn (q weekly) and Nabumetone 1-2x/week as needed. Her fibromyalgia is stable generally. Recent flares depending on stress.   Pt also has DDD in neck and back. Back issues cause aches in legs x 15 yrs. Everytime she lays down, the back hurts and this causes discomfort in legs. If she sits, her legs dont bother her as much.  Cool blankets and some movements make the aches better.   Clinically, she is not behaving like she has restless leg syndrome. Her legs discomfort and aches bother her whenever she is laying down, be in the morning at night. This is chronic. Fibromyalgia obviously makes this worse. She has tried sleeping on a chair and her legs don't ache or others much.  I extensively discussed with the patient and her husband regarding my thought process. I think, because of her fibromyalgia and neuropathy from her degenerative disc disease, she has constant chronic pain preventing her from having rested sleep at night. During the sleep study, she did not have deep sleep. She most likely had frequent awakenings from her leg issues.  Plan : 1. I asked her if she can take Robaxin at bedtime. We will see if this will help her get rested sleep. She will give this a week. If the legs are not better, she will call and most likely we'll start her on requip or mirapex.  2. Nonetheless, we will check a ferritin level. Sometimes with restless leg syndrome, if ferritin level is decreased, it can make restless leg syndrome worse. 3. We need to obtain her recent blood work.

## 2016-01-22 NOTE — Progress Notes (Addendum)
Subjective:    Patient ID: Christina Deleon, female    DOB: 09-16-1950, 65 y.o.   MRN: [id number]  HPI  Patient is being seen by Dr. Ashok Cordia for asthma, reflux disease. There was concern for sleep apnea so a lab study was done on 11/19/2015. Sleep study was reviewed. AHI was 0.7. Her PLMS index was elevated at 285. She was last seen by Dr. Ashok Cordia in July. During that visit, her asthma and reflux disease were stable. Asthma has been stable.  Her GERD is uncontrolled >> saw GI on 9/20 and meds were adjusted.   Patient has fibromylagia x 7 yrs now. She takes OTC antiinflammatory meds daily. She takes Robaxin prn (q weekly) and Nabumetone 1-2x/week as needed. Her fibromyalgia is stable generally. Recent flares depending on stress.   Pt also has DDD in neck and back. Back issues cause aches in legs x 15 yrs. Everytime she lays down, the back hurts and this causes discomfort in legs. If she sits, her legs dont bother her as much.  Cool blankets and some movements make the aches better.   Review of Systems  Constitutional: Negative.  Negative for fever and unexpected weight change.  HENT: Positive for congestion and rhinorrhea. Negative for dental problem, ear pain, nosebleeds, postnasal drip, sinus pressure, sneezing, sore throat and trouble swallowing.   Eyes: Negative.  Negative for redness and itching.  Respiratory: Positive for chest tightness, shortness of breath and wheezing. Negative for cough.   Cardiovascular: Positive for leg swelling. Negative for palpitations.  Gastrointestinal: Negative.  Negative for nausea and vomiting.  Endocrine: Negative.   Genitourinary: Negative.  Negative for dysuria.  Musculoskeletal: Positive for arthralgias and myalgias. Negative for joint swelling.  Skin: Negative for rash.  Allergic/Immunologic: Positive for environmental allergies.  Neurological: Positive for headaches.  Hematological: Negative.  Does not bruise/bleed easily.  Psychiatric/Behavioral:  Negative.  Negative for dysphoric mood. The patient is not nervous/anxious.     Past Medical History:  Diagnosis Date  . Adenomatous colon polyp 10/1997, and 03/2009  . Allergic rhinitis   . Allergy   . Arthritis    osteo  . Asthma   . Chronic gastritis   . Fibromyalgia   . GERD (gastroesophageal reflux disease)   . Hemorrhoids   . Hyperlipidemia   . Hypertension   . Menorrhagia    partial hysterectomy  . Migraines   . Sleep disturbance   . STD (sexually transmitted disease) 01/22/11 culture proven   HSV type I labia     Family History  Problem Relation Age of Onset  . Throat cancer Father   . Coronary artery disease Father   . Heart attack Father 29  . Stroke Father     during procedure  . Coronary artery disease Mother   . Heart failure Mother   . Hypertension Mother   . Hyperlipidemia Mother   . Pulmonary fibrosis Mother   . Lung disease Mother   . Prostate cancer Brother   . Heart disease Brother   . Diabetes Paternal Aunt     x 2  . Diabetes Paternal Grandmother   . Coronary artery disease Paternal Grandmother   . Stroke Maternal Grandmother     or MI  . Prostate cancer Paternal Uncle      Past Surgical History:  Procedure Laterality Date  . CATARACT EXTRACTION    . TONSILLECTOMY  age 40  . tooth extract     with bone graft  . Turbinate sinus surgery  2003  . [place]   secondary to prolapse, ovaries remain    Social History   Social History  . Marital status: Married    Spouse name: N/A  . Number of children: 2  . Years of education: N/A   Occupational History  . Retired      Metallurgist unemployed sincesince 6/10   Social History Main Topics  . Smoking status: Passive Smoke Exposure - Never Smoker  . Smokeless tobacco: Never Used     Comment: Father growing up  . Alcohol use No  . Drug use: No  . Sexual activity: Yes    Partners: Male    Birth control/ protection: Surgical     Comment:  hysterectomy   Other Topics Concern  . Not on file   Social History Narrative   She lives with husband.  They have 2 grown children.   She is retired Product/process development scientist.   Highest level of education:  2 years of college      Regular exercise, diet of fruits, veggies, limited fried foods, limited water      [facility]:   Originally from [place]. Has also lived in [place]. Previously worked doing Software engineer. No pets currently. No bird, mold, or hot tub exposure. Does have a musty smell in their home but it is new. Enjoys reading. 1 indoor plant. Has carpet in her home including in the bedroom. Also has draperies.      Allergies  Allergen Reactions  . Trazodone Hcl Other (See Comments)    REACTION: bad headaches  . Penicillins Nausea Only    Has patient had a PCN reaction causing immediate rash, facial/tongue/throat swelling, SOB or lightheadedness with hypotension: No Has patient had a PCN reaction causing severe rash involving mucus membranes or skin necrosis: No Has patient had a PCN reaction that required hospitalization No Has patient had a PCN reaction occurring within the last 10 years: No If all of the above answers are "NO", then may proceed with Cephalosporin use.      Outpatient Medications Prior to Visit  Medication Sig Dispense Refill  . albuterol (PROAIR HFA) 108 (90 BASE) MCG/ACT inhaler Inhale 2 puffs into the lungs every 6 (six) hours as needed for wheezing or shortness of breath. 1 Inhaler 1  . candesartan (ATACAND) 16 MG tablet Take 8 mg by mouth at bedtime.     . cetirizine (ZYRTEC) 10 MG tablet Take 1 tablet (10 mg total) by mouth daily. One tab daily for allergies 30 tablet 1  . cycloSPORINE (RESTASIS) 0.05 % ophthalmic emulsion Place 1 drop into both eyes 2 (two) times daily.    . fluticasone (FLONASE) 50 MCG/ACT nasal spray instill 2 sprays into each nostril once daily 16 g 3  . methocarbamol (ROBAXIN) 500 MG tablet Take 1  tablet (500 mg total) by mouth daily as needed for muscle spasms. 90 tablet 3  . mometasone (ASMANEX 60 METERED DOSES) 220 MCG/INH inhaler Inhale one dose twice daily to prevent cough or wheeze.  Rinse, gargle, and spit after use. 1 Inhaler 5  . montelukast (SINGULAIR) 10 MG tablet Take 1 tablet (10 mg total) by mouth at bedtime. 90 tablet 1  . Multiple Vitamin (MULTIVITAMIN) tablet Take 1 tablet by mouth daily. Calcium, Vitamin B-12, Vitamin A, Vitamin C    . nabumetone (RELAFEN) 500 MG tablet Take 500 mg by mouth 2 (two) times daily as needed for mild pain.    Marland Kitchen  omeprazole (PRILOSEC) 40 MG capsule Take 1 capsule (40 mg total) by mouth 2 (two) times daily. 180 capsule 3  . polyethylene glycol powder (GLYCOLAX/MIRALAX) powder Take 17 g by mouth daily. (Patient taking differently: Take 17 g by mouth daily as needed. ) 850 g 1  . pravastatin (PRAVACHOL) 20 MG tablet Take 20 mg by mouth daily.      . ranitidine (ZANTAC) 300 MG tablet Take 1 tablet (300 mg total) by mouth at bedtime. 30 tablet 11  . Vitamin D, Ergocalciferol, (DRISDOL) 50000 UNITS CAPS capsule Take 1 capsule (50,000 Units total) by mouth every 7 (seven) days. 30 capsule 3  . zolmitriptan (ZOMIG ZMT) 2.5 MG disintegrating tablet Take 1 tablet (2.5 mg total) by mouth as needed for migraine. 10 tablet 11  . zolpidem (AMBIEN) 5 MG tablet Take 5 mg by mouth at bedtime as needed for sleep.     No facility-administered medications prior to visit.    No orders of the defined types were placed in this encounter.        Objective:   Physical Exam   Vitals:  Vitals:   01/22/16 1011  BP: 132/78  Pulse: 85  SpO2: 96%  Weight: 170 lb (77.1 kg)  Height: 5\' 4"  (1.626 m)    Constitutional/General:  Pleasant, well-nourished, well-developed, not in any distress,  Comfortably seating.  Well kempt  Body mass index is 29.18 kg/m. Wt Readings from Last 3 Encounters:  01/22/16 170 lb (77.1 kg)  01/21/16 169 lb 3.2 oz (76.7 kg)    11/25/15 171 lb 12.8 oz (77.9 kg)       HEENT: Pupils equal and reactive to light and accommodation. Anicteric sclerae. Normal nasal mucosa.   No oral  lesions,  mouth clear,  oropharynx clear, no postnasal drip. (-) Oral thrush. No dental caries.  Airway - Mallampati class III  Neck: No masses. Midline trachea. No JVD, (-) LAD. (-) bruits appreciated.  Respiratory/Chest: Grossly normal chest. (-) deformity. (-) Accessory muscle use.  Symmetric expansion. (-) Tenderness on palpation.  Resonant on percussion.  Diminished BS on both lower lung zones. (-) wheezing, crackles, rhonchi (-) egophony  Cardiovascular: Regular rate and  rhythm, heart sounds normal, no murmur or gallops, no peripheral edema  Gastrointestinal:  Normal bowel sounds. Soft, non-tender. No hepatosplenomegaly.  (-) masses.   Musculoskeletal:  Normal muscle tone. Normal gait.   Extremities: Grossly normal. (-) clubbing, cyanosis.  (-) edema  Skin: (-) rash,lesions seen.   Neurological/Psychiatric : alert, oriented to time, place, person. Normal mood and affect         Assessment & Plan:  Leg pain Pt is being seen for elevated PLMSI.   She has chronic asthma and reflux disease and there was concern for obstructive sleep apnea. A sleep lab study was done on 11/19/2015. Sleep study was reviewed. AHI was 0.7. Her PLMS index was elevated at 285.  Patient has fibromylagia x 7 yrs now. She takes OTC antiinflammatory meds daily. She takes Robaxin prn (q weekly) and Nabumetone 1-2x/week as needed. Her fibromyalgia is stable generally. Recent flares depending on stress.   Pt also has DDD in neck and back. Back issues cause aches in legs x 15 yrs. Everytime she lays down, the back hurts and this causes discomfort in legs. If she sits, her legs dont bother her as much.  Cool blankets and some movements make the aches better.   Clinically, she is not behaving like she has restless leg syndrome. Her legs  discomfort and aches bother her whenever she is laying down, be in the morning at night. This is chronic. Fibromyalgia obviously makes this worse. She has tried sleeping on a chair and her legs don't ache or others much.  I extensively discussed with the patient and her husband regarding my thought process. I think, because of her fibromyalgia and neuropathy from her degenerative disc disease, she has constant chronic pain preventing her from having rested sleep at night. During the sleep study, she did not have deep sleep. She most likely had frequent awakenings from her leg issues.  Plan : 1. I asked her if she can take Robaxin at bedtime. We will see if this will help her get rested sleep. She will give this a week. If the legs are not better, she will call and most likely we'll start her on requip or mirapex.  2. Nonetheless, we will check a ferritin level. Sometimes with restless leg syndrome, if ferritin level is decreased, it can make restless leg syndrome worse. 3. We need to obtain her recent blood work.  Restless legs syndrome As discussed in "leg pain" Try robaxin. Will try dopamine agents is robaxin does not help.  Addendum : cbc and BMP were N (01/15/16)  Insomnia Chronically takes Ambien, 1 tablet at bedtime. Advised to wean off.  Return to clinic as needed. We will touch base with her. She has follow up  with Dr. Ashok Cordia.  Monica Becton, MD 01/22/2016, 12:34 PM [facility] Pager [phone] After 3 pm or if no answer, call [phone]

## 2016-01-22 NOTE — Assessment & Plan Note (Addendum)
As discussed in "leg pain" Try robaxin. Will try dopamine agents is robaxin does not help.  Addendum : cbc and BMP were N (01/15/16)

## 2016-01-23 ENCOUNTER — Ambulatory Visit: Payer: Medicare Other | Admitting: Nurse Practitioner

## 2016-02-18 ENCOUNTER — Ambulatory Visit (INDEPENDENT_AMBULATORY_CARE_PROVIDER_SITE_OTHER): Payer: Medicare Other | Admitting: Nurse Practitioner

## 2016-02-18 ENCOUNTER — Encounter: Payer: Self-pay | Admitting: Pulmonary Disease

## 2016-02-18 ENCOUNTER — Encounter: Payer: Self-pay | Admitting: Nurse Practitioner

## 2016-02-18 VITALS — BP 114/76 | HR 56 | Ht 64.0 in | Wt 171.0 lb

## 2016-02-18 DIAGNOSIS — Z01419 Encounter for gynecological examination (general) (routine) without abnormal findings: Secondary | ICD-10-CM

## 2016-02-18 DIAGNOSIS — Z124 Encounter for screening for malignant neoplasm of cervix: Secondary | ICD-10-CM | POA: Diagnosis not present

## 2016-02-18 NOTE — Progress Notes (Signed)
Patient ID: Christina Deleon, female   DOB: 07-28-1950, 65 y.o.   MRN: [id number]  65 y.o. G2P2002 Married  African American Fe here for annual exam.  Right cataract eye surgery 10/16 and left done 11/16.  She is caring for elderly mother by traveling  3 hrs away 2-3 times a month.  Patient's last menstrual period was 07/02/1994.          Sexually active: Yes.    The current method of family planning is status post hysterectomy.    Exercising: No.  The patient does not participate in regular exercise at present. Smoker:  no  Health Maintenance: Pap:  11/23/01, Negative (hysterectomy) MMG:  01/31/15 Bi-Rads C 1 neg  Colonoscopy:  03/08/14 normal - previous tubular adenoma, repeat 5 years BMD:   04/11/14 T Score: spine 0.5; right hip neck -1.5; left hip neck -1.5 TDaP:  11/20/2009 Shingles: has not had Pneumonia: thinks done at PCP/ pulmonology Hep C: PCP 2017 Labs: PCP takes care of all labs   reports that she is a non-smoker but has been exposed to tobacco smoke. She has never used smokeless tobacco. She reports that she does not drink alcohol or use drugs.  Past Medical History:  Diagnosis Date  . Adenomatous colon polyp 10/1997, and 03/2009  . Allergic rhinitis   . Allergy   . Arthritis    osteo  . Asthma   . Chronic gastritis   . Fibromyalgia   . GERD (gastroesophageal reflux disease)   . Hemorrhoids   . Hyperlipidemia   . Hypertension   . Menorrhagia    partial hysterectomy  . Migraines   . Sleep disturbance   . 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 CATARACT EXTRACTION    . TONSILLECTOMY  age 59  . tooth extract     with bone graft  . Turbinate sinus surgery  2003  . VAGINAL HYSTERECTOMY  1998   secondary to prolapse, ovaries remain    Current Outpatient Prescriptions  Medication Sig Dispense Refill  . albuterol (PROAIR HFA) 108 (90 BASE) MCG/ACT inhaler Inhale 2 puffs  into the lungs every 6 (six) hours as needed for wheezing or shortness of breath. 1 Inhaler 1  . candesartan (ATACAND) 16 MG tablet Take 8 mg by mouth at bedtime.     . cetirizine (ZYRTEC) 10 MG tablet Take 1 tablet (10 mg total) by mouth daily. One tab daily for allergies 30 tablet 1  . cycloSPORINE (RESTASIS) 0.05 % ophthalmic emulsion Place 1 drop into both eyes 2 (two) times daily.    . fluticasone (FLONASE) 50 MCG/ACT nasal spray instill 2 sprays into each nostril once daily 16 g 3  . methocarbamol (ROBAXIN) 500 MG tablet Take 1 tablet (500 mg total) by mouth daily as needed for muscle spasms. 90 tablet 3  . mometasone (ASMANEX 60 METERED DOSES) 220 MCG/INH inhaler Inhale one dose twice daily to prevent cough or wheeze.  Rinse, gargle, and spit after use. 1 Inhaler 5  . montelukast (SINGULAIR) 10 MG tablet Take 1 tablet (10 mg total) by mouth at bedtime. 90 tablet 1  . Multiple Vitamin (MULTIVITAMIN) tablet Take 1 tablet by mouth daily. Calcium, Vitamin B-12, Vitamin A, Vitamin C    . nabumetone (RELAFEN) 500 MG tablet Take 500 mg by mouth 2 (two) times daily as needed for mild pain.    Marland Kitchen omeprazole (PRILOSEC) 40  MG capsule Take 1 capsule (40 mg total) by mouth 2 (two) times daily. 180 capsule 3  . polyethylene glycol powder (GLYCOLAX/MIRALAX) powder Take 17 g by mouth daily. (Patient taking differently: Take 17 g by mouth daily as needed. ) 850 g 1  . pravastatin (PRAVACHOL) 20 MG tablet Take 20 mg by mouth daily.      . ranitidine (ZANTAC) 300 MG tablet Take 1 tablet (300 mg total) by mouth at bedtime. 30 tablet 11  . Vitamin D, Ergocalciferol, (DRISDOL) 50000 UNITS CAPS capsule Take 1 capsule (50,000 Units total) by mouth every 7 (seven) days. 30 capsule 3  . zolmitriptan (ZOMIG ZMT) 2.5 MG disintegrating tablet Take 1 tablet (2.5 mg total) by mouth as needed for migraine. 10 tablet 11  . zolpidem (AMBIEN) 5 MG tablet Take 5 mg by mouth at bedtime as needed for sleep.     No current  facility-administered medications for this visit.     Family History  Problem Relation Age of Onset  . Throat cancer Father   . Coronary artery disease Father   . Heart attack Father 28  . Stroke Father     during procedure  . Coronary artery disease Mother   . Heart failure Mother   . Hypertension Mother   . Hyperlipidemia Mother   . Pulmonary fibrosis Mother   . Lung disease Mother   . Prostate cancer Brother   . Heart disease Brother   . Diabetes Paternal Aunt     x 2  . Diabetes Paternal Grandmother   . Coronary artery disease Paternal Grandmother   . Stroke Maternal Grandmother     or MI  . Prostate cancer Paternal Uncle     ROS:  Pertinent items are noted in HPI.  Otherwise, a comprehensive ROS was negative.  Exam:   BP 114/76 (BP Location: Right Arm, Patient Position: Sitting, Cuff Size: Normal)   Pulse (!) 56   Ht 5\' 4"  (1.626 m)   Wt 171 lb (77.6 kg)   LMP 07/02/1994   BMI 29.35 kg/m  Height: 5\' 4"  (162.6 cm) Ht Readings from Last 3 Encounters:  02/18/16 5\' 4"  (1.626 m)  01/22/16 5\' 4"  (1.626 m)  01/21/16 5\' 4"  (1.626 m)    General appearance: alert, cooperative and appears stated age Head: Normocephalic, without obvious abnormality, atraumatic Neck: no adenopathy, supple, symmetrical, trachea midline and thyroid normal to inspection and palpation Lungs: clear to auscultation bilaterally Breasts: normal appearance, no masses or tenderness Heart: regular rate and rhythm Abdomen: soft, non-tender; no masses,  no organomegaly Extremities: extremities normal, atraumatic, no cyanosis or edema Skin: Skin color, texture, turgor normal. No rashes or lesions Lymph nodes: Cervical, supraclavicular, and axillary nodes normal. No abnormal inguinal nodes palpated Neurologic: Grossly normal   Pelvic: External genitalia:  no lesions              Urethra:  normal appearing urethra with no masses, tenderness or lesions              Bartholin's and Skene's: normal                  Vagina: normal appearing vagina with normal color and discharge, no lesions              Cervix: absent              Pap taken: No. Bimanual Exam:  Uterus:  uterus absent  Adnexa: no mass, fullness, tenderness               Rectovaginal: Confirms               Anus:  normal sphincter tone, no lesions  Chaperone present: no  A:  Well Woman with normal exam        S/P TVH secondary to prolapse, ovaries remain 1996  Vit D deficiency  History of fibromyalgia, GERD, back pain  History of HSV - genital  Atrophic vaginitis Flare of asthma this year  Situational anxiety ans stressors   P:   Reviewed health and wellness pertinent to exam  Pap smear as above  Mammogram is due now and will schedule  Encouragement given  Counseled on breast self exam, mammography screening, adequate intake of calcium and vitamin D, diet and exercise, Kegel's exercises return annually or prn  An After Visit Summary was printed and given to the patient.

## 2016-02-18 NOTE — Patient Instructions (Signed)

## 2016-02-22 NOTE — Progress Notes (Signed)
Encounter reviewed by Dr. Swan Zayed Amundson C. Silva.  

## 2016-02-27 DIAGNOSIS — H1013 Acute atopic conjunctivitis, bilateral: Secondary | ICD-10-CM | POA: Diagnosis not present

## 2016-02-27 DIAGNOSIS — H04123 Dry eye syndrome of bilateral lacrimal glands: Secondary | ICD-10-CM | POA: Diagnosis not present

## 2016-02-27 DIAGNOSIS — M3501 Sicca syndrome with keratoconjunctivitis: Secondary | ICD-10-CM | POA: Diagnosis not present

## 2016-02-27 DIAGNOSIS — H2000 Unspecified acute and subacute iridocyclitis: Secondary | ICD-10-CM | POA: Diagnosis not present

## 2016-03-15 ENCOUNTER — Encounter: Payer: Self-pay | Admitting: Pulmonary Disease

## 2016-03-15 ENCOUNTER — Ambulatory Visit (INDEPENDENT_AMBULATORY_CARE_PROVIDER_SITE_OTHER): Payer: Medicare Other | Admitting: Pulmonary Disease

## 2016-03-15 VITALS — BP 122/80 | HR 81 | Ht 64.0 in | Wt 168.0 lb

## 2016-03-15 DIAGNOSIS — K219 Gastro-esophageal reflux disease without esophagitis: Secondary | ICD-10-CM | POA: Diagnosis not present

## 2016-03-15 DIAGNOSIS — J309 Allergic rhinitis, unspecified: Secondary | ICD-10-CM

## 2016-03-15 DIAGNOSIS — J4530 Mild persistent asthma, uncomplicated: Secondary | ICD-10-CM

## 2016-03-15 LAB — PULMONARY FUNCTION TEST
FEF 25-75 Post: 1.74 L/sec
FEF 25-75 Pre: 1.54 L/sec
FEF2575-%CHANGE-POST: 13 %
FEF2575-%PRED-POST: 94 %
FEF2575-%Pred-Pre: 83 %
FEV1-%CHANGE-POST: 2 %
FEV1-%PRED-POST: 96 %
FEV1-%Pred-Pre: 93 %
FEV1-Post: 1.87 L
FEV1-Pre: 1.82 L
FEV1FVC-%CHANGE-POST: 0 %
FEV1FVC-%PRED-PRE: 100 %
FEV6-%Change-Post: 3 %
FEV6-%PRED-POST: 100 %
FEV6-%Pred-Pre: 97 %
FEV6-PRE: 2.33 L
FEV6-Post: 2.41 L
FEV6FVC-%Pred-Post: 104 %
FEV6FVC-%Pred-Pre: 104 %
FVC-%CHANGE-POST: 3 %
FVC-%PRED-PRE: 93 %
FVC-%Pred-Post: 96 %
FVC-POST: 2.41 L
FVC-PRE: 2.33 L
POST FEV1/FVC RATIO: 78 %
PRE FEV6/FVC RATIO: 100 %
Post FEV6/FVC ratio: 100 %
Pre FEV1/FVC ratio: 78 %

## 2016-03-15 NOTE — Progress Notes (Signed)
Test reviewed.  

## 2016-03-15 NOTE — Patient Instructions (Signed)
   Continue using your inhalers and medications as prescribed.  Try switching from Zyrtec over to Claritin or Allegra to help with your sinuses and your breathing.  Call me if you have any new breathing problems or questions before your next appointment.  TESTS ORDERED: 1. Spirometry with bronchodilator challenge at next appointment

## 2016-03-15 NOTE — Progress Notes (Signed)
Subjective:    Patient ID: Christina Deleon, female    DOB: 1950-05-14, 65 y.o.   MRN: [id number]  C.C.:  Follow-up for Mild, Persistent Asthma, Chronic Hypoxic Respiratory Failure, GERD, & Allergic Rhinitis.  HPI  Mild, Persistent Asthma:  Currently on Asmanex bid & Singulair. Denies any new breathing problems. She reports intermittent coughing & wheezing depending on activity & environment maybe once or twice a month. No exacerbations since last appointment. She is using her rescue medication once a month. She reports her peak flow is ranging from 400-450.  GERD:  Follows with GI and did have recent follow-up.  On Zantac & Prilosec. She reports she did have more problems with her reflux last night and even this morning. She is now taking Prilosec bid.   Allergic Rhinitis:  On Flonase, Singulair, & Zyrtec. She is still having having intermittent sinus congestion & drainage.   Review of Systems No fever, chills, or sweats. No chest pain or pressure. No headaches or vision changes out of her normal.   Allergies  Allergen Reactions  . Trazodone Hcl Other (See Comments)    REACTION: bad headaches  . Penicillins Nausea Only    Has patient had a PCN reaction causing immediate rash, facial/tongue/throat swelling, SOB or lightheadedness with hypotension: No Has patient had a PCN reaction causing severe rash involving mucus membranes or skin necrosis: No Has patient had a PCN reaction that required hospitalization No Has patient had a PCN reaction occurring within the last 10 years: No If all of the above answers are "NO", then may proceed with Cephalosporin use.     Current Outpatient Prescriptions on File Prior to Visit  Medication Sig Dispense Refill  . albuterol (PROAIR HFA) 108 (90 BASE) MCG/ACT inhaler Inhale 2 puffs into the lungs every 6 (six) hours as needed for wheezing or shortness of breath. 1 Inhaler 1  . candesartan (ATACAND) 16 MG tablet Take 8 mg by mouth at bedtime.     .  cetirizine (ZYRTEC) 10 MG tablet Take 1 tablet (10 mg total) by mouth daily. One tab daily for allergies 30 tablet 1  . cycloSPORINE (RESTASIS) 0.05 % ophthalmic emulsion Place 1 drop into both eyes 2 (two) times daily.    . fluticasone (FLONASE) 50 MCG/ACT nasal spray instill 2 sprays into each nostril once daily 16 g 3  . methocarbamol (ROBAXIN) 500 MG tablet Take 1 tablet (500 mg total) by mouth daily as needed for muscle spasms. 90 tablet 3  . mometasone (ASMANEX 60 METERED DOSES) 220 MCG/INH inhaler Inhale one dose twice daily to prevent cough or wheeze.  Rinse, gargle, and spit after use. 1 Inhaler 5  . montelukast (SINGULAIR) 10 MG tablet Take 1 tablet (10 mg total) by mouth at bedtime. (Patient taking differently: Take 10 mg by mouth daily. ) 90 tablet 1  . Multiple Vitamin (MULTIVITAMIN) tablet Take 1 tablet by mouth daily. Calcium, Vitamin B-12, Vitamin A, Vitamin C    . nabumetone (RELAFEN) 500 MG tablet Take 500 mg by mouth 2 (two) times daily as needed for mild pain.    Marland Kitchen omeprazole (PRILOSEC) 40 MG capsule Take 1 capsule (40 mg total) by mouth 2 (two) times daily. 180 capsule 3  . polyethylene glycol powder (GLYCOLAX/MIRALAX) powder Take 17 g by mouth daily. (Patient taking differently: Take 17 g by mouth daily as needed. ) 850 g 1  . pravastatin (PRAVACHOL) 20 MG tablet Take 20 mg by mouth daily.      Marland Kitchen  ranitidine (ZANTAC) 300 MG tablet Take 1 tablet (300 mg total) by mouth at bedtime. 30 tablet 11  . Vitamin D, Ergocalciferol, (DRISDOL) 50000 UNITS CAPS capsule Take 1 capsule (50,000 Units total) by mouth every 7 (seven) days. 30 capsule 3  . zolmitriptan (ZOMIG ZMT) 2.5 MG disintegrating tablet Take 1 tablet (2.5 mg total) by mouth as needed for migraine. 10 tablet 11  . zolpidem (AMBIEN) 5 MG tablet Take 5 mg by mouth at bedtime as needed for sleep.    . [DISCONTINUED] dexlansoprazole (DEXILANT) 60 MG capsule Take 60 mg by mouth daily.       No current facility-administered  medications on file prior to visit.     Past Medical History:  Diagnosis Date  . Adenomatous colon polyp 10/1997, and 03/2009  . Allergic rhinitis   . Allergy   . Arthritis    osteo  . Asthma   . Chronic gastritis   . Fibromyalgia   . GERD (gastroesophageal reflux disease)   . Hemorrhoids   . Hyperlipidemia   . Hypertension   . Menorrhagia    partial hysterectomy  . Migraines   . Sleep disturbance   . 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 CATARACT EXTRACTION Bilateral 10/16 & 11/16  . TONSILLECTOMY  age 24  . tooth extract     with bone graft  . Turbinate sinus surgery  2003  . VAGINAL HYSTERECTOMY  1998   secondary to prolapse, ovaries remain    Family History  Problem Relation Age of Onset  . Throat cancer Father   . Coronary artery disease Father   . Heart attack Father 6  . Stroke Father     during procedure  . Coronary artery disease Mother   . Heart failure Mother   . Hypertension Mother   . Hyperlipidemia Mother   . Pulmonary fibrosis Mother   . Lung disease Mother   . Prostate cancer Brother   . Heart disease Brother   . Diabetes Paternal Aunt     x 2  . Diabetes Paternal Grandmother   . Coronary artery disease Paternal Grandmother   . Stroke Maternal Grandmother     or MI  . Prostate cancer Paternal Uncle     Social History   Social History  . Marital status: Married    Spouse name: N/A  . Number of children: 2  . Years of education: N/A   Occupational History  . Retired      Metallurgist unemployed sincesince 6/10   Social History Main Topics  . Smoking status: Passive Smoke Exposure - Never Smoker  . Smokeless tobacco: Never Used     Comment: Father growing up  . Alcohol use No  . Drug use: No  . Sexual activity: Yes    Partners: Male    Birth control/ protection: Surgical     Comment: hysterectomy   Other  Topics Concern  . None   Social History Narrative   She lives with husband.  They have 2 grown children.   She is retired Product/process development scientist.   Highest level of education:  2 years of college      Regular exercise, diet of fruits, veggies, limited fried foods, limited water      [facility]:   Originally from [place]. Has also lived in [place]. Previously worked doing Software engineer. No pets  currently. No bird, mold, or hot tub exposure. Does have a musty smell in their home but it is new. Enjoys reading. 1 indoor plant. Has carpet in her home including in the bedroom. Also has draperies.       Objective:   Physical Exam BP 122/80 (BP Location: Left Arm, Cuff Size: Normal)   Pulse 81   Ht 5\' 4"  (1.626 m)   Wt 168 lb (76.2 kg)   LMP 07/02/1994   SpO2 96%   BMI 28.84 kg/m  General:  Awake. Husband with her today. Well-dressed. Integument:  Warm & dry. No rash on exposed skin.  Lymphatics:  No appreciated cervical or supraclavicular lymphadenoapthy. HEENT:  Mild bilateral nasal turbinate swelling. No oral ulcers. Moist mucous membranes. Cardiovascular:  Regular rate and rhythm. No edema. Normal S1 & S2.  Pulmonary:  Good aeration bilaterally as well as clear to auscultation. Normal work of breathing on room air. Abdomen: Soft. Normal bowel sounds. Nontender.  PFT 03/15/16: FVC 2.33 L (93%) FEV1 1.18 L (93%) FEV1/FVC 0.78 FEF 25-75 1.54 L (83%) negative bronchodilator response 11/19/15: FVC 2.54 L (101%) FEV1 2.10 L (107%) FEV1/FVC 0.83 FEF 25-75 2.16 L (116%) negative bronchodilator response TLC 4.68 L (92%) RV 100% ERV 115% DLCO uncorrected 71% 09/23/15: FVC 2.20 L (92%) FEV1 1.82 L (96%) FEV1/FVC 0.82 FEF 25-75 2.00 L (89%)  6MWT 11/25/15:  Walked 512 meters / Baseline Sat 99% on RA / Nadir Sat 99% on RA (complained of dizziness, hip pain, & calf pain at end of walk)  POLYSOMNOGRAM (11/17/15): AHI 0.7 events/hour. Lower saturation 92%. Loud snoring  noted. Periodic limb movement index to 85.38. Arousal associated with leg movement index was 26.3. Recommended assessment for restless leg syndrome.  IMAGING CXR PA/LAT 09/05/15 (previously reviewed by me): No parenchymal nodule or opacity appreciated. Heart normal in size & mediastinum normal in contour. No pleural effusion.  LABS 10/08/15 IgE: 149 RAST Panel: Negative    Assessment & Plan:  65 y.o. female with underlying mild, persistent asthma. Symptomatically the patient's asthma seems to be reasonably controlled but with her decline in spirometry today I do feel that de-escalating her inhaled steroid therapy would be ill advised. With ongoing symptoms from her allergic rhinitis I do feel that switching up her antihistamine regimen may be of great benefit to not only her allergies and sinuses but also her underlying asthma. She does continue to have intermittent symptoms from her reflux which is complicating things further but I will defer to GI on further medication adjustments. I instructed the patient contact my office if she had any new breathing problems or questions before next appointment.  1. Mild, Persistent Asthma: Continuing Singulair & Asmanex as prescribed. Repeat spirometry with bronchodilator challenge at next appointment and consider de-escalation of inhaled steroid therapy. 2. GERD: Currently on twice a day Prilosec & Zantac followed by GI. No changes at this time. 3. Allergic Rhinitis:  Recommended transitioning from Zyrtec to Claritin or Allegra temporarily.  4. Health Maintenance:  S/P Influenza Vaccine September 2017, Prevnar Vaccine July 2017, Pneumovax July 2015, & Tdap July 2011.  5. Follow-up: Patient to return to clinic in 6 months or sooner if needed.  Sonia Baller Ashok Cordia, M.D. [facility] Pager:  [phone] After 3pm or if no response, call [phone] 10:27 AM 03/15/16

## 2016-03-18 DIAGNOSIS — Z1231 Encounter for screening mammogram for malignant neoplasm of breast: Secondary | ICD-10-CM | POA: Diagnosis not present

## 2016-03-22 DIAGNOSIS — R21 Rash and other nonspecific skin eruption: Secondary | ICD-10-CM | POA: Diagnosis not present

## 2016-03-22 DIAGNOSIS — J4530 Mild persistent asthma, uncomplicated: Secondary | ICD-10-CM | POA: Diagnosis not present

## 2016-03-22 DIAGNOSIS — R600 Localized edema: Secondary | ICD-10-CM | POA: Diagnosis not present

## 2016-04-16 ENCOUNTER — Telehealth: Payer: Self-pay | Admitting: Rheumatology

## 2016-04-16 ENCOUNTER — Other Ambulatory Visit: Payer: Self-pay | Admitting: *Deleted

## 2016-04-16 ENCOUNTER — Other Ambulatory Visit: Payer: Self-pay | Admitting: Rheumatology

## 2016-04-16 NOTE — Telephone Encounter (Signed)
-----   Message from Carole Binning, LPN sent at [phone] 12:06 PM EST ----- Regarding: Please schedule patient for follow up Please schedule patient for follow up. Patient due February 2018

## 2016-04-16 NOTE — Telephone Encounter (Signed)
Last Visit: 12/15/15 Next Visit due February 2018. Message sent to front to schedule patient.  Labs: 12/11/15 WNL  Okay to refill Ambien?

## 2016-04-16 NOTE — Telephone Encounter (Signed)
LMOM for patient to call back to schedule appointment.  

## 2016-04-19 ENCOUNTER — Encounter: Payer: Self-pay | Admitting: Rheumatology

## 2016-04-19 ENCOUNTER — Other Ambulatory Visit: Payer: Self-pay | Admitting: *Deleted

## 2016-04-19 ENCOUNTER — Ambulatory Visit (INDEPENDENT_AMBULATORY_CARE_PROVIDER_SITE_OTHER): Payer: Medicare Other | Admitting: Rheumatology

## 2016-04-19 VITALS — BP 128/76 | HR 76 | Resp 16 | Ht 64.0 in | Wt 170.0 lb

## 2016-04-19 DIAGNOSIS — M533 Sacrococcygeal disorders, not elsewhere classified: Secondary | ICD-10-CM

## 2016-04-19 DIAGNOSIS — M19041 Primary osteoarthritis, right hand: Secondary | ICD-10-CM

## 2016-04-19 DIAGNOSIS — M722 Plantar fascial fibromatosis: Secondary | ICD-10-CM | POA: Diagnosis not present

## 2016-04-19 DIAGNOSIS — M19042 Primary osteoarthritis, left hand: Secondary | ICD-10-CM

## 2016-04-19 DIAGNOSIS — M5030 Other cervical disc degeneration, unspecified cervical region: Secondary | ICD-10-CM

## 2016-04-19 DIAGNOSIS — G8929 Other chronic pain: Secondary | ICD-10-CM

## 2016-04-19 DIAGNOSIS — M170 Bilateral primary osteoarthritis of knee: Secondary | ICD-10-CM | POA: Diagnosis not present

## 2016-04-19 DIAGNOSIS — M797 Fibromyalgia: Secondary | ICD-10-CM | POA: Diagnosis not present

## 2016-04-19 DIAGNOSIS — M542 Cervicalgia: Secondary | ICD-10-CM

## 2016-04-19 DIAGNOSIS — E559 Vitamin D deficiency, unspecified: Secondary | ICD-10-CM | POA: Diagnosis not present

## 2016-04-19 DIAGNOSIS — M47816 Spondylosis without myelopathy or radiculopathy, lumbar region: Secondary | ICD-10-CM | POA: Diagnosis not present

## 2016-04-19 DIAGNOSIS — M47812 Spondylosis without myelopathy or radiculopathy, cervical region: Secondary | ICD-10-CM

## 2016-04-19 DIAGNOSIS — R5383 Other fatigue: Secondary | ICD-10-CM | POA: Diagnosis not present

## 2016-04-19 MED ORDER — LIDOCAINE HCL 1 % IJ SOLN
0.5000 mL | INTRAMUSCULAR | Status: AC | PRN
  Administered 2016-04-19: .5 mL

## 2016-04-19 MED ORDER — LIDOCAINE HCL 1 % IJ SOLN
1.0000 mL | INTRAMUSCULAR | Status: AC | PRN
  Administered 2016-04-19: 1 mL

## 2016-04-19 MED ORDER — TRIAMCINOLONE ACETONIDE 40 MG/ML IJ SUSP
40.0000 mg | INTRAMUSCULAR | Status: AC | PRN
  Administered 2016-04-19: 40 mg via INTRA_ARTICULAR

## 2016-04-19 MED ORDER — TRIAMCINOLONE ACETONIDE 40 MG/ML IJ SUSP
10.0000 mg | INTRAMUSCULAR | Status: AC | PRN
  Administered 2016-04-19: 10 mg via INTRAMUSCULAR

## 2016-04-19 NOTE — Progress Notes (Signed)
Office Visit Note  Patient: Christina Deleon             Date of Birth: [birth year]           MRN: [id number]             PCP: Kelton Pillar, MD Referring: Lanice Shirts, * Visit Date: 04/19/2016 Occupation: Retired, regulatory compliance specialist    Subjective:  Neck pain   History of Present Illness: Christina Deleon is a 65 y.o. female with history of fibromyalgia syndrome and disc disease. She states that she's been having  pain and discomfort in the neck and lower back region. She also has some discomfort in the bottom of her bilateral feet but worse in the right foot .It happens when she stands up and walks. She continues to have some discomfort in her knee joints and has difficulty coming down the stairs.  Activities of Daily Living:  Patient reports morning stiffness for 30 minutes.   Patient Reports nocturnal pain.  Difficulty dressing/grooming: Denies Difficulty climbing stairs: Reports Difficulty getting out of chair: Denies Difficulty using hands for taps, buttons, cutlery, and/or writing: Reports   Review of Systems  Constitutional: Positive for fatigue. Negative for night sweats, weight gain, weight loss and weakness.  HENT: Negative for mouth sores, trouble swallowing, trouble swallowing, mouth dryness and nose dryness.   Eyes: Positive for dryness. Negative for pain, redness and visual disturbance.  Respiratory: Negative for cough, shortness of breath and difficulty breathing.   Cardiovascular: Negative for chest pain, palpitations, hypertension, irregular heartbeat and swelling in legs/feet.  Gastrointestinal: Positive for constipation. Negative for blood in stool and diarrhea.  Endocrine: Negative for increased urination.  Genitourinary: Negative for vaginal dryness.  Musculoskeletal: Positive for arthralgias, joint pain, myalgias, morning stiffness and myalgias. Negative for joint swelling, muscle weakness and muscle tenderness.  Skin: Negative for color  change, rash, hair loss, skin tightness, ulcers and sensitivity to sunlight.  Allergic/Immunologic: Negative for susceptible to infections.  Neurological: Negative for dizziness, memory loss and night sweats.  Hematological: Negative for swollen glands.  Psychiatric/Behavioral: Positive for sleep disturbance. Negative for depressed mood. The patient is nervous/anxious.     PMFS History:  Patient Active Problem List   Diagnosis Date Noted  . Fibromyalgia 04/19/2016  . Other fatigue 04/19/2016  . 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 Primary osteoarthritis of both knees 04/19/2016  . Leg pain 01/22/2016  . Restless legs syndrome 01/22/2016  . Snoring 11/19/2015  . Periodic limb movement sleep disorder 11/19/2015  . GERD (gastroesophageal reflux disease) 10/08/2015  . Mild persistent asthma 04/09/2015  . LPRD (laryngopharyngeal reflux disease) 04/09/2015  . Allergic rhinoconjunctivitis 04/09/2015  . HEMATOCHEZIA 04/01/2010  . COUGH 04/01/2010  . GASTRITIS 09/30/2009  . NAUSEA 09/30/2009  . ABDOMINAL PAIN-EPIGASTRIC 09/30/2009  . DYSTHYMIC DISORDER 09/26/2009  . PALPITATIONS 06/17/2009  . SHOULDER IMPINGEMENT SYNDROME, LEFT 06/04/2009  . CONSTIPATION 02/05/2009  . PERSONAL HX COLONIC POLYPS 02/05/2009  . ASTHMA, PERSISTENT, MILD 02/04/2009  . CHEST PAIN, ATYPICAL 02/04/2009  . COLONIC POLYPS, BENIGN 12/18/2008  . HYPERLIPIDEMIA 12/18/2008  . Insomnia 12/18/2008  . MIGRAINE, COMMON 12/18/2008  . HYPERTENSION 12/18/2008  . ALLERGIC RHINITIS 12/18/2008  . GERD 12/18/2008  . Primary osteoarthritis of both hands 12/18/2008  . DJD (degenerative joint disease), cervical 12/18/2008  . NECK PAIN, CHRONIC 12/18/2008  . LOW BACK PAIN, CHRONIC 12/18/2008    Past Medical History:  Diagnosis Date  . Adenomatous colon polyp 10/1997, and 03/2009  . Allergic rhinitis   .  Allergy   . Arthritis    osteo  . Asthma   . Chronic gastritis   . Fibromyalgia   . GERD (gastroesophageal reflux  disease)   . Hemorrhoids   . Hyperlipidemia   . Hypertension   . Menorrhagia    partial hysterectomy  . Migraines   . Sleep disturbance   . STD (sexually transmitted disease) 01/22/11 culture proven   HSV type I labia    Family History  Problem Relation Age of Onset  . Throat cancer Father   . Coronary artery disease Father   . Heart attack Father 14  . Stroke Father     during procedure  . Coronary artery disease Mother   . Heart failure Mother   . Hypertension Mother   . Hyperlipidemia Mother   . Pulmonary fibrosis Mother   . Lung disease Mother   . Prostate cancer Brother   . Heart disease Brother   . Diabetes Paternal Aunt     x 2  . Diabetes Paternal Grandmother   . Coronary artery disease Paternal Grandmother   . Stroke Maternal Grandmother     or MI  . Prostate cancer Paternal Uncle    Past Surgical History:  Procedure Laterality Date  . CATARACT EXTRACTION Bilateral 10/16 & 11/16  . TONSILLECTOMY  age 66  . tooth extract     with bone graft  . Turbinate sinus surgery  2003  . VAGINAL HYSTERECTOMY  1998   secondary to prolapse, ovaries remain   Social History   Social History Narrative   She lives with husband.  They have 2 grown children.   She is retired Product/process development scientist.   Highest level of education:  2 years of college      Regular exercise, diet of fruits, veggies, limited fried foods, limited water      [facility]:   Originally from [place]. Has also lived in [place]. Previously worked doing Software engineer. No pets currently. No bird, mold, or hot tub exposure. Does have a musty smell in their home but it is new. Enjoys reading. 1 indoor plant. Has carpet in her home including in the bedroom. Also has draperies.      Objective: Vital Signs: BP 128/76   Pulse 76   Resp 16   Ht 5\' 4"  (1.626 m)   Wt 170 lb (77.1 kg)   LMP 07/02/1994   BMI 29.18 kg/m    Physical Exam  Constitutional: She is oriented to person,  place, and time. She appears well-developed and well-nourished.  HENT:  Head: Normocephalic and atraumatic.  Eyes: Conjunctivae and EOM are normal.  Neck: Normal range of motion.  Cardiovascular: Normal rate, regular rhythm, normal heart sounds and intact distal pulses.   Pulmonary/Chest: Effort normal and breath sounds normal.  Abdominal: Soft. Bowel sounds are normal.  Lymphadenopathy:    She has no cervical adenopathy.  Neurological: She is alert and oriented to person, place, and time.  Skin: Skin is warm and dry. Capillary refill takes less than 2 seconds.  Psychiatric: She has a normal mood and affect. Her behavior is normal.  Nursing note and vitals reviewed.    Musculoskeletal Exam: C-spine, thoracic, lumbar spine good range of motion. She has tenderness over bilateral trapezius area. Tenderness over left SI joint. Shoulder joints elbow joints wrist joints MCPs with good range of motion. She has DIP thickening in her hands consistent with osteoarthritis. Hip joints knee joints ankles MTPs PIPs with good range  of motion with no synovitis she has tenderness over bilateral plantar fascia consistent with plantar fasciitis.  CDAI Exam: No CDAI exam completed.    Investigation: No additional findings.   Imaging: No results found.  Speciality Comments: No specialty comments available.    Procedures:  Trigger Point Inj Date/Time: 04/19/2016 2:10 PM Performed by: Bo Merino Authorized by: Bo Merino   Consent Given by:  Patient Site marked: the procedure site was marked   Timeout: prior to procedure the correct patient, procedure, and site was verified   Indications:  Muscle spasm and pain Total # of Trigger Points:  1 Location: neck   Needle Size:  27 G Approach:  Dorsal Medications #1:  0.5 mL lidocaine 1 %; 10 mg triamcinolone acetonide 40 MG/ML Patient tolerance:  Patient tolerated the procedure well with no immediate complications Large Joint  Inj Date/Time: 04/19/2016 2:10 PM Performed by: Bo Merino Authorized by: Bo Merino   Consent Given by:  Patient Site marked: the procedure site was marked   Timeout: prior to procedure the correct patient, procedure, and site was verified   Indications:  Pain Location:  Sacroiliac Site:  L sacroiliac joint Prep: patient was prepped and draped in usual sterile fashion   Needle Size:  27 G Needle Length:  1.5 inches Approach:  Superior Ultrasound Guidance: No   Fluoroscopic Guidance: No   Arthrogram: No   Medications:  1 mL lidocaine 1 %; 40 mg triamcinolone acetonide 40 MG/ML Aspiration Attempted: Yes   Aspirate amount (mL):  0 Patient tolerance:  Patient tolerated the procedure well with no immediate complications   Allergies: Trazodone hcl and Penicillins   Assessment / Plan:     Visit Diagnoses: Fibromyalgia: Active disease with generalized pain and discomfort and positive tender points.  Bilateral plantar fasciitis: She's having a lot of discomfort walking proper fitting shoes were showing inserts were discussed. I will also refer her to physical therapy.  Other fatigue: She continues to have some chronic fatigue.  Neck pain: She is having stiffness and discomfort. After informed consent was obtained the right trapezius area was prepped and injected with cortisone as  described above.  Chronic left SI joint pain: She is having a lot of discomfort walking with the left SI joint. Different treatment options and side effects were discussed. Per her request the left SI joint was prepped and injected with cortisone as described above she tolerated the procedure well.  DDD cervical spine: Chronic pain  DDD lumbar spine: Chronic pain  Primary osteoarthritis of both hands: Joint protection and muscle strengthening discussed.  Primary osteoarthritis of both knees : Strengthening exercises discussed.  Vitamin D deficiency she's been on supplement. We will check  her levels again today.   Orders: Orders Placed This Encounter  Procedures  . Trigger Point Injection  . Large Joint Injection/Arthrocentesis  . VITAMIN D 25 Hydroxy (Vit-D Deficiency, Fractures)  . Ambulatory referral to Physical Therapy   No orders of the defined types were placed in this encounter.   Face-to-face time spent with patient was 30 minutes. 50% of time was spent in counseling and coordination of care.  Follow-Up Instructions: Return in about 6 months (around 10/18/2016) for Fibromyalgia, osteoarthritis.   Bo Merino, MD

## 2016-04-19 NOTE — Patient Instructions (Addendum)

## 2016-04-20 LAB — VITAMIN D 25 HYDROXY (VIT D DEFICIENCY, FRACTURES): VIT D 25 HYDROXY: 61 ng/mL (ref 30–100)

## 2016-04-20 NOTE — Progress Notes (Signed)
Vitamin D within normal limits now she can continue with over-the-counter 1000 units a day

## 2016-04-26 IMAGING — MR MR MRA HEAD W/O CM
9 of 14 series · 21 of 48 positions shown · IV contrast (multihance)
Comparison: Brain MRI 03/12/2004.  Cervical spine MRI 05/13/2008.

ADDENDUM:
Diffusion weighted [HOSPITAL] it on 12/05/2013 at 7711 hrs.

No restricted diffusion or evidence of acute infarction. No new
finding identified.
CLINICAL DATA: 62-year-old female with nocturnal lower extremity
jerking. Episodes of dizziness. Initial encounter.
EXAM:
MRI HEAD WITHOUT AND WITH CONTRAST
MRA HEAD WITHOUT CONTRAST
MRA NECK WITHOUT AND WITH CONTRAST
TECHNIQUE: Multiplanar, multiecho pulse sequences of the brain and surrounding
structures were obtained without and with intravenous contrast.
Angiographic images of the Circle of Willis were obtained using MRA
technique without intravenous contrast. Angiographic images of the
neck were obtained using MRA technique without and with intravenous
contrast. Carotid stenosis measurements (when applicable) are
obtained utilizing NASCET criteria, using the distal internal
carotid diameter as the denominator.
CONTRAST:  15mL MULTIHANCE GADOBENATE DIMEGLUMINE 529 MG/ML IV SOLN

[Series 2: T1 · sagittal · 5.0mm · 0.47mm/px · 1 of 21 slices shown]
[im 1/21]
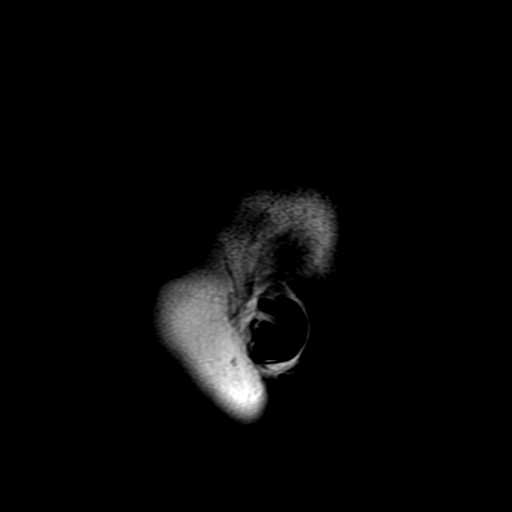

[Series 3: DWI · axial · 5.0mm · 1.25mm/px · z∈[-69,+68]mm · 3 of 52 slices shown (1 of 2)]
[im 1/52]
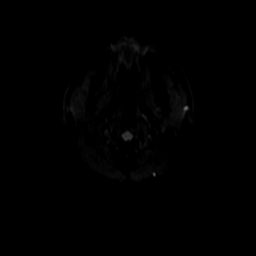
[im 26/52]
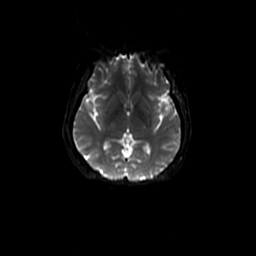
[im 52/52]
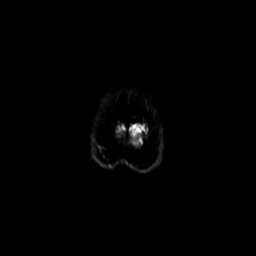

[Series 5: T2 · axial · 5.0mm · 0.43mm/px · 1 of 24 slices shown (1 of 2)]
[im 1/24]
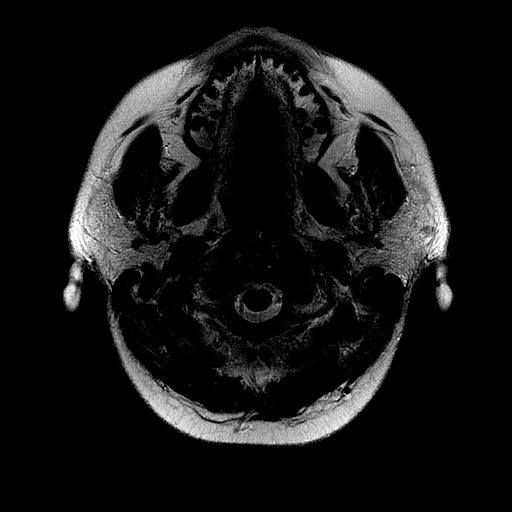

[Series 6: FLAIR · axial · 5.0mm · 0.43mm/px · 1 of 24 slices shown]
[im 1/24]
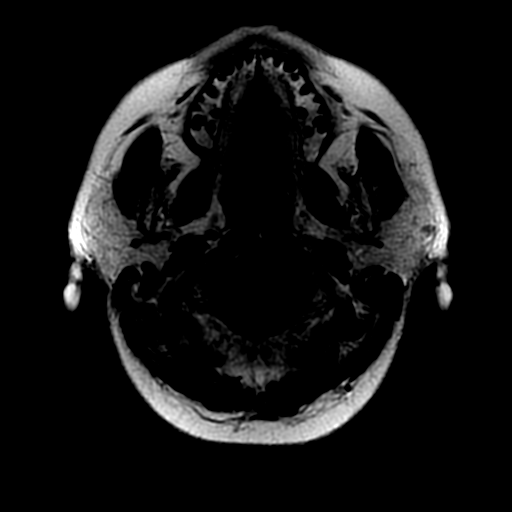

[Series 7: T2-star · axial · 5.0mm · 0.43mm/px · 1 of 24 slices shown]
[im 1/24]
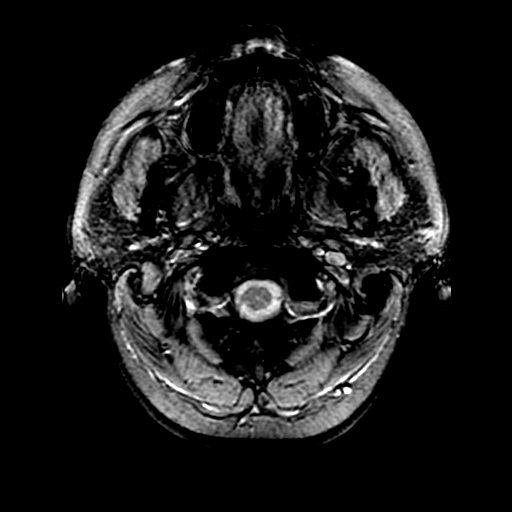

[Series 9: (id) mt fs · axial · 1.4mm · 0.43mm/px · z∈[-63,+31]mm · 8 of 136 slices shown]
[im 1/136]
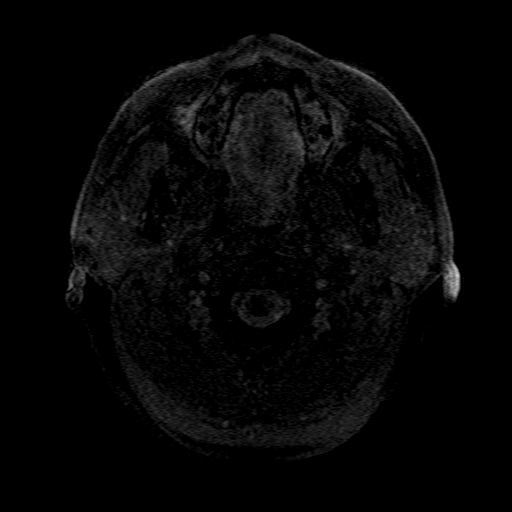
[im 20/136]
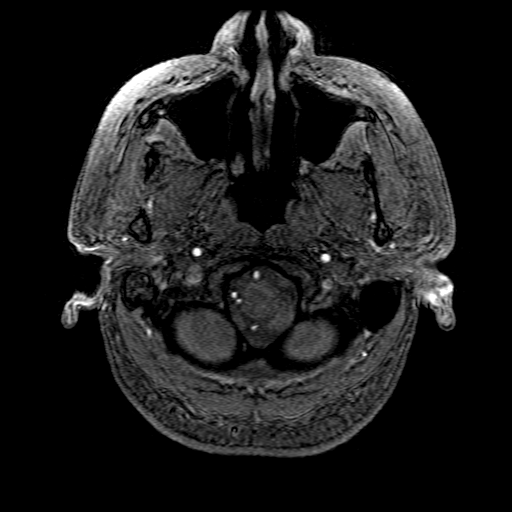
[im 39/136]
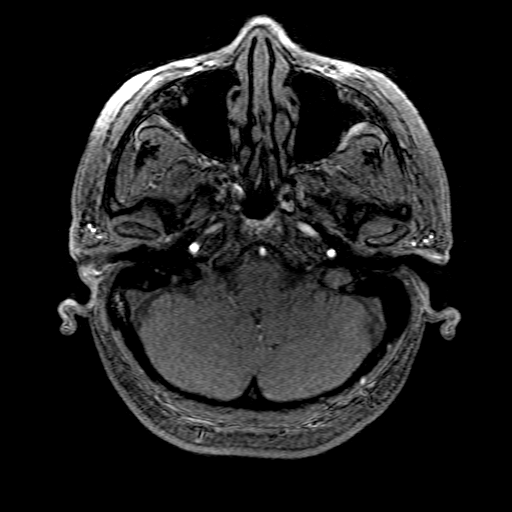
[im 58/136]
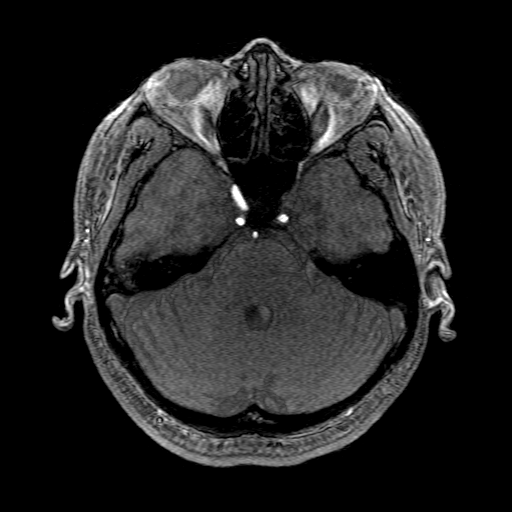
[im 78/136]
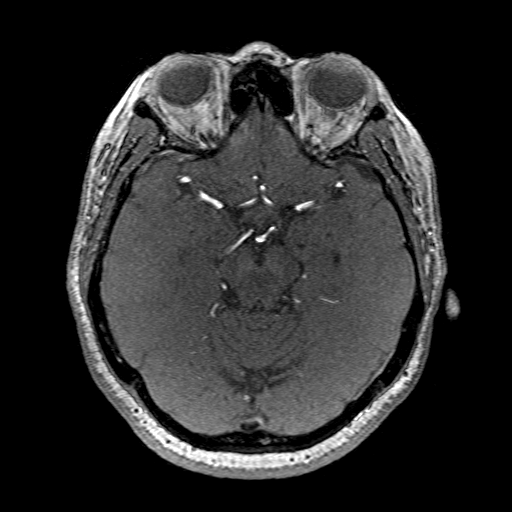
[im 97/136]
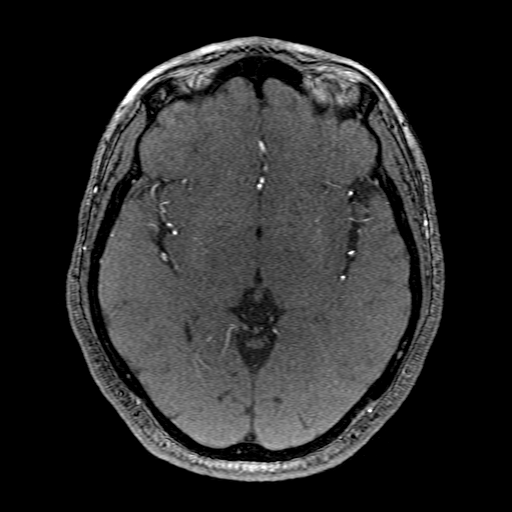
[im 116/136]
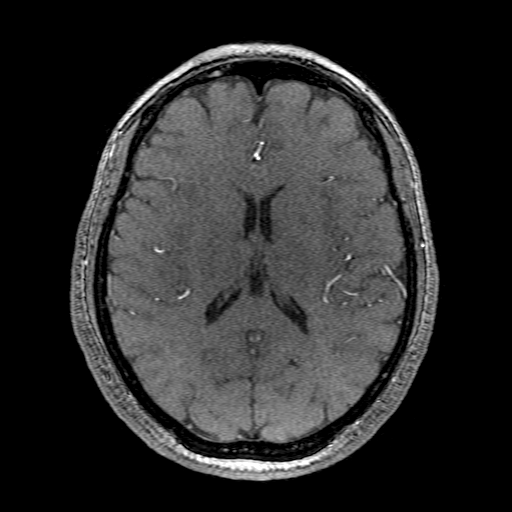
[im 136/136]
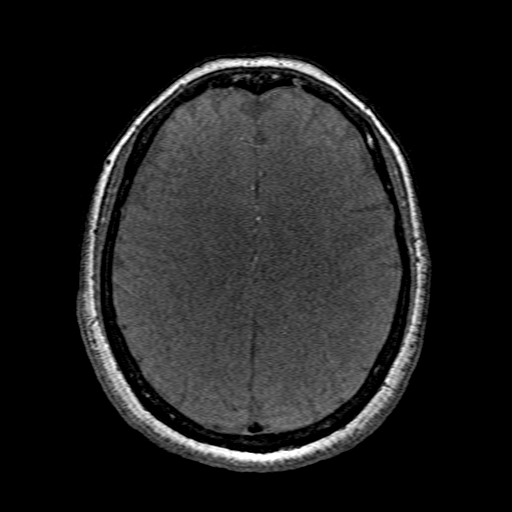

[Series 11: T2 · coronal · 5.0mm · 0.43mm/px · 2 of 25 slices shown (2 of 2)]
[im 1/25]
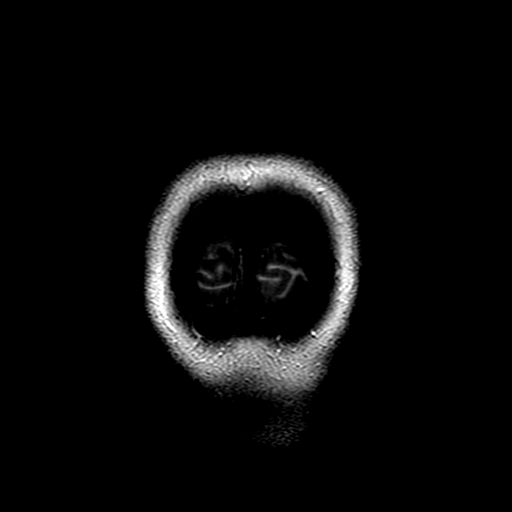
[im 25/25]
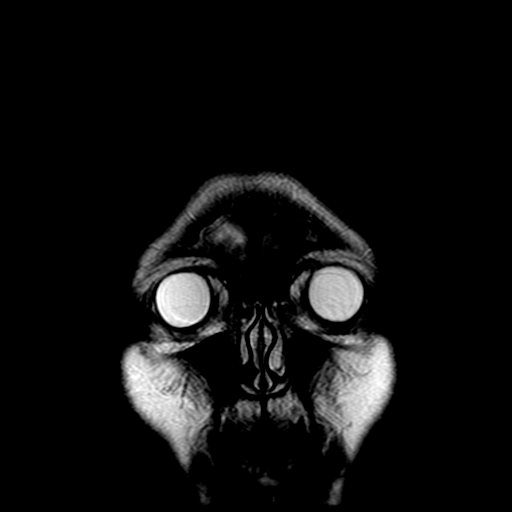

[Series 17: T1 post-contrast · coronal · 5.0mm · 0.43mm/px · 2 of 25 slices shown]
[im 1/25]
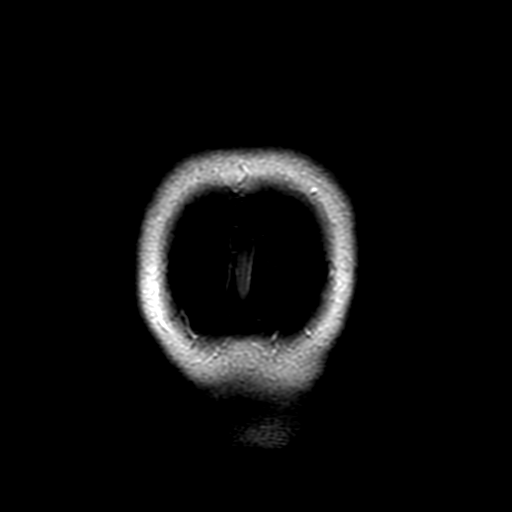
[im 25/25]
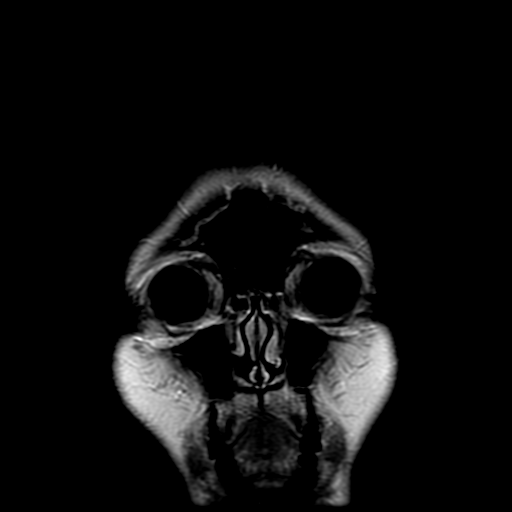

[Series 300: DWI · axial · 5.0mm · 1.25mm/px · z∈[-69,+68]mm · 2 of 26 slices shown (2 of 2)]
[im 1/26]
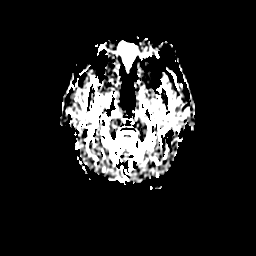
[im 26/26]
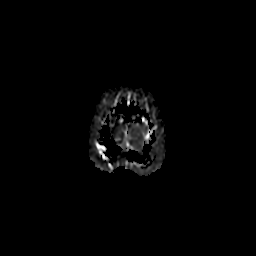

[21 of 48 positions shown; findings below may reference images not displayed]

CONCLUSION: Stable and negative MRI appearance of the brain.
FINDINGS: MRI HEAD FINDINGS

Stable and normal cerebral volume.

Diffusion imaging inadvertently was not performed. Attempts are
being made to bring the patient back to the [HOSPITAL] for
diffusion-weighted [HOSPITAL] the time of this report.

No midline shift, mass effect, evidence of mass lesion,
ventriculomegaly, extra-axial collection or acute intracranial
hemorrhage. Cervicomedullary junction and pituitary are within
normal limits. Stable visualized cervical spine.

Major intracranial vascular flow voids are stable. Unchanged
presumed prominent arachnoid granulation at the left vertex (series
11, image 10).

Stable and normal gray and white matter signal throughout the brain.
No abnormal enhancement identified.

Visible internal auditory structures appear normal. Visualized orbit
soft tissues are within normal limits. Negative paranasal sinuses.
Stable, negative mastoid air cells. Visualized scalp soft tissues
are within normal limits. Visualized bone marrow signal is within
normal limits.

MRA HEAD FINDINGS

Antegrade flow in the posterior circulation with codominant distal
vertebral arteries. The distal right vertebral artery functionally
terminates in the right PICA. Patent vertebrobasilar junction. No
basilar artery stenosis. Normal SCA and left PCA origins. Fetal type
right PCA origin. Left posterior communicating artery is diminutive
or absent. Bilateral PCA branches are within normal limits.

Antegrade flow in both ICA siphons. Mildly asymmetric tortuosity of
the cavernous ICA segments greater on the right. No ICA siphon
stenosis. Ophthalmic and right posterior communicating artery
origins are within normal limits. Normal carotid termini, MCA and
ACA origins. Diminutive or absent anterior communicating artery.
Visualized bilateral ACA branches are within normal limits.
Visualized bilateral MCA branches are within normal limits.

MRA NECK FINDINGS

Precontrast time-of-flight images demonstrate antegrade flow in both
carotid and vertebral arteries in the neck. Normal carotid
bifurcations.

Post contrast MRA images reveal a 3 vessel arch configuration.
Normal bilateral cervical carotid arteries. Normal bilateral
cervical vertebral arteries.
IMPRESSION: 1. Stable and negative MRI appearance of the brain; inadvertently
diffusion-weighted imaging was not performed. Attempts will be made
to brain the patient back for diffusion weighted imaging, and an
addendum will be dictated.
2. Negative MRA head and neck.

## 2016-05-25 ENCOUNTER — Ambulatory Visit: Payer: Medicare Other | Attending: Internal Medicine | Admitting: Physical Therapy

## 2016-05-25 ENCOUNTER — Ambulatory Visit: Payer: Medicare Other | Admitting: Adult Health

## 2016-05-25 DIAGNOSIS — R262 Difficulty in walking, not elsewhere classified: Secondary | ICD-10-CM | POA: Insufficient documentation

## 2016-05-25 DIAGNOSIS — M6281 Muscle weakness (generalized): Secondary | ICD-10-CM | POA: Diagnosis not present

## 2016-05-25 DIAGNOSIS — M79672 Pain in left foot: Secondary | ICD-10-CM | POA: Insufficient documentation

## 2016-05-25 DIAGNOSIS — M79671 Pain in right foot: Secondary | ICD-10-CM | POA: Diagnosis not present

## 2016-05-25 DIAGNOSIS — M797 Fibromyalgia: Secondary | ICD-10-CM | POA: Diagnosis not present

## 2016-05-25 NOTE — Therapy (Signed)
[place], [place], [address] Phone: [phone]   Fax:  [phone]  Physical Therapy Evaluation  Patient Details  Name: Christina Deleon MRN: [id number] Date of Birth: 10/07/1950 Referring Provider: Bo Merino  Encounter Date: 05/25/2016      PT End of Session - 05/25/16 0840    Visit Number 1   Number of Visits 16   Date for PT Re-Evaluation 07/20/16   Authorization Type [employer]   Authorization Time Period 07-20-16   PT Start Time 0840   PT Stop Time 0925   PT Time Calculation (min) 45 min   Activity Tolerance Patient tolerated treatment well   Behavior During Therapy [facility] for tasks assessed/performed      Past Medical History:  Diagnosis Date  . Adenomatous colon polyp 10/1997, and 03/2009  . Allergic rhinitis   . Allergy   . Arthritis    osteo  . Asthma   . Chronic gastritis   . Fibromyalgia   . GERD (gastroesophageal reflux disease)   . Hemorrhoids   . Hyperlipidemia   . Hypertension   . Menorrhagia    partial hysterectomy  . Migraines   . Sleep disturbance   . 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 CATARACT EXTRACTION Bilateral 10/16 & 11/16  . TONSILLECTOMY  age 17  . tooth extract     with bone graft  . Turbinate sinus surgery  2003  . VAGINAL HYSTERECTOMY  1998   secondary to prolapse, ovaries remain    There were no vitals filed for this visit.       Subjective Assessment - 05/25/16 0850    Subjective My Doctor gave me injections cortisone in back and neck and received good relief but not my feet.  but my feet are somewhat better.  My migraines are less now.  My feet are somewhat better since my back and neck injections but i still have problems walking especially at night.  I take care of my mother who is 35 and she lives alone and I help her.  She is 3 hours  away.   Pertinent History Fibromylagia since 2012, DDD of neck and lumbar,  chronic left shoulder impingment syndrome, GERD, migraines, asthma,    Limitations Standing;Walking;House hold activities   How long can you sit comfortably? no pain while sitting but sometimes at end of day   How long can you stand comfortably? 15 minutes pain in feet and back   How long can you walk comfortably? 15 min pain in feet and back   Diagnostic tests none for feet but MRI for back and neck and left shoulder   Patient Stated Goals less pain in feet ,get a HEP for ankle and legs. be able to care for mom without exacerbating own pain.  start a walking program.   Currently in Pain? Yes   Pain Score 6    Pain Location Ankle   Pain Orientation Right   Pain Descriptors / Indicators Tightness;Sharp;Shooting   Pain Type Chronic pain   Pain Onset More than a month ago   Pain Frequency Intermittent   Aggravating Factors  initially when she stands and walks ,  worse in the morning and walks tentatively.   Pain Relieving Factors resting and medication   Multiple Pain Sites Yes   Pain Score 6   Pain Location Ankle  Pain Orientation Left   Pain Descriptors / Indicators Tightness;Sharp;Aching   Pain Type Chronic pain   Pain Onset More than a month ago   Pain Frequency Intermittent   Pain Score 6   Pain Location Back   Pain Orientation Left;Right;Mid;Lower   Pain Descriptors / Indicators Aching;Other (Comment)  catching   Pain Type Chronic pain   Pain Onset More than a month ago   Pain Frequency Intermittent   Aggravating Factors  standing, fatigue, washing dishes, laundry , hard to lift anything. rotating   Pain Relieving Factors resting and medication and injections   Pain Score 9   Pain Location Head   Pain Orientation Right;Left   Pain Descriptors / Indicators Throbbing   Pain Type Chronic pain   Pain Onset More than a month ago   Pain Frequency Intermittent   Aggravating Factors  lifting             [facility] PT Assessment - 05/25/16 0904      Assessment   Medical Diagnosis fibromyalgia   Referring Provider deveshwar, Abel Presto   Onset Date/Surgical Date 03/17/16   Hand Dominance Right   Prior Therapy none for ankles     Precautions   Precautions None     Restrictions   Weight Bearing Restrictions No     Balance Screen   Has the patient fallen in the past 6 months No   Has the patient had a decrease in activity level because of a fear of falling?  No   Is the patient reluctant to leave their home because of a fear of falling?  No     Prior Function   Level of Independence Independent   Vocation Retired   Herbalist  retired   Leisure not exercising now      [employer]   Overall Cognitive Status Within Functional Limits for tasks assessed     Observation/Other Assessments   Focus on Therapeutic Outcomes (FOTO)  intake 35% limtation 65%  predicted 45%     Posture/Postural Control   Posture/Postural Control Postural limitations   Postural Limitations Rounded Shoulders;Forward head   Posture Comments pes planus, over pronation may benefit from orthotics     AROM   Right Ankle Dorsiflexion 5   Right Ankle Plantar Flexion 49   Right Ankle Inversion 25   Right Ankle Eversion 15   Left Ankle Dorsiflexion 8   Left Ankle Plantar Flexion 52   Left Ankle Inversion 25   Left Ankle Eversion 13   Lumbar Flexion 60   Lumbar Extension 15   Lumbar - Right Side Bend 22   Lumbar - Left Side Bend 12  pain on left   Lumbar - Right Rotation 50%   Lumbar - Left Rotation 50%     Strength   Overall Strength Comments heel raise bil 8/20 fatigues   Right Hip Flexion 4-/5   Right Hip Extension 4-/5   Right Hip ABduction 4-/5   Left Hip Extension 4-/5   Right Knee Flexion 4/5   Right Knee Extension 4/5   Left Knee Flexion 4/5   Left Knee Extension 4/5   Right Ankle Dorsiflexion 4-/5   Right Ankle Plantar Flexion 4-/5   Left Ankle  Dorsiflexion 4-/5   Left Ankle Plantar Flexion 4-/5     Flexibility   Hamstrings right 60  left 68     Ambulation/Gait   Assistive device None   Gait Pattern Step-through pattern;Decreased stride length   Ambulation Surface  Level   Gait velocity 2.24 ft/sec                   [facility] Adult PT Treatment/Exercise - 05/25/16 0904      Ankle Exercises: Stretches   Plantar Fascia Stretch 3 reps   Plantar Fascia Stretch Limitations use of ice water bottle for rollling   Soleus Stretch 3 reps;30 seconds   Soleus Stretch Limitations bil   Gastroc Stretch 3 reps;30 seconds   Gastroc Stretch Limitations bil   Other Stretch seated and sitting gastroc stretch with towel 30 sec hold bil x 3                PT Education - 05/25/16 [id number]    Education provided Yes   Education Details POC explanation of findings, initial HEP    Person(s) Educated Patient   Methods Explanation;Demonstration;Tactile cues;Verbal cues;Handout   Comprehension Verbalized understanding;Returned demonstration          PT Short Term Goals - 05/25/16 0924      PT SHORT TERM GOAL #1   Title pt will be independent with intial HEP   Time 4   Period Weeks   Status New     PT SHORT TERM GOAL #2   Title Pt will be able to walk for 15 minutes without exacerbating pain in bil ankle/feet   Time 4   Period Weeks   Status New     PT SHORT TERM GOAL #3   Title Pt will be able to walk without a wide base stance due to pain in bil feet   Time 4   Period Weeks   Status New           PT Long Term Goals - 05/25/16 0930      PT LONG TERM GOAL #1   Title "Pt will be independent with advanced HEP   Time 8   Period Weeks   Status New     PT LONG TERM GOAL #2   Title "Pt will improve bil LE muscle strength to >/= 4+/5 with </= 2/10 pain to promote safety with walking/standing activities   Time 8   Period Weeks   Status New     PT LONG TERM GOAL #3   Title "FOTO will improve from   65%limitation  to45% limitation    indicating improved functional mobility .    Time 8   Period Weeks   Status New     PT LONG TERM GOAL #4   Title "Pt will be able to negotiate steps without exacerbation of pain greater than 2/10    Time 8   Period Weeks   Status New     PT LONG TERM GOAL #5   Title "Pt will tolerate standing and walking for 1 hour without increased pain in order to care for 59 yo mother   Time 8   Period Weeks   Status New     PT LONG TERM GOAL #6   Title Pt will be able to increase gait velocity to at least 2.62 ft/sec in order to ambulate at community level and begin home walking program for exercises   Time 8   Period Weeks   Status New     PT LONG TERM GOAL #7   Title Pt will be educated  on community resources for fibromyalgia and be aware of community wellness aquatics program   Time 8   Period Weeks   Status New  Plan - Jun 03, 2016 1616    Clinical Impression Statement 66 yo female with history of Fibromyalgia, DDD of cervical/lumbar spine and bil plantar fasciitis presents with concerns and seeks treatment for bil plantar fasciitis which is effecting her ability to walk and care for 63 yr old mother that lives 3 hours away.,  Ms. Carstensen presents with bil pain in ankles , impaired AROM of ankles . L> R, LE weakness as well as core.  Pain level at times 9/10 but at rest sitting is 4/10.  Pt recieved cortisone injections in neck and lumbar spine which has eased pain in bil ankles as reported by pt.  Pt is limited in her ability to walk without pain and can only stand/ walk 10-15 minutes.  Pt will benefit from skilled PT to address these impairments and she would like to begin a walking program for exericies   Rehab Potential Good   PT Frequency 2x / week   PT Duration 8 weeks   PT Treatment/Interventions ADLs/Self Care Home Management;Cryotherapy;Electrical Stimulation;Iontophoresis 4mg /ml Dexamethasone;Stair training;Gait  training;Ultrasound;Moist Heat;Therapeutic exercise;Neuromuscular re-education;Patient/family education;Passive range of motion;Manual techniques;Dry needling;Taping   PT Next Visit Plan progress plantar fasciits exercises, review exercises, Dry needle as needed. plantar fascia stretch   PT Home Exercise Plan gastroc stretch, soleus stretch in standing  seated gastroc with towel   Consulted and Agree with Plan of Care Patient      Patient will benefit from skilled therapeutic intervention in order to improve the following deficits and impairments:  Difficulty walking, Decreased mobility, Decreased range of motion, Decreased strength, Impaired flexibility, Increased fascial restricitons, Pain, Postural dysfunction, Improper body mechanics  Visit Diagnosis: Foot pain, bilateral  Difficulty in walking, not elsewhere classified  Fibromyalgia  Muscle weakness (generalized)      G-Codes - 06/03/2016 1612    Functional Assessment Tool Used FOTO   Functional Limitation Mobility: Walking and moving around   Mobility: Walking and Moving Around Current Status [phone]) At least 60 percent but less than 80 percent impaired, limited or restricted  65%   Mobility: Walking and Moving Around Goal Status [phone]) At least 40 percent but less than 60 percent impaired, limited or restricted  45%       Problem List Patient Active Problem List   Diagnosis Date Noted  . Fibromyalgia 04/19/2016  . Other fatigue 04/19/2016  . 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 Primary osteoarthritis of both knees 04/19/2016  . Leg pain 01/22/2016  . Restless legs syndrome 01/22/2016  . Snoring 11/19/2015  . Periodic limb movement sleep disorder 11/19/2015  . GERD (gastroesophageal reflux disease) 10/08/2015  . Mild persistent asthma 04/09/2015  . LPRD (laryngopharyngeal reflux disease) 04/09/2015  . Allergic rhinoconjunctivitis 04/09/2015  . HEMATOCHEZIA 04/01/2010  . COUGH 04/01/2010  . GASTRITIS 09/30/2009  .  NAUSEA 09/30/2009  . ABDOMINAL PAIN-EPIGASTRIC 09/30/2009  . DYSTHYMIC DISORDER 09/26/2009  . PALPITATIONS 06/17/2009  . SHOULDER IMPINGEMENT SYNDROME, LEFT 06/04/2009  . CONSTIPATION 02/05/2009  . PERSONAL HX COLONIC POLYPS 02/05/2009  . ASTHMA, PERSISTENT, MILD 02/04/2009  . CHEST PAIN, ATYPICAL 02/04/2009  . COLONIC POLYPS, BENIGN 12/18/2008  . HYPERLIPIDEMIA 12/18/2008  . Insomnia 12/18/2008  . MIGRAINE, COMMON 12/18/2008  . HYPERTENSION 12/18/2008  . ALLERGIC RHINITIS 12/18/2008  . GERD 12/18/2008  . Primary osteoarthritis of both hands 12/18/2008  . DJD (degenerative joint disease), cervical 12/18/2008  . NECK PAIN, CHRONIC 12/18/2008  . LOW BACK PAIN, CHRONIC 12/18/2008    Christina Deleon, PT Exercise Expert for the Aging Adult  Jun 03, 2016 4:24  PM Phone: [phone] Fax: [facility] [address] [place], [place], [address] Phone: [phone]   Fax:  [phone]  Name: Christina Deleon MRN: [id number] Date of Birth: December 05, 1950

## 2016-05-25 NOTE — Patient Instructions (Addendum)
SEATED Gastroc / Heel Cord Stretch - Seated With Towel   Sit on floor, towel around ball of foot. Gently pull foot in toward body, stretching heel cord and calf. Hold for _30-60__ seconds. Repeat on involved leg. Repeat __3_ times. Do _3-5__ times per day.  SEATED Soleus Stretch: ANKLE: Dorsiflexion - Sitting   Sitting, place strap around foot. Pull foot toward body, keeping heel on floor. Keep foot straight. Hold _30-60__ seconds. _3__ reps per set, __3_ sets per day, _7__ days per week 3-5 x a day  Achilles / Gastroc, Standing   Stand, right foot behind, heel on floor and turned slightly out, leg straight, forward leg bent. Move hips forward. Hold _30-60__ seconds. Repeat _3__ times per session. Do _3-5__ sessions per day.   Stretching: Soleus   Stand with right foot back, both knees bent. Keeping heel on floor, turned slightly out, lean into wall until stretch is felt in lower calf. Hold _30-60___ seconds. Repeat __3__ times per set. Do __3-5__ sessions per day.    for pain , freeze a water bottle   [place] fina  Roll your foot over it to provide relieft as shown in clinic  Gave information on trigger point dry needling  Voncille Lo, PT Exercise Expert for the Aging Adult  05/25/16 9:24 AM Phone: [phone] Fax: [phone]

## 2016-05-26 ENCOUNTER — Ambulatory Visit (INDEPENDENT_AMBULATORY_CARE_PROVIDER_SITE_OTHER): Payer: Medicare Other | Admitting: Allergy and Immunology

## 2016-05-26 ENCOUNTER — Encounter: Payer: Self-pay | Admitting: Allergy and Immunology

## 2016-05-26 VITALS — BP 122/76 | HR 88 | Resp 18

## 2016-05-26 DIAGNOSIS — J3089 Other allergic rhinitis: Secondary | ICD-10-CM | POA: Diagnosis not present

## 2016-05-26 DIAGNOSIS — J4531 Mild persistent asthma with (acute) exacerbation: Secondary | ICD-10-CM | POA: Diagnosis not present

## 2016-05-26 DIAGNOSIS — K219 Gastro-esophageal reflux disease without esophagitis: Secondary | ICD-10-CM

## 2016-05-26 MED ORDER — METHYLPREDNISOLONE ACETATE 80 MG/ML IJ SUSP
80.0000 mg | Freq: Once | INTRAMUSCULAR | Status: AC
  Administered 2016-05-26: 80 mg via INTRAMUSCULAR

## 2016-05-26 NOTE — Progress Notes (Signed)
Follow-up Note  Referring Provider: Lanice Shirts, * Primary Provider: Kelton Pillar, MD Date of Office Visit: 05/26/2016  Subjective:   Christina Deleon (DOB: 02/26/1951) is a 66 y.o. female who returns to [facility] on 05/[facility]/2016 in re-evaluation of the following:  HPI: [name] returns to this clinic in reevaluation of her asthma and allergic rhinitis and LPR. I've not seen her in this clinic since May 2017.  During the interval she did very well and has not required either a systemic steroid or an antibiotic to treat her respiratory tract issue. She rarely used any short acting bronchodilator and can exercise without any problem. Her reflux is under excellent control. She does not consume any caffeine or chocolate.  However, approximately 2 weeks ago she developed cough and postnasal drip and nasal congestion along with sore throat. She has some occasional chest tightness and wheezing. She's had no fever or chest pain or ugly sputum production or ugly nasal discharge.  She did receive the flu vaccine this year  Allergies as of 05/26/2016      Reactions   Trazodone Hcl Other (See Comments)   REACTION: bad headaches   Penicillins Nausea Only, Rash   Patient has taken Amoxicillin without an issue, per patient. Has patient had a PCN reaction causing immediate rash, facial/tongue/throat swelling, SOB or lightheadedness with hypotension: No Has patient had a PCN reaction causing severe rash involving mucus membranes or skin necrosis: No Has patient had a PCN reaction that required hospitalization No Has patient had a PCN reaction occurring within the last 10 years: No If all of the above answers are "NO", then may proceed with Cephalosporin use.      Medication List      albuterol 108 (90 Base) MCG/ACT inhaler Commonly known as:  PROAIR HFA Inhale 2 puffs into the lungs every 6 (six) hours as needed for wheezing or shortness of breath.   candesartan  16 MG tablet Commonly known as:  ATACAND Take 8 mg by mouth at bedtime.   cetirizine 10 MG tablet Commonly known as:  ZYRTEC Take 1 tablet (10 mg total) by mouth daily. One tab daily for allergies   fluticasone 50 MCG/ACT nasal spray Commonly known as:  FLONASE instill 2 sprays into each nostril once daily   methocarbamol 500 MG tablet Commonly known as:  ROBAXIN Take 1 tablet (500 mg total) by mouth daily as needed for muscle spasms.   mometasone 220 MCG/INH inhaler Commonly known as:  ASMANEX 60 METERED DOSES Inhale one dose twice daily to prevent cough or wheeze.  Rinse, gargle, and spit after use.   montelukast 10 MG tablet Commonly known as:  SINGULAIR Take 1 tablet (10 mg total) by mouth at bedtime.   multivitamin tablet Take 1 tablet by mouth daily. Calcium, Vitamin B-12, Vitamin A, Vitamin C   nabumetone 500 MG tablet Commonly known as:  RELAFEN Take 500 mg by mouth 2 (two) times daily as needed for mild pain.   omeprazole 40 MG capsule Commonly known as:  PRILOSEC Take 1 capsule (40 mg total) by mouth 2 (two) times daily.   polyethylene glycol powder powder Commonly known as:  GLYCOLAX/MIRALAX Take 17 g by mouth daily.   pravastatin 20 MG tablet Commonly known as:  PRAVACHOL Take 20 mg by mouth daily.   ranitidine 300 MG tablet Commonly known as:  ZANTAC Take 1 tablet (300 mg total) by mouth at bedtime.   zolmitriptan 2.5 MG disintegrating  tablet Commonly known as:  ZOMIG ZMT Take 1 tablet (2.5 mg total) by mouth as needed for migraine.   zolpidem 5 MG tablet Commonly known as:  AMBIEN TAKE 1-2 TABLETS BY MOUTH AT BEDTIME AS NEEDED       Past Medical History:  Diagnosis Date  . Adenomatous colon polyp 10/1997, and 03/2009  . Allergic rhinitis   . Allergy   . Arthritis    osteo  . Asthma   . Chronic gastritis   . Fibromyalgia   . GERD (gastroesophageal reflux disease)   . Hemorrhoids   . Hyperlipidemia   . Hypertension   . Menorrhagia     partial hysterectomy  . Migraines   . Sleep disturbance   . 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 CATARACT EXTRACTION Bilateral 10/16 & 11/16  . TONSILLECTOMY  age 38  . tooth extract     with bone graft  . Turbinate sinus surgery  2003  . VAGINAL HYSTERECTOMY  1998   secondary to prolapse, ovaries remain    Review of systems negative except as noted in HPI / PMHx or noted below:  Review of Systems  Constitutional: Negative.   HENT: Negative.   Eyes: Negative.   Respiratory: Negative.   Cardiovascular: Negative.   Gastrointestinal: Negative.   Genitourinary: Negative.   Musculoskeletal: Negative.   Skin: Negative.   Neurological: Negative.   Endo/Heme/Allergies: Negative.   Psychiatric/Behavioral: Negative.      Objective:   Vitals:   05/26/16 1019  BP: 122/76  Pulse: 88  Resp: 18          Physical Exam  Constitutional: She is well-developed, well-nourished, and in no distress.  HENT:  Head: Normocephalic.  Right Ear: Tympanic membrane, external ear and ear canal normal.  Left Ear: Tympanic membrane, external ear and ear canal normal.  Nose: Mucosal edema (erythematous) present. No rhinorrhea.  Mouth/Throat: Uvula is midline, oropharynx is clear and moist and mucous membranes are normal. No oropharyngeal exudate.  Eyes: Conjunctivae are normal.  Neck: Trachea normal. No tracheal tenderness present. No tracheal deviation present. No thyromegaly present.  Cardiovascular: Normal rate, regular rhythm, S1 normal, S2 normal and normal heart sounds.   No murmur heard. Pulmonary/Chest: No stridor. No respiratory distress. She has wheezes (bilateral expiratory wheezes posterior lung fields). She has no rales.  Musculoskeletal: She exhibits no edema.  Lymphadenopathy:       Head (right side): No tonsillar adenopathy present.       Head (left side): No tonsillar  adenopathy present.    She has no cervical adenopathy.  Neurological: She is alert. Gait normal.  Skin: No rash noted. She is not diaphoretic. No erythema. Nails show no clubbing.  Psychiatric: Mood and affect normal.    Diagnostics:    Spirometry was performed and demonstrated an FEV1 of 1.62 at 83 % of predicted.  Assessment and Plan:   1. Asthma, not well controlled, mild persistent, with acute exacerbation   2. Other allergic rhinitis   3. LPRD (laryngopharyngeal reflux disease)     1. Continue omeprazole 40 mg in the morning and ranitidine 300mg  in the evening  2. Continue Asmanex 220 one inhalation two times per day. Can increase to 2 inhalations twice a day during "flareup"  3. Continue flonase one spray each nostril 1-2 times per day  4. Continue ProAir HFA and Zyrtec as needed  5.  Depo-Medrol 80 IM delivered in clinic today  6. Return to clinic in 6 months or earlier if problem  Ragene appears to have respiratory tract inflammation affecting both her upper and lower airway most likely secondary to a viral respiratory tract infection for which we will have her use a short course of systemic steroids as noted above and also continued to use anti-inflammatory medications for her respiratory tract and therapy directed against reflux. She will keep in contact with me noting her response. If she does well I will see her back in this clinic in 6 months or earlier if there is a problem.  Allena Katz, MD [place]

## 2016-05-26 NOTE — Patient Instructions (Addendum)
  1.  Continue omeprazole 40 mg in the morning and ranitidine 300mg  in the evening  2. Continue Asmanex 220 one inhalation two times per day. Can increase to 2 inhalations twice a day during "flareup"  3. Continue flonase one spray each nostril 1-2 times per day  4. Continue ProAir HFA and Zyrtec as needed  5. Depo-Medrol 80 IM delivered in clinic today  6. Return to clinic in 6 months or earlier if problem

## 2016-05-27 ENCOUNTER — Ambulatory Visit: Payer: Medicare Other | Admitting: Physical Therapy

## 2016-06-01 ENCOUNTER — Telehealth: Payer: Self-pay | Admitting: Allergy and Immunology

## 2016-06-01 ENCOUNTER — Ambulatory Visit: Payer: Medicare Other | Admitting: Physical Therapy

## 2016-06-01 DIAGNOSIS — R262 Difficulty in walking, not elsewhere classified: Secondary | ICD-10-CM | POA: Diagnosis not present

## 2016-06-01 DIAGNOSIS — M6281 Muscle weakness (generalized): Secondary | ICD-10-CM | POA: Diagnosis not present

## 2016-06-01 DIAGNOSIS — M79671 Pain in right foot: Secondary | ICD-10-CM

## 2016-06-01 DIAGNOSIS — M797 Fibromyalgia: Secondary | ICD-10-CM

## 2016-06-01 DIAGNOSIS — M79672 Pain in left foot: Principal | ICD-10-CM

## 2016-06-01 MED ORDER — PREDNISONE 20 MG PO TABS: 20.0000 mg | ORAL_TABLET | Freq: Every day | ORAL | 0 refills | Status: AC

## 2016-06-01 MED ORDER — AZITHROMYCIN 500 MG PO TABS: 500.0000 mg | ORAL_TABLET | Freq: Every day | ORAL | 0 refills | Status: AC

## 2016-06-01 NOTE — Therapy (Signed)
[place], [place], [address] Phone: [phone]   Fax:  [phone]  Physical Therapy Treatment  Patient Details  Name: Christina Deleon MRN: [id number] Date of Birth: 02-13-1951 Referring Provider: Bo Merino  Encounter Date: 06/01/2016      PT End of Session - 06/01/16 1117    Visit Number 2   Number of Visits 16   Date for PT Re-Evaluation 07/20/16   Authorization Type [employer]   Authorization Time Period 07-20-16   PT Start Time 0845   PT Stop Time 0928   PT Time Calculation (min) 43 min   Activity Tolerance Patient tolerated treatment well   Behavior During Therapy [facility] for tasks assessed/performed      Past Medical History:  Diagnosis Date  . Adenomatous colon polyp 10/1997, and 03/2009  . Allergic rhinitis   . Allergy   . Arthritis    osteo  . Asthma   . Chronic gastritis   . Fibromyalgia   . GERD (gastroesophageal reflux disease)   . Hemorrhoids   . Hyperlipidemia   . Hypertension   . Menorrhagia    partial hysterectomy  . Migraines   . Sleep disturbance   . 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 CATARACT EXTRACTION Bilateral 10/16 & 11/16  . TONSILLECTOMY  age 66  . tooth extract     with bone graft  . Turbinate sinus surgery  2003  . VAGINAL HYSTERECTOMY  1998   secondary to prolapse, ovaries remain    There were no vitals filed for this visit.      Subjective Assessment - 06/01/16 0850    Subjective I have not had an awful lot of pain.   Because I had the injections in the back and the neck.  I had a flare of my asthma and did not do exercises as well.    Pertinent History Fibromylagia since 2012, DDD of neck and lumbar,  chronic left shoulder impingment syndrome, GERD, migraines, asthma,    Limitations Standing;Walking;House hold activities   Patient Stated  Goals less pain in feet ,get a HEP for ankle and legs. be able to care for mom without exacerbating own pain.  start a walking program.   Currently in Pain? No/denies   Pain Score 2    Pain Location Ankle   Pain Orientation Right   Pain Descriptors / Indicators Shooting;Tightness   Pain Score 0   Pain Location Ankle   Pain Orientation Left                         [facility] Adult PT Treatment/Exercise - 06/01/16 0928      Self-Care   Self-Care Other Self-Care Comments   Other Self-Care Comments  discussed use of foam roller to gastrocs/plantar fascia and DOMS ( delayed onset muscle soreness and parameter of concern     Knee/Hip Exercises: Supine   Bridges Strengthening;2 sets;10 reps   Bridges Limitations instructed in abdominal engagement with TC/VC done in supine and sidelyiing to reinforce     Knee/Hip Exercises: Sidelying   Clams 2 x 10 with red t band on right and left in sidelying with abdomial engagement  Pt required VC intiially for correct execution     Manual Therapy   Manual Therapy Soft tissue mobilization   Soft tissue mobilization IASTYM bil gastroc  and plantar fascia at insertion     Ankle Exercises: Standing   Heel Raises 3 seconds   Heel Raises Limitations bil 20 x  then SL heel raise with UE support  9x on right then 10,  Left 12 x then additional 8.  Pt noted burning sensation with exertion more than 9 reps.     Ankle Exercises: Stretches   Plantar Fascia Stretch 3 reps   Soleus Stretch 3 reps;30 seconds   Soleus Stretch Limitations bil   Gastroc Stretch 3 reps;30 seconds   Gastroc Stretch Limitations bil                PT Education - 06/01/16 0910    Education provided Yes   Education Details Added BRigding and clamshells with red t band to HEP, DOMS and foam roller use   Person(s) Educated Patient   Methods Explanation;Demonstration;Tactile cues;Verbal cues;Handout   Comprehension Verbalized understanding;Returned demonstration           PT Short Term Goals - 06/01/16 1114      PT SHORT TERM GOAL #1   Title pt will be independent with intial HEP   Time 4   Period Weeks   Status On-going     PT SHORT TERM GOAL #2   Title Pt will be able to walk for 15 minutes without exacerbating pain in bil ankle/feet   Time 4   Period Weeks   Status On-going     PT SHORT TERM GOAL #3   Title Pt will be able to walk without a wide base stance due to pain in bil feet   Time 4   Period Weeks   Status On-going           PT Long Term Goals - 06/01/16 1115      PT LONG TERM GOAL #1   Title "Pt will be independent with advanced HEP   Time 8   Period Weeks   Status On-going     PT LONG TERM GOAL #2   Title "Pt will improve bil LE muscle strength to >/= 4+/5 with </= 2/10 pain to promote safety with walking/standing activities   Time 8   Period Weeks   Status On-going     PT LONG TERM GOAL #3   Title "FOTO will improve from  65%limitation  to45% limitation    indicating improved functional mobility .    Time 8   Period Weeks   Status On-going     PT LONG TERM GOAL #4   Title "Pt will be able to negotiate steps without exacerbation of pain greater than 2/10    Time 8   Period Weeks   Status On-going     PT LONG TERM GOAL #5   Title "Pt will tolerate standing and walking for 1 hour without increased pain in order to care for 66 yo mother   Time 8   Period Weeks   Status On-going     PT LONG TERM GOAL #6   Title Pt will be able to increase gait velocity to at least 2.62 ft/sec in order to ambulate at community level and begin home walking program for exercises   Time 8   Period Weeks   Status On-going     PT LONG TERM GOAL #7   Title Pt will be educated  on community resources for fibromyalgia and be aware of community wellness aquatics program   Time 8   Period Weeks   Status On-going  Plan - 06/01/16 1111    Clinical Impression Statement Pt returns from last visit  evaluation and pain level has dropped from 6/10 to minimal/zero in left/right plantar fascia.  Pt states she has been doing her exericises and doe not feel she will need trigger point dry needling now because exercise seems to be helping.  Pt explained importance of core strength for overall health and alignment of  body and will be working on advanced HEP.  Pt fatigues with heel raises around 9 reps.  ( normal is 20 /20) with 2-3 inch excursion.  Will continue toward progress /achiivement of goals   Rehab Potential Good   PT Frequency 2x / week   PT Duration 8 weeks   PT Treatment/Interventions ADLs/Self Care Home Management;Cryotherapy;Electrical Stimulation;Iontophoresis 4mg /ml Dexamethasone;Stair training;Gait training;Ultrasound;Moist Heat;Therapeutic exercise;Neuromuscular re-education;Patient/family education;Passive range of motion;Manual techniques;Dry needling;Taping   PT Next Visit Plan progress plantar fasciits exercises and core.  review foam rolling use.  Dry needle as needed. plantar fascia stretch t band ankle exercises   PT Home Exercise Plan gastroc stretch, soleus stretch in standing  seated gastroc with towel bridging, sidelying clam abdominal engagement   Consulted and Agree with Plan of Care Patient      Patient will benefit from skilled therapeutic intervention in order to improve the following deficits and impairments:  Difficulty walking, Decreased mobility, Decreased range of motion, Decreased strength, Impaired flexibility, Increased fascial restricitons, Pain, Postural dysfunction, Improper body mechanics  Visit Diagnosis: Foot pain, bilateral  Difficulty in walking, not elsewhere classified  Fibromyalgia  Muscle weakness (generalized)     Problem List Patient Active Problem List   Diagnosis Date Noted  . Fibromyalgia 04/19/2016  . Other fatigue 04/19/2016  . 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 Primary osteoarthritis of both knees 04/19/2016  . Leg pain  01/22/2016  . Restless legs syndrome 01/22/2016  . Snoring 11/19/2015  . Periodic limb movement sleep disorder 11/19/2015  . GERD (gastroesophageal reflux disease) 10/08/2015  . Mild persistent asthma 04/09/2015  . LPRD (laryngopharyngeal reflux disease) 04/09/2015  . Allergic rhinoconjunctivitis 04/09/2015  . HEMATOCHEZIA 04/01/2010  . COUGH 04/01/2010  . GASTRITIS 09/30/2009  . NAUSEA 09/30/2009  . ABDOMINAL PAIN-EPIGASTRIC 09/30/2009  . DYSTHYMIC DISORDER 09/26/2009  . PALPITATIONS 06/17/2009  . SHOULDER IMPINGEMENT SYNDROME, LEFT 06/04/2009  . CONSTIPATION 02/05/2009  . PERSONAL HX COLONIC POLYPS 02/05/2009  . ASTHMA, PERSISTENT, MILD 02/04/2009  . CHEST PAIN, ATYPICAL 02/04/2009  . COLONIC POLYPS, BENIGN 12/18/2008  . HYPERLIPIDEMIA 12/18/2008  . Insomnia 12/18/2008  . MIGRAINE, COMMON 12/18/2008  . HYPERTENSION 12/18/2008  . ALLERGIC RHINITIS 12/18/2008  . GERD 12/18/2008  . Primary osteoarthritis of both hands 12/18/2008  . DJD (degenerative joint disease), cervical 12/18/2008  . NECK PAIN, CHRONIC 12/18/2008  . LOW BACK PAIN, CHRONIC 12/18/2008   Voncille Lo, PT Exercise Expert for the Aging Adult  06/01/16 11:17 AM Phone: [phone] Fax: [place] [facility] [place] [address] [place], [place], [address] Phone: [phone]   Fax:  [phone]  Name: ARVA YOUSIF MRN: [id number] Date of Birth: 22-Mar-1951

## 2016-06-01 NOTE — Telephone Encounter (Signed)
Patient saw you on 05-26-16 and she said that if she did not improve, that you wanted to know. She said she is right back to where she was when she saw you with an infected nose and sore throat. She would like to know what you would like her to do.

## 2016-06-01 NOTE — Patient Instructions (Signed)
.  bridgBridge     Engage Abdominals.   Lie back, legs bent. Inhale, pressing hips up. Keeping ribs in, lengthen lower back. Exhale, rolling down along spine from top. Repeat __10 x 2__ times. Do _1-2__ sessions per day.  http://pm.exer.us/55   Copyright  VHI. All rights reserved.   Abduction: Clam (Eccentric) - Side-Lying    Engage Abdominals.   Lie on side with knees bent. Lift top knee, keeping feet together  With red t band. Marland Kitchen Keep trunk steady and quiet. Slowly separate knees  for-5 seconds. _10__ reps per set, _2__ sets per day, __6-7_ days per week.   http://ecce.exer.us/65   Copyright  VHI. All rights reserved.   Voncille Lo, PT Exercise Expert for the Aging Adult  06/01/16 9:10 AM Phone: [phone] Fax: [phone]

## 2016-06-01 NOTE — Telephone Encounter (Signed)
Please call in a prescription for azithromycin 500 mg once a day for 3 days plus prednisone 20 mg one time per day for 3 days

## 2016-06-01 NOTE — Telephone Encounter (Signed)
Prescription has been sent in and patient is aware.  

## 2016-06-03 ENCOUNTER — Ambulatory Visit: Payer: Medicare Other | Admitting: Physical Therapy

## 2016-06-03 DIAGNOSIS — R05 Cough: Secondary | ICD-10-CM | POA: Diagnosis not present

## 2016-06-03 DIAGNOSIS — J4541 Moderate persistent asthma with (acute) exacerbation: Secondary | ICD-10-CM | POA: Diagnosis not present

## 2016-06-03 DIAGNOSIS — J9801 Acute bronchospasm: Secondary | ICD-10-CM | POA: Diagnosis not present

## 2016-06-07 DIAGNOSIS — R05 Cough: Secondary | ICD-10-CM | POA: Diagnosis not present

## 2016-06-07 DIAGNOSIS — J9801 Acute bronchospasm: Secondary | ICD-10-CM | POA: Diagnosis not present

## 2016-06-07 DIAGNOSIS — R0981 Nasal congestion: Secondary | ICD-10-CM | POA: Diagnosis not present

## 2016-06-11 ENCOUNTER — Ambulatory Visit: Payer: Medicare Other | Attending: Internal Medicine | Admitting: Physical Therapy

## 2016-06-11 ENCOUNTER — Telehealth: Payer: Self-pay | Admitting: Physical Therapy

## 2016-06-11 DIAGNOSIS — M79672 Pain in left foot: Secondary | ICD-10-CM | POA: Diagnosis not present

## 2016-06-11 DIAGNOSIS — M797 Fibromyalgia: Secondary | ICD-10-CM | POA: Insufficient documentation

## 2016-06-11 DIAGNOSIS — R262 Difficulty in walking, not elsewhere classified: Secondary | ICD-10-CM | POA: Insufficient documentation

## 2016-06-11 DIAGNOSIS — M79671 Pain in right foot: Secondary | ICD-10-CM | POA: Diagnosis not present

## 2016-06-11 DIAGNOSIS — M6281 Muscle weakness (generalized): Secondary | ICD-10-CM | POA: Diagnosis not present

## 2016-06-11 NOTE — Telephone Encounter (Signed)
Left message regarding no show appointment this morning. Confirmed next appointment and asked her to call us if she needs to reschedule.

## 2016-06-15 ENCOUNTER — Ambulatory Visit (INDEPENDENT_AMBULATORY_CARE_PROVIDER_SITE_OTHER): Payer: Medicare Other | Admitting: Pulmonary Disease

## 2016-06-15 ENCOUNTER — Encounter: Payer: Self-pay | Admitting: Pulmonary Disease

## 2016-06-15 ENCOUNTER — Ambulatory Visit: Payer: Medicare Other | Admitting: Physical Therapy

## 2016-06-15 VITALS — BP 120/68 | HR 97 | Ht 64.0 in | Wt 167.4 lb

## 2016-06-15 DIAGNOSIS — R262 Difficulty in walking, not elsewhere classified: Secondary | ICD-10-CM

## 2016-06-15 DIAGNOSIS — B370 Candidal stomatitis: Secondary | ICD-10-CM | POA: Diagnosis not present

## 2016-06-15 DIAGNOSIS — J4521 Mild intermittent asthma with (acute) exacerbation: Secondary | ICD-10-CM

## 2016-06-15 DIAGNOSIS — M797 Fibromyalgia: Secondary | ICD-10-CM

## 2016-06-15 DIAGNOSIS — M6281 Muscle weakness (generalized): Secondary | ICD-10-CM

## 2016-06-15 DIAGNOSIS — M79671 Pain in right foot: Secondary | ICD-10-CM

## 2016-06-15 DIAGNOSIS — J309 Allergic rhinitis, unspecified: Secondary | ICD-10-CM

## 2016-06-15 DIAGNOSIS — K219 Gastro-esophageal reflux disease without esophagitis: Secondary | ICD-10-CM

## 2016-06-15 DIAGNOSIS — M79672 Pain in left foot: Secondary | ICD-10-CM | POA: Diagnosis not present

## 2016-06-15 DIAGNOSIS — J45901 Unspecified asthma with (acute) exacerbation: Secondary | ICD-10-CM | POA: Insufficient documentation

## 2016-06-15 MED ORDER — NYSTATIN 100000 UNIT/ML MT SUSP: 5.0000 mL | Freq: Four times a day (QID) | OROMUCOSAL | 1 refills | Status: DC

## 2016-06-15 MED ORDER — BUDESONIDE-FORMOTEROL FUMARATE 80-4.5 MCG/ACT IN AERO: 2.0000 | INHALATION_SPRAY | Freq: Two times a day (BID) | RESPIRATORY_TRACT | 0 refills | Status: DC

## 2016-06-15 NOTE — Therapy (Signed)
[place]ina [place]le[place], [place], [address] Phone: [phone]   Fax:  [phone]  Physical Therapy Treatment  Patient Details  Name: Christina Deleon MRN: [id number] Date of Birth: 03-Jan-1951 Referring Provider: Bo Merino  Encounter Date: 06/15/2016      PT End of Session - 06/15/16 0935    Visit Number 3   Number of Visits 16   Date for PT Re-Evaluation 07/20/16   Authorization Type [employer]   Authorization Time Period 07-20-16   PT Start Time 0933   PT Stop Time 1016   PT Time Calculation (min) 43 min   Activity Tolerance Patient tolerated treatment well   Behavior During Therapy [facility] for tasks assessed/performed      Past Medical History:  Diagnosis Date  . Adenomatous colon polyp 10/1997, and 03/2009  . Allergic rhinitis   . Allergy   . Arthritis    osteo  . Asthma   . Chronic gastritis   . Fibromyalgia   . GERD (gastroesophageal reflux disease)   . Hemorrhoids   . Hyperlipidemia   . Hypertension   . Menorrhagia    partial hysterectomy  . Migraines   . Sleep disturbance   . 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 CATARACT EXTRACTION Bilateral 10/16 & 11/16  . TONSILLECTOMY  age 78  . tooth extract     with bone graft  . Turbinate sinus surgery  2003  . VAGINAL HYSTERECTOMY  1998   secondary to prolapse, ovaries remain    There were no vitals filed for this visit.      Subjective Assessment - 06/15/16 0936    Subjective I have been ill with asthma and have not been able to do my exercises as much as I needed to.  Had respiratory and coughing problems. both my feet are a little better,  the soreness that was there before is not there now.   Pertinent History Fibromylagia since 2012, DDD of neck and lumbar,  chronic left shoulder impingment syndrome, GERD, migraines, asthma,    Limitations Standing;Walking;House hold activities   How long can you sit comfortably? no pain while sitting but sometimes at end of day   Patient Stated Goals less pain in feet ,get a HEP for ankle and legs. be able to care for mom without exacerbating own pain.  start a walking program.   Multiple Pain Sites Yes   Pain Score 2   Pain Location Ankle   Pain Orientation Left   Pain Descriptors / Indicators Tightness   Pain Type Chronic pain   Pain Onset More than a month ago   Pain Frequency Occasional   Aggravating Factors  lie down at night when I first get into bed   Pain Score 0   Pain Location Back   Pain Orientation Left;Right;Mid;Lower   Pain Descriptors / Indicators Aching   Pain Score 0   Pain Location Head                         [facility] Adult PT Treatment/Exercise - 06/15/16 0941      Self-Care   Self-Care Other Self-Care Comments   Other Self-Care Comments  Community wellness and timing of exercises for max benefit.  AM and PM  fibromyalgia support group, [facility]     Knee/Hip Exercises: Supine   Constance Haw  Strengthening;2 sets;10 reps   Bridges Limitations VC for AB engagement   [employer] with [employer] 1 set;15 reps;Strengthening     Knee/Hip Exercises: Sidelying   Clams 2 x 10 with red t band on right and left in sidelying with abdomial engagement  Pt required VC intiially for correct execution     Manual Therapy   Manual Therapy Soft tissue mobilization   Soft tissue mobilization IASTYM bil gastroc and plantar fascia at insertion     Ankle Exercises: Stretches   Plantar Fascia Stretch 3 reps  30 sec each   Soleus Stretch 3 reps;30 seconds   Soleus Stretch Limitations bil   Gastroc Stretch 3 reps;30 seconds   Gastroc Stretch Limitations bil     Ankle Exercises: Standing   Heel Raises 3 seconds   Heel Raises Limitations bil 20 x  then SL heel raise with UE support  9x on right then 10,  Left 12 x then additional 8.  Pt noted burning  sensation with exertion more than 9 reps.   Other Standing Ankle Exercises Plantar flexion heel lift- Lowering Double to single leg  right and left 10x 1 each  on step   Other Standing Ankle Exercises 15x 2 PF heel lift bil and then lower with single leg 15 x 2 right and left                PT Education - 06/15/16 0949    Education provided Yes   Education Details added eccentric plantarflexion on floor and step single leg , discussed need for utilizing stretches before bed and when getting up in AM   Person(s) Educated Patient   Methods Explanation;Demonstration;Tactile cues;Verbal cues;Handout   Comprehension Verbalized understanding;Returned demonstration          PT Short Term Goals - 06/01/16 1114      PT SHORT TERM GOAL #1   Title pt will be independent with intial HEP   Time 4   Period Weeks   Status On-going     PT SHORT TERM GOAL #2   Title Pt will be able to walk for 15 minutes without exacerbating pain in bil ankle/feet   Time 4   Period Weeks   Status On-going     PT SHORT TERM GOAL #3   Title Pt will be able to walk without a wide base stance due to pain in bil feet   Time 4   Period Weeks   Status On-going           PT Long Term Goals - 06/01/16 1115      PT LONG TERM GOAL #1   Title "Pt will be independent with advanced HEP   Time 8   Period Weeks   Status On-going     PT LONG TERM GOAL #2   Title "Pt will improve bil LE muscle strength to >/= 4+/5 with </= 2/10 pain to promote safety with walking/standing activities   Time 8   Period Weeks   Status On-going     PT LONG TERM GOAL #3   Title "FOTO will improve from  65%limitation  to45% limitation    indicating improved functional mobility .    Time 8   Period Weeks   Status On-going     PT LONG TERM GOAL #4   Title "Pt will be able to negotiate steps without exacerbation of pain greater than 2/10    Time 8   Period Weeks   Status On-going  PT LONG TERM GOAL #5   Title "Pt  will tolerate standing and walking for 1 hour without increased pain in order to care for 26 yo mother   Time 8   Period Weeks   Status On-going     PT LONG TERM GOAL #6   Title Pt will be able to increase gait velocity to at least 2.62 ft/sec in order to ambulate at community level and begin home walking program for exercises   Time 8   Period Weeks   Status On-going     PT LONG TERM GOAL #7   Title Pt will be educated  on community resources for fibromyalgia and be aware of community wellness aquatics program   Time 8   Period Weeks   Status On-going               Plan - 06/15/16 1016    Clinical Impression Statement Pt returns with no back or head pain but 2/10 pain bil plantar fascial pain. Pt reports 50% improved from last visit.  Pt with weakness in plantarflexors . able to heel raise and difficulty lowering with single leg.  Pt will work on at home.  Pt educated about community well ness programs especiall Fibromyalgia support group and Aquatics at [facility]  Will continue.  Pt had not come due to asthma flare up.    Rehab Potential Good   PT Frequency 2x / week   PT Duration 8 weeks   PT Treatment/Interventions ADLs/Self Care Home Management;Cryotherapy;Electrical Stimulation;Iontophoresis 4mg /ml Dexamethasone;Stair training;Gait training;Ultrasound;Moist Heat;Therapeutic exercise;Neuromuscular re-education;Patient/family education;Passive range of motion;Manual techniques;Dry needling;Taping   PT Next Visit Plan   review foam rolling use.  Pre pilates for back and core, continue manual with IASTYM   PT Home Exercise Plan gastroc stretch, soleus stretch in standing  seated gastroc with towel bridging, sidelying clam abdominal engagement eccentric plantar heel raise on floor   Consulted and Agree with Plan of Care Patient      Patient will benefit from skilled therapeutic intervention in order to improve the following deficits and impairments:  Difficulty walking,  Decreased mobility, Decreased range of motion, Decreased strength, Impaired flexibility, Increased fascial restricitons, Pain, Postural dysfunction, Improper body mechanics  Visit Diagnosis: Foot pain, bilateral  Difficulty in walking, not elsewhere classified  Fibromyalgia  Muscle weakness (generalized)     Problem List Patient Active Problem List   Diagnosis Date Noted  . Fibromyalgia 04/19/2016  . Other fatigue 04/19/2016  . 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 Primary osteoarthritis of both knees 04/19/2016  . Leg pain 01/22/2016  . Restless legs syndrome 01/22/2016  . Snoring 11/19/2015  . Periodic limb movement sleep disorder 11/19/2015  . GERD (gastroesophageal reflux disease) 10/08/2015  . Mild persistent asthma 04/09/2015  . LPRD (laryngopharyngeal reflux disease) 04/09/2015  . Allergic rhinoconjunctivitis 04/09/2015  . HEMATOCHEZIA 04/01/2010  . COUGH 04/01/2010  . GASTRITIS 09/30/2009  . NAUSEA 09/30/2009  . ABDOMINAL PAIN-EPIGASTRIC 09/30/2009  . DYSTHYMIC DISORDER 09/26/2009  . PALPITATIONS 06/17/2009  . SHOULDER IMPINGEMENT SYNDROME, LEFT 06/04/2009  . CONSTIPATION 02/05/2009  . PERSONAL HX COLONIC POLYPS 02/05/2009  . ASTHMA, PERSISTENT, MILD 02/04/2009  . CHEST PAIN, ATYPICAL 02/04/2009  . COLONIC POLYPS, BENIGN 12/18/2008  . HYPERLIPIDEMIA 12/18/2008  . Insomnia 12/18/2008  . MIGRAINE, COMMON 12/18/2008  . HYPERTENSION 12/18/2008  . ALLERGIC RHINITIS 12/18/2008  . GERD 12/18/2008  . Primary osteoarthritis of both hands 12/18/2008  . DJD (degenerative joint disease), cervical 12/18/2008  . NECK PAIN, CHRONIC 12/18/2008  .  LOW BACK PAIN, CHRONIC 12/18/2008    Voncille Lo, PT Exercise Expert for the Aging Adult  06/15/16 11:16 AM Phone: [phone] Fax: [facility] [address] [place], [place], [address] Phone: [phone]   Fax:  [phone]  Name: MARLON OVER MRN:  [id number] Date of Birth: [birth year]

## 2016-06-15 NOTE — Patient Instructions (Signed)
   Use the Symbicort inhaler 2 puffs twice daily in place of your Asmanex.  Call or e-mail me if your cough is not getting better.  Continue taking your other medications as prescribed.  I will see you back in a couple of weeks to see how you are doing.

## 2016-06-15 NOTE — Progress Notes (Signed)
Subjective:    Patient ID: Christina Deleon, female    DOB: [birth year], 66 y.o.   MRN: [id number]  C.C.:  Follow-up for Mild, Persistent Asthma, GERD, & Chronic Allergic Rhinitis.  HPI  Mild, Persistent Asthma:  Prescribed Asmanex and Singulair. Patient received an IM injection of steroids approximately 3 weeks ago for exacerbation. She reports her wheezing & cough recurred after 4-5 days. She was subsequently prescribed a course of antibiotics & a prednisone taper. She reports increased dyspnea. She is waking up at night coughing & wheezing. She is using her rescue inhaler at least once daily recently.   GERD:  Patient does follow with GI. Currently prescribed Zantac and Prilosec. She reports increased reflux & dyspepsia with her round of medications. She is having morning brash water taste.   Chronic Allergic Rhinitis:  Prescribed Zyrtec, singular, and Flonase. On Flonase, Singulair, & Zyrtec. She is still having having intermittent sinus congestion & drainage. She reports her sinus congestion & drainage improved temporarily. She then had recurrence of symptoms. She reports her sinus congestion has improved and decreased pressure and pain.   Review of Systems  She reports intermittent chest "tightness" relieved with her rescue inhaler. No chest pain today. No fever or chills. She reports she has had intermittent sinus headaches. No change in vision.   Allergies  Allergen Reactions  . Trazodone Hcl Other (See Comments)    REACTION: bad headaches  . Penicillins Nausea Only and Rash    Patient has taken Amoxicillin without an issue, per patient. Has patient had a PCN reaction causing immediate rash, facial/tongue/throat swelling, SOB or lightheadedness with hypotension: No Has patient had a PCN reaction causing severe rash involving mucus membranes or skin necrosis: No Has patient had a PCN reaction that required hospitalization No Has patient had a PCN reaction occurring within the last 10  years: No If all of the above answers are "NO", then may proceed with Cephalosporin use.     Current Outpatient Prescriptions on File Prior to Visit  Medication Sig Dispense Refill  . albuterol (PROAIR HFA) 108 (90 BASE) MCG/ACT inhaler Inhale 2 puffs into the lungs every 6 (six) hours as needed for wheezing or shortness of breath. 1 Inhaler 1  . Ascorbic Acid (VITAMIN C PO) Take 1 tablet by mouth daily.    Marland Kitchen CALCIUM PO Take 1 tablet by mouth daily.    . candesartan (ATACAND) 16 MG tablet Take 8 mg by mouth at bedtime.     . cetirizine (ZYRTEC) 10 MG tablet Take 1 tablet (10 mg total) by mouth daily. One tab daily for allergies 30 tablet 1  . Cyanocobalamin (VITAMIN B-12 PO) Take 1 tablet by mouth daily.    . Estrogens, Conjugated (PREMARIN VA) Place vaginally.    . fluticasone (FLONASE) 50 MCG/ACT nasal spray instill 2 sprays into each nostril once daily 16 g 3  . methocarbamol (ROBAXIN) 500 MG tablet Take 1 tablet (500 mg total) by mouth daily as needed for muscle spasms. 90 tablet 3  . mometasone (ASMANEX 60 METERED DOSES) 220 MCG/INH inhaler Inhale one dose twice daily to prevent cough or wheeze.  Rinse, gargle, and spit after use. 1 Inhaler 5  . montelukast (SINGULAIR) 10 MG tablet Take 1 tablet (10 mg total) by mouth at bedtime. (Patient taking differently: Take 10 mg by mouth daily. ) 90 tablet 1  . Multiple Vitamin (MULTIVITAMIN) tablet Take 1 tablet by mouth daily. Calcium, Vitamin B-12, Vitamin A, Vitamin C    .  nabumetone (RELAFEN) 500 MG tablet Take 500 mg by mouth 2 (two) times daily as needed for mild pain.    Marland Kitchen omeprazole (PRILOSEC) 40 MG capsule Take 1 capsule (40 mg total) by mouth 2 (two) times daily. 180 capsule 3  . polyethylene glycol powder (GLYCOLAX/MIRALAX) powder Take 17 g by mouth daily. (Patient taking differently: Take 17 g by mouth daily as needed. ) 850 g 1  . pravastatin (PRAVACHOL) 20 MG tablet Take 20 mg by mouth daily.      . ranitidine (ZANTAC) 300 MG tablet  Take 1 tablet (300 mg total) by mouth at bedtime. 30 tablet 11  . RESTASIS MULTIDOSE 0.05 % ophthalmic emulsion     . VITAMIN A PO Take by mouth daily.    Marland Kitchen zolmitriptan (ZOMIG ZMT) 2.5 MG disintegrating tablet Take 1 tablet (2.5 mg total) by mouth as needed for migraine. 10 tablet 11  . zolpidem (AMBIEN) 5 MG tablet TAKE 1-2 TABLETS BY MOUTH AT BEDTIME AS NEEDED 60 tablet 4  . [DISCONTINUED] dexlansoprazole (DEXILANT) 60 MG capsule Take 60 mg by mouth daily.       No current facility-administered medications on file prior to visit.     Past Medical History:  Diagnosis Date  . Adenomatous colon polyp 10/1997, and 03/2009  . Allergic rhinitis   . Allergy   . Arthritis    osteo  . Asthma   . Chronic gastritis   . Fibromyalgia   . GERD (gastroesophageal reflux disease)   . Hemorrhoids   . Hyperlipidemia   . Hypertension   . Menorrhagia    partial hysterectomy  . Migraines   . Sleep disturbance   . 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 CATARACT EXTRACTION Bilateral 10/16 & 11/16  . TONSILLECTOMY  age 84  . tooth extract     with bone graft  . Turbinate sinus surgery  2003  . VAGINAL HYSTERECTOMY  1998   secondary to prolapse, ovaries remain    Family History  Problem Relation Age of Onset  . Throat cancer Father   . Coronary artery disease Father   . Heart attack Father 56  . Stroke Father     during procedure  . Coronary artery disease Mother   . Heart failure Mother   . Hypertension Mother   . Hyperlipidemia Mother   . Pulmonary fibrosis Mother   . Lung disease Mother   . Prostate cancer Brother   . Heart disease Brother   . Diabetes Paternal Aunt     x 2  . Diabetes Paternal Grandmother   . Coronary artery disease Paternal Grandmother   . Stroke Maternal Grandmother     or MI  . Prostate cancer Paternal Uncle     Social History   Social History  . Marital  status: Married    Spouse name: N/A  . Number of children: 2  . Years of education: N/A   Occupational History  . Retired      Metallurgist unemployed sincesince 6/10   Social History Main Topics  . Smoking status: Passive Smoke Exposure - Never Smoker  . Smokeless tobacco: Never Used     Comment: Father growing up  . Alcohol use No  . Drug use: No  . Sexual activity: Yes    Partners: Male    Birth control/ protection: Surgical     Comment: hysterectomy  Other Topics Concern  . None   Social History Narrative   She lives with husband.  They have 2 grown children.   She is retired Product/process development scientist.   Highest level of education:  2 years of college      Regular exercise, diet of fruits, veggies, limited fried foods, limited water      [facility]:   Originally from [place]. Has also lived in [place]. Previously worked doing Software engineer. No pets currently. No bird, mold, or hot tub exposure. Does have a musty smell in their home but it is new. Enjoys reading. 1 indoor plant. Has carpet in her home including in the bedroom. Also has draperies.       Objective:   Physical Exam BP 120/68 (BP Location: Right Arm, Patient Position: Sitting, Cuff Size: Normal)   Pulse 97   Ht 5\' 4"  (1.626 m)   Wt 167 lb 6.4 oz (75.9 kg)   LMP 07/02/1994   SpO2 100%   BMI 28.73 kg/m   General:  Awake. Alert. No acute distress. Accompanied by husband today.  Integument:  Warm & dry. No rash on exposed skin.  Extremities:  No cyanosis or clubbing.  HEENT:  Moist mucus membranes. No oral ulcers. Minimal bilateral nasal turbinate swelling.  Cardiovascular:  Regular rate. No edema. Normal S1 & S2. Pulmonary:  Overall good aeration and clear to auscultation. No accessory muscle use on room air. Abdomen: Soft. Normal bowel sounds. Nontender. Musculoskeletal:  Normal bulk and tone. No joint deformity or effusion appreciated.  PFT 03/15/16: FVC  2.33 L (93%) FEV1 1.18 L (93%) FEV1/FVC 0.78 FEF 25-75 1.54 L (83%) negative bronchodilator response 11/19/15: FVC 2.54 L (101%) FEV1 2.10 L (107%) FEV1/FVC 0.83 FEF 25-75 2.16 L (116%) negative bronchodilator response TLC 4.68 L (92%) RV 100% ERV 115% DLCO uncorrected 71% 09/23/15: FVC 2.20 L (92%) FEV1 1.82 L (96%) FEV1/FVC 0.82 FEF 25-75 2.00 L (89%)  6MWT 11/25/15:  Walked 512 meters / Baseline Sat 99% on RA / Nadir Sat 99% on RA (complained of dizziness, hip pain, & calf pain at end of walk)  POLYSOMNOGRAM (11/17/15): AHI 0.7 events/hour. Lower saturation 92%. Loud snoring noted. Periodic limb movement index to 85.38. Arousal associated with leg movement index was 26.3. Recommended assessment for restless leg syndrome.  IMAGING CXR PA/LAT 09/05/15 (previously reviewed by me): No parenchymal nodule or opacity appreciated. Heart normal in size & mediastinum normal in contour. No pleural effusion.  LABS 10/08/15 IgE: 149 RAST Panel: Negative    Assessment & Plan:  66 y.o. Female with mild, persistent asthma, GERD, and chronic allergic rhinitis. Patient seemed to have a mild exacerbation which could be simply a post viral cough syndrome. Her reflux seems to have been worsened by her recent steroid tapers. At this time I do not feel adjusting her sinus regimen would be of great benefit. I am going to treat her oral thrush which may also improve some of her cough. I instructed patient to notify me if her symptoms did not improve.  1. Mild, Persistent Asthma: Switching from Asmanex 2 Symbicort 80/4.5. Patient to notify me if cough and symptoms do not improve significantly. 2. Oral Thrush: Prescribing nystatin swish and swallow 4 times a day. Patient counseled on appropriate oral hygiene. 3. GERD: Continuing Prilosec & Zantac. No changes. 4. Chronic Allergic Rhinitis:  Patient continuing on current regimen of Flonase, Singulair, and Zyrtec. No changes at this time. 5. Health Maintenance:  S/P  Influenza  Vaccine September 2017, Prevnar Vaccine July 2017, Pneumovax July 2015, & Tdap July 2011.  6. Follow-up: Patient to return to clinic in 2 weeks or sooner if needed.   Sonia Baller Ashok Cordia, M.D. [facility] Pager:  [phone] After 3pm or if no response, call [phone] 3:28 PM 06/15/16

## 2016-06-15 NOTE — Patient Instructions (Addendum)
Plantarflexion: Heel Lift - Lowering (Eccentric) - Double to Single Leg (Wedge / Step)    Stand, toes on step, hold support. Rise up on toes. Lifting unaffected leg, slowly lower only affected heel for 3-5 seconds, into stretch position. _15__ reps per set, _2__ sets per day, _6-7__ days per week.   http://ecce.exer.us/25   Copyright  VHI. All rights reserved.  Plantarflexion: Heel Lift - Lowering (Eccentric) - Double to Single Leg  Start with this exercise first and then do above as you get stronger    Place a quarter under affected big toe joint. Rise up on toes, holding support. Keep weight on quarter. Lift unaffected leg and slowly lower affected heel for 3-5 seconds. _15__ reps per set, _2__ sets per day, __6-7_ days per week.   http://ecce.exer.us/19   Copyright  VHI. All rights reserved.   Voncille Lo, PT Exercise Expert for the Aging Adult  06/15/16 9:49 AM Phone: [phone] Fax: [phone]

## 2016-06-15 NOTE — Addendum Note (Signed)
Addended by: Benson Setting L on: 06/15/2016 04:00 PM   Modules accepted: Orders

## 2016-06-15 NOTE — Addendum Note (Signed)
Addended by: Tyson Dense on: 06/15/2016 03:57 PM   Modules accepted: Orders

## 2016-06-16 ENCOUNTER — Other Ambulatory Visit: Payer: Self-pay | Admitting: Allergy and Immunology

## 2016-06-17 ENCOUNTER — Ambulatory Visit: Payer: Medicare Other | Admitting: Physical Therapy

## 2016-06-17 DIAGNOSIS — M79671 Pain in right foot: Secondary | ICD-10-CM

## 2016-06-17 DIAGNOSIS — M797 Fibromyalgia: Secondary | ICD-10-CM

## 2016-06-17 DIAGNOSIS — R262 Difficulty in walking, not elsewhere classified: Secondary | ICD-10-CM

## 2016-06-17 DIAGNOSIS — M6281 Muscle weakness (generalized): Secondary | ICD-10-CM | POA: Diagnosis not present

## 2016-06-17 DIAGNOSIS — M79672 Pain in left foot: Secondary | ICD-10-CM | POA: Diagnosis not present

## 2016-06-17 NOTE — Therapy (Signed)
[place], [place], [address] Phone: [phone]   Fax:  [phone]  Physical Therapy Treatment  Patient Details  Name: Christina Deleon MRN: [id number] Date of Birth: 1951-01-05 Referring Provider: Bo Merino  Encounter Date: 06/17/2016      Christina Deleon End of Session - 06/17/16 1039    Visit Number 4   Number of Visits 16   Date for Christina Deleon Re-Evaluation 07/20/16   Authorization Type [employer]   Authorization Time Period 07-20-16   Christina Deleon Start Time 0930   Christina Deleon Stop Time 1014   Christina Deleon Time Calculation (min) 44 min   Activity Tolerance Patient tolerated treatment well   Behavior During Therapy [facility] for tasks assessed/performed      Past Medical History:  Diagnosis Date  . Adenomatous colon polyp 10/1997, and 03/2009  . Allergic rhinitis   . Allergy   . Arthritis    osteo  . Asthma   . Chronic gastritis   . Fibromyalgia   . GERD (gastroesophageal reflux disease)   . Hemorrhoids   . Hyperlipidemia   . Hypertension   . Menorrhagia    partial hysterectomy  . Migraines   . Sleep disturbance   . 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 CATARACT EXTRACTION Bilateral 10/16 & 11/16  . TONSILLECTOMY  age 64  . tooth extract     with bone graft  . Turbinate sinus surgery  2003  . VAGINAL HYSTERECTOMY  1998   secondary to prolapse, ovaries remain    There were no vitals filed for this visit.      Subjective Assessment - 06/17/16 0937    Subjective I have a headache today and I dont feel good today.     Pertinent History Fibromylagia since 2012, DDD of neck and lumbar,  chronic left shoulder impingment syndrome, GERD, migraines, asthma,    Currently in Pain? Yes   Pain Score 1    Pain Location Ankle   Pain Orientation Right   Pain Descriptors / Indicators Tightness   Pain Score 1   Pain Location Ankle    Pain Orientation Left   Pain Descriptors / Indicators Tightness   Pain Score 3   Pain Location Back   Pain Orientation Right;Left;Mid;Lower   Pain Descriptors / Indicators Aching   Pain Type Chronic pain   Pain Onset More than a month ago   Pain Score 5   Pain Location Head  pre migraine   Pain Orientation Right;Left                         [facility] Adult Christina Deleon Treatment/Exercise - 06/17/16 0950      Self-Care   Self-Care Other Self-Care Comments   Other Self-Care Comments  use of foam roller for self myofascial release at home     Knee/Hip Exercises: Standing   Other Standing Knee Exercises 5 way mini lunge with forward, side 90 degree turn backward and forward and curtsy  x 10 each wit UE support at counter as needed     Knee/Hip Exercises: Supine   Bridges Strengthening;2 sets;10 reps   Bridges Limitations VC for AB engagement   Bridges with [employer] 1 set;15 reps;Strengthening   Other Supine Knee/Hip Exercises pre-pilates exercises pelvic tilt, Isometric Hold with pelvic floor , bent knee raise , supine clam and heel slide  x 10 each right and left  VC for breathing and technique     Knee/Hip Exercises: Sidelying   Clams --     Manual Therapy   Manual Therapy Soft tissue mobilization   Soft tissue mobilization IASTYM bil gastroc and plantar fascia at insertion     Ankle Exercises: Stretches   Plantar Fascia Stretch 3 reps  30 sec each   Soleus Stretch 3 reps;30 seconds   Soleus Stretch Limitations bil   Gastroc Stretch 3 reps;30 seconds   Gastroc Stretch Limitations bil     Ankle Exercises: Standing   Heel Raises 3 seconds   Heel Raises Limitations bil 20 x  then SL heel raise with UE support  9x on right then 10,  .   Other Standing Ankle Exercises Plantar flexion heel lift- Lowering Double to single leg  right and left 10x 1 each  on step   Other Standing Ankle Exercises 15x 2 PF heel lift bil and then lower with single leg 15 x 2 right and left                 Christina Deleon Education - 06/17/16 0949    Education provided Yes   Education Details added pre pilates core to HEP   Person(s) Educated Patient   Methods Explanation;Demonstration;Tactile cues;Verbal cues;Handout   Comprehension Verbalized understanding;Returned demonstration          Christina Deleon Short Term Goals - 06/17/16 0944      Christina Deleon SHORT TERM GOAL #1   Title Christina Deleon will be independent with intial HEP   Time 4   Period Weeks   Status Achieved     Christina Deleon SHORT TERM GOAL #2   Title Christina Deleon will be able to walk for 15 minutes without exacerbating pain in bil ankle/feet   Baseline Able to walk around the house for 15 minutes   Time 4   Period Weeks   Status Achieved     Christina Deleon SHORT TERM GOAL #3   Title Christina Deleon will be able to walk without a wide base stance due to pain in bil feet   Baseline Christina Deleon able to walk with normalized step through gait   Time 4   Period Weeks   Status Achieved           Christina Deleon Long Term Goals - 06/17/16 0946      Christina Deleon LONG TERM GOAL #1   Title "Christina Deleon will be independent with advanced HEP   Baseline added to pre pilates and core today   Time 8   Period Weeks   Status On-going     Christina Deleon LONG TERM GOAL #2   Title "Christina Deleon will improve bil LE muscle strength to >/= 4+/5 with </= 2/10 pain to promote safety with walking/standing activities   Time 8   Period Weeks   Status On-going     Christina Deleon LONG TERM GOAL #3   Title "FOTO will improve from  65%limitation  to45% limitation    indicating improved functional mobility .    Time 8   Period Weeks   Status On-going     Christina Deleon LONG TERM GOAL #4   Title "Christina Deleon will be able to negotiate steps without exacerbation of pain greater than 2/10    Time 8   Period Weeks               Plan - 06/17/16 1000    Clinical Impression Statement Christina Deleon is able to walk with normal step through gait for  15 minutes in the house. She is independent with intial HEP and has completed all STG's  She still has pain in gastrocs but greately reduced since  evaluation.  Christina Deleon entered clinic today with pre- migraine.  Christina Deleon is working on core/lumbar stability as well as bil ankle pain for fibromyaligia symptoms.  Christina Deleon is also encouraged to join aquatic programa at [place]   Christina Deleon Frequency 2x / week   Christina Deleon Duration 8 weeks   Christina Deleon Treatment/Interventions ADLs/Self Care Home Management;Cryotherapy;Electrical Stimulation;Iontophoresis 4mg /ml Dexamethasone;Stair training;Gait training;Ultrasound;Moist Heat;Therapeutic exercise;Neuromuscular re-education;Patient/family education;Passive range of motion;Manual techniques;Dry needling;Taping   Christina Deleon Next Visit Plan   review foam rolling use.  progress LE strength and core, continue manual with IASTYM   Christina Deleon Home Exercise Plan gastroc stretch, soleus stretch in standing  seated gastroc with towel bridging, sidelying clam abdominal engagement eccentric plantar heel raise on floor, pre pilates   Consulted and Agree with Plan of Care Patient      Patient will benefit from skilled therapeutic intervention in order to improve the following deficits and impairments:  Difficulty walking, Decreased mobility, Decreased range of motion, Decreased strength, Impaired flexibility, Increased fascial restricitons, Pain, Postural dysfunction, Improper body mechanics  Visit Diagnosis: Foot pain, bilateral  Difficulty in walking, not elsewhere classified  Fibromyalgia  Muscle weakness (generalized)     Problem List Patient Active Problem List   Diagnosis Date Noted  . Asthma with acute exacerbation 06/15/2016  . Oral thrush 06/15/2016  . Fibromyalgia 04/19/2016  . Other fatigue 04/19/2016  . 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 Primary osteoarthritis of both knees 04/19/2016  . Leg pain 01/22/2016  . Restless legs syndrome 01/22/2016  . Snoring 11/19/2015  . Periodic limb movement sleep disorder 11/19/2015  . GERD (gastroesophageal reflux disease) 10/08/2015  . Mild persistent asthma 04/09/2015  . LPRD  (laryngopharyngeal reflux disease) 04/09/2015  . Allergic rhinoconjunctivitis 04/09/2015  . HEMATOCHEZIA 04/01/2010  . COUGH 04/01/2010  . GASTRITIS 09/30/2009  . NAUSEA 09/30/2009  . ABDOMINAL PAIN-EPIGASTRIC 09/30/2009  . DYSTHYMIC DISORDER 09/26/2009  . PALPITATIONS 06/17/2009  . SHOULDER IMPINGEMENT SYNDROME, LEFT 06/04/2009  . CONSTIPATION 02/05/2009  . PERSONAL HX COLONIC POLYPS 02/05/2009  . ASTHMA, PERSISTENT, MILD 02/04/2009  . CHEST PAIN, ATYPICAL 02/04/2009  . COLONIC POLYPS, BENIGN 12/18/2008  . HYPERLIPIDEMIA 12/18/2008  . Insomnia 12/18/2008  . MIGRAINE, COMMON 12/18/2008  . HYPERTENSION 12/18/2008  . ALLERGIC RHINITIS 12/18/2008  . GERD 12/18/2008  . Primary osteoarthritis of both hands 12/18/2008  . DJD (degenerative joint disease), cervical 12/18/2008  . NECK PAIN, CHRONIC 12/18/2008  . LOW BACK PAIN, CHRONIC 12/18/2008    Christina Deleon, Christina Deleon Exercise Expert for the Aging Adult  06/17/16 10:42 AM Phone: [phone] Fax: [facility] [address] [place], [place], [address] Phone: [phone]   Fax:  [phone]  Name: Christina Deleon MRN: [id number] Date of Birth: Apr 19, 1951

## 2016-06-17 NOTE — Patient Instructions (Signed)
    PELVIC TILT  Lie on back, legs bent. Exhale, tilting top of pelvis back, pubic bone up, to flatten lower back. Inhale, rolling pelvis opposite way, top forward, pubic bone down, arch in back. Repeat __10__ times. Do __2__ sessions per day. Copyright  VHI. All rights reserved.    Isometric Hold With Pelvic Floor (Hook-Lying)  Lie with hips and knees bent. Slowly inhale, and then exhale. Pull navel toward spine and tighten pelvic floor. Hold for __10_ seconds. Continue to breathe in and out during hold. Rest for _10__ seconds. Repeat __10_ times. Do __2-3_ times a day.   Knee Fold  Lie on back, legs bent, arms by sides. Exhale, lifting knee to chest. Inhale, returning. Keep abdominals flat, navel to spine. Repeat __10__ times, alternating legs. Do __2__ sessions per day.  Knee Drop  Keep pelvis stable. Without rotating hips, slowly drop knee to side, pause, return to center, bring knee across midline toward opposite hip. Feel obliques engaging. Repeat for ___10_ times each leg.   Copyright  VHI. All rights reserved.       Heel Slide to Straight   Slide one leg down to straight. Return. Be sure pelvis does not rock forward, tilt, rotate, or tip to side. Do _10__ times. Restabilize pelvis. Repeat with other leg. Do __1-2_ sets, __2_ times per day.  http://ss.exer.us/16   Copyright  VHI. All rights reserved.  Voncille Lo, PT Exercise Expert for the Aging Adult  06/17/16 9:49 AM Phone: [phone] Fax: [phone]

## 2016-06-22 ENCOUNTER — Ambulatory Visit: Payer: Medicare Other | Admitting: Physical Therapy

## 2016-06-22 ENCOUNTER — Encounter: Payer: Self-pay | Admitting: Physical Therapy

## 2016-06-22 DIAGNOSIS — M79672 Pain in left foot: Principal | ICD-10-CM

## 2016-06-22 DIAGNOSIS — M79671 Pain in right foot: Secondary | ICD-10-CM

## 2016-06-22 DIAGNOSIS — R262 Difficulty in walking, not elsewhere classified: Secondary | ICD-10-CM | POA: Diagnosis not present

## 2016-06-22 DIAGNOSIS — M6281 Muscle weakness (generalized): Secondary | ICD-10-CM | POA: Diagnosis not present

## 2016-06-22 DIAGNOSIS — M797 Fibromyalgia: Secondary | ICD-10-CM | POA: Diagnosis not present

## 2016-06-22 NOTE — Therapy (Signed)
[place], [place], [address] Phone: [phone]   Fax:  [phone]  Physical Therapy Treatment  Patient Details  Name: Christina Deleon MRN: [id number] Date of Birth: May 22, 1950 Referring Provider: Bo Merino  Encounter Date: 06/22/2016      PT End of Session - 06/22/16 1140    Visit Number 5   Number of Visits 16   Date for PT Re-Evaluation 07/20/16   Authorization Type [employer] Medicare   Authorization Time Period 07-20-16   PT Start Time 0931   PT Stop Time 1015   PT Time Calculation (min) 44 min   Activity Tolerance Patient tolerated treatment well   Behavior During Therapy [facility] for tasks assessed/performed      Past Medical History:  Diagnosis Date  . Adenomatous colon polyp 10/1997, and 03/2009  . Allergic rhinitis   . Allergy   . Arthritis    osteo  . Asthma   . Chronic gastritis   . Fibromyalgia   . GERD (gastroesophageal reflux disease)   . Hemorrhoids   . Hyperlipidemia   . Hypertension   . Menorrhagia    partial hysterectomy  . Migraines   . Sleep disturbance   . 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 CATARACT EXTRACTION Bilateral 10/16 & 11/16  . TONSILLECTOMY  age 42  . tooth extract     with bone graft  . Turbinate sinus surgery  2003  . VAGINAL HYSTERECTOMY  1998   secondary to prolapse, ovaries remain    There were no vitals filed for this visit.      Subjective Assessment - 06/22/16 0932    Subjective I had a slight headache today.  Today is one of my better days. I have been home with a headache over the weekend. I had a flare up with fibromylagia   Pertinent History Fibromylagia since 2012, DDD of neck and lumbar,  chronic left shoulder impingment syndrome, GERD, migraines, asthma,    Limitations Standing;Walking;House hold activities   How long can you sit  comfortably? no pain while sitting but sometimes at end of day   Currently in Pain? Yes   Pain Score 1    Pain Location Ankle   Pain Orientation Right   Pain Type Chronic pain   Pain Onset More than a month ago   Pain Frequency Occasional   Pain Score 1   Pain Location Ankle   Pain Orientation Left   Pain Descriptors / Indicators Tightness   Pain Type Chronic pain   Pain Onset More than a month ago   Pain Frequency Occasional   Pain Score 3   Pain Location Back   Pain Orientation Right;Left;Mid;Lower   Pain Descriptors / Indicators Aching   Pain Type Chronic pain   Pain Onset More than a month ago   Pain Frequency Intermittent   Pain Score 1   Pain Location Head   Pain Orientation Right;Left   Pain Descriptors / Indicators Throbbing            [facility] PT Assessment - 06/22/16 0954      Observation/Other Assessments   Focus on Therapeutic Outcomes (FOTO)  FOTO intake 60 limitation 40% predicted 45%     AROM   Right Ankle Dorsiflexion 8   Right Ankle Plantar Flexion 60   Right Ankle Inversion 25   Right Ankle Eversion 16  Left Ankle Dorsiflexion 10   Left Ankle Plantar Flexion 64   Left Ankle Inversion 25   Left Ankle Eversion 15   Lumbar Flexion 70   Lumbar Extension 17   Lumbar - Right Side Bend 26   Lumbar - Left Side Bend 22   Lumbar - Right Rotation 75%   Lumbar - Left Rotation 75%     Strength   Right Hip Flexion 4-/5   Right Hip Extension 4-/5   Right Hip ABduction 4-/5   Left Hip Flexion 4/5   Left Hip Extension 4/5   Left Hip ABduction 4-/5   Right Knee Flexion 4/5   Right Knee Extension 4/5   Left Knee Flexion 4/5   Left Knee Extension 4/5   Right Ankle Dorsiflexion 4-/5   Right Ankle Plantar Flexion 4/5   Left Ankle Dorsiflexion 4-/5   Left Ankle Plantar Flexion 4/5     Flexibility   Hamstrings right 64,  left 68                     [facility] Adult PT Treatment/Exercise - 06/22/16 0937      Self-Care   Self-Care Other  Self-Care Comments   Other Self-Care Comments  walking program handout      Knee/Hip Exercises: Standing   Other Standing Knee Exercises church pew soleus sway 20 times     Knee/Hip Exercises: Supine   Bridges Strengthening;2 sets;10 reps   Bridges Limitations VC for AB engagement   Bridges with [employer] 1 set;15 reps   Straight Leg Raises 15 reps  1st hold 30 seconds then reps   Other Supine Knee/Hip Exercises pre-pilates exercises pelvic tilt, Isometric Hold with pelvic floor , bent knee raise , supine clam and heel slide x 10 each right and left  VC for breathing and technique but less than previous visit     Knee/Hip Exercises: Sidelying   Clams 2 x 10 with red t band on right and left in sidelying with abdominal engagement       Manual Therapy   Manual Therapy Soft tissue mobilization   Soft tissue mobilization IASTYM bil gastroc and plantar fascia at insertion     Ankle Exercises: Stretches   Soleus Stretch 3 reps;30 seconds   Soleus Stretch Limitations bil   Gastroc Stretch 3 reps;30 seconds   Gastroc Stretch Limitations bil                PT Education - 06/22/16 0943    Education provided Yes   Education Details added walking program to HEP with timed minutes 3 sets of 10 minutes adding to cumulative 30 -45 minutes  added SLR   Person(s) Educated Patient   Methods Explanation;Handout   Comprehension Verbalized understanding          PT Short Term Goals - 06/17/16 0944      PT SHORT TERM GOAL #1   Title pt will be independent with intial HEP   Time 4   Period Weeks   Status Achieved     PT SHORT TERM GOAL #2   Title Pt will be able to walk for 15 minutes without exacerbating pain in bil ankle/feet   Baseline Able to walk around the house for 15 minutes   Time 4   Period Weeks   Status Achieved     PT SHORT TERM GOAL #3   Title Pt will be able to walk without a wide base stance due to pain  in bil feet   Baseline Pt able to walk with  normalized step through gait   Time 4   Period Weeks   Status Achieved           PT Long Term Goals - 06/22/16 ID:[id number]      PT LONG TERM GOAL #1   Title "Pt will be independent with advanced HEP   Time 8   Period Weeks   Status On-going     PT LONG TERM GOAL #2   Title "Pt will improve bil LE muscle strength to >/= 4+/5 with </= 2/10 pain to promote safety with walking/standing activities   Baseline See Flow sheet.  4-/5 in hip core   Time 8   Period Weeks   Status On-going     PT LONG TERM GOAL #3   Title "FOTO will improve from  65%limitation  to45% limitation    indicating improved functional mobility .    Baseline Foto limitation 40%   Time 8   Period Weeks   Status Achieved     PT LONG TERM GOAL #4   Title "Pt will be able to negotiate steps without exacerbation of pain greater than 2/10    Baseline 2/10 today or less  sometimes more depending on fibromyalgia   Time 8   Period Weeks   Status Achieved     PT LONG TERM GOAL #5   Title "Pt will tolerate standing and walking for 30 minutes without increased pain in order to care for 76 yo mother   Baseline 20-25 minutes   Time 8   Period Weeks   Status Revised     PT LONG TERM GOAL #6   Title Pt will be able to increase gait velocity to at least 2.62 ft/sec in order to ambulate at community level and begin home walking program for exercises   Time 8   Period Weeks   Status On-going     PT LONG TERM GOAL #7   Title Pt will be educated  on community resources for fibromyalgia and be aware of community wellness aquatics program   Time 8   Period Weeks   Status Achieved               Plan - 06/22/16 1136    Clinical Impression Statement Pt able to walk for 20-25 minutes in the house and has WNL AROM of DF/PF ( see flowsheet)  Pt is able to negotiate steps and FOTO limitation 40% ( predicted 45%)  Pt achieve #3 and #4 goals today.  Pt just needs reinforcement of HEP and will be able to DC in 2 visits.    added home walking program and SLR to HEP   Rehab Potential Good   PT Frequency 2x / week   PT Duration 8 weeks   PT Treatment/Interventions ADLs/Self Care Home Management;Cryotherapy;Electrical Stimulation;Iontophoresis 4mg /ml Dexamethasone;Stair training;Gait training;Ultrasound;Moist Heat;Therapeutic exercise;Neuromuscular re-education;Patient/family education;Passive range of motion;Manual techniques;Dry needling;Taping   PT Next Visit Plan   progress LE strength and core, continue manual with IASTYM, reinforce core exercises.  Clamshells, prone hip extension.  supermann   PT Home Exercise Plan gastroc stretch, soleus stretch in standing  seated gastroc with towel bridging, sidelying clam abdominal engagement eccentric plantar heel raise on floor, pre pilates, SLR walking program   Consulted and Agree with Plan of Care Patient      Patient will benefit from skilled therapeutic intervention in order to improve the following deficits and impairments:  Difficulty walking,  Decreased mobility, Decreased range of motion, Decreased strength, Impaired flexibility, Increased fascial restricitons, Pain, Postural dysfunction, Improper body mechanics  Visit Diagnosis: Foot pain, bilateral  Difficulty in walking, not elsewhere classified  Fibromyalgia  Muscle weakness (generalized)     Problem List Patient Active Problem List   Diagnosis Date Noted  . Asthma with acute exacerbation 06/15/2016  . Oral thrush 06/15/2016  . Fibromyalgia 04/19/2016  . Other fatigue 04/19/2016  . 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 Primary osteoarthritis of both knees 04/19/2016  . Leg pain 01/22/2016  . Restless legs syndrome 01/22/2016  . Snoring 11/19/2015  . Periodic limb movement sleep disorder 11/19/2015  . GERD (gastroesophageal reflux disease) 10/08/2015  . Mild persistent asthma 04/09/2015  . LPRD (laryngopharyngeal reflux disease) 04/09/2015  . Allergic rhinoconjunctivitis 04/09/2015  . HEMATOCHEZIA  04/01/2010  . COUGH 04/01/2010  . GASTRITIS 09/30/2009  . NAUSEA 09/30/2009  . ABDOMINAL PAIN-EPIGASTRIC 09/30/2009  . DYSTHYMIC DISORDER 09/26/2009  . PALPITATIONS 06/17/2009  . SHOULDER IMPINGEMENT SYNDROME, LEFT 06/04/2009  . CONSTIPATION 02/05/2009  . PERSONAL HX COLONIC POLYPS 02/05/2009  . ASTHMA, PERSISTENT, MILD 02/04/2009  . CHEST PAIN, ATYPICAL 02/04/2009  . COLONIC POLYPS, BENIGN 12/18/2008  . HYPERLIPIDEMIA 12/18/2008  . Insomnia 12/18/2008  . MIGRAINE, COMMON 12/18/2008  . HYPERTENSION 12/18/2008  . ALLERGIC RHINITIS 12/18/2008  . GERD 12/18/2008  . Primary osteoarthritis of both hands 12/18/2008  . DJD (degenerative joint disease), cervical 12/18/2008  . NECK PAIN, CHRONIC 12/18/2008  . LOW BACK PAIN, CHRONIC 12/18/2008  Voncille Lo, PT Exercise Expert for the Aging Adult  06/22/16 11:42 AM Phone: [phone] Fax: [facility] [address] [place], [place], [address] Phone: [phone]   Fax:  [phone]  Name: Christina Deleon MRN: [id number] Date of Birth: 1950/07/24

## 2016-06-22 NOTE — Patient Instructions (Addendum)
WALKING  Walking is a great form of exercise to increase your strength, endurance and overall fitness.  A walking program can help you start slowly and gradually build endurance as you go.  Everyone's ability is different, so each person's starting point will be different.  You do not have to follow them exactly.  The are just samples. You should simply find out what's right for you and stick to that program.   In the beginning, you'll start off walking 2-3 times a day for short distances.  As you get stronger, you'll be walking further at just 1-2 times per day.  It is recommended by the American Medical Association to accumulate 150 to 180 minutes per week.  A. You Can Walk For A Certain Length Of Time Each Day  [name]  This is a good place to start.    Walk 5 minutes 3 times per day.  Increase 2 minutes every 2 days (3 times per day).  Work up to 25-30 minutes (1-2 times per day).   Example:   Day 1-2 5 minutes 3 times per day   Day 7-8 12 minutes 2-3 times per day   Day 13-14 25 minutes 1-2 times per day  B. You Can Walk For a Certain Distance Each Day     Distance can be substituted for time.    Example:   3 trips to mailbox (at road)   3 trips to corner of block   3 trips around the block  C. Go to local [employer] and use the track.    Walk for distance ____ around track  Or time ____ minutes  D. Walk _x___ Jog ____ Run ___  Please only do the exercises that your therapist has initialed and dated  I still highly recommend Aquatics program when you leave PT  Straight Leg Raise    Tighten stomach and slowly raise locked right leg ___6_ inches from floor.hold for 30 seconds Repeat _15___ times per set. Do _1___ sets per session. Do __1__ sessions per day.  http://orth.exer.us/1103   Copyright  VHI. All rights reserved.    Voncille Lo, PT Exercise Expert for the Aging Adult  06/22/16 9:43 AM Phone: [phone] Fax: [phone]

## 2016-06-24 ENCOUNTER — Ambulatory Visit: Payer: Medicare Other | Admitting: Physical Therapy

## 2016-06-24 DIAGNOSIS — R262 Difficulty in walking, not elsewhere classified: Secondary | ICD-10-CM | POA: Diagnosis not present

## 2016-06-24 DIAGNOSIS — M6281 Muscle weakness (generalized): Secondary | ICD-10-CM

## 2016-06-24 DIAGNOSIS — M79671 Pain in right foot: Secondary | ICD-10-CM | POA: Diagnosis not present

## 2016-06-24 DIAGNOSIS — M79672 Pain in left foot: Secondary | ICD-10-CM | POA: Diagnosis not present

## 2016-06-24 DIAGNOSIS — M797 Fibromyalgia: Secondary | ICD-10-CM

## 2016-06-24 NOTE — Therapy (Signed)
[place], [place], [address] Phone: [phone]   Fax:  [phone]  Physical Therapy Treatment/Discharge Note  Patient Details  Name: Christina Deleon MRN: [id number] Date of Birth: Mar 11, 1951 Referring Provider: Bo Merino  Encounter Date: 06/24/2016      PT End of Session - 06/24/16 0937    Visit Number 6   Number of Visits 16   Date for PT Re-Evaluation 07/20/16   Authorization Type [employer]   Authorization Time Period 07-20-16   PT Start Time 0932   PT Stop Time 1014   PT Time Calculation (min) 42 min   Activity Tolerance Patient tolerated treatment well   Behavior During Therapy [facility] for tasks assessed/performed      Past Medical History:  Diagnosis Date  . Adenomatous colon polyp 10/1997, and 03/2009  . Allergic rhinitis   . Allergy   . Arthritis    osteo  . Asthma   . Chronic gastritis   . Fibromyalgia   . GERD (gastroesophageal reflux disease)   . Hemorrhoids   . Hyperlipidemia   . Hypertension   . Menorrhagia    partial hysterectomy  . Migraines   . Sleep disturbance   . 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 CATARACT EXTRACTION Bilateral 10/16 & 11/16  . TONSILLECTOMY  age 62  . tooth extract     with bone graft  . Turbinate sinus surgery  2003  . VAGINAL HYSTERECTOMY  1998   secondary to prolapse, ovaries remain    There were no vitals filed for this visit.      Subjective Assessment - 06/24/16 0938    Subjective I have a headache today 3/10 but I know I am so much better than when I started.  I am going to go to aquatics for exercise and try to get some help for the headaches   Pertinent History Fibromylagia since 2012, DDD of neck and lumbar,  chronic left shoulder impingment syndrome, GERD, migraines, asthma,    Limitations Standing;Walking;House hold  activities   How long can you sit comfortably? no pain while sitting but sometimes at end of day   How long can you stand comfortably? 20-25 minutes   How long can you walk comfortably? 20-25 minutes   Diagnostic tests none for feet but MRI for back and neck and left shoulder   Patient Stated Goals less pain in feet ,get a HEP for ankle and legs. be able to care for mom without exacerbating own pain.  start a walking program.   Currently in Pain? Yes   Pain Score 1    Pain Location Ankle   Pain Orientation Right   Pain Descriptors / Indicators Tightness   Pain Type Chronic pain   Pain Onset More than a month ago   Pain Score 1   Pain Location Ankle   Pain Orientation Left   Pain Descriptors / Indicators Tightness   Pain Type Chronic pain   Pain Score 3   Pain Location Back   Pain Orientation Right;Left;Mid;Lower   Pain Descriptors / Indicators Aching   Pain Score 3   Pain Location Head   Pain Orientation Right;Left   Pain Descriptors / Indicators Throbbing            [facility] PT Assessment - 06/24/16 1004      Observation/Other Assessments   Focus  on Therapeutic Outcomes (FOTO)  FOTO intake 60 limitation 40% predicted 45%  taken on 06-22-16     AROM   Right Ankle Dorsiflexion 8  all measure ments same as last visit   Right Ankle Plantar Flexion 60   Right Ankle Inversion 25   Right Ankle Eversion 16   Left Ankle Dorsiflexion 10   Left Ankle Plantar Flexion 64   Left Ankle Inversion 25   Left Ankle Eversion 15   Lumbar Flexion 70   Lumbar Extension 17   Lumbar - Right Side Bend 26   Lumbar - Left Side Bend 22   Lumbar - Right Rotation 75%   Lumbar - Left Rotation 75%     Strength   Right Hip Flexion 4-/5   Right Hip Extension 4-/5   Right Hip ABduction 4-/5   Left Hip Flexion 4/5   Left Hip Extension 4/5   Left Hip ABduction 4-/5   Right Knee Flexion 4/5   Right Knee Extension 4/5   Left Knee Flexion 4/5   Left Knee Extension 4/5   Right Ankle Dorsiflexion  4-/5   Right Ankle Plantar Flexion 4/5   Left Ankle Dorsiflexion 4-/5   Left Ankle Plantar Flexion 4/5     Flexibility   Hamstrings right 64,  left 68     Ambulation/Gait   Gait velocity 4.23 ft/sec                     [facility] Adult PT Treatment/Exercise - 06/24/16 1004      Self-Care   Self-Care Other Self-Care Comments   Other Self-Care Comments  review of HEP and ADL and posture/etc     Knee/Hip Exercises: Supine   Bridges Strengthening;2 sets;10 reps   Bridges Limitations VC for AB engagement   Bridges with [employer] 1 set;15 reps   Other Supine Knee/Hip Exercises pre-pilates exercises pelvic tilt, , bent knee raise , supine clam and heel slide x 10 each right and left  VC for breathing and technique but less than previous visit     Knee/Hip Exercises: Sidelying   Clams 2 x 10 with green t band on right and left in sidelying with abdominal engagement       Manual Therapy   Manual Therapy Soft tissue mobilization   Soft tissue mobilization IASTYM bil gastroc and plantar fascia at insertion     Ankle Exercises: Standing   Heel Raises 1 second   Heel Raises Limitations bil 20, sLS heel raise 10x -x 2 left and right needs rest between set of 10                  PT Short Term Goals - 06/17/16 0944      PT SHORT TERM GOAL #1   Title pt will be independent with intial HEP   Time 4   Period Weeks   Status Achieved     PT SHORT TERM GOAL #2   Title Pt will be able to walk for 15 minutes without exacerbating pain in bil ankle/feet   Baseline Able to walk around the house for 15 minutes   Time 4   Period Weeks   Status Achieved     PT SHORT TERM GOAL #3   Title Pt will be able to walk without a wide base stance due to pain in bil feet   Baseline Pt able to walk with normalized step through gait   Time 4   Period Weeks  Status Achieved           PT Long Term Goals - 06/24/16 1006      PT LONG TERM GOAL #1   Title "Pt will be  independent with advanced HEP   Baseline reviewed final EX program today   Time 8   Period Weeks   Status Achieved     PT LONG TERM GOAL #2   Title "Pt will improve bil LE muscle strength to >/= 4+/5 with </= 2/10 pain to promote safety with walking/standing activities   Baseline See Flow sheet.  4-/5 in hip core pain 0/1/10  just tight   Time 8   Period Weeks   Status Partially Met     PT LONG TERM GOAL #3   Title "FOTO will improve from  65%limitation  to45% limitation    indicating improved functional mobility .    Baseline Foto limitation 40%   Time 8   Period Weeks   Status Achieved     PT LONG TERM GOAL #4   Title "Pt will be able to negotiate steps without exacerbation of pain greater than 2/10    Baseline 2/10 today or less  sometimes more depending on fibromyalgia   Time 8   Period Weeks   Status Achieved     PT LONG TERM GOAL #5   Title "Pt will tolerate standing and walking for 30 minutes without increased pain in order to care for 15 yo mother   Baseline 20-25 minutes   Time 8   Period Weeks   Status Partially Met     PT LONG TERM GOAL #6   Title Pt will be able to increase gait velocity to at least 2.62 ft/sec in order to ambulate at community level and begin home walking program for exercises   Baseline 4.23 ft/sec   Status Achieved     PT LONG TERM GOAL #7   Title Pt will be educated  on community resources for fibromyalgia and be aware of community wellness aquatics program   Time 8   Period Weeks   Status Achieved               Plan - 06/24/16 1229    Clinical Impression Statement Pt enters clinic with 3/10 head pain but ankle pain is 1/10 much improved from evaluation.  Pt has good knowledge of community resources for fibromyalgia and reviewed HEP today.  She has met or partially met all goals  for PT and is pleased with current functional status.  Pt 40% limited on FOTO, goal was 45%.  she has increased in strength and AROM in bil ankles and  is able to improve gait velocity to 4.23 ft/sec near normal walking speed.. ( 4.14f/sec).   Pt also plans to enroll in fibormyalgia support group /aquatics exercises at [employer]  Pt will be DC secondarily to all goals met.   Rehab Potential Good   PT Frequency 2x / week   PT Duration 8 weeks   PT Treatment/Interventions ADLs/Self Care Home Management;Cryotherapy;Electrical Stimulation;Iontophoresis 4102mml Dexamethasone;Stair training;Gait training;Ultrasound;Moist Heat;Therapeutic exercise;Neuromuscular re-education;Patient/family education;Passive range of motion;Manual techniques;Dry needling;Taping   PT Next Visit Plan DC   Consulted and Agree with Plan of Care Patient      Patient will benefit from skilled therapeutic intervention in order to improve the following deficits and impairments:  Difficulty walking, Decreased mobility, Decreased range of motion, Decreased strength, Impaired flexibility, Increased fascial restricitons, Pain, Postural dysfunction, Improper body mechanics  Visit Diagnosis:  Foot pain, bilateral  Difficulty in walking, not elsewhere classified  Fibromyalgia  Muscle weakness (generalized)       G-Codes - Jul 21, 2016 1226-07-27    Functional Assessment Tool Used (Outpatient Only) FOTO   Functional Limitation Mobility: Walking and moving around   Mobility: Walking and Moving Around Goal Status [phone]) At least 40 percent but less than 60 percent impaired, limited or restricted  45%   Mobility: Walking and Moving Around Discharge Status [phone]) At least 20 percent but less than 40 percent impaired, limited or restricted  40%      Problem List Patient Active Problem List   Diagnosis Date Noted  . Asthma with acute exacerbation 06/15/2016  . Oral thrush 06/15/2016  . Fibromyalgia 04/19/2016  . Other fatigue 04/19/2016  . 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 Primary osteoarthritis of both knees 04/19/2016  . Leg pain 01/22/2016  . Restless legs syndrome  01/22/2016  . Snoring 11/19/2015  . Periodic limb movement sleep disorder 11/19/2015  . GERD (gastroesophageal reflux disease) 10/08/2015  . Mild persistent asthma 04/09/2015  . LPRD (laryngopharyngeal reflux disease) 04/09/2015  . Allergic rhinoconjunctivitis 04/09/2015  . HEMATOCHEZIA 04/01/2010  . COUGH 04/01/2010  . GASTRITIS 09/30/2009  . NAUSEA 09/30/2009  . ABDOMINAL PAIN-EPIGASTRIC 09/30/2009  . DYSTHYMIC DISORDER 09/26/2009  . PALPITATIONS 06/17/2009  . SHOULDER IMPINGEMENT SYNDROME, LEFT 06/04/2009  . CONSTIPATION 02/05/2009  . PERSONAL HX COLONIC POLYPS 02/05/2009  . ASTHMA, PERSISTENT, MILD 02/04/2009  . CHEST PAIN, ATYPICAL 02/04/2009  . COLONIC POLYPS, BENIGN 12/18/2008  . HYPERLIPIDEMIA 12/18/2008  . Insomnia 12/18/2008  . MIGRAINE, COMMON 12/18/2008  . HYPERTENSION 12/18/2008  . ALLERGIC RHINITIS 12/18/2008  . GERD 12/18/2008  . Primary osteoarthritis of both hands 12/18/2008  . DJD (degenerative joint disease), cervical 12/18/2008  . NECK PAIN, CHRONIC 12/18/2008  . LOW BACK PAIN, CHRONIC 12/18/2008    Voncille Lo, PT Certified Exercise Expert for the Aging Adult  2016/07/21 12:34 PM Phone: [phone] Fax: [facility] [address] [place], [place], [address] Phone: [phone]   Fax:  [phone]  Name: Christina Deleon MRN: [id number] Date of Birth: 09-05-1950   PHYSICAL THERAPY DISCHARGE SUMMARY  Visits from Start of Care: 6  Current functional level related to goals / functional outcomes: As above   Remaining deficits: See flowsheet   Education / Equipment: HEP and community wellness education Plan: Patient agrees to discharge.  Patient goals were partially met. Patient is being discharged due to meeting the stated rehab goals.  ????? mostly meeting goals  And is pleased with current functional status    Voncille Lo, PT Certified Exercise Expert for the Aging Adult   21-Jul-2016 12:36 PM Phone: [phone] Fax: [phone]

## 2016-06-24 NOTE — Patient Instructions (Signed)
Dont forget stretch your calf muscle and doing your heel raises previously given.  20 with both feet and then 20 with left and right leg only.  That will keep your ankles strong Ms. Lorton  PELVIC TILT  Lie on back, legs bent. Exhale, tilting top of pelvis back, pubic bone up, to flatten lower back. Inhale, rolling pelvis opposite way, top forward, pubic bone down, arch in back. Repeat __10__ times. Do __2__ sessions per day. Copyright  VHI. All rights reserved.    Isometric Hold With Pelvic Floor (Hook-Lying)  Lie with hips and knees bent. Slowly inhale, and then exhale. Pull navel toward spine and tighten pelvic floor. Hold for __10_ seconds. Continue to breathe in and out during hold. Rest for _10__ seconds. Repeat __10_ times. Do __2-3_ times a day.   Knee Fold  Lie on back, legs bent, arms by sides. Exhale, lifting knee to chest. Inhale, returning. Keep abdominals flat, navel to spine. Repeat __10__ times, alternating legs. Do __2__ sessions per day.    Copyright  VHI. All rights reserved.       Heel Slide to Straight   Slide one leg down to straight. Return. Be sure pelvis does not rock forward, tilt, rotate, or tip to side. Do _10__ times. Restabilize pelvis. Repeat with other leg. Do __1-2_ sets, __2_ times per day.  [email or web address].[email or web address]       HIP: Abduction / External Rotation (Band)   Place band around knees. Lie on side with hips and knees bent. Raise top knee up, squeezing glutes. Keep feet together. Hold _5__ seconds. Use __red then green______ band. __10_ reps per set, _2__ sets per day, __1_ days per week try to work toward 20 x for 1 time  [employer] back, legs bent. Inhale, pressing hips up. Keeping ribs in, lengthen lower back. Exhale, rolling down along spine from top. Repeat __10 x2__ times. Do _1___ sessions per day. Ms. Perini do it slowly. Try to work towards 20 x at one time     Knee High   Holding stable object, raise knee to hip level,  then lower knee. Repeat with other knee. Complete __10_ repetitions. Do __2__ sessions per day.  ABDUCTION: Standing (Active)   Stand, feet flat. Lift right leg out to side. Use _0__ lbs. Complete __10_ repetitions. Perform __2_ sessions per day.          EXTENSION: Standing (Active)  Stand, both feet flat. Draw right leg behind body as far as possible. Use 0___ lbs. Complete 10 repetitions. Perform __2_ sessions per day.  Copyright  VHI. All rights reserved.    Voncille Lo, PT Exercise Expert for the Aging Adult  06/24/16 10:03 AM Phone: [phone] Fax: [phone]

## 2016-06-29 ENCOUNTER — Ambulatory Visit: Payer: Medicare Other | Admitting: Physical Therapy

## 2016-07-06 ENCOUNTER — Ambulatory Visit (INDEPENDENT_AMBULATORY_CARE_PROVIDER_SITE_OTHER): Payer: Medicare Other | Admitting: Pulmonary Disease

## 2016-07-06 ENCOUNTER — Encounter: Payer: Self-pay | Admitting: Pulmonary Disease

## 2016-07-06 VITALS — BP 126/78 | HR 86 | Ht 64.0 in | Wt 173.0 lb

## 2016-07-06 DIAGNOSIS — J4530 Mild persistent asthma, uncomplicated: Secondary | ICD-10-CM | POA: Diagnosis not present

## 2016-07-06 DIAGNOSIS — J309 Allergic rhinitis, unspecified: Secondary | ICD-10-CM

## 2016-07-06 DIAGNOSIS — J019 Acute sinusitis, unspecified: Secondary | ICD-10-CM | POA: Diagnosis not present

## 2016-07-06 DIAGNOSIS — K219 Gastro-esophageal reflux disease without esophagitis: Secondary | ICD-10-CM | POA: Diagnosis not present

## 2016-07-06 MED ORDER — LEVOFLOXACIN 500 MG PO TABS: 500.0000 mg | ORAL_TABLET | Freq: Every day | ORAL | 0 refills | Status: DC

## 2016-07-06 MED ORDER — BUDESONIDE-FORMOTEROL FUMARATE 80-4.5 MCG/ACT IN AERO: 2.0000 | INHALATION_SPRAY | Freq: Two times a day (BID) | RESPIRATORY_TRACT | 3 refills | Status: DC

## 2016-07-06 NOTE — Progress Notes (Signed)
Subjective:    Patient ID: Christina Deleon, female    DOB: 1951/04/16, 66 y.o.   MRN: [id number]  C.C.:  Follow-up for Mild, Persistent Asthma, GERD, & Chronic Allergic Rhinitis.  HPI  Mild, persistent asthma: Patient switched from Asmanex to Symbicort at last appointment. Continued on Singulair. At last appointment patient seemed to have a mild post viral syndrome. She does feel a little "jittery" with the Symbicort but that is improving. She is breathing better. Rare wheezing. She is coughing intermittently and feels that her post-nasal drainage is contributing.   GERD: Following with GI. Currently prescribed Zantac and Prilosec. No reflux, dyspepsia, or morning brash water taste.   Chronic allergic rhinitis: Currently prescribed Singulair and Flonase. Also taking Zyrtec. She reports she still has significant sinus congestion & pressure with aching. She reports she has had post-nasal drainage. She is having pain in her right ear that has resolved.   Review of Systems  No fever, chills, or sweats. No chest pain, pressure, or tightness. No rashes or bruising.   Allergies  Allergen Reactions  . Trazodone Hcl Other (See Comments)    REACTION: bad headaches  . Penicillins Nausea Only and Rash    Patient has taken Amoxicillin without an issue, per patient. Has patient had a PCN reaction causing immediate rash, facial/tongue/throat swelling, SOB or lightheadedness with hypotension: No Has patient had a PCN reaction causing severe rash involving mucus membranes or skin necrosis: No Has patient had a PCN reaction that required hospitalization No Has patient had a PCN reaction occurring within the last 10 years: No If all of the above answers are "NO", then may proceed with Cephalosporin use.     Current Outpatient Prescriptions on File Prior to Visit  Medication Sig Dispense Refill  . albuterol (PROAIR HFA) 108 (90 BASE) MCG/ACT inhaler Inhale 2 puffs into the lungs every 6 (six) hours as  needed for wheezing or shortness of breath. 1 Inhaler 1  . Ascorbic Acid (VITAMIN C PO) Take 1 tablet by mouth daily.    . budesonide-formoterol (SYMBICORT) 80-4.5 MCG/ACT inhaler Inhale 2 puffs into the lungs 2 (two) times daily. 1 Inhaler 0  . CALCIUM PO Take 1 tablet by mouth daily.    . candesartan (ATACAND) 16 MG tablet Take 8 mg by mouth at bedtime.     . cetirizine (ZYRTEC) 10 MG tablet Take 1 tablet (10 mg total) by mouth daily. One tab daily for allergies 30 tablet 1  . Cyanocobalamin (VITAMIN B-12 PO) Take 1 tablet by mouth daily.    . Estrogens, Conjugated (PREMARIN VA) Place vaginally.    . fluticasone (FLONASE) 50 MCG/ACT nasal spray instill 2 sprays into each nostril once daily 16 g 3  . methocarbamol (ROBAXIN) 500 MG tablet Take 1 tablet (500 mg total) by mouth daily as needed for muscle spasms. 90 tablet 3  . mometasone (ASMANEX 60 METERED DOSES) 220 MCG/INH inhaler Inhale one dose twice daily to prevent cough or wheeze.  Rinse, gargle, and spit after use. 1 Inhaler 5  . montelukast (SINGULAIR) 10 MG tablet Take 1 tablet (10 mg total) by mouth at bedtime. (Patient taking differently: Take 10 mg by mouth daily. ) 90 tablet 1  . Multiple Vitamin (MULTIVITAMIN) tablet Take 1 tablet by mouth daily. Calcium, Vitamin B-12, Vitamin A, Vitamin C    . nabumetone (RELAFEN) 500 MG tablet Take 500 mg by mouth 2 (two) times daily as needed for mild pain.    Marland Kitchen nystatin (MYCOSTATIN) 100000  UNIT/ML suspension Take 5 mLs (500,000 Units total) by mouth 4 (four) times daily. 60 mL 1  . omeprazole (PRILOSEC) 40 MG capsule Take 1 capsule (40 mg total) by mouth 2 (two) times daily. 180 capsule 3  . polyethylene glycol powder (GLYCOLAX/MIRALAX) powder Take 17 g by mouth daily. (Patient taking differently: Take 17 g by mouth daily as needed. ) 850 g 1  . pravastatin (PRAVACHOL) 20 MG tablet Take 20 mg by mouth daily.      Marland Kitchen PROAIR HFA 108 (90 Base) MCG/ACT inhaler inhale 2 puffs by mouth every 6 hours if  needed for wheezing or shortness of breath 8.5 g 0  . ranitidine (ZANTAC) 300 MG tablet Take 1 tablet (300 mg total) by mouth at bedtime. 30 tablet 11  . RESTASIS MULTIDOSE 0.05 % ophthalmic emulsion     . VITAMIN A PO Take by mouth daily.    Marland Kitchen zolmitriptan (ZOMIG ZMT) 2.5 MG disintegrating tablet Take 1 tablet (2.5 mg total) by mouth as needed for migraine. 10 tablet 11  . zolpidem (AMBIEN) 5 MG tablet TAKE 1-2 TABLETS BY MOUTH AT BEDTIME AS NEEDED 60 tablet 4  . [DISCONTINUED] dexlansoprazole (DEXILANT) 60 MG capsule Take 60 mg by mouth daily.       No current facility-administered medications on file prior to visit.     Past Medical History:  Diagnosis Date  . Adenomatous colon polyp 10/1997, and 03/2009  . Allergic rhinitis   . Allergy   . Arthritis    osteo  . Asthma   . Chronic gastritis   . Fibromyalgia   . GERD (gastroesophageal reflux disease)   . Hemorrhoids   . Hyperlipidemia   . Hypertension   . Menorrhagia    partial hysterectomy  . Migraines   . Sleep disturbance   . 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 CATARACT EXTRACTION Bilateral 10/16 & 11/16  . TONSILLECTOMY  age 59  . tooth extract     with bone graft  . Turbinate sinus surgery  2003  . VAGINAL HYSTERECTOMY  1998   secondary to prolapse, ovaries remain    Family History  Problem Relation Age of Onset  . Throat cancer Father   . Coronary artery disease Father   . Heart attack Father 54  . Stroke Father     during procedure  . Coronary artery disease Mother   . Heart failure Mother   . Hypertension Mother   . Hyperlipidemia Mother   . Pulmonary fibrosis Mother   . Lung disease Mother   . Prostate cancer Brother   . Heart disease Brother   . Diabetes Paternal Aunt     x 2  . Diabetes Paternal Grandmother   . Coronary artery disease Paternal Grandmother   . Stroke Maternal Grandmother     or MI   . Prostate cancer Paternal Uncle     Social History   Social History  . Marital status: Married    Spouse name: N/A  . Number of children: 2  . Years of education: N/A   Occupational History  . Retired      Metallurgist unemployed sincesince 6/10   Social History Main Topics  . Smoking status: Passive Smoke Exposure - Never Smoker  . Smokeless tobacco: Never Used     Comment: Father growing up  . Alcohol use No  . Drug use:  No  . Sexual activity: Yes    Partners: Male    Birth control/ protection: Surgical     Comment: hysterectomy   Other Topics Concern  . None   Social History Narrative   She lives with husband.  They have 2 grown children.   She is retired Product/process development scientist.   Highest level of education:  2 years of college      Regular exercise, diet of fruits, veggies, limited fried foods, limited water      [facility]:   Originally from [place]. Has also lived in [place]. Previously worked doing Software engineer. No pets currently. No bird, mold, or hot tub exposure. Does have a musty smell in their home but it is new. Enjoys reading. 1 indoor plant. Has carpet in her home including in the bedroom. Also has draperies.       Objective:   Physical Exam BP 126/78 (BP Location: Left Arm, Cuff Size: Normal)   Pulse 86   Ht 5\' 4"  (1.626 m)   Wt 173 lb (78.5 kg)   LMP 07/02/1994   SpO2 98%   BMI 29.70 kg/m   Gen.: No distress. Comfortable. Alert. Integument: No rash or bruising on exposed skin. HEENT: Mild bilateral maxillary and frontal sinus tenderness to palpation. Moderate amount of bilateral nasal turbinate swelling with mucus. No oral ulcers. Extremities: No cyanosis or clubbing. Pulmonary: Clear with auscultation. No accessory muscle use. Speaking in complete sentences. Cardiovascular: Regular rate. Regular rhythm. Normal S1 & S2. No edema. Abdomen: Soft. Nondistended. Normal bowel  sounds.  PFT 03/15/16: FVC 2.33 L (93%) FEV1 1.18 L (93%) FEV1/FVC 0.78 FEF 25-75 1.54 L (83%) negative bronchodilator response 11/19/15: FVC 2.54 L (101%) FEV1 2.10 L (107%) FEV1/FVC 0.83 FEF 25-75 2.16 L (116%) negative bronchodilator response TLC 4.68 L (92%) RV 100% ERV 115% DLCO uncorrected 71% 09/23/15: FVC 2.20 L (92%) FEV1 1.82 L (96%) FEV1/FVC 0.82 FEF 25-75 2.00 L (89%)  6MWT 11/25/15:  Walked 512 meters / Baseline Sat 99% on RA / Nadir Sat 99% on RA (complained of dizziness, hip pain, & calf pain at end of walk)  POLYSOMNOGRAM (11/17/15): AHI 0.7 events/hour. Lower saturation 92%. Loud snoring noted. Periodic limb movement index to 85.38. Arousal associated with leg movement index was 26.3. Recommended assessment for restless leg syndrome.  IMAGING CXR PA/LAT 09/05/15 (previously reviewed by me): No parenchymal nodule or opacity appreciated. Heart normal in size & mediastinum normal in contour. No pleural effusion.  LABS 10/08/15 IgE: 149 RAST Panel: Negative    Assessment & Plan:  66 y.o. female with mild, persistent asthma, GERD, and chronic allergic rhinitis. Patient does seem to have symptoms of acute sinusitis and given the duration I feel the treatment with a course of antibiotics is quite reasonable. I am concerned that this is precipitating her cough as well as her asthma seems to be better controlled today symptomatically. Her reflux is controlled and non-contributing. I do question whether or not there may be a significant allergic component to her sinus symptoms given the recent fluctuations and environmental pollen counts. I instructed the patient to contact my office if she had any new breathing problems or questions before next appointment.  1. Acute non-recurrent sinusitis: Treat with Levaquin 500 milligrams by mouth daily 7 days. Recommended sinus decongestant as well. Holding off on sinus CT imaging unless patient's symptoms do not significantly improve or  resolve. 2. Mild, persistent asthma: Continuing patient on Singulair & Symbicort 80/4.5. Prescription for  Symbicort since the pharmacy today. 3. GERD: Controlled with Prilosec & Zantac. No changes. 4. Chronic allergic rhinitis: Continuing patient on Flonase, Singulair, and Zyrtec. Recommended initiating nasal saline rinse twice daily after antibiotic treatment is complete. 5. Health maintenance: Status post influenza vaccine September 2017, Prevnar July 2017, Pneumovax July 2015, &  Tdap July 2011. 6. Follow-up: Return to clinic in 3 months or sooner if needed.  Sonia Baller Ashok Cordia, M.D. [facility] Pager:  [phone] After 3pm or if no response, call [phone] 10:30 AM 07/06/16

## 2016-07-06 NOTE — Patient Instructions (Signed)
   Call me if your sinus congestion doesn't improve or you have any new breathing problems before your next appointment.  We are going to keep you on Symbicort for the time being. We are sending in a prescription.  Once you finish your antibiotic start using a nasal saline rinse twice daily. Try using something like NeilMed - because of the rinse bottle.  You can also try using a sinus medication with a decongestant.  Call me if you have any problems with your antibiotic.  I will see you back in 3 months or sooner if needed.

## 2016-07-15 ENCOUNTER — Other Ambulatory Visit: Payer: Self-pay | Admitting: Gastroenterology

## 2016-07-16 ENCOUNTER — Telehealth: Payer: Self-pay | Admitting: Gastroenterology

## 2016-07-16 MED ORDER — RANITIDINE HCL 300 MG PO TABS: 300.0000 mg | ORAL_TABLET | Freq: Every day | ORAL | 1 refills | Status: DC

## 2016-07-16 NOTE — Telephone Encounter (Signed)
Prescription sent to patient's pharmacy for a 90 day supply. 

## 2016-07-20 ENCOUNTER — Other Ambulatory Visit: Payer: Self-pay | Admitting: Internal Medicine

## 2016-07-20 DIAGNOSIS — E041 Nontoxic single thyroid nodule: Secondary | ICD-10-CM | POA: Diagnosis not present

## 2016-07-20 DIAGNOSIS — J4530 Mild persistent asthma, uncomplicated: Secondary | ICD-10-CM | POA: Diagnosis not present

## 2016-07-20 DIAGNOSIS — I10 Essential (primary) hypertension: Secondary | ICD-10-CM | POA: Diagnosis not present

## 2016-07-20 DIAGNOSIS — G43109 Migraine with aura, not intractable, without status migrainosus: Secondary | ICD-10-CM | POA: Diagnosis not present

## 2016-07-29 ENCOUNTER — Ambulatory Visit
Admission: RE | Admit: 2016-07-29 | Discharge: 2016-07-29 | Disposition: A | Discharge status: Home / Self Care | Payer: Medicare Other | Source: Ambulatory Visit | Attending: Internal Medicine | Admitting: Internal Medicine

## 2016-07-29 DIAGNOSIS — E042 Nontoxic multinodular goiter: Secondary | ICD-10-CM | POA: Diagnosis not present

## 2016-07-29 DIAGNOSIS — E041 Nontoxic single thyroid nodule: Secondary | ICD-10-CM

## 2016-08-03 DIAGNOSIS — J4541 Moderate persistent asthma with (acute) exacerbation: Secondary | ICD-10-CM | POA: Diagnosis not present

## 2016-08-03 DIAGNOSIS — E042 Nontoxic multinodular goiter: Secondary | ICD-10-CM | POA: Diagnosis not present

## 2016-10-05 DIAGNOSIS — H04123 Dry eye syndrome of bilateral lacrimal glands: Secondary | ICD-10-CM | POA: Diagnosis not present

## 2016-10-05 DIAGNOSIS — Z961 Presence of intraocular lens: Secondary | ICD-10-CM | POA: Diagnosis not present

## 2016-10-05 DIAGNOSIS — M3501 Sicca syndrome with keratoconjunctivitis: Secondary | ICD-10-CM | POA: Diagnosis not present

## 2016-10-05 DIAGNOSIS — H40013 Open angle with borderline findings, low risk, bilateral: Secondary | ICD-10-CM | POA: Diagnosis not present

## 2016-10-07 ENCOUNTER — Ambulatory Visit: Payer: Medicare Other | Admitting: Pulmonary Disease

## 2016-10-14 NOTE — Progress Notes (Signed)
Office Visit Note  Patient: Christina Deleon             Date of Birth: 02/15/1951           MRN: [id number]             PCP: Lanice Shirts, MD Referring: Lanice Shirts, * Visit Date: 10/18/2016 Occupation: @GUAROCC @    Subjective:  Neck, lower back pain.   History of Present Illness: Christina Deleon is a 66 y.o. female with history of fibromyalgia syndrome, disc disease and osteoarthritis. She states she continues to have discomfort in her neck and lower back. She also have pain and discomfort in her shoulders, her bilateral hands, her knees and her feet. She continues to have pain in her hands especially in her DIP joints.  Activities of Daily Living:  Patient reports morning stiffness for 10 minutes.   Patient Reports nocturnl pain.  Difficulty dressing/grooming: Denies Difficulty climbing stairs: Reports Difficulty getting out of chair: Reports Difficulty using hands for taps, buttons, cutlery, and/or writing: Denies   Review of Systems  Constitutional: Positive for fatigue. Negative for night sweats, weight gain, weight loss and weakness.  HENT: Positive for mouth dryness. Negative for mouth sores, trouble swallowing, trouble swallowing and nose dryness.   Eyes: Positive for dryness. Negative for pain, redness and visual disturbance.  Respiratory: Negative for cough, shortness of breath and difficulty breathing.   Cardiovascular: Negative for chest pain, palpitations, hypertension, irregular heartbeat and swelling in legs/feet.  Gastrointestinal: Positive for constipation. Negative for blood in stool and diarrhea.  Endocrine: Negative for increased urination.  Genitourinary: Negative for vaginal dryness.  Musculoskeletal: Positive for arthralgias, joint pain, myalgias, morning stiffness and myalgias. Negative for joint swelling, muscle weakness and muscle tenderness.  Skin: Negative for color change, rash, hair loss, skin tightness, ulcers and sensitivity to  sunlight.  Allergic/Immunologic: Negative for susceptible to infections.  Neurological: Negative for dizziness, memory loss and night sweats.  Hematological: Negative for swollen glands.  Psychiatric/Behavioral: Positive for sleep disturbance. Negative for depressed mood. The patient is not nervous/anxious.     PMFS History:  Patient Active Problem List   Diagnosis Date Noted  . Acute sinusitis 07/06/2016  . Fibromyalgia 04/19/2016  . Other fatigue 04/19/2016  . 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 Primary osteoarthritis of both knees 04/19/2016  . Leg pain 01/22/2016  . Restless legs syndrome 01/22/2016  . Snoring 11/19/2015  . Periodic limb movement sleep disorder 11/19/2015  . GERD (gastroesophageal reflux disease) 10/08/2015  . Mild persistent asthma 04/09/2015  . LPRD (laryngopharyngeal reflux disease) 04/09/2015  . Allergic rhinoconjunctivitis 04/09/2015  . HEMATOCHEZIA 04/01/2010  . COUGH 04/01/2010  . GASTRITIS 09/30/2009  . NAUSEA 09/30/2009  . ABDOMINAL PAIN-EPIGASTRIC 09/30/2009  . DYSTHYMIC DISORDER 09/26/2009  . PALPITATIONS 06/17/2009  . Impingement syndrome of shoulder, left 06/04/2009  . CONSTIPATION 02/05/2009  . PERSONAL HX COLONIC POLYPS 02/05/2009  . ASTHMA, PERSISTENT, MILD 02/04/2009  . CHEST PAIN, ATYPICAL 02/04/2009  . COLONIC POLYPS, BENIGN 12/18/2008  . HYPERLIPIDEMIA 12/18/2008  . Insomnia 12/18/2008  . MIGRAINE, COMMON 12/18/2008  . HYPERTENSION 12/18/2008  . ALLERGIC RHINITIS 12/18/2008  . Primary osteoarthritis of both hands 12/18/2008  . DJD (degenerative joint disease), cervical 12/18/2008  . NECK PAIN, CHRONIC 12/18/2008  . LOW BACK PAIN, CHRONIC 12/18/2008    Past Medical History:  Diagnosis Date  . Adenomatous colon polyp 10/1997, and 03/2009  . Allergic rhinitis   . Allergy   . Arthritis  osteo  . Asthma   . Chronic gastritis   . Fibromyalgia   . GERD (gastroesophageal reflux disease)   . Hemorrhoids   . Hyperlipidemia   .  Hypertension   . Menorrhagia    partial hysterectomy  . Migraines   . Sleep disturbance   . STD (sexually transmitted disease) 01/22/11 culture proven   HSV type I labia    Family History  Problem Relation Age of Onset  . Throat cancer Father   . Coronary artery disease Father   . Heart attack Father 46  . Stroke Father        during procedure  . Coronary artery disease Mother   . Heart failure Mother   . Hypertension Mother   . Hyperlipidemia Mother   . Pulmonary fibrosis Mother   . Lung disease Mother   . Prostate cancer Brother   . Heart disease Brother   . Diabetes Paternal Aunt        x 2  . Diabetes Paternal Grandmother   . Coronary artery disease Paternal Grandmother   . Stroke Maternal Grandmother        or MI  . Prostate cancer Paternal Uncle    Past Surgical History:  Procedure Laterality Date  . CATARACT EXTRACTION Bilateral 10/16 & 11/16  . TONSILLECTOMY  age 27  . tooth extract     with bone graft  . Turbinate sinus surgery  2003  . VAGINAL HYSTERECTOMY  1998   secondary to prolapse, ovaries remain   Social History   Social History Narrative   She lives with husband.  They have 2 grown children.   She is retired Product/process development scientist.   Highest level of education:  2 years of college      Regular exercise, diet of fruits, veggies, limited fried foods, limited water      [facility]:   Originally from [place]. Has also lived in [place]. Previously worked doing Software engineer. No pets currently. No bird, mold, or hot tub exposure. Does have a musty smell in their home but it is new. Enjoys reading. 1 indoor plant. Has carpet in her home including in the bedroom. Also has draperies.      Objective: Vital Signs: BP 124/70   Pulse 74   Resp 12   Ht 5\' 4"  (1.626 m)   Wt 170 lb (77.1 kg)   LMP 07/02/1994   BMI 29.18 kg/m    Physical Exam  Constitutional: She is oriented to person, place, and time. She appears  well-developed and well-nourished.  HENT:  Head: Normocephalic and atraumatic.  Eyes: Conjunctivae and EOM are normal.  Neck: Normal range of motion.  Cardiovascular: Normal rate, regular rhythm, normal heart sounds and intact distal pulses.   Pulmonary/Chest: Effort normal and breath sounds normal.  Abdominal: Soft. Bowel sounds are normal.  Lymphadenopathy:    She has no cervical adenopathy.  Neurological: She is alert and oriented to person, place, and time.  Skin: Skin is warm and dry. Capillary refill takes less than 2 seconds.  Psychiatric: She has a normal mood and affect. Her behavior is normal.  Nursing note and vitals reviewed.    Musculoskeletal Exam: C-spine and thoracic spine limited range of motion with some discomfort. Shoulder joints although joints wrist joints with good range of motion. She has. DIP PIP thickening with the right fifth mucin cyst. Hip joints knee joints ankles MTPs PIPs with good range of motion. She has tenderness over right plantar  fascial consistent with plantar fasciitis.  CDAI Exam: No CDAI exam completed.    Investigation: No additional findings.   Imaging: No results found.  Speciality Comments: No specialty comments available.    Procedures:  No procedures performed Allergies: Trazodone hcl and Penicillins   Assessment / Plan:     Visit Diagnoses:   Fibromyalgia: She continues to have some generalized pain and discomfort from fibromyalgia, she has positive tender points.  Other fatigue: Chronic  DJD (degenerative joint disease), cervical: She continues to have discomfort.  DDD lumbar spine: Chronic pain. She's taking nabumetone on long-term basis. I've advised her to forward her labs from her PCP to [place].  Impingement syndrome of shoulder, left: Chronic pain  Primary osteoarthritis of both hands: She has right fifth finger DIP mucin cyst. We will refer her to Dr. Grandville Silos.  Right plantar fasciitis: Will refer to physical  therapy. Detailed counseling was provided. A handout on plantar fasciitis exercises were given.  PERSONAL HX COLONIC POLYPS  History of gastroesophageal reflux (GERD)  History of hypertension  History of asthma  History of hyperlipidemia    Orders: No orders of the defined types were placed in this encounter.  No orders of the defined types were placed in this encounter.   Face-to-face time spent with patient was 30 minutes. 50% of time was spent in counseling and coordination of care.  Follow-Up Instructions: Return for Osteoarthritis DDD FMS.   Bo Merino, MD  Note - This record has been created using Editor, commissioning.  Chart creation errors have been sought, but may not always  have been located. Such creation errors do not reflect on  the standard of medical care.

## 2016-10-18 ENCOUNTER — Ambulatory Visit (INDEPENDENT_AMBULATORY_CARE_PROVIDER_SITE_OTHER): Payer: Medicare Other | Admitting: Rheumatology

## 2016-10-18 ENCOUNTER — Encounter: Payer: Self-pay | Admitting: Rheumatology

## 2016-10-18 VITALS — BP 124/70 | HR 74 | Resp 12 | Ht 64.0 in | Wt 170.0 lb

## 2016-10-18 DIAGNOSIS — M5030 Other cervical disc degeneration, unspecified cervical region: Secondary | ICD-10-CM

## 2016-10-18 DIAGNOSIS — Z8709 Personal history of other diseases of the respiratory system: Secondary | ICD-10-CM | POA: Diagnosis not present

## 2016-10-18 DIAGNOSIS — M19041 Primary osteoarthritis, right hand: Secondary | ICD-10-CM | POA: Diagnosis not present

## 2016-10-18 DIAGNOSIS — Z8719 Personal history of other diseases of the digestive system: Secondary | ICD-10-CM | POA: Diagnosis not present

## 2016-10-18 DIAGNOSIS — M47816 Spondylosis without myelopathy or radiculopathy, lumbar region: Secondary | ICD-10-CM | POA: Diagnosis not present

## 2016-10-18 DIAGNOSIS — Z8669 Personal history of other diseases of the nervous system and sense organs: Secondary | ICD-10-CM

## 2016-10-18 DIAGNOSIS — M19042 Primary osteoarthritis, left hand: Secondary | ICD-10-CM

## 2016-10-18 DIAGNOSIS — M722 Plantar fascial fibromatosis: Secondary | ICD-10-CM

## 2016-10-18 DIAGNOSIS — Z86010 Personal history of colonic polyps: Secondary | ICD-10-CM | POA: Diagnosis not present

## 2016-10-18 DIAGNOSIS — Z8679 Personal history of other diseases of the circulatory system: Secondary | ICD-10-CM | POA: Diagnosis not present

## 2016-10-18 DIAGNOSIS — M797 Fibromyalgia: Secondary | ICD-10-CM

## 2016-10-18 DIAGNOSIS — Z8639 Personal history of other endocrine, nutritional and metabolic disease: Secondary | ICD-10-CM

## 2016-10-18 DIAGNOSIS — M79644 Pain in right finger(s): Secondary | ICD-10-CM

## 2016-10-18 DIAGNOSIS — M47812 Spondylosis without myelopathy or radiculopathy, cervical region: Secondary | ICD-10-CM

## 2016-10-18 DIAGNOSIS — R5383 Other fatigue: Secondary | ICD-10-CM

## 2016-10-18 DIAGNOSIS — M7542 Impingement syndrome of left shoulder: Secondary | ICD-10-CM

## 2016-10-18 NOTE — Patient Instructions (Signed)

## 2016-10-19 NOTE — Progress Notes (Signed)
Subjective:    Patient ID: Christina Deleon, female    DOB: 06/14/1950, 66 y.o.   MRN: [id number]  C.C.:  Follow-up for Mild, Persistent Asthma, GERD, & Chronic Allergic Rhinitis.  HPI  Mild, persistent asthma: Previously switched from Asmanex to Symbicort 80/4.5. Also prescribed Singulair. Previously did have a "jittery" feeling with Symbicort that was slowly improving. She reports she is using her rescue inhaler 1-2 times weekly depending on activity and heat/humidity. No nocturnal awakenings with any wheezing, coughing, or dyspnea. No exacerbations since last appointment.   GERD: Follows with GI. Previously prescribed Prilosec and Zantac. She does wake up with a morning brash water taste nearly on a daily basis. She reports she is compliant with her medications. She feels this taste is more due to her post-nasal drainage. She denies any dysphagia or odynophagia.   Chronic allergic rhinitis: Prescribed Singulair and Flonase. Patient also taking Zyrtec over-the-counter. Recommended starting nasal saline rinse after previous episode of acute sinusitis. She reports she does have intermittent sneezing. She repots a "thick" drainage down the back of her throat. She reports she does have a "trigger" in her throat that sometimes precipitates her sneezing. She reports she forgot to start a saline rinse.   Review of Systems  No chest pain, pressure, or tightness. No fever, chills, or sweats. No syncope or near syncope.   Allergies  Allergen Reactions  . Trazodone Hcl Other (See Comments)    REACTION: bad headaches  . Penicillins Nausea Only and Rash    Patient has taken Amoxicillin without an issue, per patient. Has patient had a PCN reaction causing immediate rash, facial/tongue/throat swelling, SOB or lightheadedness with hypotension: No Has patient had a PCN reaction causing severe rash involving mucus membranes or skin necrosis: No Has patient had a PCN reaction that required hospitalization  No Has patient had a PCN reaction occurring within the last 10 years: No If all of the above answers are "NO", then may proceed with Cephalosporin use.     Current Outpatient Prescriptions on File Prior to Visit  Medication Sig Dispense Refill  . albuterol (PROAIR HFA) 108 (90 BASE) MCG/ACT inhaler Inhale 2 puffs into the lungs every 6 (six) hours as needed for wheezing or shortness of breath. 1 Inhaler 1  . Ascorbic Acid (VITAMIN C PO) Take 1 tablet by mouth daily.    . budesonide-formoterol (SYMBICORT) 80-4.5 MCG/ACT inhaler Inhale 2 puffs into the lungs 2 (two) times daily. 1 Inhaler 0  . CALCIUM PO Take 1 tablet by mouth daily.    . candesartan (ATACAND) 16 MG tablet Take 8 mg by mouth at bedtime.     . cetirizine (ZYRTEC) 10 MG tablet Take 1 tablet (10 mg total) by mouth daily. One tab daily for allergies 30 tablet 1  . Cyanocobalamin (VITAMIN B-12 PO) Take 1 tablet by mouth daily.    . Estrogens, Conjugated (PREMARIN VA) Place vaginally.    . fluticasone (FLONASE) 50 MCG/ACT nasal spray instill 2 sprays into each nostril once daily 16 g 3  . methocarbamol (ROBAXIN) 500 MG tablet Take 1 tablet (500 mg total) by mouth daily as needed for muscle spasms. 90 tablet 3  . mometasone (ASMANEX 60 METERED DOSES) 220 MCG/INH inhaler Inhale one dose twice daily to prevent cough or wheeze.  Rinse, gargle, and spit after use. 1 Inhaler 5  . montelukast (SINGULAIR) 10 MG tablet Take 1 tablet (10 mg total) by mouth at bedtime. (Patient taking differently: Take 10 mg by mouth  daily. ) 90 tablet 1  . Multiple Vitamin (MULTIVITAMIN) tablet Take 1 tablet by mouth daily. Calcium, Vitamin B-12, Vitamin A, Vitamin C    . nabumetone (RELAFEN) 500 MG tablet Take 500 mg by mouth 2 (two) times daily as needed for mild pain.    Marland Kitchen omeprazole (PRILOSEC) 40 MG capsule Take 1 capsule (40 mg total) by mouth 2 (two) times daily. 180 capsule 3  . polyethylene glycol powder (GLYCOLAX/MIRALAX) powder Take 17 g by mouth  daily. (Patient taking differently: Take 17 g by mouth daily as needed. ) 850 g 1  . pravastatin (PRAVACHOL) 20 MG tablet Take 20 mg by mouth daily.      Marland Kitchen PROAIR HFA 108 (90 Base) MCG/ACT inhaler inhale 2 puffs by mouth every 6 hours if needed for wheezing or shortness of breath 8.5 g 0  . ranitidine (ZANTAC) 300 MG tablet Take 1 tablet (300 mg total) by mouth at bedtime. 90 tablet 1  . RESTASIS MULTIDOSE 0.05 % ophthalmic emulsion     . VITAMIN A PO Take by mouth daily.    Marland Kitchen zolmitriptan (ZOMIG ZMT) 2.5 MG disintegrating tablet Take 1 tablet (2.5 mg total) by mouth as needed for migraine. 10 tablet 11  . zolpidem (AMBIEN) 5 MG tablet TAKE 1-2 TABLETS BY MOUTH AT BEDTIME AS NEEDED 60 tablet 4  . [DISCONTINUED] dexlansoprazole (DEXILANT) 60 MG capsule Take 60 mg by mouth daily.       No current facility-administered medications on file prior to visit.     Past Medical History:  Diagnosis Date  . Adenomatous colon polyp 10/1997, and 03/2009  . Allergic rhinitis   . Allergy   . Arthritis    osteo  . Asthma   . Chronic gastritis   . Fibromyalgia   . GERD (gastroesophageal reflux disease)   . Hemorrhoids   . Hyperlipidemia   . Hypertension   . Menorrhagia    partial hysterectomy  . Migraines   . Sleep disturbance   . 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 CATARACT EXTRACTION Bilateral 10/16 & 11/16  . TONSILLECTOMY  age 16  . tooth extract     with bone graft  . Turbinate sinus surgery  2003  . VAGINAL HYSTERECTOMY  1998   secondary to prolapse, ovaries remain    Family History  Problem Relation Age of Onset  . Throat cancer Father   . Coronary artery disease Father   . Heart attack Father 3  . Stroke Father        during procedure  . Coronary artery disease Mother   . Heart failure Mother   . Hypertension Mother   . Hyperlipidemia Mother   . Pulmonary fibrosis Mother    . Lung disease Mother   . Prostate cancer Brother   . Heart disease Brother   . Diabetes Paternal Aunt        x 2  . Diabetes Paternal Grandmother   . Coronary artery disease Paternal Grandmother   . Stroke Maternal Grandmother        or MI  . Prostate cancer Paternal Uncle     Social History   Social History  . Marital status: Married    Spouse name: N/A  . Number of children: 2  . Years of education: N/A   Occupational History  . Retired      Metallurgist unemployed  sincesince 6/10   Social History Main Topics  . Smoking status: Passive Smoke Exposure - Never Smoker  . Smokeless tobacco: Never Used     Comment: Father growing up  . Alcohol use No  . Drug use: No  . Sexual activity: Yes    Partners: Male    Birth control/ protection: Surgical     Comment: hysterectomy   Other Topics Concern  . None   Social History Narrative   She lives with husband.  They have 2 grown children.   She is retired Product/process development scientist.   Highest level of education:  2 years of college      Regular exercise, diet of fruits, veggies, limited fried foods, limited water      [facility]:   Originally from [place]. Has also lived in [place]. Previously worked doing Software engineer. No pets currently. No bird, mold, or hot tub exposure. Does have a musty smell in their home but it is new. Enjoys reading. 1 indoor plant. Has carpet in her home including in the bedroom. Also has draperies.       Objective:   Physical Exam BP 114/70 (BP Location: Left Arm, Cuff Size: Normal)   Pulse 90   Ht 5\' 4"  (1.626 m)   Wt 170 lb 6.4 oz (77.3 kg)   LMP 07/02/1994   SpO2 96%   BMI 29.25 kg/m   General:  Awake. Alert. No acute distress. Mild central obesity. Husband with patient today..  Integument:  Warm & dry. No rash on exposed skin.  Extremities:  No cyanosis or clubbing.  HEENT:  Moist mucus membranes. No scleral icterus. Minimal nasal  turbinate swelling bilaterally. Cardiovascular:  Regular rate. No edema. Normal S1 & S2.  Pulmonary:  Clear to auscultation bilaterally. No wheezing. Good aeration bilaterally. Abdomen: Soft. Normal bowel sounds. Mildly protuberant. Musculoskeletal:  Normal bulk and tone. No joint deformity or effusion appreciated.  PFT 03/15/16: FVC 2.33 L (93%) FEV1 1.18 L (93%) FEV1/FVC 0.78 FEF 25-75 1.54 L (83%) negative bronchodilator response 11/19/15: FVC 2.54 L (101%) FEV1 2.10 L (107%) FEV1/FVC 0.83 FEF 25-75 2.16 L (116%) negative bronchodilator response TLC 4.68 L (92%) RV 100% ERV 115% DLCO uncorrected 71% 09/23/15: FVC 2.20 L (92%) FEV1 1.82 L (96%) FEV1/FVC 0.82 FEF 25-75 2.00 L (89%)  6MWT 11/25/15:  Walked 512 meters / Baseline Sat 99% on RA / Nadir Sat 99% on RA (complained of dizziness, hip pain, & calf pain at end of walk)  POLYSOMNOGRAM (11/17/15): AHI 0.7 events/hour. Lower saturation 92%. Loud snoring noted. Periodic limb movement index to 85.38. Arousal associated with leg movement index was 26.3. Recommended assessment for restless leg syndrome.  IMAGING CXR PA/LAT 09/05/15 (previously reviewed by me): No parenchymal nodule or opacity appreciated. Heart normal in size & mediastinum normal in contour. No pleural effusion.  LABS 10/08/15 IgE: 149 RAST Panel: Negative    Assessment & Plan:  66 y.o. female with mild, persistent asthma, GERD, and chronic allergic rhinitis. Patient's asthma seems to be very well-controlled at this time on her current medication regimen. I suspect that her abnormal taste and postnasal drainage are secondary to her chronic allergic rhinitis. She may benefit from alternative intranasal medication spray. Overall her reflux seems to be well-controlled and I do not feel that her morning symptoms are due to silent laryngo-esophageal reflux. I instructed the patient to contact my office if she had any new breathing problems or questions before her next  appointment.  1.  Mild, persistent asthma: Continuing patient on Singulair and Symbicort 80/4.5. Recommended she could try doing 1 inhalation twice daily of the Symbicort if she continued to have good symptom control. 2. GERD: Continuing Prilosec and Zantac. No changes. 3. Chronic allergic rhinitis: Again recommended twice daily nasal saline rinse. Continuing Singulair and Zyrtec. Dymista nasal spray in place of Flonase. She will contact me for a prescription if this is more effective. 4. Health maintenance: Status post influenza vaccine September 2017, Prevnar July 2017, Pneumovax July 2015, &  Tdap July 2011. 5. Follow-up: Return to clinic in 6 months or sooner if needed.  Sonia Baller Ashok Cordia, M.D. [facility] Pager:  [phone] After 3pm or if no response, call [phone] 9:49 AM 10/20/16

## 2016-10-20 ENCOUNTER — Encounter: Payer: Self-pay | Admitting: Pulmonary Disease

## 2016-10-20 ENCOUNTER — Ambulatory Visit (INDEPENDENT_AMBULATORY_CARE_PROVIDER_SITE_OTHER): Payer: Medicare Other | Admitting: Pulmonary Disease

## 2016-10-20 VITALS — BP 114/70 | HR 90 | Ht 64.0 in | Wt 170.4 lb

## 2016-10-20 DIAGNOSIS — K219 Gastro-esophageal reflux disease without esophagitis: Secondary | ICD-10-CM

## 2016-10-20 DIAGNOSIS — J309 Allergic rhinitis, unspecified: Secondary | ICD-10-CM

## 2016-10-20 DIAGNOSIS — J4530 Mild persistent asthma, uncomplicated: Secondary | ICD-10-CM | POA: Diagnosis not present

## 2016-10-20 MED ORDER — AZELASTINE-FLUTICASONE 137-50 MCG/ACT NA SUSP: 1.0000 | Freq: Every day | NASAL | 0 refills | Status: DC

## 2016-10-20 NOTE — Patient Instructions (Addendum)
   Continue taking your Symbicort and other medications as prescribed.  If your sinus congestion/drainage improves and your symptoms are stable you can try doing 1 inhalation of your Symbicort twice daily to see if that will keep your asthma controlled.  Try using the Dymista nasal spray in place of your Flonase - start with 1 spray in each nostril once daily. You can go up to 1 spray in each nostril twice daily if you need more help with your sinus congestion & drainage.  Call me for a prescription for the Dymista if this works better than your Flonase.  Remember to start using the saline sinus rinse twice daily, before your other nasal spray.  Call me if you have any questions or new breathing problems before your next appointment.

## 2016-10-20 NOTE — Addendum Note (Signed)
Addended by: Lorretta Harp on: 10/20/2016 10:51 AM   Modules accepted: Orders

## 2016-10-29 ENCOUNTER — Ambulatory Visit: Payer: Medicare Other | Admitting: Neurology

## 2016-11-04 ENCOUNTER — Other Ambulatory Visit: Payer: Self-pay | Admitting: Rheumatology

## 2016-11-04 DIAGNOSIS — M67441 Ganglion, right hand: Secondary | ICD-10-CM | POA: Diagnosis not present

## 2016-11-04 NOTE — Telephone Encounter (Signed)
Last Visit: 10/18/16 Next Visit: 04/19/17  Okay to refill Ambien?

## 2016-11-05 ENCOUNTER — Other Ambulatory Visit: Payer: Self-pay | Admitting: Rheumatology

## 2016-11-05 ENCOUNTER — Other Ambulatory Visit: Payer: Self-pay | Admitting: Orthopedic Surgery

## 2016-11-05 NOTE — Telephone Encounter (Signed)
Left message to advise patient prescription has been sent to the pharmacy.  

## 2016-11-05 NOTE — Telephone Encounter (Signed)
Patient called this morning stating that the pharmacy has not received her prescription for her Ambien.  Patient is going out of town on Monday and needs the prescription before she leaves.  It shows that the prescription went in yesterday.

## 2016-11-12 ENCOUNTER — Ambulatory Visit: Payer: Medicare Other | Admitting: Neurology

## 2016-11-14 NOTE — H&P (Signed)
Christina Deleon is an 66 y.o. female.   CC / Reason for Visit: Right small finger HPI: This patient is a 66 year old, right-hand-dominant, retired female who is known to [place] who returns to [place] today for a right small finger mucoid cyst that has been present for approximately 6 months.  She indicates is not yet affecting her nail, but if she bumps it, it is very painful.  She indicates that at times it becomes enlarged and becomes difficult to flex the DIP of her digit.  She reports that she had the right long finger mucous cyst excised in 2016.  Prior to that she had it aspirated.  She wishes to move forward with excision and avoid aspiration which is why she is here today.  Past Medical History:  Diagnosis Date  . Adenomatous colon polyp 10/1997, and 03/2009  . Allergic rhinitis   . Allergy   . Arthritis    osteo  . Asthma   . Chronic gastritis   . Fibromyalgia   . GERD (gastroesophageal reflux disease)   . Hemorrhoids   . Hyperlipidemia   . Hypertension   . Menorrhagia    partial hysterectomy  . Migraines   . Sleep disturbance   . 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 CATARACT EXTRACTION Bilateral 10/16 & 11/16  . TONSILLECTOMY  age 4  . tooth extract     with bone graft  . Turbinate sinus surgery  2003  . VAGINAL HYSTERECTOMY  1998   secondary to prolapse, ovaries remain    Family History  Problem Relation Age of Onset  . Throat cancer Father   . Coronary artery disease Father   . Heart attack Father 58  . Stroke Father        during procedure  . Coronary artery disease Mother   . Heart failure Mother   . Hypertension Mother   . Hyperlipidemia Mother   . Pulmonary fibrosis Mother   . Lung disease Mother   . Prostate cancer Brother   . Heart disease Brother   . Diabetes Paternal Aunt        x 2  . Diabetes Paternal Grandmother   . Coronary artery disease Paternal  Grandmother   . Stroke Maternal Grandmother        or MI  . Prostate cancer Paternal Uncle    Social History:  reports that she is a non-smoker but has been exposed to tobacco smoke. She has never used smokeless tobacco. She reports that she does not drink alcohol or use drugs.  Allergies:  Allergies  Allergen Reactions  . Trazodone Hcl Other (See Comments)    REACTION: bad headaches  . Penicillins Nausea Only and Rash    Patient has taken Amoxicillin without an issue, per patient. Has patient had a PCN reaction causing immediate rash, facial/tongue/throat swelling, SOB or lightheadedness with hypotension: No Has patient had a PCN reaction causing severe rash involving mucus membranes or skin necrosis: No Has patient had a PCN reaction that required hospitalization No Has patient had a PCN reaction occurring within the last 10 years: No If all of the above answers are "NO", then may proceed with Cephalosporin use.     No prescriptions prior to admission.    No results found for this or any previous visit (from the past 48 hour(s)). No results found.  Review of Systems  All other  systems reviewed and are negative.   Last menstrual period 07/02/1994. Physical Exam  Constitutional:  WD, WN, NAD HEENT:  NCAT, EOMI Neuro/Psych:  Alert & oriented to person, place, and time; appropriate mood & affect Lymphatic: No generalized UE edema or lymphadenopathy Extremities / MSK:  Both UE are normal with respect to appearance, ranges of motion, joint stability, muscle strength/tone, sensation, & perfusion except as otherwise noted:  The right small finger has 1/2 cm by half centimeter mass on the dorsal radial aspect of her small finger just superior to her DIP that appears to be fluid filled.  There is no pain with palpation.  The patient can make a full fist.  Light touch sensibility is intact on all digits all aspects.  There is a minor ridge noted in the patient's nail.  NVI  Labs /  Xrays:  No radiographic studies obtained today.  Assessment: Probable right small finger mucoid cyst  Plan:  The findings are discussed with the patient as well as the patient's husband.  She wishes to proceed with right small finger mucoid cyst excision. The details of the operative procedure were discussed with the patient.  Questions were invited and answered.  In addition to the goal of the procedure, the risks of the procedure to include but not limited to bleeding; infection; damage to the nerves or blood vessels that could result in bleeding, numbness, weakness, chronic pain, and the need for additional procedures; stiffness; the need for revision surgery; and anesthetic risks were reviewed.  No specific outcome was guaranteed or implied.  This patient will be contacted by our surgery coordinator to discuss appropriate scheduling.  Haidee Stogsdill A., MD 11/14/2016, 7:49 PM

## 2016-11-15 ENCOUNTER — Encounter (HOSPITAL_BASED_OUTPATIENT_CLINIC_OR_DEPARTMENT_OTHER): Payer: Self-pay | Admitting: *Deleted

## 2016-11-15 ENCOUNTER — Encounter (HOSPITAL_BASED_OUTPATIENT_CLINIC_OR_DEPARTMENT_OTHER)
Admission: RE | Admit: 2016-11-15 | Discharge: 2016-11-15 | Disposition: A | Discharge status: Home / Self Care | Payer: Medicare Other | Source: Ambulatory Visit | Attending: Orthopedic Surgery | Admitting: Orthopedic Surgery

## 2016-11-15 DIAGNOSIS — Z880 Allergy status to penicillin: Secondary | ICD-10-CM | POA: Diagnosis not present

## 2016-11-15 DIAGNOSIS — M67441 Ganglion, right hand: Secondary | ICD-10-CM | POA: Diagnosis not present

## 2016-11-15 DIAGNOSIS — G2581 Restless legs syndrome: Secondary | ICD-10-CM | POA: Diagnosis not present

## 2016-11-15 DIAGNOSIS — M545 Low back pain: Secondary | ICD-10-CM | POA: Diagnosis not present

## 2016-11-15 DIAGNOSIS — G43009 Migraine without aura, not intractable, without status migrainosus: Secondary | ICD-10-CM | POA: Diagnosis not present

## 2016-11-15 DIAGNOSIS — K2950 Unspecified chronic gastritis without bleeding: Secondary | ICD-10-CM | POA: Diagnosis not present

## 2016-11-15 DIAGNOSIS — G4700 Insomnia, unspecified: Secondary | ICD-10-CM | POA: Diagnosis not present

## 2016-11-15 DIAGNOSIS — M25741 Osteophyte, right hand: Secondary | ICD-10-CM | POA: Diagnosis not present

## 2016-11-15 DIAGNOSIS — G8929 Other chronic pain: Secondary | ICD-10-CM | POA: Diagnosis not present

## 2016-11-15 DIAGNOSIS — M19041 Primary osteoarthritis, right hand: Secondary | ICD-10-CM | POA: Diagnosis not present

## 2016-11-15 DIAGNOSIS — M170 Bilateral primary osteoarthritis of knee: Secondary | ICD-10-CM | POA: Diagnosis not present

## 2016-11-15 DIAGNOSIS — M47892 Other spondylosis, cervical region: Secondary | ICD-10-CM | POA: Diagnosis not present

## 2016-11-15 DIAGNOSIS — M542 Cervicalgia: Secondary | ICD-10-CM | POA: Diagnosis not present

## 2016-11-15 DIAGNOSIS — Z888 Allergy status to other drugs, medicaments and biological substances status: Secondary | ICD-10-CM | POA: Diagnosis not present

## 2016-11-15 DIAGNOSIS — K5900 Constipation, unspecified: Secondary | ICD-10-CM | POA: Diagnosis not present

## 2016-11-15 DIAGNOSIS — J4530 Mild persistent asthma, uncomplicated: Secondary | ICD-10-CM | POA: Diagnosis not present

## 2016-11-15 DIAGNOSIS — M19042 Primary osteoarthritis, left hand: Secondary | ICD-10-CM | POA: Diagnosis not present

## 2016-11-15 DIAGNOSIS — M797 Fibromyalgia: Secondary | ICD-10-CM | POA: Diagnosis not present

## 2016-11-15 DIAGNOSIS — E785 Hyperlipidemia, unspecified: Secondary | ICD-10-CM | POA: Diagnosis not present

## 2016-11-15 DIAGNOSIS — F341 Dysthymic disorder: Secondary | ICD-10-CM | POA: Diagnosis not present

## 2016-11-15 DIAGNOSIS — I10 Essential (primary) hypertension: Secondary | ICD-10-CM | POA: Diagnosis not present

## 2016-11-15 DIAGNOSIS — K219 Gastro-esophageal reflux disease without esophagitis: Secondary | ICD-10-CM | POA: Diagnosis not present

## 2016-11-15 DIAGNOSIS — Z79899 Other long term (current) drug therapy: Secondary | ICD-10-CM | POA: Diagnosis not present

## 2016-11-15 NOTE — Progress Notes (Signed)
Ekg reviewed by Dr. Sabra Heck - ok for surgery

## 2016-11-16 ENCOUNTER — Ambulatory Visit (HOSPITAL_BASED_OUTPATIENT_CLINIC_OR_DEPARTMENT_OTHER)
Admission: RE | Admit: 2016-11-16 | Discharge: 2016-11-16 | Disposition: A | Discharge status: Home / Self Care | Payer: Medicare Other | Source: Ambulatory Visit | Attending: Orthopedic Surgery | Admitting: Orthopedic Surgery

## 2016-11-16 ENCOUNTER — Encounter (HOSPITAL_BASED_OUTPATIENT_CLINIC_OR_DEPARTMENT_OTHER): Payer: Self-pay | Admitting: Anesthesiology

## 2016-11-16 ENCOUNTER — Encounter (HOSPITAL_BASED_OUTPATIENT_CLINIC_OR_DEPARTMENT_OTHER)
Admission: RE | Disposition: A | Discharge status: Home / Self Care | Payer: Self-pay | Source: Ambulatory Visit | Attending: Orthopedic Surgery

## 2016-11-16 ENCOUNTER — Ambulatory Visit (HOSPITAL_BASED_OUTPATIENT_CLINIC_OR_DEPARTMENT_OTHER): Payer: Medicare Other | Admitting: Anesthesiology

## 2016-11-16 DIAGNOSIS — G4700 Insomnia, unspecified: Secondary | ICD-10-CM | POA: Diagnosis not present

## 2016-11-16 DIAGNOSIS — I10 Essential (primary) hypertension: Secondary | ICD-10-CM | POA: Insufficient documentation

## 2016-11-16 DIAGNOSIS — J4530 Mild persistent asthma, uncomplicated: Secondary | ICD-10-CM | POA: Insufficient documentation

## 2016-11-16 DIAGNOSIS — G8929 Other chronic pain: Secondary | ICD-10-CM | POA: Insufficient documentation

## 2016-11-16 DIAGNOSIS — M67441 Ganglion, right hand: Secondary | ICD-10-CM | POA: Insufficient documentation

## 2016-11-16 DIAGNOSIS — M545 Low back pain: Secondary | ICD-10-CM | POA: Insufficient documentation

## 2016-11-16 DIAGNOSIS — G2581 Restless legs syndrome: Secondary | ICD-10-CM | POA: Insufficient documentation

## 2016-11-16 DIAGNOSIS — M542 Cervicalgia: Secondary | ICD-10-CM | POA: Insufficient documentation

## 2016-11-16 DIAGNOSIS — E785 Hyperlipidemia, unspecified: Secondary | ICD-10-CM | POA: Diagnosis not present

## 2016-11-16 DIAGNOSIS — M19042 Primary osteoarthritis, left hand: Secondary | ICD-10-CM | POA: Diagnosis not present

## 2016-11-16 DIAGNOSIS — G43009 Migraine without aura, not intractable, without status migrainosus: Secondary | ICD-10-CM | POA: Insufficient documentation

## 2016-11-16 DIAGNOSIS — K219 Gastro-esophageal reflux disease without esophagitis: Secondary | ICD-10-CM | POA: Insufficient documentation

## 2016-11-16 DIAGNOSIS — M19041 Primary osteoarthritis, right hand: Secondary | ICD-10-CM | POA: Insufficient documentation

## 2016-11-16 DIAGNOSIS — M25741 Osteophyte, right hand: Secondary | ICD-10-CM | POA: Insufficient documentation

## 2016-11-16 DIAGNOSIS — Z79899 Other long term (current) drug therapy: Secondary | ICD-10-CM | POA: Insufficient documentation

## 2016-11-16 DIAGNOSIS — M797 Fibromyalgia: Secondary | ICD-10-CM | POA: Diagnosis not present

## 2016-11-16 DIAGNOSIS — F341 Dysthymic disorder: Secondary | ICD-10-CM | POA: Diagnosis not present

## 2016-11-16 DIAGNOSIS — K2950 Unspecified chronic gastritis without bleeding: Secondary | ICD-10-CM | POA: Diagnosis not present

## 2016-11-16 DIAGNOSIS — M67431 Ganglion, right wrist: Secondary | ICD-10-CM | POA: Diagnosis not present

## 2016-11-16 DIAGNOSIS — Z888 Allergy status to other drugs, medicaments and biological substances status: Secondary | ICD-10-CM | POA: Insufficient documentation

## 2016-11-16 DIAGNOSIS — Z880 Allergy status to penicillin: Secondary | ICD-10-CM | POA: Insufficient documentation

## 2016-11-16 DIAGNOSIS — M47892 Other spondylosis, cervical region: Secondary | ICD-10-CM | POA: Insufficient documentation

## 2016-11-16 DIAGNOSIS — M170 Bilateral primary osteoarthritis of knee: Secondary | ICD-10-CM | POA: Insufficient documentation

## 2016-11-16 DIAGNOSIS — K5900 Constipation, unspecified: Secondary | ICD-10-CM | POA: Insufficient documentation

## 2016-11-16 HISTORY — PX: EXCISION METACARPAL MASS: SHX6372

## 2016-11-16 HISTORY — DX: Other specified postprocedural states: Z98.890

## 2016-11-16 HISTORY — DX: Nausea with vomiting, unspecified: R11.2

## 2016-11-16 SURGERY — EXCISION METACARPAL MASS
Anesthesia: Monitor Anesthesia Care | Site: Finger | Laterality: Right

## 2016-11-16 MED ORDER — OXYCODONE HCL 5 MG PO TABS: 5.0000 mg | ORAL_TABLET | Freq: Four times a day (QID) | ORAL | 0 refills | Status: DC | PRN

## 2016-11-16 MED ORDER — LIDOCAINE HCL (PF) 1 % IJ SOLN
INTRAMUSCULAR | Status: DC | PRN
  Administered 2016-11-16: 4.5 mL

## 2016-11-16 MED ORDER — ONDANSETRON HCL 4 MG/2ML IJ SOLN
INTRAMUSCULAR | Status: AC
  Filled 2016-11-16: qty 2

## 2016-11-16 MED ORDER — MIDAZOLAM HCL 5 MG/5ML IJ SOLN
INTRAMUSCULAR | Status: DC | PRN
  Administered 2016-11-16: 2 mg via INTRAVENOUS

## 2016-11-16 MED ORDER — MIDAZOLAM HCL 2 MG/2ML IJ SOLN: 1.0000 mg | INTRAMUSCULAR | Status: DC | PRN

## 2016-11-16 MED ORDER — PROPOFOL 500 MG/50ML IV EMUL
INTRAVENOUS | Status: DC | PRN
  Administered 2016-11-16: 25 ug/kg/min via INTRAVENOUS

## 2016-11-16 MED ORDER — LIDOCAINE HCL (PF) 1 % IJ SOLN
INTRAMUSCULAR | Status: AC
  Filled 2016-11-16: qty 30

## 2016-11-16 MED ORDER — FENTANYL CITRATE (PF) 100 MCG/2ML IJ SOLN
INTRAMUSCULAR | Status: AC
  Filled 2016-11-16: qty 2

## 2016-11-16 MED ORDER — ONDANSETRON HCL 4 MG/2ML IJ SOLN: 4.0000 mg | Freq: Once | INTRAMUSCULAR | Status: DC | PRN

## 2016-11-16 MED ORDER — MIDAZOLAM HCL 2 MG/2ML IJ SOLN
INTRAMUSCULAR | Status: AC
  Filled 2016-11-16: qty 2

## 2016-11-16 MED ORDER — MEPERIDINE HCL 25 MG/ML IJ SOLN: 6.2500 mg | INTRAMUSCULAR | Status: DC | PRN

## 2016-11-16 MED ORDER — BUPIVACAINE-EPINEPHRINE 0.5% -1:200000 IJ SOLN
INTRAMUSCULAR | Status: DC | PRN
  Administered 2016-11-16: 4.5 mL

## 2016-11-16 MED ORDER — BUPIVACAINE-EPINEPHRINE (PF) 0.5% -1:200000 IJ SOLN
INTRAMUSCULAR | Status: AC
  Filled 2016-11-16: qty 30

## 2016-11-16 MED ORDER — CLINDAMYCIN PHOSPHATE 900 MG/50ML IV SOLN
INTRAVENOUS | Status: AC
  Filled 2016-11-16: qty 50

## 2016-11-16 MED ORDER — LACTATED RINGERS IV SOLN
INTRAVENOUS | Status: DC
  Administered 2016-11-16: 12:00:00 via INTRAVENOUS

## 2016-11-16 MED ORDER — FENTANYL CITRATE (PF) 100 MCG/2ML IJ SOLN: 25.0000 ug | INTRAMUSCULAR | Status: DC | PRN

## 2016-11-16 MED ORDER — LIDOCAINE HCL (CARDIAC) 20 MG/ML IV SOLN
INTRAVENOUS | Status: AC
  Filled 2016-11-16: qty 5

## 2016-11-16 MED ORDER — FENTANYL CITRATE (PF) 100 MCG/2ML IJ SOLN: 50.0000 ug | INTRAMUSCULAR | Status: DC | PRN

## 2016-11-16 MED ORDER — CLINDAMYCIN PHOSPHATE 900 MG/50ML IV SOLN
900.0000 mg | INTRAVENOUS | Status: AC
  Administered 2016-11-16: 900 mg via INTRAVENOUS

## 2016-11-16 MED ORDER — LACTATED RINGERS IV SOLN: INTRAVENOUS | Status: DC

## 2016-11-16 MED ORDER — ACETAMINOPHEN 325 MG PO TABS: 650.0000 mg | ORAL_TABLET | Freq: Four times a day (QID) | ORAL | Status: DC | PRN

## 2016-11-16 MED ORDER — ONDANSETRON HCL 4 MG/2ML IJ SOLN
INTRAMUSCULAR | Status: DC | PRN
  Administered 2016-11-16: 4 mg via INTRAVENOUS

## 2016-11-16 MED ORDER — PROPOFOL 10 MG/ML IV BOLUS
INTRAVENOUS | Status: AC
  Filled 2016-11-16: qty 20

## 2016-11-16 MED ORDER — SCOPOLAMINE 1 MG/3DAYS TD PT72: 1.0000 | MEDICATED_PATCH | Freq: Once | TRANSDERMAL | Status: DC | PRN

## 2016-11-16 MED ORDER — FENTANYL CITRATE (PF) 100 MCG/2ML IJ SOLN
INTRAMUSCULAR | Status: DC | PRN
  Administered 2016-11-16: 100 ug via INTRAVENOUS

## 2016-11-16 SURGICAL SUPPLY — 49 items
BANDAGE COBAN STERILE 2 (GAUZE/BANDAGES/DRESSINGS) IMPLANT
BLADE MINI RND TIP GREEN BEAV (BLADE) IMPLANT
BLADE SURG 15 STRL LF DISP TIS (BLADE) ×1 IMPLANT
BLADE SURG 15 STRL SS (BLADE) ×2
BNDG CMPR 9X4 STRL LF SNTH (GAUZE/BANDAGES/DRESSINGS) ×1
BNDG COHESIVE 1X5 TAN STRL LF (GAUZE/BANDAGES/DRESSINGS) ×1 IMPLANT
BNDG COHESIVE 4X5 TAN STRL (GAUZE/BANDAGES/DRESSINGS) ×1 IMPLANT
BNDG CONFORM 2 STRL LF (GAUZE/BANDAGES/DRESSINGS) ×1 IMPLANT
BNDG ESMARK 4X9 LF (GAUZE/BANDAGES/DRESSINGS) ×1 IMPLANT
BNDG GAUZE 1X2.1 STRL (MISCELLANEOUS) IMPLANT
BNDG GAUZE ELAST 4 BULKY (GAUZE/BANDAGES/DRESSINGS) ×1 IMPLANT
CHLORAPREP W/TINT 26ML (MISCELLANEOUS) ×2 IMPLANT
CORD BIPOLAR FORCEPS 12FT (ELECTRODE) ×1 IMPLANT
COVER BACK TABLE 60X90IN (DRAPES) ×2 IMPLANT
COVER MAYO STAND STRL (DRAPES) ×2 IMPLANT
CUFF TOURNIQUET SINGLE 18IN (TOURNIQUET CUFF) ×1 IMPLANT
DRAIN PENROSE 1/2X12 LTX STRL (WOUND CARE) IMPLANT
DRAPE EXTREMITY T 121X128X90 (DRAPE) ×2 IMPLANT
DRAPE SURG 17X23 STRL (DRAPES) ×2 IMPLANT
DRSG EMULSION OIL 3X3 NADH (GAUZE/BANDAGES/DRESSINGS) ×2 IMPLANT
GAUZE SPONGE 4X4 12PLY STRL LF (GAUZE/BANDAGES/DRESSINGS) ×2 IMPLANT
GLOVE BIO SURGEON STRL SZ7 (GLOVE) ×1 IMPLANT
GLOVE BIO SURGEON STRL SZ7.5 (GLOVE) ×2 IMPLANT
GLOVE BIOGEL PI IND STRL 7.0 (GLOVE) ×1 IMPLANT
GLOVE BIOGEL PI IND STRL 8 (GLOVE) ×1 IMPLANT
GLOVE BIOGEL PI INDICATOR 7.0 (GLOVE) ×1
GLOVE BIOGEL PI INDICATOR 8 (GLOVE) ×1
GLOVE ECLIPSE 6.5 STRL STRAW (GLOVE) ×2 IMPLANT
GOWN STRL REUS W/ TWL LRG LVL3 (GOWN DISPOSABLE) ×2 IMPLANT
GOWN STRL REUS W/TWL LRG LVL3 (GOWN DISPOSABLE) ×4
GOWN STRL REUS W/TWL XL LVL3 (GOWN DISPOSABLE) ×2 IMPLANT
NDL HYPO 25X1 1.5 SAFETY (NEEDLE) IMPLANT
NDL SAFETY ECLIPSE 18X1.5 (NEEDLE) IMPLANT
NEEDLE HYPO 18GX1.5 SHARP (NEEDLE) ×2
NEEDLE HYPO 25X1 1.5 SAFETY (NEEDLE) ×2 IMPLANT
NS IRRIG 1000ML POUR BTL (IV SOLUTION) ×2 IMPLANT
PACK BASIN DAY SURGERY FS (CUSTOM PROCEDURE TRAY) ×2 IMPLANT
PADDING CAST ABS 4INX4YD NS (CAST SUPPLIES)
PADDING CAST ABS COTTON 4X4 ST (CAST SUPPLIES) IMPLANT
RUBBERBAND STERILE (MISCELLANEOUS) IMPLANT
STOCKINETTE 6  STRL (DRAPES) ×1
STOCKINETTE 6 STRL (DRAPES) ×1 IMPLANT
SUT VICRYL RAPIDE 4-0 (SUTURE) ×1 IMPLANT
SUT VICRYL RAPIDE 4/0 PS 2 (SUTURE) IMPLANT
SYR 10ML LL (SYRINGE) ×1 IMPLANT
SYR BULB 3OZ (MISCELLANEOUS) ×2 IMPLANT
TOWEL OR 17X24 6PK STRL BLUE (TOWEL DISPOSABLE) ×2 IMPLANT
TOWEL OR NON WOVEN STRL DISP B (DISPOSABLE) ×2 IMPLANT
UNDERPAD 30X30 (UNDERPADS AND DIAPERS) ×2 IMPLANT

## 2016-11-16 NOTE — Anesthesia Procedure Notes (Signed)
Procedure Name: MAC Date/Time: 11/16/2016 1:20 PM Performed by: Toula Moos L Pre-anesthesia Checklist: Patient identified, Timeout performed, Emergency Drugs available, Suction available and Patient being monitored Patient Re-evaluated:Patient Re-evaluated prior to induction Oxygen Delivery Method: Simple face mask Preoxygenation: Pre-oxygenation with 100% oxygen

## 2016-11-16 NOTE — Interval H&P Note (Signed)
History and Physical Interval Note:  11/16/2016 1:09 PM  Christina Deleon  has presented today for surgery, with the diagnosis of RIGHT LITTLE FINGER MUCOUS CYST M67.441  The various methods of treatment have been discussed with the patient and family. After consideration of risks, benefits and other options for treatment, the patient has consented to  Procedure(s): RIGHT LITTLE FINGER CYST EXCISION (Right) as a surgical intervention .  The patient's history has been reviewed, patient examined, no change in status, stable for surgery.  I have reviewed the patient's chart and labs.  Questions were answered to the patient's satisfaction.     Jasminne Mealy A.

## 2016-11-16 NOTE — Anesthesia Postprocedure Evaluation (Signed)
Anesthesia Post Note  Patient: Christina Deleon  Procedure(s) Performed: Procedure(s) (LRB): RIGHT LITTLE FINGER CYST EXCISION (Right)     Patient location during evaluation: PACU Anesthesia Type: MAC Level of consciousness: awake and alert Pain management: pain level controlled Vital Signs Assessment: post-procedure vital signs reviewed and stable Respiratory status: spontaneous breathing, nonlabored ventilation, respiratory function stable and patient connected to nasal cannula oxygen Cardiovascular status: stable and blood pressure returned to baseline Anesthetic complications: no    Last Vitals:  Vitals:   11/16/16 1144 11/16/16 1356  BP: (!) 142/71 114/69  Pulse: 86 81  Resp: 18 14  Temp: 36.6 C (!) 36.4 C    Last Pain:  Vitals:   11/16/16 1356  TempSrc:   PainSc: 0-No pain                 [name]

## 2016-11-16 NOTE — Anesthesia Preprocedure Evaluation (Addendum)
Anesthesia Evaluation  Patient identified by MRN, date of birth, ID band Patient awake    Reviewed: Allergy & Precautions, NPO status , Patient's Chart, lab work & pertinent test results  History of Anesthesia Complications (+) PONV and history of anesthetic complications  Airway Mallampati: II  TM Distance: >3 FB Neck ROM: Full    Dental no notable dental hx.    Pulmonary asthma ,    Pulmonary exam normal breath sounds clear to auscultation       Cardiovascular hypertension, Normal cardiovascular exam Rhythm:Regular Rate:Normal     Neuro/Psych  Headaches, PSYCHIATRIC DISORDERS Depression negative neurological ROS  negative psych ROS   GI/Hepatic Neg liver ROS, GERD  Medicated,  Endo/Other  negative endocrine ROS  Renal/GU negative Renal ROS  negative genitourinary   Musculoskeletal  (+) Arthritis , Osteoarthritis,  Fibromyalgia -  Abdominal   Peds negative pediatric ROS (+)  Hematology negative hematology ROS (+)   Anesthesia Other Findings COLONIC POLYPS, BENIGN HYPERLIPIDEMIA DYSTHYMIC DISORDER Insomnia MIGRAINE, COMMON HYPERTENSION ALLERGIC RHINITIS ASTHMA, PERSISTENT, MILD GASTRITIS CONSTIPATION HEMATOCHEZIA 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), cervical NECK PAIN, CHRONIC LOW BACK PAIN, CHRONIC Impingement syndrome of shoulder, left PALPITATIONS COUGH CHEST PAIN, ATYPICAL NAUSEA ABDOMINAL PAIN-EPIGASTRIC PERSONAL HX COLONIC POLYPS Mild persistent asthma LPRD (laryngopharyngeal reflux disease) Allergic rhinoconjunctivitis GERD (gastroesophageal reflux disease) Snoring Periodic limb movement sleep disorder Leg pain Restless legs syndrome Fibromyalgia Other fatigue 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    Reproductive/Obstetrics negative OB ROS                            Anesthesia Physical Anesthesia  Plan  ASA: III  Anesthesia Plan: MAC   Post-op Pain Management:    Induction: Intravenous  PONV Risk Score and Plan: 2 and Ondansetron, Dexamethasone, Treatment may vary due to age or medical condition, Propofol and Metaclopromide  Airway Management Planned: Mask, Natural Airway and Nasal Cannula  Additional Equipment:   Intra-op Plan:   Post-operative Plan:   Informed Consent: I have reviewed the patients History and Physical, chart, labs and discussed the procedure including the risks, benefits and alternatives for the proposed anesthesia with the patient or authorized representative who has indicated his/her understanding and acceptance.   Dental advisory given  Plan Discussed with:   Anesthesia Plan Comments:        Anesthesia Quick Evaluation

## 2016-11-16 NOTE — Op Note (Signed)
11/16/2016  1:10 PM  PATIENT:  Christina Deleon  66 y.o. female  PRE-OPERATIVE DIAGNOSIS:  R SF mucous cyst at the DIP joint  POST-OPERATIVE DIAGNOSIS:  Same  PROCEDURE:  Excision right small finger mucous cyst, with joint osteophyte debridement  SURGEON: Rayvon Char. Grandville Silos, MD  PHYSICIAN ASSISTANT: Morley Kos, OPA-C  ANESTHESIA:  local and MAC  SPECIMENS:  None--cyst discarded  DRAINS:   None  EBL:  less than 50 mL  PREOPERATIVE INDICATIONS:  [name] is a  66 y.o. female with right small finger enlargement consistent with a mucous cyst despite nonoperative care.  The risks benefits and alternatives were discussed with the patient preoperatively including but not limited to the risks of infection, bleeding, nerve injury, cardiopulmonary complications, the need for revision surgery, among others, and the patient verbalized understanding and consented to proceed.  OPERATIVE IMPLANTS: None  OPERATIVE PROCEDURE:  After receiving prophylactic antibiotics, the patient was escorted to the operative theatre and placed in a supine position.  A digital block of the right small finger was performed by me with a mixture of lidocaine and Marcaine bearing epinephrine.  A surgical "time-out" was performed during which the planned procedure, proposed operative site, and the correct patient identity were compared to the operative consent and agreement confirmed by the circulating nurse according to current facility policy.  Following application of a tourniquet to the operative extremity, the exposed skin was prepped with Chloraprep and draped in the usual sterile fashion.  The limb was exsanguinated with an Esmarch bandage and the tourniquet inflated to approximately 131mHg higher than systolic BP.  An H-shaped incision was fashioned with the transverse limb across the dorsum of the digit over the prominence the mass.  Skin flaps were developed and reflected proximally and distally, with care  to protect and preserve the nail complex.  2 different small cysts were encountered, emanating from what appeared to be the surface of the extensor tendon over the proximal aspect of the distal phalanx.  These were excised and the stalks treated with bipolar electrocautery.  The dorsal radial and dorsal ulnar on the corners of the joint were debrided with a rongeur  , protecting the central extensor tendon.  Tourniquet was released, the wound irrigated, and the skin closed with 4-0 Vicryl Rapide interrupted sutures.  A light digital dressing was applied and she was taken to the recovery room in stable condition.  DISPOSITION: She'll be discharged home today with typical instructions, returning in 10-15 days.

## 2016-11-16 NOTE — Discharge Instructions (Signed)
Discharge Instructions   You have a light dressing on your hand.  You may begin gentle motion of your fingers and hand immediately, but you should not do any heavy lifting or gripping.  Elevate your hand to reduce pain & swelling of the digits.  Ice over the operative site may be helpful to reduce pain & swelling.  DO NOT USE HEAT. Pain medicine has been prescribed for you.  Take Tylenol 650 mg every 6 hours. Continue taking Relafen that you have at home as prescribed. Take Oxycodone 5mg  for severe break through pain as a rescue medicine. Leave the dressing in place until the third day after your surgery and then remove it, leaving it open to air.  After the bandage has been removed you may shower, regularly washing the incision and letting the water run over it, but not submerging it (no swimming, soaking it in dishwater, etc.) You may drive a car when you are off of prescription pain medications and can safely control your vehicle with both hands. We will address whether therapy will be required or not when you return to the office. You may have already made your follow-up appointment when we completed your preop visit.  If not, please call our office today or the next business day to make your return appointment for 10-15 days after surgery.   Please call [phone] during normal business hours or [phone] after hours for any problems. Including the following:  - excessive redness of the incisions - drainage for more than 4 days - fever of more than 101.5 F  *Please note that pain medications will not be refilled after hours or on weekends.    Post Anesthesia Home Care Instructions  Activity: Get plenty of rest for the remainder of the day. A responsible individual must stay with you for 24 hours following the procedure.  For the next 24 hours, DO NOT: -Drive a car -Paediatric nurse -Drink alcoholic beverages -Take any medication unless instructed by your physician -Make  any legal decisions or sign important papers.  Meals: Start with liquid foods such as gelatin or soup. Progress to regular foods as tolerated. Avoid greasy, spicy, heavy foods. If nausea and/or vomiting occur, drink only clear liquids until the nausea and/or vomiting subsides. Call your physician if vomiting continues.  Special Instructions/Symptoms: Your throat may feel dry or sore from the anesthesia or the breathing tube placed in your throat during surgery. If this causes discomfort, gargle with warm salt water. The discomfort should disappear within 24 hours.  If you had a scopolamine patch placed behind your ear for the management of post- operative nausea and/or vomiting:  1. The medication in the patch is effective for 72 hours, after which it should be removed.  Wrap patch in a tissue and discard in the trash. Wash hands thoroughly with soap and water. 2. You may remove the patch earlier than 72 hours if you experience unpleasant side effects which may include dry mouth, dizziness or visual disturbances. 3. Avoid touching the patch. Wash your hands with soap and water after contact with the patch.

## 2016-11-16 NOTE — Transfer of Care (Signed)
Immediate Anesthesia Transfer of Care Note  Patient: Christina Deleon  Procedure(s) Performed: Procedure(s): RIGHT LITTLE FINGER CYST EXCISION (Right)  Patient Location: PACU  Anesthesia Type:MAC  Level of Consciousness: awake and patient cooperative  Airway & Oxygen Therapy: Patient Spontanous Breathing and Patient connected to face mask oxygen  Post-op Assessment: Report given to RN and Post -op Vital signs reviewed and stable  Post vital signs: Reviewed and stable  Last Vitals:  Vitals:   11/16/16 1144  BP: (!) 142/71  Pulse: 86  Resp: 18  Temp: 36.6 C    Last Pain:  Vitals:   11/16/16 1144  TempSrc: Oral         Complications: No apparent anesthesia complications

## 2016-11-17 ENCOUNTER — Other Ambulatory Visit: Payer: Self-pay | Admitting: Rheumatology

## 2016-11-17 ENCOUNTER — Other Ambulatory Visit: Payer: Self-pay | Admitting: Neurology

## 2016-11-17 NOTE — Telephone Encounter (Signed)
Last Visit: 10/18/16 Next Visit: 04/19/17  Okay to refill Nabumetone?

## 2016-11-17 NOTE — Telephone Encounter (Signed)
ok 

## 2016-11-18 ENCOUNTER — Encounter (HOSPITAL_BASED_OUTPATIENT_CLINIC_OR_DEPARTMENT_OTHER): Payer: Self-pay | Admitting: Orthopedic Surgery

## 2016-11-19 ENCOUNTER — Other Ambulatory Visit: Payer: Self-pay | Admitting: *Deleted

## 2016-11-19 ENCOUNTER — Telehealth: Payer: Self-pay | Admitting: Neurology

## 2016-11-19 ENCOUNTER — Other Ambulatory Visit: Payer: Self-pay | Admitting: Neurology

## 2016-11-19 MED ORDER — ZOLMITRIPTAN 2.5 MG PO TBDP: 2.5000 mg | ORAL_TABLET | ORAL | 0 refills | Status: DC | PRN

## 2016-11-19 NOTE — Telephone Encounter (Signed)
Patient notified that Rx was sent in.  She has appointment next week.

## 2016-11-19 NOTE — Telephone Encounter (Signed)
Patient needs an appointment

## 2016-11-19 NOTE — Telephone Encounter (Signed)
Entered in error

## 2016-11-19 NOTE — Telephone Encounter (Signed)
She is calling in regarding a refill request for her Zomig. Please Advise. Thanks

## 2016-11-19 NOTE — Telephone Encounter (Signed)
PT called and she said she needs a prescription called in for Zomig and she wants a call back to today as to whether or not this has been done, she said she can not go the weekend without this medication

## 2016-11-25 ENCOUNTER — Encounter: Payer: Self-pay | Admitting: Neurology

## 2016-11-25 ENCOUNTER — Other Ambulatory Visit: Payer: Medicare Other

## 2016-11-25 ENCOUNTER — Ambulatory Visit (INDEPENDENT_AMBULATORY_CARE_PROVIDER_SITE_OTHER): Payer: Medicare Other | Admitting: Neurology

## 2016-11-25 VITALS — BP 110/70 | HR 82 | Ht 64.0 in | Wt 171.1 lb

## 2016-11-25 DIAGNOSIS — E538 Deficiency of other specified B group vitamins: Secondary | ICD-10-CM | POA: Diagnosis not present

## 2016-11-25 DIAGNOSIS — G43101 Migraine with aura, not intractable, with status migrainosus: Secondary | ICD-10-CM

## 2016-11-25 DIAGNOSIS — G4761 Periodic limb movement disorder: Secondary | ICD-10-CM

## 2016-11-25 MED ORDER — ZOLMITRIPTAN 2.5 MG PO TBDP: 2.5000 mg | ORAL_TABLET | ORAL | 11 refills | Status: DC | PRN

## 2016-11-25 MED ORDER — METHOCARBAMOL 500 MG PO TABS: 500.0000 mg | ORAL_TABLET | Freq: Every day | ORAL | 3 refills | Status: DC | PRN

## 2016-11-25 NOTE — Patient Instructions (Addendum)
1.  Start iron supplemention 325mg  daily 2.  Continue zomig 2.5mg  as needed for migraines, limit to twice per week 3.  Continue robaxin as needed for muscle cramps and neck pain 4.  Consider trying alternative pillow to see if this helps with your neck pain 5.  Check vitamin B12  Return to clinic in 1 year

## 2016-11-25 NOTE — Progress Notes (Signed)
Follow-up Visit   Date: 11/25/16    Christina Deleon MRN: [id number] DOB: 1950-06-27   Interim History: Christina Deleon is a 66 y.o. right-handed African American female with history of GERD, hypertension, hyperlipidemia, fibromyalgia, and migraines returning to the clinic for follow-up of periodic limb movement of sleep and migraines.  History of present illness: Starting around 2005, her husband started noticing right leg movements especially at night. She lays still about 20-30 seconds followed by episodic jerking of the legs, especially the right side. This occurs only at nighttime for the first few hours of sleep. She takes [place] and about 40 minutes later, the movements start. She had a sleep study and was told that she did have "movement of the legs". She endorses vivid dreams. No history of sleep walking. She has no taken any medications.   She has a history of migraines starting in her teens. Pain is usually on the right side, described as throbbing/sharp pain. She has associated nausea, photosensitivity, and phonophobia. Duration is 4 hr, if she takes zomig, but they typically will occur for the next 3-4 days. She takes zomig which has controlled her headaches. She was started on topamax, but does not take it daily because of side effects.   She complains of one-month history of dizziness, described as lightheadedness. She feels nauseous when she moves her head too quickly. She denies any double vision. It is worse when she bends down to pick up something off the floor. No ear pain or tinnitus.  Dizziness resolved with hydration.  UPDATE 10/29/2015:   She reports having a rough year with two bouts of bronchitis, asthma, and flu.  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  She complains of right foot cramps, which can be severe at times and last several days.  Headaches were intermittent worse several months ago while she was also dealing with  bronchitis, but after she was placed on prednisone, headaches improved.  She continues to use zomig about 2-3 days per month, which provides relief within 30-minutes.     UPDATE 11/25/2016:  She is here for 1 year follow-up visit and is overall doing well from a neurological stand point.  Headaches are well-controlled on zomig 2.56m which she takes about 1-2 times per week.  She underwent right 5th digit cyst removal and anesthesia associated with the procedure exacerbated her headaches, so she was taking zomig more frequently during this time.  Cramps and leg movements are not as frequent.  She is not complaint with her iron or vitamin B12 supplementation and continues to report low energy.    Medications:  Current Outpatient Prescriptions on File Prior to Visit  Medication Sig Dispense Refill  . acetaminophen (TYLENOL) 325 MG tablet Take 2 tablets (650 mg total) by mouth every 6 (six) hours as needed for mild pain or moderate pain.    .Marland Kitchenalbuterol (PROAIR HFA) 108 (90 BASE) MCG/ACT inhaler Inhale 2 puffs into the lungs every 6 (six) hours as needed for wheezing or shortness of breath. 1 Inhaler 1  . Ascorbic Acid (VITAMIN C PO) Take 1 tablet by mouth daily.    . Azelastine-Fluticasone (DYMISTA) 137-50 MCG/ACT SUSP Place 1 spray into the nose daily. 1 Bottle 0  . budesonide-formoterol (SYMBICORT) 80-4.5 MCG/ACT inhaler Inhale 2 puffs into the lungs 2 (two) times daily. 1 Inhaler 0  . CALCIUM PO Take 1 tablet by mouth daily.    . candesartan (  ATACAND) 16 MG tablet Take 8 mg by mouth at bedtime.     . cetirizine (ZYRTEC) 10 MG tablet Take 1 tablet (10 mg total) by mouth daily. One tab daily for allergies 30 tablet 1  . Cyanocobalamin (VITAMIN B-12 PO) Take 1 tablet by mouth daily.    . fluticasone (FLONASE) 50 MCG/ACT nasal spray instill 2 sprays into each nostril once daily 16 g 3  . montelukast (SINGULAIR) 10 MG tablet Take 1 tablet (10 mg total) by mouth at bedtime. (Patient taking differently: Take  10 mg by mouth daily. ) 90 tablet 1  . Multiple Vitamin (MULTIVITAMIN) tablet Take 1 tablet by mouth daily. Calcium, Vitamin B-12, Vitamin A, Vitamin C    . nabumetone (RELAFEN) 500 MG tablet take 1 tablet by mouth twice a day 60 tablet 1  . omeprazole (PRILOSEC) 40 MG capsule Take 1 capsule (40 mg total) by mouth 2 (two) times daily. 180 capsule 3  . oxyCODONE (ROXICODONE) 5 MG immediate release tablet Take 1 tablet (5 mg total) by mouth every 6 (six) hours as needed for breakthrough pain. 20 tablet 0  . polyethylene glycol powder (GLYCOLAX/MIRALAX) powder Take 17 g by mouth daily. (Patient taking differently: Take 17 g by mouth daily as needed. ) 850 g 1  . pravastatin (PRAVACHOL) 20 MG tablet Take 20 mg by mouth daily.      . ranitidine (ZANTAC) 300 MG tablet Take 1 tablet (300 mg total) by mouth at bedtime. 90 tablet 1  . RESTASIS MULTIDOSE 0.05 % ophthalmic emulsion     . VITAMIN A PO Take by mouth daily.    Marland Kitchen zolpidem (AMBIEN) 5 MG tablet take 1 to 2 tablets by mouth at bedtime if needed 60 tablet 4  . [DISCONTINUED] dexlansoprazole (DEXILANT) 60 MG capsule Take 60 mg by mouth daily.       No current facility-administered medications on file prior to visit.     Allergies:  Allergies  Allergen Reactions  . Trazodone Hcl Other (See Comments)    REACTION: bad headaches  . Penicillins Nausea Only and Rash    Patient has taken Amoxicillin without an issue, per patient. Has patient had a PCN reaction causing immediate rash, facial/tongue/throat swelling, SOB or lightheadedness with hypotension: No Has patient had a PCN reaction causing severe rash involving mucus membranes or skin necrosis: No Has patient had a PCN reaction that required hospitalization No Has patient had a PCN reaction occurring within the last 10 years: No If all of the above answers are "NO", then may proceed with Cephalosporin use.      Review of Systems:  CONSTITUTIONAL: No fevers, chills, night sweats, or  weight loss.   EYES: No visual changes or eye pain ENT: No hearing changes.  No history of nose bleeds.   RESPIRATORY: No cough, wheezing and shortness of breath.   CARDIOVASCULAR: Negative for chest pain, and palpitations.   GI: Negative for abdominal discomfort, blood in stools or black stools.  No recent change in bowel habits.   GU:  No history of incontinence.   MUSCLOSKELETAL: +history of joint pain or swelling.  +myalgias.   SKIN: Negative for lesions, rash, and itching.   ENDOCRINE: Negative for cold or heat intolerance, polydipsia or goiter.   PSYCH:  No depression or anxiety symptoms.   NEURO: As Above.   Vital Signs:  BP 110/70   Pulse 82   Ht '5\' 4"'  (1.626 m)   Wt 171 lb 1 oz (77.6 kg)  LMP 07/02/1994   SpO2 98%   BMI 29.36 kg/m   Neurological Exam: MENTAL STATUS including orientation to time, place, person is normal.  Speech is not dysarthric.  CRANIAL NERVES:  Pupils equal round and reactive to light. Face is symmetric.   MOTOR:  Motor strength is 5/5 in all extremities.    MSRs:  Reflexes are 2+/4 throughout  COORDINATION/GAIT:  Gait narrow based and stable.  Data: Labs 11/06/2013:  ESR 15, vitamin B12 241, TSH 1.9, ferritin 37  EEG 11/14/2013: This awake and asleep EEG is within normal limits.  MRI cervical spine wo contrast 05/14/2008: Cervical disc degeneration and spondylosis as above. No change from 2005.   MRI brain wo contrast 03/17/2004:  Unremarkable MRI of the brain. No acute findings. benign arachnoid granulations (giant pacchionian granulations), and I feel that this finding represents a normal anatomic variant as opposed to pathology. At this point, I cannot make the diagnosis of a pathologic lesion such as an eosinophilic granuloma. Again I favor that this represents a benign process, i.e. giant arachnoid granulations.   MRI/A brain and MRA neck 12/05/2013: 1. Stable and negative MRI appearance of the brain; inadvertently  diffusion-weighted imaging  was not performed. Attempts will be made to brain the patient back for diffusion weighted imaging, and an addendum will be dictated.  2. Negative MRA head and neck.    IMPRESSION/PLAN: 1. Periodic limb movements of sleep - infrequent and stable  - Encouraged to be compliant with iron supplements 370m daily  - Robaxin 5034mprn cramps   2. Episodic migraine and cervicalgia  - Headaches days ~4-6/month and well controlled on zomig 2.19m32ms needed (limit to twice per week)  - She may also use robaxin for neck pain and was encouraged to use a pillow that supports neck better  3. Lightheadedness/dizziness causing gait instability - resolved  - MRI/A brain and MRA neck is normal  4.  Low energy, malaise, history of low vitamin B12 off supplementation  - Check vitamin B12  Return to clinic in 1 year  The duration of this appointment visit was 25 minutes of face-to-face time with the patient.  Greater than 50% of this time was spent in counseling, explanation of diagnosis, planning of further management, and coordination of care.   Thank you for allowing me to participate in patient's care.  If I can answer any additional questions, I would be pleased to do so.    Sincerely,    Kamber Vignola K. PatPosey ProntoO

## 2016-11-26 ENCOUNTER — Telehealth: Payer: Self-pay | Admitting: *Deleted

## 2016-11-26 LAB — VITAMIN B12: Vitamin B-12: 216 pg/mL (ref 200–1100)

## 2016-11-26 NOTE — Telephone Encounter (Signed)
Left message for patient to call me back. 

## 2016-11-26 NOTE — Telephone Encounter (Signed)
-----   Message from Christina Berthold, DO sent at 11/26/2016 10:11 AM EDT ----- Please inform patient that her vitamin B12 is low-normal and I recommend that she start vitamin B12 1030mcg daily.  Thanks.

## 2016-11-30 ENCOUNTER — Encounter: Payer: Self-pay | Admitting: *Deleted

## 2016-11-30 ENCOUNTER — Telehealth: Payer: Self-pay | Admitting: *Deleted

## 2016-11-30 DIAGNOSIS — R300 Dysuria: Secondary | ICD-10-CM | POA: Diagnosis not present

## 2016-11-30 NOTE — Telephone Encounter (Signed)
Called patient again and left message for her to call me.  Also sent a note via My Chart.  Will also mail letter.

## 2016-11-30 NOTE — Telephone Encounter (Signed)
-----   Message from Christina Berthold, DO sent at 11/26/2016 10:11 AM EDT ----- Please inform patient that her vitamin B12 is low-normal and I recommend that she start vitamin B12 1053mcg daily.  Thanks.

## 2016-12-02 DIAGNOSIS — M67441 Ganglion, right hand: Secondary | ICD-10-CM | POA: Diagnosis not present

## 2016-12-17 ENCOUNTER — Other Ambulatory Visit: Payer: Self-pay | Admitting: Allergy and Immunology

## 2016-12-31 ENCOUNTER — Telehealth: Payer: Self-pay | Admitting: Pulmonary Disease

## 2016-12-31 MED ORDER — AZELASTINE-FLUTICASONE 137-50 MCG/ACT NA SUSP: 1.0000 | Freq: Every day | NASAL | 11 refills | Status: DC

## 2016-12-31 NOTE — Telephone Encounter (Signed)
Called and spoke with pt and she is aware of the dymista that has been sent to her pharmacy. Pt stated that this rx helped her and she wanted to continue to use this.

## 2017-01-20 DIAGNOSIS — E559 Vitamin D deficiency, unspecified: Secondary | ICD-10-CM | POA: Diagnosis not present

## 2017-01-20 DIAGNOSIS — E041 Nontoxic single thyroid nodule: Secondary | ICD-10-CM | POA: Diagnosis not present

## 2017-01-20 DIAGNOSIS — G43109 Migraine with aura, not intractable, without status migrainosus: Secondary | ICD-10-CM | POA: Diagnosis not present

## 2017-01-20 DIAGNOSIS — J4541 Moderate persistent asthma with (acute) exacerbation: Secondary | ICD-10-CM | POA: Diagnosis not present

## 2017-01-20 DIAGNOSIS — Z0000 Encounter for general adult medical examination without abnormal findings: Secondary | ICD-10-CM | POA: Diagnosis not present

## 2017-01-20 DIAGNOSIS — Z1389 Encounter for screening for other disorder: Secondary | ICD-10-CM | POA: Diagnosis not present

## 2017-01-20 DIAGNOSIS — K5900 Constipation, unspecified: Secondary | ICD-10-CM | POA: Diagnosis not present

## 2017-01-20 DIAGNOSIS — E785 Hyperlipidemia, unspecified: Secondary | ICD-10-CM | POA: Diagnosis not present

## 2017-01-20 DIAGNOSIS — I10 Essential (primary) hypertension: Secondary | ICD-10-CM | POA: Diagnosis not present

## 2017-01-25 DIAGNOSIS — D225 Melanocytic nevi of trunk: Secondary | ICD-10-CM | POA: Diagnosis not present

## 2017-01-25 DIAGNOSIS — L309 Dermatitis, unspecified: Secondary | ICD-10-CM | POA: Diagnosis not present

## 2017-01-25 DIAGNOSIS — R208 Other disturbances of skin sensation: Secondary | ICD-10-CM | POA: Diagnosis not present

## 2017-01-30 ENCOUNTER — Other Ambulatory Visit: Payer: Self-pay | Admitting: Gastroenterology

## 2017-02-18 ENCOUNTER — Ambulatory Visit: Payer: Medicare Other | Admitting: Nurse Practitioner

## 2017-02-21 ENCOUNTER — Encounter: Payer: Self-pay | Admitting: Obstetrics and Gynecology

## 2017-02-21 ENCOUNTER — Ambulatory Visit (INDEPENDENT_AMBULATORY_CARE_PROVIDER_SITE_OTHER): Payer: Medicare Other | Admitting: Obstetrics and Gynecology

## 2017-02-21 VITALS — BP 128/60 | HR 80 | Resp 14 | Ht 63.5 in | Wt 167.0 lb

## 2017-02-21 DIAGNOSIS — N816 Rectocele: Secondary | ICD-10-CM

## 2017-02-21 DIAGNOSIS — N763 Subacute and chronic vulvitis: Secondary | ICD-10-CM

## 2017-02-21 DIAGNOSIS — N8111 Cystocele, midline: Secondary | ICD-10-CM

## 2017-02-21 DIAGNOSIS — Z01419 Encounter for gynecological examination (general) (routine) without abnormal findings: Secondary | ICD-10-CM

## 2017-02-21 DIAGNOSIS — Z124 Encounter for screening for malignant neoplasm of cervix: Secondary | ICD-10-CM

## 2017-02-21 MED ORDER — BETAMETHASONE VALERATE 0.1 % EX OINT: TOPICAL_OINTMENT | CUTANEOUS | 0 refills | Status: DC

## 2017-02-21 NOTE — Patient Instructions (Addendum)
Lubricant with intercourse: KY jelly, astroglide, coconut oil or olive oil  EXERCISE AND DIET:  We recommended that you start or continue a regular exercise program for good health. Regular exercise means any activity that makes your heart beat faster and makes you sweat.  We recommend exercising at least 30 minutes per day at least 3 days a week, preferably 4 or 5.  We also recommend a diet low in fat and sugar.  Inactivity, poor dietary choices and obesity can cause diabetes, heart attack, stroke, and kidney damage, among others.    ALCOHOL AND SMOKING:  Women should limit their alcohol intake to no more than 7 drinks/beers/glasses of wine (combined, not each!) per week. Moderation of alcohol intake to this level decreases your risk of breast cancer and liver damage. And of course, no recreational drugs are part of a healthy lifestyle.  And absolutely no smoking or even second hand smoke. Most people know smoking can cause heart and lung diseases, but did you know it also contributes to weakening of your bones? Aging of your skin?  Yellowing of your teeth and nails?  CALCIUM AND VITAMIN D:  Adequate intake of calcium and Vitamin D are recommended.  The recommendations for exact amounts of these supplements seem to change often, but generally speaking 600 mg of calcium (either carbonate or citrate) and 800 units of Vitamin D per day seems prudent. Certain women may benefit from higher intake of Vitamin D.  If you are among these women, your doctor will have told you during your visit.    PAP SMEARS:  Pap smears, to check for cervical cancer or precancers,  have traditionally been done yearly, although recent scientific advances have shown that most women can have pap smears less often.  However, every woman still should have a physical exam from her gynecologist every year. It will include a breast check, inspection of the vulva and vagina to check for abnormal growths or skin changes, a visual exam of the  cervix, and then an exam to evaluate the size and shape of the uterus and ovaries.  And after 67 years of age, a rectal exam is indicated to check for rectal cancers. We will also provide age appropriate advice regarding health maintenance, like when you should have certain vaccines, screening for sexually transmitted diseases, bone density testing, colonoscopy, mammograms, etc.   MAMMOGRAMS:  All women over 37 years old should have a yearly mammogram. Many facilities now offer a "3D" mammogram, which may cost around $50 extra out of pocket. If possible,  we recommend you accept the option to have the 3D mammogram performed.  It both reduces the number of women who will be called back for extra views which then turn out to be normal, and it is better than the routine mammogram at detecting truly abnormal areas.    COLONOSCOPY:  Colonoscopy to screen for colon cancer is recommended for all women at age 75.  We know, you hate the idea of the prep.  We agree, BUT, having colon cancer and not knowing it is worse!!  Colon cancer so often starts as a polyp that can be seen and removed at colonscopy, which can quite literally save your life!  And if your first colonoscopy is normal and you have no family history of colon cancer, most women don't have to have it again for 10 years.  Once every ten years, you can do something that may end up saving your life, right?  We will  be happy to help you get it scheduled when you are ready.  Be sure to check your insurance coverage so you understand how much it will cost.  It may be covered as a preventative service at no cost, but you should check your particular policy.      Kegel Exercises Kegel exercises help strengthen the muscles that support the rectum, vagina, small intestine, bladder, and uterus. Doing Kegel exercises can help:  Improve bladder and bowel control.  Improve sexual response.  Reduce problems and discomfort during pregnancy.  Kegel exercises  involve squeezing your pelvic floor muscles, which are the same muscles you squeeze when you try to stop the flow of urine. The exercises can be done while sitting, standing, or lying down, but it is best to vary your position. Phase 1 exercises 1. Squeeze your pelvic floor muscles tight. You should feel a tight lift in your rectal area. If you are a female, you should also feel a tightness in your vaginal area. Keep your stomach, buttocks, and legs relaxed. 2. Hold the muscles tight for up to 10 seconds. 3. Relax your muscles. Repeat this exercise 50 times a day or as many times as told by your health care provider. Continue to do this exercise for at least 4-6 weeks or for as long as told by your health care provider. This information is not intended to replace advice given to you by your health care provider. Make sure you discuss any questions you have with your health care provider. Document Released: 04/05/2012 Document Revised: 12/13/2015 Document Reviewed: 03/09/2015 Elsevier Interactive Patient Education  [employer].

## 2017-02-21 NOTE — Addendum Note (Signed)
Addended by: Susanne Greenhouse E on: 02/21/2017 04:27 PM   Modules accepted: Orders

## 2017-02-21 NOTE — Progress Notes (Signed)
66 y.o. [id number] MarriedAfrican AmericanF here for annual exam.  H/O vaginal hysterectomy for prolapse.  She c/o dryness at the opening of the vagina, labia stick together. She c/o intermittent vaginal itching. No abnormal d/c, no bleeding. No dyspareunia, uses vaseline as a lubricant.     Patient's last menstrual period was 07/02/1994.          Sexually active: Yes.    The current method of family planning is status post hysterectomy.    Exercising: No.  The patient does not participate in regular exercise at present. Smoker:  no  Health Maintenance: Pap:  unsure History of abnormal Pap:  no MMG:  01-31-16 WNL  Colonoscopy:  05-18-13 WNL, f/u in 5 years BMD:   04-11-14 WNL  TDaP:  11-20-09  Gardasil: N/A   reports that she is a non-smoker but has been exposed to tobacco smoke. She has never used smokeless tobacco. She reports that she does not drink alcohol or use drugs. Retired, she is caring for her Mom who is 3 hours away. Constantly going to her mom's to help out. Mom is 64. Shikara prepares all of her food. 2 grown children, one in [place] and one in [place]. She has 2 grand children and 1 great grand baby.  First husband died in 08-13-97, [place]. This is her second marriage, good man.   Past Medical History:  Diagnosis Date  . Adenomatous colon polyp 10/1997, and 03/2009  . Allergic rhinitis   . Allergy   . Arthritis    osteo  . Asthma   . Chronic gastritis   . Fibromyalgia   . GERD (gastroesophageal reflux disease)   . Hemorrhoids   . Hyperlipidemia   . Hypertension   . Menorrhagia    partial hysterectomy  . Migraines   . PONV (postoperative nausea and vomiting)   . Sleep disturbance   . 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 CATARACT EXTRACTION Bilateral 10/16 & 11/16  . EXCISION METACARPAL MASS Right 11/16/2016   Procedure: RIGHT LITTLE FINGER CYST EXCISION;  Surgeon: Milly Jakob,  MD;  Location: [facility];  Service: Orthopedics;  Laterality: Right;  . TONSILLECTOMY  age 43  . tooth extract     with bone graft  . Turbinate sinus surgery  Aug 13, 2001  . VAGINAL HYSTERECTOMY  1998   secondary to prolapse, ovaries remain    Current Outpatient Prescriptions  Medication Sig Dispense Refill  . albuterol (PROAIR HFA) 108 (90 BASE) MCG/ACT inhaler Inhale 2 puffs into the lungs every 6 (six) hours as needed for wheezing or shortness of breath. 1 Inhaler 1  . Ascorbic Acid (VITAMIN C PO) Take 1 tablet by mouth daily.    . Azelastine-Fluticasone 137-50 MCG/ACT SUSP Place 1 spray into the nose daily. 23 g 11  . budesonide-formoterol (SYMBICORT) 80-4.5 MCG/ACT inhaler Inhale 2 puffs into the lungs 2 (two) times daily. 1 Inhaler 0  . CALCIUM PO Take 1 tablet by mouth daily.    . candesartan (ATACAND) 16 MG tablet Take 8 mg by mouth at bedtime.     . cetirizine (ZYRTEC) 10 MG tablet Take 1 tablet (10 mg total) by mouth daily. One tab daily for allergies 30 tablet 1  . Cholecalciferol (VITAMIN D) 2000 units tablet Take 2,000 Units by mouth daily.    . Cyanocobalamin (VITAMIN B-12 PO) Take 1 tablet by mouth daily.    Marland Kitchen  fluticasone (FLONASE) 50 MCG/ACT nasal spray instill 2 sprays into each nostril once daily 16 g 3  . methocarbamol (ROBAXIN) 500 MG tablet Take 1 tablet (500 mg total) by mouth daily as needed for muscle spasms. 90 tablet 3  . montelukast (SINGULAIR) 10 MG tablet take 1 tablet by mouth at bedtime 90 tablet 0  . Multiple Vitamin (MULTIVITAMIN) tablet Take 1 tablet by mouth daily. Calcium, Vitamin B-12, Vitamin A, Vitamin C    . nabumetone (RELAFEN) 500 MG tablet take 1 tablet by mouth twice a day 60 tablet 1  . omeprazole (PRILOSEC) 40 MG capsule take 1 capsule by mouth twice a day 180 capsule 0  . polyethylene glycol powder (GLYCOLAX/MIRALAX) powder Take 17 g by mouth daily. (Patient taking differently: Take 17 g by mouth daily as needed. ) 850 g 1  .  pravastatin (PRAVACHOL) 20 MG tablet Take 20 mg by mouth daily.      . ranitidine (ZANTAC) 300 MG tablet Take 1 tablet (300 mg total) by mouth at bedtime. 90 tablet 1  . RESTASIS MULTIDOSE 0.05 % ophthalmic emulsion     . VITAMIN A PO Take by mouth daily.    Marland Kitchen zolmitriptan (ZOMIG ZMT) 2.5 MG disintegrating tablet Take 1 tablet (2.5 mg total) by mouth as needed for migraine. 10 tablet 11  . zolpidem (AMBIEN) 5 MG tablet take 1 to 2 tablets by mouth at bedtime if needed 60 tablet 4   No current facility-administered medications for this visit.     Family History  Problem Relation Age of Onset  . Throat cancer Father   . Coronary artery disease Father   . Heart attack Father 29  . Stroke Father        during procedure  . Coronary artery disease Mother   . Heart failure Mother   . Hypertension Mother   . Hyperlipidemia Mother   . Pulmonary fibrosis Mother   . Lung disease Mother   . Prostate cancer Brother   . Heart disease Brother   . Diabetes Paternal Aunt        x 2  . Diabetes Paternal Grandmother   . Coronary artery disease Paternal Grandmother   . Stroke Maternal Grandmother        or MI  . Prostate cancer Paternal Uncle     Review of Systems  Constitutional: Negative.   HENT: Negative.   Eyes: Negative.   Respiratory: Negative.   Cardiovascular: Negative.   Gastrointestinal: Negative.   Endocrine: Negative.   Genitourinary:       Vaginal dryness   Musculoskeletal: Negative.   Skin: Negative.   Allergic/Immunologic: Negative.   Neurological: Negative.   Psychiatric/Behavioral: Negative.     Exam:   BP 128/60 (BP Location: Right Arm, Patient Position: Sitting, Cuff Size: Normal)   Pulse 80   Resp 14   Ht 5' 3.5" (1.613 m)   Wt 167 lb (75.8 kg)   LMP 07/02/1994   BMI 29.12 kg/m   Weight change: @WEIGHTCHANGE @ Height:   Height: 5' 3.5" (161.3 cm)  Ht Readings from Last 3 Encounters:  02/21/17 5' 3.5" (1.613 m)  11/25/16 5\' 4"  (1.626 m)  11/16/16 5\' 4"   (1.626 m)    General appearance: alert, cooperative and appears stated age Head: Normocephalic, without obvious abnormality, atraumatic Neck: no adenopathy, supple, symmetrical, trachea midline and thyroid normal to inspection and palpation Lungs: clear to auscultation bilaterally Cardiovascular: regular rate and rhythm Breasts: normal appearance, no masses or tenderness Abdomen: soft,  non-tender; non distended,  no masses,  no organomegaly Extremities: extremities normal, atraumatic, no cyanosis or edema Skin: Skin color, texture, turgor normal. No rashes or lesions Lymph nodes: Cervical, supraclavicular, and axillary nodes normal. No abnormal inguinal nodes palpated Neurologic: Grossly normal   Pelvic: External genitalia:  no lesions, minimal agglutination of the labia minora to majora, no whitening, no fissures              Urethra:  normal appearing urethra with no masses, tenderness or lesions              Bartholins and Skenes: normal                 Vagina: normal appearing vagina with normal color and discharge, no lesions  Grade 1-2 cystocele and rectocele (examined supine with and without valsalva, minimal vault prolapse with valsalva              Cervix: absent               Bimanual Exam:  Uterus:  uterus absent              Adnexa: no mass, fullness, tenderness               Rectovaginal: Confirms               Anus:  normal sphincter tone, no lesions  Chaperone was present for exam.  A:  Well Woman with normal exam  H/O hysterectomy  Intermittent vulvovaginal itching, vulvitis  Cystocele and rectocele, not symptomatic  P:   No pap needed  Mammogram due  Labs with primary  Discussed prolapse, information given. Avoid heavy lifting and straining  Discussed breast self exam  Discussed calcium and vit D intake  Affirm sent  Can use Vaseline as needed  Will call in a steroid ointment for discomfort

## 2017-02-22 LAB — VAGINITIS/VAGINOSIS, DNA PROBE
CANDIDA SPECIES: POSITIVE — AB
GARDNERELLA VAGINALIS: NEGATIVE
TRICHOMONAS VAG: NEGATIVE

## 2017-02-23 ENCOUNTER — Telehealth: Payer: Self-pay | Admitting: *Deleted

## 2017-02-23 NOTE — Telephone Encounter (Signed)
Left message to call regarding results -eh 

## 2017-02-23 NOTE — Telephone Encounter (Signed)
-----   Message from Salvadore Dom, MD sent at 02/23/2017  1:29 PM EDT ----- Please inform + for yeast and treat with diflucan 150 mg x 1, may repeat in 72 hours if still symptomatic. #2, no refills

## 2017-02-28 MED ORDER — FLUCONAZOLE 150 MG PO TABS: 150.0000 mg | ORAL_TABLET | Freq: Once | ORAL | 0 refills | Status: AC

## 2017-02-28 NOTE — Telephone Encounter (Signed)
She could have vaginal pressure from the prolapse. Unless her symptoms are bothering her, I wouldn't do anything about it. If she is uncomfortable she can come in to further discuss treatment options (usually I would recommend starting with a pessary).

## 2017-02-28 NOTE — Telephone Encounter (Signed)
-----   Message from Salvadore Dom, MD sent at 02/23/2017  1:29 PM EDT ----- Please inform + for yeast and treat with diflucan 150 mg x 1, may repeat in 72 hours if still symptomatic. #2, no refills

## 2017-02-28 NOTE — Telephone Encounter (Signed)
Left message for patient to return call -eh

## 2017-02-28 NOTE — Telephone Encounter (Signed)
Spoke with Christina Deleon and gave results. Sent in RX for yeast. Christina Deleon voiced understanding.   Christina Deleon states that when the office Dr. Talbert Nan told her she had some prolapse. She got to thinking about it after the visit and she thinks she is possibly having problems. She feels pressure when she urinates. No pain or burning, just the pressure. Her question is could this be from the prolapse.   Please advise

## 2017-03-04 DIAGNOSIS — Z23 Encounter for immunization: Secondary | ICD-10-CM | POA: Diagnosis not present

## 2017-04-01 DIAGNOSIS — Z1231 Encounter for screening mammogram for malignant neoplasm of breast: Secondary | ICD-10-CM | POA: Diagnosis not present

## 2017-04-06 NOTE — Progress Notes (Signed)
Office Visit Note  Patient: Christina Deleon             Date of Birth: [birth year]           MRN: [id number]             PCP: Lanice Shirts, MD Referring: Lanice Shirts, * Visit Date: 04/19/2017 Occupation: @GUAROCC @    Subjective:  Lower back and neck pain.   History of Present Illness: Christina Deleon is a 66 y.o. female with history of fibromyalgia, osteoarthritis and disc disease. She states she continues to have intermittent flares of fibromyalgia. She's been having discomfort in her neck and lower back which is been most bothersome. Her hands and  knees continue to be stiff. Her left shoulder joint is doing better. Her right plantar fasciitis is improved but still coughs some discomfort. She takes nabumetone 500 mg twice a day for flares which is been helpful. She has to take Ambien every night for chronic insomnia.  Activities of Daily Living:  Patient reports morning stiffness for all day hours.   Patient Reports nocturnal pain.  Difficulty dressing/grooming: Denies Difficulty climbing stairs: Reports Difficulty getting out of chair: Reports Difficulty using hands for taps, buttons, cutlery, and/or writing: Denies   Review of Systems  Constitutional: Positive for fatigue. Negative for night sweats, weight gain, weight loss and weakness.  HENT: Positive for mouth dryness. Negative for mouth sores, trouble swallowing, trouble swallowing and nose dryness.   Eyes: Positive for dryness. Negative for pain, redness and visual disturbance.  Respiratory: Negative for cough, shortness of breath and difficulty breathing.   Cardiovascular: Negative for chest pain, palpitations, hypertension, irregular heartbeat and swelling in legs/feet.  Gastrointestinal: Positive for constipation. Negative for blood in stool and diarrhea.  Endocrine: Negative for increased urination.  Genitourinary: Negative for vaginal dryness.  Musculoskeletal: Positive for arthralgias, joint pain,  myalgias, morning stiffness and myalgias. Negative for joint swelling, muscle weakness and muscle tenderness.  Skin: Negative for color change, rash, hair loss, skin tightness, ulcers and sensitivity to sunlight.  Allergic/Immunologic: Negative for susceptible to infections.  Neurological: Negative for dizziness, memory loss and night sweats.  Hematological: Negative for swollen glands.  Psychiatric/Behavioral: Positive for depressed mood and sleep disturbance. The patient is nervous/anxious.     PMFS History:  Patient Active Problem List   Diagnosis Date Noted  . Fibromyalgia 04/19/2016  . Other fatigue 04/19/2016  . 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 Primary osteoarthritis of both knees 04/19/2016  . Leg pain 01/22/2016  . Restless legs syndrome 01/22/2016  . Snoring 11/19/2015  . Periodic limb movement sleep disorder 11/19/2015  . GERD (gastroesophageal reflux disease) 10/08/2015  . Mild persistent asthma 04/09/2015  . LPRD (laryngopharyngeal reflux disease) 04/09/2015  . Allergic rhinoconjunctivitis 04/09/2015  . HEMATOCHEZIA 04/01/2010  . COUGH 04/01/2010  . GASTRITIS 09/30/2009  . NAUSEA 09/30/2009  . ABDOMINAL PAIN-EPIGASTRIC 09/30/2009  . DYSTHYMIC DISORDER 09/26/2009  . PALPITATIONS 06/17/2009  . Impingement syndrome of shoulder, left 06/04/2009  . CONSTIPATION 02/05/2009  . PERSONAL HX COLONIC POLYPS 02/05/2009  . ASTHMA, PERSISTENT, MILD 02/04/2009  . CHEST PAIN, ATYPICAL 02/04/2009  . COLONIC POLYPS, BENIGN 12/18/2008  . HYPERLIPIDEMIA 12/18/2008  . Insomnia 12/18/2008  . MIGRAINE, COMMON 12/18/2008  . HYPERTENSION 12/18/2008  . ALLERGIC RHINITIS 12/18/2008  . Primary osteoarthritis of both hands 12/18/2008  . DJD (degenerative joint disease), cervical 12/18/2008  . NECK PAIN, CHRONIC 12/18/2008  . LOW BACK PAIN, CHRONIC 12/18/2008    Past Medical  History:  Diagnosis Date  . Adenomatous colon polyp 10/1997, and 03/2009  . Allergic rhinitis   . Allergy   .  Arthritis    osteo  . Asthma   . Chronic gastritis   . Fibromyalgia   . GERD (gastroesophageal reflux disease)   . Hemorrhoids   . Hyperlipidemia   . Hypertension   . Menorrhagia    partial hysterectomy  . Migraines   . PONV (postoperative nausea and vomiting)   . Sleep disturbance   . STD (sexually transmitted disease) 01/22/11 culture proven   HSV type I labia    Family History  Problem Relation Age of Onset  . Throat cancer Father   . Coronary artery disease Father   . Heart attack Father 72  . Stroke Father        during procedure  . Coronary artery disease Mother   . Hypertension Mother   . Hyperlipidemia Mother   . Pulmonary fibrosis Mother   . Lung disease Mother   . Prostate cancer Brother   . Heart disease Brother   . Diabetes Paternal Aunt        x 2  . Diabetes Paternal Grandmother   . Coronary artery disease Paternal Grandmother   . Stroke Maternal Grandmother        or MI  . Prostate cancer Paternal Uncle    Past Surgical History:  Procedure Laterality Date  . CATARACT EXTRACTION Bilateral 10/16 & 11/16  . EXCISION METACARPAL MASS Right 11/16/2016   Procedure: RIGHT LITTLE FINGER CYST EXCISION;  Surgeon: Milly Jakob, MD;  Location: [facility];  Service: Orthopedics;  Laterality: Right;  . TONSILLECTOMY  age 34  . tooth extract     with bone graft  . Turbinate sinus surgery  2003  . VAGINAL HYSTERECTOMY  1998   secondary to prolapse, ovaries remain   Social History   Social History Narrative   She lives with husband.  They have 2 grown children.   She is retired Product/process development scientist.   Highest level of education:  2 years of college      Regular exercise, diet of fruits, veggies, limited fried foods, limited water      [facility]:   Originally from [place]. Has also lived in [place]. Previously worked doing Software engineer. No pets currently. No bird, mold, or hot tub exposure. Does have a musty smell  in their home but it is new. Enjoys reading. 1 indoor plant. Has carpet in her home including in the bedroom. Also has draperies.      Objective: Vital Signs: BP 137/77 (BP Location: Left Arm, Patient Position: Sitting, Cuff Size: Normal)   Pulse 87   Resp 14   Ht 5\' 4"  (1.626 m)   Wt 170 lb (77.1 kg)   LMP 07/02/1994   BMI 29.18 kg/m    Physical Exam  Constitutional: She is oriented to person, place, and time. She appears well-developed and well-nourished.  HENT:  Head: Normocephalic and atraumatic.  Eyes: Conjunctivae and EOM are normal.  Neck: Normal range of motion.  Cardiovascular: Normal rate, regular rhythm, normal heart sounds and intact distal pulses.  Pulmonary/Chest: Effort normal and breath sounds normal.  Abdominal: Soft. Bowel sounds are normal.  Lymphadenopathy:    She has no cervical adenopathy.  Neurological: She is alert and oriented to person, place, and time.  Skin: Skin is warm and dry. Capillary refill takes less than 2 seconds.  Psychiatric: She has a  normal mood and affect. Her behavior is normal.  Nursing note and vitals reviewed.    Musculoskeletal Exam: C-spine and thoracic and lumbar spine limited range of motion of his discomfort. Shoulder joints elbow joints wrist joints are good range of motion. She has some DIP thickening. Hip joints knee joints ankle joints are good range of motion. She is some crepitus in her bilateral knee joints without any warmth swelling or effusion. She has generalized hyperalgesia due to fibromyalgia.  CDAI Exam: No CDAI exam completed.    Investigation: No additional findings.   Imaging: No results found.  Speciality Comments: No specialty comments available.    Procedures:  No procedures performed Allergies: Trazodone hcl and Penicillins   Assessment / Plan:     Visit Diagnoses: Fibromyalgia: She continues to have some generalized pain and discomfort and positive tender points.  DDD lumbar spine - lower  back pain persist. She states nabumetone when necessary has been helpful. I offered physical therapy which she declined. I've given her back exercises handout.  DDD (degenerative disc disease), cervical: She continues to have some neck discomfort. A handout on neck exercises was given.  Other fatigue: Related to fibromyalgia and chronic insomnia.  Other insomnia: Controlled on Ambien 5 mg by mouth daily at bedtime.  Primary osteoarthritis of both hands - : She continues to have some stiffness in her hands.  Primary osteoarthritis of both knees: Chronic pain  Impingement syndrome of shoulder, left: Doing better  Plantar fasciitis, right: She's intermittent issues.  Medication monitoring encounter: She is taking long-term NSAIDs I will check her CBC with differential and CMP with GFR today.  Patient reports that she gets a bone density through her GYN.  Other medical problems are listed as follows:  History of asthma  History of hyperlipidemia  History of hypertension  History of gastroesophageal reflux (GERD)  History of colon polyps  History of migraine    Orders: Orders Placed This Encounter  Procedures  . CBC with Differential/Platelet  . COMPLETE METABOLIC PANEL WITH GFR   No orders of the defined types were placed in this encounter.   Face-to-face time spent with patient was 30 minutes. Greater than 50% of time was spent in counseling and coordination of care.  Follow-Up Instructions: Return in about 6 months (around 10/18/2017) for FMS< OA< DDD.   Bo Merino, MD  Note - This record has been created using Editor, commissioning.  Chart creation errors have been sought, but may not always  have been located. Such creation errors do not reflect on  the standard of medical care.

## 2017-04-12 ENCOUNTER — Other Ambulatory Visit: Payer: Self-pay | Admitting: Allergy and Immunology

## 2017-04-12 NOTE — Telephone Encounter (Signed)
Received fax for refill for montelukast. Patient was last seen 05/26/2016. Patient needs office visit for refills.

## 2017-04-15 ENCOUNTER — Other Ambulatory Visit: Payer: Self-pay | Admitting: Allergy and Immunology

## 2017-04-15 MED ORDER — MONTELUKAST SODIUM 10 MG PO TABS: 10.0000 mg | ORAL_TABLET | Freq: Every day | ORAL | 0 refills | Status: DC

## 2017-04-15 NOTE — Telephone Encounter (Signed)
Patient is requesting a refill on her Singulair; [facility] on [address]. She tried to fill it and it was rejected because she needed to be seen. Last seen 05-26-16. The soonest appt she could get, was January 29. She only wanted to see Dr. Neldon Mc.

## 2017-04-19 ENCOUNTER — Encounter: Payer: Self-pay | Admitting: Rheumatology

## 2017-04-19 ENCOUNTER — Ambulatory Visit (INDEPENDENT_AMBULATORY_CARE_PROVIDER_SITE_OTHER): Payer: Medicare Other | Admitting: Rheumatology

## 2017-04-19 VITALS — BP 137/77 | HR 87 | Resp 14 | Ht 64.0 in | Wt 170.0 lb

## 2017-04-19 DIAGNOSIS — M47816 Spondylosis without myelopathy or radiculopathy, lumbar region: Secondary | ICD-10-CM | POA: Diagnosis not present

## 2017-04-19 DIAGNOSIS — R5383 Other fatigue: Secondary | ICD-10-CM

## 2017-04-19 DIAGNOSIS — Z8639 Personal history of other endocrine, nutritional and metabolic disease: Secondary | ICD-10-CM | POA: Diagnosis not present

## 2017-04-19 DIAGNOSIS — Z86010 Personal history of colonic polyps: Secondary | ICD-10-CM

## 2017-04-19 DIAGNOSIS — M5030 Other cervical disc degeneration, unspecified cervical region: Secondary | ICD-10-CM | POA: Diagnosis not present

## 2017-04-19 DIAGNOSIS — Z5181 Encounter for therapeutic drug level monitoring: Secondary | ICD-10-CM | POA: Diagnosis not present

## 2017-04-19 DIAGNOSIS — Z8679 Personal history of other diseases of the circulatory system: Secondary | ICD-10-CM | POA: Diagnosis not present

## 2017-04-19 DIAGNOSIS — M797 Fibromyalgia: Secondary | ICD-10-CM

## 2017-04-19 DIAGNOSIS — M19042 Primary osteoarthritis, left hand: Secondary | ICD-10-CM

## 2017-04-19 DIAGNOSIS — Z8669 Personal history of other diseases of the nervous system and sense organs: Secondary | ICD-10-CM

## 2017-04-19 DIAGNOSIS — M19041 Primary osteoarthritis, right hand: Secondary | ICD-10-CM

## 2017-04-19 DIAGNOSIS — G4709 Other insomnia: Secondary | ICD-10-CM | POA: Diagnosis not present

## 2017-04-19 DIAGNOSIS — Z8709 Personal history of other diseases of the respiratory system: Secondary | ICD-10-CM

## 2017-04-19 DIAGNOSIS — M7542 Impingement syndrome of left shoulder: Secondary | ICD-10-CM

## 2017-04-19 DIAGNOSIS — M722 Plantar fascial fibromatosis: Secondary | ICD-10-CM | POA: Diagnosis not present

## 2017-04-19 DIAGNOSIS — M170 Bilateral primary osteoarthritis of knee: Secondary | ICD-10-CM

## 2017-04-19 DIAGNOSIS — Z8719 Personal history of other diseases of the digestive system: Secondary | ICD-10-CM

## 2017-04-19 LAB — CBC WITH DIFFERENTIAL/PLATELET
BASOS PCT: 1 %
Basophils Absolute: 39 cells/uL (ref 0–200)
EOS PCT: 3.1 %
Eosinophils Absolute: 121 cells/uL (ref 15–500)
HEMATOCRIT: 40.4 % (ref 35.0–45.0)
Hemoglobin: 13.7 g/dL (ref 11.7–15.5)
LYMPHS ABS: 1303 {cells}/uL (ref 850–3900)
MCH: 29.9 pg (ref 27.0–33.0)
MCHC: 33.9 g/dL (ref 32.0–36.0)
MCV: 88.2 fL (ref 80.0–100.0)
MPV: 9.3 fL (ref 7.5–12.5)
Monocytes Relative: 11.6 %
NEUTROS ABS: 1985 {cells}/uL (ref 1500–7800)
NEUTROS PCT: 50.9 %
Platelets: 359 10*3/uL (ref 140–400)
RBC: 4.58 10*6/uL (ref 3.80–5.10)
RDW: 12.6 % (ref 11.0–15.0)
Total Lymphocyte: 33.4 %
WBC: 3.9 10*3/uL (ref 3.8–10.8)
WBCMIX: 452 {cells}/uL (ref 200–950)

## 2017-04-19 LAB — COMPLETE METABOLIC PANEL WITH GFR
AG RATIO: 1.5 (calc) (ref 1.0–2.5)
ALT: 9 U/L (ref 6–29)
AST: 14 U/L (ref 10–35)
Albumin: 4 g/dL (ref 3.6–5.1)
Alkaline phosphatase (APISO): 72 U/L (ref 33–130)
BUN: 16 mg/dL (ref 7–25)
CALCIUM: 9.7 mg/dL (ref 8.6–10.4)
CO2: 30 mmol/L (ref 20–32)
CREATININE: 0.85 mg/dL (ref 0.50–0.99)
Chloride: 105 mmol/L (ref 98–110)
GFR, EST AFRICAN AMERICAN: 82 mL/min/{1.73_m2} (ref >=60)
GFR, EST NON AFRICAN AMERICAN: 71 mL/min/{1.73_m2} (ref >=60)
GLOBULIN: 2.7 g/dL (ref 1.9–3.7)
Glucose, Bld: 98 mg/dL (ref 65–99)
Potassium: 4.7 mmol/L (ref 3.5–5.3)
SODIUM: 141 mmol/L (ref 135–146)
TOTAL PROTEIN: 6.7 g/dL (ref 6.1–8.1)
Total Bilirubin: 0.3 mg/dL (ref 0.2–1.2)

## 2017-04-19 NOTE — Patient Instructions (Signed)
Cervical Strain and Sprain Rehab Ask your health care provider which exercises are safe for you. Do exercises exactly as told by your health care provider and adjust them as directed. It is normal to feel mild stretching, pulling, tightness, or discomfort as you do these exercises, but you should stop right away if you feel sudden pain or your pain gets worse.Do not begin these exercises until told by your health care provider. Stretching and range of motion exercises These exercises warm up your muscles and joints and improve the movement and flexibility of your neck. These exercises also help to relieve pain, numbness, and tingling. Exercise A: Cervical side bend  1. Using good posture, sit on a stable chair or stand up. 2. Without moving your shoulders, slowly tilt your left / right ear to your shoulder until you feel a stretch in your neck muscles. You should be looking straight ahead. 3. Hold for __________ seconds. 4. Repeat with the other side of your neck. Repeat __________ times. Complete this exercise __________ times a day. Exercise B: Cervical rotation  1. Using good posture, sit on a stable chair or stand up. 2. Slowly turn your head to the side as if you are looking over your left / right shoulder. ? Keep your eyes level with the ground. ? Stop when you feel a stretch along the side and the back of your neck. 3. Hold for __________ seconds. 4. Repeat this by turning to your other side. Repeat __________ times. Complete this exercise __________ times a day. Exercise C: Thoracic extension and pectoral stretch 1. Roll a towel or a small blanket so it is about 4 inches (10 cm) in diameter. 2. Lie down on your back on a firm surface. 3. Put the towel lengthwise, under your spine in the middle of your back. It should not be not under your shoulder blades. The towel should line up with your spine from your middle back to your lower back. 4. Put your hands behind your head and let your  elbows fall out to your sides. 5. Hold for __________ seconds. Repeat __________ times. Complete this exercise __________ times a day. Strengthening exercises These exercises build strength and endurance in your neck. Endurance is the ability to use your muscles for a long time, even after your muscles get tired. Exercise D: Upper cervical flexion, isometric 1. Lie on your back with a thin pillow behind your head and a small rolled-up towel under your neck. 2. Gently tuck your chin toward your chest and nod your head down to look toward your feet. Do not lift your head off the pillow. 3. Hold for __________ seconds. 4. Release the tension slowly. Relax your neck muscles completely before you repeat this exercise. Repeat __________ times. Complete this exercise __________ times a day. Exercise E: Cervical extension, isometric  1. Stand about 6 inches (15 cm) away from a wall, with your back facing the wall. 2. Place a soft object, about 6-8 inches (15-20 cm) in diameter, between the back of your head and the wall. A soft object could be a small pillow, a ball, or a folded towel. 3. Gently tilt your head back and press into the soft object. Keep your jaw and forehead relaxed. 4. Hold for __________ seconds. 5. Release the tension slowly. Relax your neck muscles completely before you repeat this exercise. Repeat __________ times. Complete this exercise __________ times a day. Posture and body mechanics  Body mechanics refers to the movements and positions of   your body while you do your daily activities. Posture is part of body mechanics. Good posture and healthy body mechanics can help to relieve stress in your body's tissues and joints. Good posture means that your spine is in its natural S-curve position (your spine is neutral), your shoulders are pulled back slightly, and your head is not tipped forward. The following are general guidelines for applying improved posture and body mechanics to  your everyday activities. Standing  When standing, keep your spine neutral and keep your feet about hip-width apart. Keep a slight bend in your knees. Your ears, shoulders, and hips should line up.  When you do a task in which you stand in one place for a long time, place one foot up on a stable object that is 2-4 inches (5-10 cm) high, such as a footstool. This helps keep your spine neutral. Sitting   When sitting, keep your spine neutral and your keep feet flat on the floor. Use a footrest, if necessary, and keep your thighs parallel to the floor. Avoid rounding your shoulders, and avoid tilting your head forward.  When working at a desk or a computer, keep your desk at a height where your hands are slightly lower than your elbows. Slide your chair under your desk so you are close enough to maintain good posture.  When working at a computer, place your monitor at a height where you are looking straight ahead and you do not have to tilt your head forward or downward to look at the screen. Resting When lying down and resting, avoid positions that are most painful for you. Try to support your neck in a neutral position. You can use a contour pillow or a small rolled-up towel. Your pillow should support your neck but not push on it. This information is not intended to replace advice given to you by your health care provider. Make sure you discuss any questions you have with your health care provider. Document Released: 04/19/2005 Document Revised: 12/25/2015 Document Reviewed: 03/26/2015 Elsevier Interactive Patient Education  2018 [employer] Back Exercises The following exercises strengthen the muscles that help to support the back. They also help to keep the lower back flexible. Doing these exercises can help to prevent back pain or lessen existing pain. If you have back pain or discomfort, try doing these exercises 2-3 times each day or as told by your health care provider. When the pain  goes away, do them once each day, but increase the number of times that you repeat the steps for each exercise (do more repetitions). If you do not have back pain or discomfort, do these exercises once each day or as told by your health care provider. Exercises Single Knee to Chest  Repeat these steps 3-5 times for each leg: 5. Lie on your back on a firm bed or the floor with your legs extended. 6. Bring one knee to your chest. Your other leg should stay extended and in contact with the floor. 7. Hold your knee in place by grabbing your knee or thigh. 8. Pull on your knee until you feel a gentle stretch in your lower back. 9. Hold the stretch for 10-30 seconds. 10. Slowly release and straighten your leg.  Pelvic Tilt  Repeat these steps 5-10 times: 1. Lie on your back on a firm bed or the floor with your legs extended. 2. Bend your knees so they are pointing toward the ceiling and your feet are flat on the floor. 3. Tighten   your lower abdominal muscles to press your lower back against the floor. This motion will tilt your pelvis so your tailbone points up toward the ceiling instead of pointing to your feet or the floor. 4. With gentle tension and even breathing, hold this position for 5-10 seconds.  Cat-Cow  Repeat these steps until your lower back becomes more flexible: 6. Get into a hands-and-knees position on a firm surface. Keep your hands under your shoulders, and keep your knees under your hips. You may place padding under your knees for comfort. 7. Let your head hang down, and point your tailbone toward the floor so your lower back becomes rounded like the back of a cat. 8. Hold this position for 5 seconds. 9. Slowly lift your head and point your tailbone up toward the ceiling so your back forms a sagging arch like the back of a cow. 10. Hold this position for 5 seconds.  Press-Ups  Repeat these steps 5-10 times: 5. Lie on your abdomen (face-down) on the floor. 6. Place your  palms near your head, about shoulder-width apart. 7. While you keep your back as relaxed as possible and keep your hips on the floor, slowly straighten your arms to raise the top half of your body and lift your shoulders. Do not use your back muscles to raise your upper torso. You may adjust the placement of your hands to make yourself more comfortable. 8. Hold this position for 5 seconds while you keep your back relaxed. 9. Slowly return to lying flat on the floor.  Bridges  Repeat these steps 10 times: 6. Lie on your back on a firm surface. 7. Bend your knees so they are pointing toward the ceiling and your feet are flat on the floor. 8. Tighten your buttocks muscles and lift your buttocks off of the floor until your waist is at almost the same height as your knees. You should feel the muscles working in your buttocks and the back of your thighs. If you do not feel these muscles, slide your feet 1-2 inches farther away from your buttocks. 9. Hold this position for 3-5 seconds. 10. Slowly lower your hips to the starting position, and allow your buttocks muscles to relax completely.  If this exercise is too easy, try doing it with your arms crossed over your chest. Abdominal Crunches  Repeat these steps 5-10 times: 1. Lie on your back on a firm bed or the floor with your legs extended. 2. Bend your knees so they are pointing toward the ceiling and your feet are flat on the floor. 3. Cross your arms over your chest. 4. Tip your chin slightly toward your chest without bending your neck. 5. Tighten your abdominal muscles and slowly raise your trunk (torso) high enough to lift your shoulder blades a tiny bit off of the floor. Avoid raising your torso higher than that, because it can put too much stress on your low back and it does not help to strengthen your abdominal muscles. 6. Slowly return to your starting position.  Back Lifts Repeat these steps 5-10 times: 1. Lie on your abdomen  (face-down) with your arms at your sides, and rest your forehead on the floor. 2. Tighten the muscles in your legs and your buttocks. 3. Slowly lift your chest off of the floor while you keep your hips pressed to the floor. Keep the back of your head in line with the curve in your back. Your eyes should be looking at the floor. 4.   Hold this position for 3-5 seconds. 5. Slowly return to your starting position.  Contact a health care provider if:  Your back pain or discomfort gets much worse when you do an exercise.  Your back pain or discomfort does not lessen within 2 hours after you exercise. If you have any of these problems, stop doing these exercises right away. Do not do them again unless your health care provider says that you can. Get help right away if:  You develop sudden, severe back pain. If this happens, stop doing the exercises right away. Do not do them again unless your health care provider says that you can. This information is not intended to replace advice given to you by your health care provider. Make sure you discuss any questions you have with your health care provider. Document Released: 05/27/2004 Document Revised: 08/27/2015 Document Reviewed: 06/13/2014 Elsevier Interactive Patient Education  2017 [employer]  

## 2017-04-20 NOTE — Progress Notes (Signed)
WNL

## 2017-04-29 DIAGNOSIS — J069 Acute upper respiratory infection, unspecified: Secondary | ICD-10-CM | POA: Diagnosis not present

## 2017-05-11 DIAGNOSIS — R05 Cough: Secondary | ICD-10-CM | POA: Diagnosis not present

## 2017-05-11 DIAGNOSIS — J4521 Mild intermittent asthma with (acute) exacerbation: Secondary | ICD-10-CM | POA: Diagnosis not present

## 2017-05-31 ENCOUNTER — Ambulatory Visit (INDEPENDENT_AMBULATORY_CARE_PROVIDER_SITE_OTHER): Payer: Medicare Other | Admitting: Allergy and Immunology

## 2017-05-31 VITALS — BP 136/74 | HR 88 | Resp 18

## 2017-05-31 DIAGNOSIS — J324 Chronic pansinusitis: Secondary | ICD-10-CM

## 2017-05-31 DIAGNOSIS — J4530 Mild persistent asthma, uncomplicated: Secondary | ICD-10-CM

## 2017-05-31 DIAGNOSIS — J3089 Other allergic rhinitis: Secondary | ICD-10-CM | POA: Diagnosis not present

## 2017-05-31 DIAGNOSIS — K219 Gastro-esophageal reflux disease without esophagitis: Secondary | ICD-10-CM | POA: Diagnosis not present

## 2017-05-31 MED ORDER — CLINDAMYCIN HCL 150 MG PO CAPS: 150.0000 mg | ORAL_CAPSULE | Freq: Three times a day (TID) | ORAL | 0 refills | Status: AC

## 2017-05-31 MED ORDER — DEXLANSOPRAZOLE 60 MG PO CPDR: 60.0000 mg | DELAYED_RELEASE_CAPSULE | Freq: Every day | ORAL | 5 refills | Status: DC

## 2017-05-31 MED ORDER — BECLOMETHASONE DIPROP HFA 80 MCG/ACT IN AERB: 2.0000 | INHALATION_SPRAY | Freq: Two times a day (BID) | RESPIRATORY_TRACT | 3 refills | Status: DC

## 2017-05-31 NOTE — Patient Instructions (Addendum)
  1.  Change omeprazole to Dexilant 60 mg  in the morning and continue ranitidine 300mg  in the evening  2. Start Qvar Redihaler 80 - two inhalations two times per day  3. Use your Dymista one spray each nostril two times per day  4. Continue montelukast 10mg  one time per day  5. Antibiotic: clindamycin 150 three times a day for 10 days only  4. Continue ProAir HFA and Zyrtec and nasal saline as needed  5. Return to clinic in 1 months or earlier if problem

## 2017-05-31 NOTE — Progress Notes (Signed)
Follow-up Note  Referring Provider: Lanice Shirts, * Primary Provider: Lanice Shirts, MD Date of Office Visit: 05/31/2017  Subjective:   Christina Deleon (DOB: 01-12-1951) is a 67 y.o. female who returns to the [facility] [place] on 05/31/2017 in re-evaluation of the following:  HPI: Christina Deleon presents to this clinic in reevaluation of her asthma and allergic rhinitis and LPR.  I have not seen her in this clinic in over a year.  Apparently she has seen a pulmonologist in the summer who felt that she had well-controlled asthma but significant problems with her upper airways.  She was placed on Dymista and Symbicort.  She does not use the Symbicort because it gives rise to chest burning.  She did have problems with Asmanex in the past as it gave rise to thrush.  She does like the effect that she gets with Dymista regarding her upper airways but she is worried about the side effects of utilizing this medication and thus is not really using Dymista.  She continues to have bad reflux even in the face of utilizing omeprazole and ranitidine.  She does not consume any caffeine or chocolate or alcohol.  Her last upper endoscopy was with Dr. Fuller Plan about 3 years ago.  She still continues to have lots of throat clearing and drainage.  December and January have been quite active regarding her respiratory tract.  Apparently this started while visiting family in [place] during Christmas and she developed a febrile illness associated with head congestion and chest congestion for which she was treated with an antibiotic and prednisone but she did not improve within 1 week and subsequently saw her primary care doctor who gave her a "shot" of steroids and prednisone once again.  Presently she has yellow nasal discharge and coughing and some occasional chest tightness and a rattle in her chest for which she uses a short acting bronchodilator which does help this issue.  She has been using a  bronchodilator on a daily basis.  As noted above she is not using a controller agent for her asthma and she is not using her Dymista.  Allergies as of 05/31/2017      Reactions   Trazodone Hcl Other (See Comments)   REACTION: bad headaches   Penicillins Nausea Only, Rash   Patient has taken Amoxicillin without an issue, per patient. Has patient had a PCN reaction causing immediate rash, facial/tongue/throat swelling, SOB or lightheadedness with hypotension: No Has patient had a PCN reaction causing severe rash involving mucus membranes or skin necrosis: No Has patient had a PCN reaction that required hospitalization No Has patient had a PCN reaction occurring within the last 10 years: No If all of the above answers are "NO", then may proceed with Cephalosporin use.      Medication List      albuterol 108 (90 Base) MCG/ACT inhaler Commonly known as:  PROAIR HFA Inhale 2 puffs into the lungs every 6 (six) hours as needed for wheezing or shortness of breath.   Azelastine-Fluticasone 137-50 MCG/ACT Susp Place 1 spray into the nose daily.   betamethasone valerate ointment 0.1 % Commonly known as:  VALISONE Place a pea sized amount topically BID for one to weeks as needed. Not for daily long term use   budesonide-formoterol 80-4.5 MCG/ACT inhaler Commonly known as:  SYMBICORT Inhale 2 puffs into the lungs 2 (two) times daily.   CALCIUM PO Take 1 tablet by mouth daily.   candesartan 16 MG  tablet Commonly known as:  ATACAND Take 8 mg by mouth at bedtime.   cetirizine 10 MG tablet Commonly known as:  ZYRTEC Take 1 tablet (10 mg total) by mouth daily. One tab daily for allergies   fluticasone 50 MCG/ACT nasal spray Commonly known as:  FLONASE instill 2 sprays into each nostril once daily   methocarbamol 500 MG tablet Commonly known as:  ROBAXIN Take 1 tablet (500 mg total) by mouth daily as needed for muscle spasms.   montelukast 10 MG tablet Commonly known as:   SINGULAIR Take 1 tablet (10 mg total) by mouth at bedtime.   multivitamin tablet Take 1 tablet by mouth daily. Calcium, Vitamin B-12, Vitamin A, Vitamin C   nabumetone 500 MG tablet Commonly known as:  RELAFEN take 1 tablet by mouth twice a day   omeprazole 40 MG capsule Commonly known as:  PRILOSEC take 1 capsule by mouth twice a day   polyethylene glycol powder powder Commonly known as:  GLYCOLAX/MIRALAX Take 17 g by mouth daily.   pravastatin 20 MG tablet Commonly known as:  PRAVACHOL Take 20 mg by mouth daily.   ranitidine 300 MG tablet Commonly known as:  ZANTAC Take 1 tablet (300 mg total) by mouth at bedtime.   RESTASIS MULTIDOSE 0.05 % ophthalmic emulsion Generic drug:  cycloSPORINE   VITAMIN A PO Take by mouth daily.   VITAMIN B-12 PO Take 1 tablet by mouth daily.   VITAMIN C PO Take 1 tablet by mouth daily.   Vitamin D 2000 units tablet Take 2,000 Units by mouth daily.   zolmitriptan 2.5 MG disintegrating tablet Commonly known as:  ZOMIG ZMT Take 1 tablet (2.5 mg total) by mouth as needed for migraine.   zolpidem 5 MG tablet Commonly known as:  AMBIEN take 1 to 2 tablets by mouth at bedtime if needed       Past Medical History:  Diagnosis Date  . Adenomatous colon polyp 10/1997, and 03/2009  . Allergic rhinitis   . Allergy   . Arthritis    osteo  . Asthma   . Chronic gastritis   . Fibromyalgia   . GERD (gastroesophageal reflux disease)   . Hemorrhoids   . Hyperlipidemia   . Hypertension   . Menorrhagia    partial hysterectomy  . Migraines   . PONV (postoperative nausea and vomiting)   . Sleep disturbance   . 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 CATARACT EXTRACTION Bilateral 10/16 & 11/16  . EXCISION METACARPAL MASS Right 11/16/2016   Procedure: RIGHT LITTLE FINGER CYST EXCISION;  Surgeon: Milly Jakob, MD;  Location: [facility];  Service: Orthopedics;  Laterality: Right;  . TONSILLECTOMY  age 50  . tooth extract     with bone graft  . Turbinate sinus surgery  2003  . VAGINAL HYSTERECTOMY  1998   secondary to prolapse, ovaries remain    Review of systems negative except as noted in HPI / PMHx or noted below:  Review of Systems  Constitutional: Negative.   HENT: Negative.   Eyes: Negative.   Respiratory: Negative.   Cardiovascular: Negative.   Gastrointestinal: Negative.   Genitourinary: Negative.   Musculoskeletal: Negative.   Skin: Negative.   Neurological: Negative.   Endo/Heme/Allergies: Negative.   Psychiatric/Behavioral: Negative.      Objective:   Vitals:   05/31/17 1150  BP: 136/74  Pulse:  88  Resp: 18          Physical Exam  Constitutional: She is well-developed, well-nourished, and in no distress.  Raspy voice, throat clearing, cough  HENT:  Head: Normocephalic.  Right Ear: Tympanic membrane, external ear and ear canal normal.  Left Ear: Tympanic membrane, external ear and ear canal normal.  Nose: Nose normal. No mucosal edema or rhinorrhea.  Mouth/Throat: Uvula is midline, oropharynx is clear and moist and mucous membranes are normal. No oropharyngeal exudate.  Eyes: Conjunctivae are normal.  Neck: Trachea normal. No tracheal tenderness present. No tracheal deviation present. No thyromegaly present.  Cardiovascular: Normal rate, regular rhythm, S1 normal, S2 normal and normal heart sounds.  No murmur heard. Pulmonary/Chest: Breath sounds normal. No stridor. No respiratory distress. She has no wheezes. She has no rales.  Musculoskeletal: She exhibits no edema.  Lymphadenopathy:       Head (right side): No tonsillar adenopathy present.       Head (left side): No tonsillar adenopathy present.    She has no cervical adenopathy.  Neurological: She is alert. Gait normal.  Skin: No rash noted. She is not diaphoretic. No erythema. Nails show no clubbing.   Psychiatric: Mood and affect normal.    Diagnostics:    Spirometry was performed and demonstrated an FEV1 of 1.67 at 87 % of predicted.  The patient had an Asthma Control Test with the following results: ACT Total Score: 13.    Assessment and Plan:   1. Asthma, well controlled, mild persistent   2. Other allergic rhinitis   3. LPRD (laryngopharyngeal reflux disease)   4. Chronic pansinusitis     1.  Change omeprazole to Dexilant 60 mg  in the morning and continue ranitidine 300mg  in the evening  2. Start Qvar Redihaler 80 - two inhalations two times per day  3. Use your Dymista one spray each nostril two times per day  4. Continue montelukast 10mg  one time per day  5. Antibiotic: clindamycin 150 three times a day for 10 days only  4. Continue ProAir HFA and Zyrtec and nasal saline as needed  5. Return to clinic in 1 months or earlier if problem  Christina Deleon unfortunately has a combination of respiratory tract inflammation and reflux induced respiratory disease that has not been under good control in the past year.  We will try the combination of therapy noted above to address these 2 issues.  In addition, she appears to have developed a prolonged episode of sinusitis with the development of persistent yellow nasal discharge and I will treat her with clindamycin to address this issue.  She has very bad reflux and it does appear to be affecting her respiratory tract and consideration for a possible surgical approach to this issue may need to be made if she does not respond adequately to a medical approach for reflux.  I will see her back in this clinic in 1 month or earlier if there is a problem.  Christina Katz, MD Allergy / Immunology [place]

## 2017-06-01 ENCOUNTER — Encounter: Payer: Self-pay | Admitting: Allergy and Immunology

## 2017-06-13 ENCOUNTER — Telehealth: Payer: Self-pay | Admitting: Allergy and Immunology

## 2017-06-13 ENCOUNTER — Other Ambulatory Visit: Payer: Self-pay

## 2017-06-13 DIAGNOSIS — J328 Other chronic sinusitis: Secondary | ICD-10-CM

## 2017-06-13 MED ORDER — MONTELUKAST SODIUM 10 MG PO TABS: 10.0000 mg | ORAL_TABLET | Freq: Every day | ORAL | 0 refills | Status: DC

## 2017-06-13 NOTE — Telephone Encounter (Signed)
Please have patient obtain a limited sinus CT scan so we can see exactly what is going on in her sinus cavities and make a more educated decision about how she should be treated.

## 2017-06-13 NOTE — Telephone Encounter (Signed)
Pt called and said that you put her on clindamycin and she stop it because it gave her headaches and was call out of town to take care of her mother and said that she needs another antibiotic because she could not take other one and she has a full blown sinus infection now. [facility] [address]. [phone].

## 2017-06-13 NOTE — Telephone Encounter (Signed)
Left voice message to call the office  

## 2017-06-14 ENCOUNTER — Encounter: Payer: Self-pay | Admitting: *Deleted

## 2017-06-15 ENCOUNTER — Other Ambulatory Visit: Payer: Self-pay

## 2017-06-15 DIAGNOSIS — J329 Chronic sinusitis, unspecified: Secondary | ICD-10-CM

## 2017-06-15 NOTE — Telephone Encounter (Signed)
No PA is needed per [facility] for CT of sinuses w/wo contrast. Order submitted for patient to have testing.

## 2017-06-15 NOTE — Telephone Encounter (Signed)
Patient informed. Need to check if insurance requires PA and then schedule appointment. [facility] staff please initiate PA.  Thank you.

## 2017-06-15 NOTE — Telephone Encounter (Signed)
VM left advising Pt that she will be getting a call from [facility] for CT scheduling.

## 2017-06-21 NOTE — Telephone Encounter (Signed)
Ct scan for sinus scheduled for 06/27/17 @ 3:30pm check in @ 3:15pm. Patient informed about time and location.

## 2017-06-27 ENCOUNTER — Ambulatory Visit (HOSPITAL_COMMUNITY)
Admission: RE | Admit: 2017-06-27 | Discharge: 2017-06-27 | Disposition: A | Discharge status: Home / Self Care | Payer: Medicare Other | Source: Ambulatory Visit | Attending: Allergy and Immunology | Admitting: Allergy and Immunology

## 2017-06-27 DIAGNOSIS — J329 Chronic sinusitis, unspecified: Secondary | ICD-10-CM | POA: Diagnosis not present

## 2017-06-27 DIAGNOSIS — J328 Other chronic sinusitis: Secondary | ICD-10-CM | POA: Diagnosis not present

## 2017-06-30 ENCOUNTER — Telehealth: Payer: Self-pay | Admitting: Rheumatology

## 2017-06-30 MED ORDER — ZOLPIDEM TARTRATE 5 MG PO TABS: ORAL_TABLET | ORAL | 1 refills | Status: DC

## 2017-06-30 NOTE — Telephone Encounter (Signed)
Last visit: 04/19/2017 Next visit: 10/20/2017  Last fill: 11/04/2016  Okay to refill Lorrin Mais?

## 2017-06-30 NOTE — Telephone Encounter (Signed)
Ok to refill 

## 2017-06-30 NOTE — Telephone Encounter (Signed)
Patient uses [facility] on [address]. Patient needs a refill on her Zolpidem. Patient states pharmacy faxed a refill request on  Friday 2/22, but has not received a response. Patient states this happens every time she needs a refill on any medication for Dr. Arlean Hopping office, and she should not have to go thru this every time. Patient was advised refill request was not in system that I could see, but a faxed copy may be pending. Patient was not satisfied with this explantation questioning why would that still be pending from Friday.  Patient advised request would be sent back, and requested to be looked at ASAP.

## 2017-07-01 NOTE — Telephone Encounter (Signed)
Patient called the office yesterday around 4:40 pm and I spoke to patient about refill. I had the prescription in my hand about to fax it when she called. The patient was very rude and was insisting that we should have gone ahead and refilled the med even without the refill request. I advised patient that we never received a request and that we could not fill a med without a request from the pharmacy or patient. I apologized to the patient for the inconvenience and the patient was still insistent that it was my fault and asked for my name. I provided the patient with my name, apologized and explained again that we could not refill the medication without a request. I faxed the prescription immediately after getting off of the phone with the patient and advised the patient to call with any future problems or concerns.

## 2017-07-04 DIAGNOSIS — I10 Essential (primary) hypertension: Secondary | ICD-10-CM | POA: Diagnosis not present

## 2017-07-04 DIAGNOSIS — F419 Anxiety disorder, unspecified: Secondary | ICD-10-CM | POA: Diagnosis not present

## 2017-07-04 DIAGNOSIS — Z6379 Other stressful life events affecting family and household: Secondary | ICD-10-CM | POA: Diagnosis not present

## 2017-07-05 ENCOUNTER — Ambulatory Visit (INDEPENDENT_AMBULATORY_CARE_PROVIDER_SITE_OTHER): Payer: Medicare Other | Admitting: Allergy and Immunology

## 2017-07-05 ENCOUNTER — Encounter: Payer: Self-pay | Admitting: Allergy and Immunology

## 2017-07-05 VITALS — BP 130/78 | HR 88 | Resp 16

## 2017-07-05 DIAGNOSIS — K219 Gastro-esophageal reflux disease without esophagitis: Secondary | ICD-10-CM | POA: Diagnosis not present

## 2017-07-05 DIAGNOSIS — J3089 Other allergic rhinitis: Secondary | ICD-10-CM | POA: Diagnosis not present

## 2017-07-05 DIAGNOSIS — J4530 Mild persistent asthma, uncomplicated: Secondary | ICD-10-CM

## 2017-07-05 NOTE — Progress Notes (Signed)
Follow-up Note  Referring Provider: Lanice Shirts, * Primary Provider: Lanice Shirts, MD Date of Office Visit: 07/05/2017  Subjective:   Christina Deleon (DOB: Feb 02, 1951) is a 67 y.o. female who returns to the [facility] [place] on 07/05/2017 in re-evaluation of the following:  HPI: Christina Deleon returns to this clinic in reevaluation of asthma and allergic rhinitis and LPR.  Her last visit to this clinic was 31 May 2017.  Overall she is much better at this point in time utilizing a large collection of medical therapy established during her last visit which included broad-spectrum antibiotics as well as aggressive therapy directed against reflux and inflammation of both her upper and lower airway.  At this point she has very little issues involving her nose although she still has a little bit of a low-grade headache in her frontal region just about every day. She does not have a significant problem with her throat at this point and her reflux is under excellent control. Rarely does she use a short acting bronchodilator  Allergies as of 07/05/2017      Reactions   Trazodone Hcl Other (See Comments)   REACTION: bad headaches   Penicillins Nausea Only, Rash   Patient has taken Amoxicillin without an issue, per patient. Has patient had a PCN reaction causing immediate rash, facial/tongue/throat swelling, SOB or lightheadedness with hypotension: No Has patient had a PCN reaction causing severe rash involving mucus membranes or skin necrosis: No Has patient had a PCN reaction that required hospitalization No Has patient had a PCN reaction occurring within the last 10 years: No If all of the above answers are "NO", then may proceed with Cephalosporin use.      Medication List      albuterol 108 (90 Base) MCG/ACT inhaler Commonly known as:  PROAIR HFA Inhale 2 puffs into the lungs every 6 (six) hours as needed for wheezing or shortness of breath.     Azelastine-Fluticasone 137-50 MCG/ACT Susp Place 1 spray into the nose daily.   beclomethasone 80 MCG/ACT inhaler Commonly known as:  QVAR REDIHALER Inhale 2 puffs into the lungs 2 (two) times daily.   betamethasone valerate ointment 0.1 % Commonly known as:  VALISONE Place a pea sized amount topically BID for one to weeks as needed. Not for daily long term use   CALCIUM PO Take 1 tablet by mouth daily.   candesartan 16 MG tablet Commonly known as:  ATACAND Take 8 mg by mouth at bedtime.   cetirizine 10 MG tablet Commonly known as:  ZYRTEC Take 1 tablet (10 mg total) by mouth daily. One tab daily for allergies   dexlansoprazole 60 MG capsule Commonly known as:  DEXILANT Take 1 capsule (60 mg total) by mouth daily.   methocarbamol 500 MG tablet Commonly known as:  ROBAXIN Take 1 tablet (500 mg total) by mouth daily as needed for muscle spasms.   montelukast 10 MG tablet Commonly known as:  SINGULAIR Take 1 tablet (10 mg total) by mouth at bedtime.   multivitamin tablet Take 1 tablet by mouth daily. Calcium, Vitamin B-12, Vitamin A, Vitamin C   nabumetone 500 MG tablet Commonly known as:  RELAFEN take 1 tablet by mouth twice a day   polyethylene glycol powder powder Commonly known as:  GLYCOLAX/MIRALAX Take 17 g by mouth daily.   pravastatin 20 MG tablet Commonly known as:  PRAVACHOL Take 20 mg by mouth daily.   ranitidine 300 MG tablet Commonly known as:  ZANTAC Take 1 tablet (300 mg total) by mouth at bedtime.   RESTASIS MULTIDOSE 0.05 % ophthalmic emulsion Generic drug:  cycloSPORINE   VITAMIN A PO Take by mouth daily.   VITAMIN B-12 PO Take 1 tablet by mouth daily.   VITAMIN C PO Take 1 tablet by mouth daily.   Vitamin D 2000 units tablet Take 2,000 Units by mouth daily.   zolmitriptan 2.5 MG disintegrating tablet Commonly known as:  ZOMIG ZMT Take 1 tablet (2.5 mg total) by mouth as needed for migraine.   zolpidem 5 MG tablet Commonly  known as:  AMBIEN take 1 to 2 tablets by mouth at bedtime if needed       Past Medical History:  Diagnosis Date  . Adenomatous colon polyp 10/1997, and 03/2009  . Allergic rhinitis   . Allergy   . Arthritis    osteo  . Asthma   . Chronic gastritis   . Fibromyalgia   . GERD (gastroesophageal reflux disease)   . Hemorrhoids   . Hyperlipidemia   . Hypertension   . Menorrhagia    partial hysterectomy  . Migraines   . PONV (postoperative nausea and vomiting)   . Sleep disturbance   . 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 CATARACT EXTRACTION Bilateral 10/16 & 11/16  . EXCISION METACARPAL MASS Right 11/16/2016   Procedure: RIGHT LITTLE FINGER CYST EXCISION;  Surgeon: Milly Jakob, MD;  Location: [facility];  Service: Orthopedics;  Laterality: Right;  . TONSILLECTOMY  age 75  . tooth extract     with bone graft  . Turbinate sinus surgery  2003  . VAGINAL HYSTERECTOMY  1998   secondary to prolapse, ovaries remain    Review of systems negative except as noted in HPI / PMHx or noted below:  Review of Systems  Constitutional: Negative.   HENT: Negative.   Eyes: Negative.   Respiratory: Negative.   Cardiovascular: Negative.   Gastrointestinal: Negative.   Genitourinary: Negative.   Musculoskeletal: Negative.   Skin: Negative.   Neurological: Negative.   Endo/Heme/Allergies: Negative.   Psychiatric/Behavioral: Negative.      Objective:   Vitals:   07/05/17 1043  BP: 130/78  Pulse: 88  Resp: 16          Physical Exam  Constitutional: She is well-developed, well-nourished, and in no distress.  HENT:  Head: Normocephalic.  Right Ear: Tympanic membrane, external ear and ear canal normal.  Left Ear: Tympanic membrane, external ear and ear canal normal.  Nose: Nose normal. No mucosal edema or rhinorrhea.  Mouth/Throat: Uvula is midline, oropharynx is  clear and moist and mucous membranes are normal. No oropharyngeal exudate.  Eyes: Conjunctivae are normal.  Neck: Trachea normal. No tracheal tenderness present. No tracheal deviation present. No thyromegaly present.  Cardiovascular: Normal rate, regular rhythm, S1 normal, S2 normal and normal heart sounds.  No murmur heard. Pulmonary/Chest: Breath sounds normal. No stridor. No respiratory distress. She has no wheezes. She has no rales.  Musculoskeletal: She exhibits no edema.  Lymphadenopathy:       Head (right side): No tonsillar adenopathy present.       Head (left side): No tonsillar adenopathy present.    She has no cervical adenopathy.  Neurological: She is alert. Gait normal.  Skin: No rash noted. She is not diaphoretic. No erythema. Nails show no clubbing.  Psychiatric: Mood and affect  normal.    Diagnostics:    Spirometry was performed and demonstrated an FEV1 of 1.62 at 82 % of predicted.  The patient had an Asthma Control Test with the following results: ACT Total Score: 18.    Results of a sinus CT scan obtained 27 June 2017 identified the following:  Sinuses: Paranasal sinuses clear. No mucosal thickening. No air-fluid levels. Ostiomeatal unit open.  Assessment and Plan:   1. Asthma, well controlled, mild persistent   2. Other allergic rhinitis   3. LPRD (laryngopharyngeal reflux disease)     1.  Continue Dexilant 60 mg  in the morning and ranitidine 300mg  in the evening  2. Continue Qvar Redihaler 80 - two inhalations two times per day  3. Continue Dymista one spray each nostril two times per day  4. Continue montelukast 10mg  one time per day  5. Continue ProAir HFA and Zyrtec and nasal saline as needed  6. Return to clinic in 6 month or earlier if problem  Overall Christina Deleon appears to be doing better and I would like for her to continue to utilize this plan noted above over the course of the next 6 months and if she has difficulty with her airway while  using a combination of anti-inflammatory agents for her respiratory tract and therapy directed against reflux she will contact me for further evaluation and treatment.  Otherwise I will see her back in this clinic in 6 months.  Allena Katz, MD [facility] [place]

## 2017-07-05 NOTE — Patient Instructions (Addendum)
  1.  Continue Dexilant 60 mg  in the morning and ranitidine 300mg  in the evening  2. Continue Qvar Redihaler 80 - two inhalations two times per day  3. Continue Dymista one spray each nostril two times per day  4. Continue montelukast 10mg  one time per day  5. Continue ProAir HFA and Zyrtec and nasal saline as needed  6. Return to clinic in 6 month or earlier if problem

## 2017-07-06 ENCOUNTER — Encounter: Payer: Self-pay | Admitting: Allergy and Immunology

## 2017-07-12 DIAGNOSIS — H04123 Dry eye syndrome of bilateral lacrimal glands: Secondary | ICD-10-CM | POA: Diagnosis not present

## 2017-07-12 DIAGNOSIS — M3501 Sicca syndrome with keratoconjunctivitis: Secondary | ICD-10-CM | POA: Diagnosis not present

## 2017-07-12 DIAGNOSIS — H40013 Open angle with borderline findings, low risk, bilateral: Secondary | ICD-10-CM | POA: Diagnosis not present

## 2017-07-19 ENCOUNTER — Other Ambulatory Visit: Payer: Self-pay

## 2017-07-19 ENCOUNTER — Telehealth: Payer: Self-pay | Admitting: Gastroenterology

## 2017-07-19 MED ORDER — MONTELUKAST SODIUM 10 MG PO TABS: ORAL_TABLET | ORAL | 1 refills | Status: DC

## 2017-07-19 NOTE — Telephone Encounter (Signed)
Informed patient she can take 2 Zantac 150 mg over the counter to equal 300 mg she takes at night. Patient states she does not want to do that. Patient states she is still having breakthrough symptoms even on the zantac. Informed patient that Dr. Lynne Leader last office note from 2017 states for her to follow up with further testing if still having symptoms. 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 Patient states she is scheduled for an appt with Dr. Fuller Plan in April. Rescheduled patient with Tye Savoy, PA on 07/21/17 at 2:30pm. Patient verbalized understanding.

## 2017-07-21 ENCOUNTER — Encounter: Payer: Self-pay | Admitting: Nurse Practitioner

## 2017-07-21 ENCOUNTER — Ambulatory Visit (INDEPENDENT_AMBULATORY_CARE_PROVIDER_SITE_OTHER): Payer: Medicare Other | Admitting: Nurse Practitioner

## 2017-07-21 VITALS — BP 130/70 | HR 84 | Ht 64.0 in | Wt 167.6 lb

## 2017-07-21 DIAGNOSIS — Z86010 Personal history of colonic polyps: Secondary | ICD-10-CM | POA: Diagnosis not present

## 2017-07-21 DIAGNOSIS — K219 Gastro-esophageal reflux disease without esophagitis: Secondary | ICD-10-CM | POA: Diagnosis not present

## 2017-07-21 MED ORDER — RANITIDINE HCL 300 MG PO TABS: 300.0000 mg | ORAL_TABLET | Freq: Every day | ORAL | 5 refills | Status: DC

## 2017-07-21 NOTE — Patient Instructions (Signed)
If you are age 68 or older, your body mass index should be between 23-30. Your Body mass index is 28.77 kg/m. If this is out of the aforementioned range listed, please consider follow up with your Primary Care Provider.  If you are age 30 or younger, your body mass index should be between 19-25. Your Body mass index is 28.77 kg/m. If this is out of the aformentioned range listed, please consider follow up with your Primary Care Provider.   We have sent the following medications to your pharmacy for you to pick up at your convenience: Zantac 300mg  at bedtime.  Thank you for choosing [facility]  Aline Brochure, NP

## 2017-07-21 NOTE — Progress Notes (Signed)
      IMPRESSION and PLAN:    #78.  67 year old female with chronic GERD.  We had her taking Omeprazole 40 mg in am and Zantac 300 mg q pm. Recently developed breakthrough GERD sx and Allergist changed to [place] which she takes every morning. She has remained on Zantac at bedtime.  This combination has worked well for her asthma and GERD symptoms but apparently insurance company not wanting to pay for Zantac. -Patient has repeatedly become symptomatic when she omits bedtime dose of Zantac.  We recommend continuation of this nighttime H2 blocker in addition to a.m. Dexilant. If Insurance denies then will may need to submit prior auth -continue anti-reflux measures as discussed today  #2.  History of adenomatous colon polyps.  Patient is due for surveillance colonoscopy November 2020.   -She is on the recall list and I told her to expect a letter      HPI:    Chief Complaint: GERD   Patient is a 67 year old female followed by Dr. Fuller Plan for history of adenomatous colon polyps and GERD.  For years patient was maintained on omeprazole and took 300 mg of Zantac at night.  Several months ago omeprazole seem to lose efficacy, patient's allergist started her on Dexilant in the morning and continued Zantac at bedtime.  Her GERD symptoms as well as asthma have since been well controlled.  Patient comes in with concern that insurance will no longer pay for the bedtime Zantac.  She is tried to wean herself from Zantac taking it every other day.  On the day she does not take the Zantac patient has nighttime heartburn and regurgitation.  Her coughing and wheezing recurs.  She has a wedge pillow which does help but does not alleviate the regurgitation.  She does not always go to bed on an empty stomach, we discussed this.  Review of systems:     No weight loss. No fevers. No urinary sx.  Past Medical History:  Diagnosis Date  . Adenomatous colon polyp 10/1997, and 03/2009  . Allergic rhinitis   . Allergy    . Arthritis    osteo  . Asthma   . Chronic gastritis   . Fibromyalgia   . GERD (gastroesophageal reflux disease)   . Hemorrhoids   . Hyperlipidemia   . Hypertension   . Menorrhagia    partial hysterectomy  . Migraines   . PONV (postoperative nausea and vomiting)   . Sleep disturbance   . 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 No obvious masses, no hepatomegaly,  normal bowel sounds SKIN:  turgor, no lesions seen Musculoskeletal:  Normal muscle tone, normal strength NEURO: Alert and oriented x 3, no focal neurologic deficits   Tye Savoy , NP 07/21/2017, 3:20 PM

## 2017-07-24 ENCOUNTER — Encounter: Payer: Self-pay | Admitting: Nurse Practitioner

## 2017-07-25 NOTE — Progress Notes (Signed)
Reviewed and agree with management plan.  Kassidie Hendriks T. Abriana Saltos, MD FACG 

## 2017-08-02 ENCOUNTER — Telehealth: Payer: Self-pay | Admitting: Nurse Practitioner

## 2017-08-05 NOTE — Telephone Encounter (Signed)
LMOM for patient to return call with information to contact insurance to get PA.  Contacted pharmacy they gave me recipient number but stated I would need to contact pharmacy for other information.

## 2017-08-08 ENCOUNTER — Telehealth: Payer: Self-pay

## 2017-08-08 NOTE — Telephone Encounter (Signed)
Prior authorization sent to [employer].  Ranitidine 300 mg is a plan exclusion.  Pt. Advised.  She wants to know of an alternative to try.  She states Prilosec does not work.  She wanted me to check with Dr. Fuller Plan.  Dr. Fuller Plan please advise.  Thank you, Peter Congo

## 2017-08-09 ENCOUNTER — Telehealth: Payer: Self-pay

## 2017-08-09 MED ORDER — FAMOTIDINE 40 MG PO TABS: 40.0000 mg | ORAL_TABLET | Freq: Every day | ORAL | 3 refills | Status: DC

## 2017-08-09 NOTE — Telephone Encounter (Signed)
Per Dr. Fuller Plan - try Famotidine 40 mg qhs.  Prescription sent electronically to pharmacy. Patient advised.

## 2017-08-09 NOTE — Telephone Encounter (Signed)
Famotidine 40 mg po hs If that is not covered then she can by Zantac 150 mg OTC and take 2 hs

## 2017-08-10 ENCOUNTER — Ambulatory Visit: Payer: Medicare Other | Admitting: Gastroenterology

## 2017-09-22 DIAGNOSIS — M3501 Sicca syndrome with keratoconjunctivitis: Secondary | ICD-10-CM | POA: Diagnosis not present

## 2017-09-22 DIAGNOSIS — H04123 Dry eye syndrome of bilateral lacrimal glands: Secondary | ICD-10-CM | POA: Diagnosis not present

## 2017-10-06 NOTE — Progress Notes (Signed)
Office Visit Note  Patient: Christina Deleon             Date of Birth: Mar 04, 1951           MRN: [id number]             PCP: Lanice Shirts, MD Referring: Lanice Shirts, * Visit Date: 10/20/2017 Occupation: @GUAROCC @    Subjective:  Generalized pain   History of Present Illness: Christina Deleon is a 67 y.o. female with history of fibromyalgia, osteoarthritis, and DDD.  She takes Ambien 5 mg 1 to 2 tablets by mouth at bedtime as needed for insomnia.  She also takes Relafen 500 mg by mouth twice daily.  She reports she has been having more frequent and severe fibromyalgia flares.  She states she has intermittent waxing and waning muscle aches and muscle tenderness.  She has been experiencing sharp pains in lower extremity muscles.  She has been taking Relafen for pain relief.  Her joint stiffness has been lasting all day.  She continues to have chronic lower back pain, and the pain is most severe if she is laying flat or walking for prolonged periods of time.  She denies any symptoms of sciatica.  She reports her neck pain has improved. She takes Robaxin for muscle spasms.  She has been having increased pain in bilateral hands and wrist joints.  She states over the last several months periodically she experiences pain in the right 3rd DIP joint.  She states the joint becomes very stiff and the right hand swells for several days and then resolves on its own.  The last recurrence of this was 6 weeks ago.  She has tried wearing a ring splint, which much relief.  She continues to have chronic fatigue and insomnia.  She has very interrupted sleep at night.  She takes Ambien 5 mg at bedtime.    Activities of Daily Living:  Patient reports morning stiffness all day.   Patient Reports nocturnal pain.  Difficulty dressing/grooming: Denies Difficulty climbing stairs: Reports Difficulty getting out of chair: Reports Difficulty using hands for taps, buttons, cutlery, and/or writing:  Reports   Review of Systems  Constitutional: Positive for fatigue.  HENT: Positive for mouth dryness. Negative for mouth sores and nose dryness.   Eyes: Positive for dryness. Negative for pain and visual disturbance.  Respiratory: Negative for cough, hemoptysis, shortness of breath and difficulty breathing.   Cardiovascular: Negative for chest pain, palpitations, hypertension and swelling in legs/feet.  Gastrointestinal: Positive for constipation. Negative for abdominal pain, blood in stool and diarrhea.  Endocrine: Negative for increased urination.  Genitourinary: Negative for painful urination and pelvic pain.  Musculoskeletal: Positive for arthralgias, joint pain, joint swelling and morning stiffness. Negative for myalgias, muscle weakness, muscle tenderness and myalgias.  Skin: Positive for rash. Negative for color change, pallor, hair loss, nodules/bumps, skin tightness, ulcers and sensitivity to sunlight.  Allergic/Immunologic: Negative for susceptible to infections.  Neurological: Positive for memory loss. Negative for dizziness, light-headedness, numbness, headaches and weakness.  Hematological: Negative for bruising/bleeding tendency and swollen glands.  Psychiatric/Behavioral: Negative for depressed mood and sleep disturbance. The patient is not nervous/anxious.     PMFS History:  Patient Active Problem List   Diagnosis Date Noted  . Fibromyalgia 04/19/2016  . Other fatigue 04/19/2016  . 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 Primary osteoarthritis of both knees 04/19/2016  . Leg pain 01/22/2016  . Restless legs syndrome 01/22/2016  . Snoring 11/19/2015  . 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 GERD (gastroesophageal reflux disease) 10/08/2015  . Mild persistent asthma 04/09/2015  . LPRD (laryngopharyngeal reflux disease) 04/09/2015  . Allergic rhinoconjunctivitis 04/09/2015  . HEMATOCHEZIA 04/01/2010  . COUGH 04/01/2010  . GASTRITIS 09/30/2009  . NAUSEA  09/30/2009  . ABDOMINAL PAIN-EPIGASTRIC 09/30/2009  . DYSTHYMIC DISORDER 09/26/2009  . PALPITATIONS 06/17/2009  . Impingement syndrome of shoulder, left 06/04/2009  . CONSTIPATION 02/05/2009  . PERSONAL HX COLONIC POLYPS 02/05/2009  . ASTHMA, PERSISTENT, MILD 02/04/2009  . CHEST PAIN, ATYPICAL 02/04/2009  . COLONIC POLYPS, BENIGN 12/18/2008  . HYPERLIPIDEMIA 12/18/2008  . Insomnia 12/18/2008  . MIGRAINE, COMMON 12/18/2008  . HYPERTENSION 12/18/2008  . ALLERGIC RHINITIS 12/18/2008  . Primary osteoarthritis of both hands 12/18/2008  . DJD (degenerative joint disease), cervical 12/18/2008  . NECK PAIN, CHRONIC 12/18/2008  . LOW BACK PAIN, CHRONIC 12/18/2008    Past Medical History:  Diagnosis Date  . Adenomatous colon polyp 10/1997, and 03/2009  . Allergic rhinitis   . Allergy   . Arthritis    osteo  . Asthma   . Chronic gastritis   . Fibromyalgia   . GERD (gastroesophageal reflux disease)   . Hemorrhoids   . Hyperlipidemia   . Hypertension   . Menorrhagia    partial hysterectomy  . Migraines   . PONV (postoperative nausea and vomiting)   . Sleep disturbance   . STD (sexually transmitted disease) 01/22/11 culture proven   HSV type I labia    Family History  Problem Relation Age of Onset  . Throat cancer Father   . Coronary artery disease Father   . Heart attack Father 1  . Stroke Father        during procedure  . Coronary artery disease Mother   . Hypertension Mother   . Hyperlipidemia Mother   . Pulmonary fibrosis Mother   . Lung disease Mother   . Prostate cancer Brother   . Heart disease Brother   . Diabetes Brother   . Diabetes Paternal Aunt        x 2  . Diabetes Paternal Grandmother   . Coronary artery disease Paternal Grandmother   . Stroke Maternal Grandmother        or MI  . Prostate cancer Paternal Uncle   . Migraines Daughter   . Migraines Daughter   . Colon cancer Neg Hx    Past Surgical History:  Procedure Laterality Date  . CATARACT  EXTRACTION Bilateral 10/16 & 11/16  . EXCISION METACARPAL MASS Right 11/16/2016   Procedure: RIGHT LITTLE FINGER CYST EXCISION;  Surgeon: Milly Jakob, MD;  Location: [facility];  Service: Orthopedics;  Laterality: Right;  . TONSILLECTOMY  age 49  . tooth extract     with bone graft  . Turbinate sinus surgery  2003  . VAGINAL HYSTERECTOMY  1998   secondary to prolapse, ovaries remain   Social History   Social History Narrative   She lives with husband.  They have 2 grown children.   She is retired Product/process development scientist.   Highest level of education:  2 years of college      Regular exercise, diet of fruits, veggies, limited fried foods, limited water      [facility]:   Originally from [place]. Has also lived in [place]. Previously worked doing Software engineer. No pets currently. No bird, mold, or hot tub exposure. Does have a musty smell in their home but it is new. Enjoys reading.  1 indoor plant. Has carpet in her home including in the bedroom. Also has draperies.      Objective: Vital Signs: BP 132/83 (BP Location: Left Arm, Patient Position: Sitting, Cuff Size: Normal)   Pulse 86   Resp 14   Ht 5\' 4"  (1.626 m)   Wt 168 lb (76.2 kg)   LMP 07/02/1994   BMI 28.84 kg/m    Physical Exam  Constitutional: She is oriented to person, place, and time. She appears well-developed and well-nourished.  HENT:  Head: Normocephalic and atraumatic.  Eyes: Conjunctivae and EOM are normal.  Neck: Normal range of motion.  Cardiovascular: Normal rate, regular rhythm, normal heart sounds and intact distal pulses.  Pulmonary/Chest: Effort normal and breath sounds normal.  Abdominal: Soft. Bowel sounds are normal.  Lymphadenopathy:    She has no cervical adenopathy.  Neurological: She is alert and oriented to person, place, and time.  Skin: Skin is warm and dry. Capillary refill takes less than 2 seconds.  Psychiatric: She has a normal mood and  affect. Her behavior is normal.  Nursing note and vitals reviewed.    Musculoskeletal Exam: C-spine limited ROM, especially with lateral rotation to the left.  Thoracic and lumbar spine good range of motion.  No midline spinal tenderness.  She has bilateral SI joint tenderness.  Shoulder joints full range of motion with discomfort of her left shoulder.  Elbow joints, wrist joints, MCPs, PIPs, DIPs good range of motion with no synovitis.  Hip joints, knee joints, ankle joints, MTPs, PIPs, and DIPs good ROM with no synovitis.  No warmth or effusion of knee joints. She has tenderness of bilateral trochanteric bursa.   CDAI Exam: No CDAI exam completed.    Investigation: No additional findings.   Imaging: Xr Hand 2 View Left  Result Date: 10/20/2017 PIP and DIP narrowing was noted.  No MCP changes were noted.  No intercarpal or radiocarpal joint space narrowing was noted.  Some cystic changes were noted in carpal bones.  No chondrocalcinosis was noted. Impression: These findings are consistent with osteoarthritis of the hand.  Xr Hand 2 View Right  Result Date: 10/20/2017 PIP and DIP narrowing was noted.  Calcification was noted in the right third PIP joint.  Erosive change was noted in the fourth DIP joint.  No MCP changes were noted.  No intercarpal or radiocarpal joint space narrowing was noted.  Some cystic changes were noted in carpal bones.  No chondrocalcinosis was noted. Impression: These findings are consistent with osteoarthritis of the hand.  This raises concern of possible crystal induced arthropathy.   Speciality Comments: No specialty comments available.    Procedures:  No procedures performed Allergies: Trazodone hcl and Penicillins   Assessment / Plan:     Visit Diagnoses: Fibromyalgia: Her fibromyalgia has been flaring more frequently due to increased stress.  She has generalized muscle aches muscle tenderness especially in her lower extremities.  She has been experience  joint pains in her muscles of her lower extremities.  She continues to take Relafen 500 mg twice daily for pain relief.  A refill of Relafen was sent to the pharmacy today.  We will check CBC and CMP to monitor for drug toxicity.  She has been having worsening insomnia and fatigue.  She takes Ambien 5 mg at bedtime which helps with her insomnia.  She continues to have interrupted sleep at night.  Good sleep hygiene was discussed.  We discussed the importance of trying to exercise on a regular  basis.  Other insomnia -she continues to have interrupted sleep at night.  She takes Ambien 5 mg at bedtime which helps with insomnia.  A refill of Ambien was sent to the pharmacy today.  Other fatigue: Chronic and worsening.  Her fatigue seems to be related to insomnia.  She is advised to continue to exercise on a regular basis.  Primary osteoarthritis of both hands: She has PIP and DIP synovial thickening consistent with osteoarthritis of bilateral hands.  She has no synovitis on exam.  She is been having increased discomfort in bilateral hands.  X-rays of her hands were obtained today.  He also check CCP, RF, and uric acid.  Primary osteoarthritis of both knees: No warmth or effusion was noted.  She has good range of motion.  She has been having increased discomfort in bilateral knee joints.  Impingement syndrome of shoulder, left: She has full range of motion on exam with some discomfort.  She continues to work on range of motion of her left shoulder.  Night.  Plantar fasciitis, right - Improved.  She has still has tenderness along the plantar fascia.  She performs exercises on a regular basis. PT helped in the past.  DDD (degenerative disc disease), cervical: Chronic pain. She has limited range of motion of her C-spine especially with lateral rotation to the left.  She is encouraged to continue to work on range of motion exercises of her neck.  DDD lumbar spine: No midline spinal tenderness.  She has  discomfort with range of motion of her lumbar spine.  Medication monitoring encounter -CBC and CMP will be ordered today to monitor for drug toxicity.  A refill of Relafen was sent to the pharmacy.  Plan: COMPLETE METABOLIC PANEL WITH GFR, CBC with Differential/Platelet  Pain in both hands -She is been having increased pain in bilateral hands.  She has a history of intermittent pain and swelling in her right hand.  She has no synovitis on exam today.  We will check CCP, RF, and uric acid today.  X-rays of bilateral hands were also obtained.  She was given a handout of hand exercises that she can perform at home.  Plan: Cyclic citrul peptide antibody, IgG, Rheumatoid factor, Uric acid, XR Hand 2 View Left, XR Hand 2 View Right   Other medical conditions are listed as follows:  History of migraine  History of asthma  History of hypertension  History of hyperlipidemia  History of colon polyps  History of gastroesophageal reflux (GERD)    Orders: Orders Placed This Encounter  Procedures  . XR Hand 2 View Left  . XR Hand 2 View Right  . COMPLETE METABOLIC PANEL WITH GFR  . CBC with Differential/Platelet  . Cyclic citrul peptide antibody, IgG  . Rheumatoid factor  . Uric acid  . Iron, TIBC and Ferritin Panel  . Magnesium   Meds ordered this encounter  Medications  . nabumetone (RELAFEN) 500 MG tablet    Sig: Take 1 tablet (500 mg total) by mouth 2 (two) times daily.    Dispense:  60 tablet    Refill:  1    Face-to-face time spent with patient was 30 minutes. >50% of time was spent in counseling and coordination of care.  Follow-Up Instructions: Return in about 6 months (around 04/21/2018) for Fibromyalgia, Osteoarthritis, DDD.   Hazel Sams PA-C  I examined and evaluated the patient with Hazel Sams PA.  She has been having discomfort in her hands.  She has some tenderness  on palpation over her right third PIP joint.  The x-ray of bilateral hands today were consistent with  osteoarthritis.  She had some calcification in the right third PIP joint which raises concern of crystal induced arthropathy.  We will obtain labs as mentioned above.  The plan of care was discussed as noted above.   Bo Merino, MD  Note - This record has been created using Editor, commissioning.  Chart creation errors have been sought, but may not always  have been located. Such creation errors do not reflect on  the standard of medical care.

## 2017-10-10 ENCOUNTER — Other Ambulatory Visit: Payer: Self-pay | Admitting: Allergy and Immunology

## 2017-10-19 ENCOUNTER — Other Ambulatory Visit: Payer: Self-pay | Admitting: Physician Assistant

## 2017-10-20 ENCOUNTER — Ambulatory Visit (INDEPENDENT_AMBULATORY_CARE_PROVIDER_SITE_OTHER): Payer: Medicare Other

## 2017-10-20 ENCOUNTER — Encounter: Payer: Self-pay | Admitting: Rheumatology

## 2017-10-20 ENCOUNTER — Ambulatory Visit (INDEPENDENT_AMBULATORY_CARE_PROVIDER_SITE_OTHER): Payer: Self-pay

## 2017-10-20 ENCOUNTER — Ambulatory Visit (INDEPENDENT_AMBULATORY_CARE_PROVIDER_SITE_OTHER): Payer: Medicare Other | Admitting: Rheumatology

## 2017-10-20 VITALS — BP 132/83 | HR 86 | Resp 14 | Ht 64.0 in | Wt 168.0 lb

## 2017-10-20 DIAGNOSIS — R5383 Other fatigue: Secondary | ICD-10-CM | POA: Diagnosis not present

## 2017-10-20 DIAGNOSIS — M722 Plantar fascial fibromatosis: Secondary | ICD-10-CM

## 2017-10-20 DIAGNOSIS — Z86010 Personal history of colonic polyps: Secondary | ICD-10-CM

## 2017-10-20 DIAGNOSIS — Z8679 Personal history of other diseases of the circulatory system: Secondary | ICD-10-CM

## 2017-10-20 DIAGNOSIS — Z8709 Personal history of other diseases of the respiratory system: Secondary | ICD-10-CM

## 2017-10-20 DIAGNOSIS — M19042 Primary osteoarthritis, left hand: Secondary | ICD-10-CM

## 2017-10-20 DIAGNOSIS — M19041 Primary osteoarthritis, right hand: Secondary | ICD-10-CM

## 2017-10-20 DIAGNOSIS — M79641 Pain in right hand: Secondary | ICD-10-CM

## 2017-10-20 DIAGNOSIS — M47816 Spondylosis without myelopathy or radiculopathy, lumbar region: Secondary | ICD-10-CM | POA: Diagnosis not present

## 2017-10-20 DIAGNOSIS — Z5181 Encounter for therapeutic drug level monitoring: Secondary | ICD-10-CM

## 2017-10-20 DIAGNOSIS — M79642 Pain in left hand: Secondary | ICD-10-CM | POA: Diagnosis not present

## 2017-10-20 DIAGNOSIS — M7542 Impingement syndrome of left shoulder: Secondary | ICD-10-CM

## 2017-10-20 DIAGNOSIS — Z8669 Personal history of other diseases of the nervous system and sense organs: Secondary | ICD-10-CM | POA: Diagnosis not present

## 2017-10-20 DIAGNOSIS — M797 Fibromyalgia: Secondary | ICD-10-CM

## 2017-10-20 DIAGNOSIS — Z8639 Personal history of other endocrine, nutritional and metabolic disease: Secondary | ICD-10-CM

## 2017-10-20 DIAGNOSIS — M5030 Other cervical disc degeneration, unspecified cervical region: Secondary | ICD-10-CM | POA: Diagnosis not present

## 2017-10-20 DIAGNOSIS — Z8719 Personal history of other diseases of the digestive system: Secondary | ICD-10-CM

## 2017-10-20 DIAGNOSIS — M170 Bilateral primary osteoarthritis of knee: Secondary | ICD-10-CM | POA: Diagnosis not present

## 2017-10-20 DIAGNOSIS — G4709 Other insomnia: Secondary | ICD-10-CM | POA: Diagnosis not present

## 2017-10-20 LAB — COMPLETE METABOLIC PANEL WITH GFR
AG RATIO: 1.7 (calc) (ref 1.0–2.5)
ALBUMIN MSPROF: 4.2 g/dL (ref 3.6–5.1)
ALKALINE PHOSPHATASE (APISO): 71 U/L (ref 33–130)
ALT: 9 U/L (ref 6–29)
AST: 14 U/L (ref 10–35)
BILIRUBIN TOTAL: 0.4 mg/dL (ref 0.2–1.2)
BUN: 15 mg/dL (ref 7–25)
CO2: 29 mmol/L (ref 20–32)
Calcium: 9.7 mg/dL (ref 8.6–10.4)
Chloride: 106 mmol/L (ref 98–110)
Creat: 0.9 mg/dL (ref 0.50–0.99)
GFR, EST AFRICAN AMERICAN: 77 mL/min/{1.73_m2} (ref >=60)
GFR, Est Non African American: 66 mL/min/{1.73_m2} (ref >=60)
Globulin: 2.5 g/dL (calc) (ref 1.9–3.7)
Glucose, Bld: 84 mg/dL (ref 65–99)
POTASSIUM: 4.5 mmol/L (ref 3.5–5.3)
SODIUM: 142 mmol/L (ref 135–146)
TOTAL PROTEIN: 6.7 g/dL (ref 6.1–8.1)

## 2017-10-20 LAB — CBC WITH DIFFERENTIAL/PLATELET
BASOS ABS: 29 {cells}/uL (ref 0–200)
Basophils Relative: 0.7 %
EOS PCT: 3.3 %
Eosinophils Absolute: 139 cells/uL (ref 15–500)
HEMATOCRIT: 39.4 % (ref 35.0–45.0)
Hemoglobin: 13.5 g/dL (ref 11.7–15.5)
LYMPHS ABS: 1369 {cells}/uL (ref 850–3900)
MCH: 29.8 pg (ref 27.0–33.0)
MCHC: 34.3 g/dL (ref 32.0–36.0)
MCV: 87 fL (ref 80.0–100.0)
MONOS PCT: 8.5 %
MPV: 9.2 fL (ref 7.5–12.5)
Neutro Abs: 2306 cells/uL (ref 1500–7800)
Neutrophils Relative %: 54.9 %
PLATELETS: 349 10*3/uL (ref 140–400)
RBC: 4.53 10*6/uL (ref 3.80–5.10)
RDW: 12.6 % (ref 11.0–15.0)
TOTAL LYMPHOCYTE: 32.6 %
WBC mixed population: 357 cells/uL (ref 200–950)
WBC: 4.2 10*3/uL (ref 3.8–10.8)

## 2017-10-20 MED ORDER — NABUMETONE 500 MG PO TABS: 500.0000 mg | ORAL_TABLET | Freq: Two times a day (BID) | ORAL | 1 refills | Status: DC

## 2017-10-20 NOTE — Telephone Encounter (Signed)
Last Visit: 04/19/17 Next Visit: 10/20/17  Okay to refill Ambien?

## 2017-10-20 NOTE — Patient Instructions (Signed)

## 2017-10-21 LAB — IRON,TIBC AND FERRITIN PANEL
%SAT: 24 % (calc) (ref 16–45)
FERRITIN: 32 ng/mL (ref 16–288)
IRON: 75 ug/dL (ref 45–160)
TIBC: 309 mcg/dL (calc) (ref 250–450)

## 2017-10-21 LAB — URIC ACID: URIC ACID, SERUM: 6.6 mg/dL (ref 2.5–7.0)

## 2017-10-21 LAB — RHEUMATOID FACTOR: Rhuematoid fact SerPl-aCnc: <14 IU/mL (ref <14)

## 2017-10-21 LAB — MAGNESIUM: MAGNESIUM: 2.3 mg/dL (ref 1.5–2.5)

## 2017-10-21 LAB — CYCLIC CITRUL PEPTIDE ANTIBODY, IGG: Cyclic Citrullin Peptide Ab: <16 UNITS

## 2017-10-21 NOTE — Progress Notes (Signed)
WNLs

## 2017-10-28 ENCOUNTER — Telehealth: Payer: Self-pay | Admitting: Rheumatology

## 2017-10-28 MED ORDER — METHOCARBAMOL 500 MG PO TABS: 500.0000 mg | ORAL_TABLET | Freq: Every day | ORAL | 0 refills | Status: DC | PRN

## 2017-10-28 NOTE — Telephone Encounter (Signed)
Last Visit: 10/20/17 Next Visit: 04/17/18  Okay to refill per Dr. Estanislado Pandy

## 2017-10-28 NOTE — Telephone Encounter (Signed)
Patient left a voicemail stating she picked up the Nabumetone and zolpidem, but she is still waiting for the Robaxin to be sent in.  Patient's pharmacy is [facility] on [address].

## 2017-11-28 ENCOUNTER — Encounter: Payer: Self-pay | Admitting: Neurology

## 2017-11-28 ENCOUNTER — Ambulatory Visit (INDEPENDENT_AMBULATORY_CARE_PROVIDER_SITE_OTHER): Payer: Medicare Other | Admitting: Neurology

## 2017-11-28 VITALS — BP 124/74 | HR 87 | Ht 64.0 in | Wt 166.0 lb

## 2017-11-28 DIAGNOSIS — G4761 Periodic limb movement disorder: Secondary | ICD-10-CM | POA: Diagnosis not present

## 2017-11-28 DIAGNOSIS — G43101 Migraine with aura, not intractable, with status migrainosus: Secondary | ICD-10-CM

## 2017-11-28 MED ORDER — ZOLMITRIPTAN 5 MG PO TBDP: 5.0000 mg | ORAL_TABLET | ORAL | 11 refills | Status: DC | PRN

## 2017-11-28 NOTE — Patient Instructions (Signed)
Increase Zomig to 5mg    Return to clinic 1 year

## 2017-11-28 NOTE — Progress Notes (Signed)
Follow-up Visit   Date: 11/28/17    OLUWATOMISIN Deleon MRN: [id number] DOB: 11-26-1950   Interim History: Christina Deleon is a 67 y.o. right-handed African American female with history of GERD, hypertension, hyperlipidemia, fibromyalgia, and migraines returning to the clinic for follow-up of and periodic limb movements and migraines.  History of present illness: Starting around 2005, her husband started noticing right leg movements especially at night. She lays still about 20-30 seconds followed by episodic jerking of the legs, especially the right side. This occurs only at nighttime for the first few hours of sleep. She takes [place] and about 40 minutes later, the movements start. She had a sleep study and was told that she did have "movement of the legs". She endorses vivid dreams. No history of sleep walking. She has no taken any medications.   She has a history of migraines starting in her teens. Pain is usually on the right side, described as throbbing/sharp pain. She has associated nausea, photosensitivity, and phonophobia. Duration is 4 hr, if she takes zomig, but they typically will occur for the next 3-4 days. She takes zomig which has controlled her headaches. She was started on topamax, but does not take it daily because of side effects.   She complains of one-month history of dizziness, described as lightheadedness. She feels nauseous when she moves her head too quickly. She denies any double vision. It is worse when she bends down to pick up something off the floor. No ear pain or tinnitus.  Dizziness resolved with hydration.  UPDATE 11/28/2017:  She is here for 1 year follow-up visit.  She continues to have headaches, which occur about one week of the month.  Headaches are daily for about a week but is not constant during the entire week. Migraine is responds to Zomig 2.44m, but does not completely alleviate her pain.  Her leg movements are stable, no worsening. No interval falls,  illnesses, or hospitalizations.    Medications:  Current Outpatient Medications on File Prior to Visit  Medication Sig Dispense Refill  . albuterol (PROAIR HFA) 108 (90 BASE) MCG/ACT inhaler Inhale 2 puffs into the lungs every 6 (six) hours as needed for wheezing or shortness of breath. 1 Inhaler 1  . Ascorbic Acid (VITAMIN C PO) Take 1 tablet by mouth daily.    . Azelastine-Fluticasone 137-50 MCG/ACT SUSP Place 1 spray into the nose daily. 23 g 11  . beclomethasone (QVAR REDIHALER) 80 MCG/ACT inhaler Inhale 2 puffs into the lungs 2 (two) times daily. 1 Inhaler 3  . betamethasone valerate ointment (VALISONE) 0.1 % Place a pea sized amount topically BID for one to weeks as needed. Not for daily long term use 30 g 0  . CALCIUM PO Take 1 tablet by mouth daily.    . candesartan (ATACAND) 16 MG tablet Take 8 mg by mouth at bedtime.     . cetirizine (ZYRTEC) 10 MG tablet Take 1 tablet (10 mg total) by mouth daily. One tab daily for allergies 30 tablet 1  . Cholecalciferol (VITAMIN D) 2000 units tablet Take 2,000 Units by mouth daily.    . Cyanocobalamin (VITAMIN B-12 PO) Take 1 tablet by mouth daily.    .Marland Kitchendexlansoprazole (DEXILANT) 60 MG capsule Take 1 capsule (60 mg total) by mouth daily. 30 capsule 5  . famotidine (PEPCID) 40 MG tablet Take 1 tablet (40 mg total) by mouth at bedtime. 30 tablet 3  . fluorometholone (FML) 0.1 % ophthalmic ointment Place 1 application  into both eyes as needed.    Marland Kitchen Lifitegrast (XIIDRA) 5 % SOLN Apply 1 drop to eye 2 (two) times daily.    . methocarbamol (ROBAXIN) 500 MG tablet Take 1 tablet (500 mg total) by mouth daily as needed for muscle spasms. 90 tablet 0  . montelukast (SINGULAIR) 10 MG tablet TAKE 1 TABLET BY MOUTH ONCE DAILY 90 tablet 0  . Multiple Vitamin (MULTIVITAMIN) tablet Take 1 tablet by mouth daily. Calcium, Vitamin B-12, Vitamin A, Vitamin C    . nabumetone (RELAFEN) 500 MG tablet Take 1 tablet (500 mg total) by mouth 2 (two) times daily. 60 tablet 1    . polyethylene glycol powder (GLYCOLAX/MIRALAX) powder Take 17 g by mouth daily. (Patient taking differently: Take 17 g by mouth daily as needed. ) 850 g 1  . pravastatin (PRAVACHOL) 20 MG tablet Take 20 mg by mouth daily.      . ranitidine (ZANTAC) 300 MG tablet Take 1 tablet (300 mg total) by mouth at bedtime. 30 tablet 5  . VITAMIN A PO Take by mouth daily.    Marland Kitchen zolpidem (AMBIEN) 5 MG tablet TAKE 1 TO 2 TABLETS BY MOUTH AT BEDTIME AS NEEDED 60 tablet 0   No current facility-administered medications on file prior to visit.     Allergies:  Allergies  Allergen Reactions  . Trazodone Hcl Other (See Comments)    REACTION: bad headaches  . Penicillins Nausea Only and Rash    Patient has taken Amoxicillin without an issue, per patient. Has patient had a PCN reaction causing immediate rash, facial/tongue/throat swelling, SOB or lightheadedness with hypotension: No Has patient had a PCN reaction causing severe rash involving mucus membranes or skin necrosis: No Has patient had a PCN reaction that required hospitalization No Has patient had a PCN reaction occurring within the last 10 years: No If all of the above answers are "NO", then may proceed with Cephalosporin use.      Review of Systems:  CONSTITUTIONAL: No fevers, chills, night sweats, or weight loss.   EYES: No visual changes or eye pain ENT: No hearing changes.  No history of nose bleeds.   RESPIRATORY: No cough, wheezing and shortness of breath.   CARDIOVASCULAR: Negative for chest pain, and palpitations.   GI: Negative for abdominal discomfort, blood in stools or black stools.  No recent change in bowel habits.   GU:  No history of incontinence.   MUSCLOSKELETAL: +history of joint pain or swelling.  +myalgias.   SKIN: Negative for lesions, rash, and itching.   ENDOCRINE: Negative for cold or heat intolerance, polydipsia or goiter.   PSYCH:  No depression or anxiety symptoms.   NEURO: As Above.   Vital Signs:  BP 124/74    Pulse 87   Ht _0  (1.626 m)   Wt 166 lb (75.3 kg)   LMP 07/02/1994   SpO2 98%   BMI 28.49 kg/m   General Medical Exam:   General:  Well appearing, comfortable  Eyes/ENT: see cranial nerve examination.   Neck: No masses appreciated.  Full range of motion without tenderness.  No carotid bruits. Respiratory:  Clear to auscultation, good air entry bilaterally.   Cardiac:  Regular rate and rhythm, no murmur.   Ext:  No edema  Neurological Exam: MENTAL STATUS including orientation to time, place, person is normal.  Speech is not dysarthric.  CRANIAL NERVES:  Pupils equal round and reactive to light. Face is symmetric.   MOTOR:  Motor strength is 5/5  in all extremities.    COORDINATION/GAIT:  Gait narrow based and stable.  Data: Labs 11/06/2013:  ESR 15, vitamin B12 241, TSH 1.9, ferritin 37  EEG 11/14/2013: This awake and asleep EEG is within normal limits.  MRI cervical spine wo contrast 05/14/2008: Cervical disc degeneration and spondylosis as above. No change from 2005.   MRI brain wo contrast 03/17/2004:  Unremarkable MRI of the brain. No acute findings. benign arachnoid granulations (giant pacchionian granulations), and I feel that this finding represents a normal anatomic variant as opposed to pathology. At this point, I cannot make the diagnosis of a pathologic lesion such as an eosinophilic granuloma. Again I favor that this represents a benign process, i.e. giant arachnoid granulations.   MRI/A brain and MRA neck 12/05/2013: 1. Stable and negative MRI appearance of the brain; inadvertently  diffusion-weighted imaging was not performed. Attempts will be made to brain the patient back for diffusion weighted imaging, and an addendum will be dictated.  2. Negative MRA head and neck.   Lab Results  Component Value Date   VITAMINB12 216 11/25/2016    IMPRESSION/PLAN: 1. Episodic migraine, lasting about one week per month  - Increase Zomig to 67m as needed for migraine  2.   Periodic limb movements of sleep, stable   - Encouraged to be compliant with iron supplements 3235mdaily  - Robaxin [id number] cramps, which is prescribed by rheumatology  Return to clinic in 1 year.   Thank you for allowing me to participate in patient's care.  If I can answer any additional questions, I would be pleased to do so.    Sincerely,    Jerell Demery K. PatPosey ProntoO

## 2017-12-02 DIAGNOSIS — H40013 Open angle with borderline findings, low risk, bilateral: Secondary | ICD-10-CM | POA: Diagnosis not present

## 2017-12-02 DIAGNOSIS — H04123 Dry eye syndrome of bilateral lacrimal glands: Secondary | ICD-10-CM | POA: Diagnosis not present

## 2017-12-02 DIAGNOSIS — Z961 Presence of intraocular lens: Secondary | ICD-10-CM | POA: Diagnosis not present

## 2017-12-02 DIAGNOSIS — M3501 Sicca syndrome with keratoconjunctivitis: Secondary | ICD-10-CM | POA: Diagnosis not present

## 2017-12-06 ENCOUNTER — Encounter: Payer: Self-pay | Admitting: Allergy and Immunology

## 2017-12-06 ENCOUNTER — Ambulatory Visit (INDEPENDENT_AMBULATORY_CARE_PROVIDER_SITE_OTHER): Payer: Medicare Other | Admitting: Allergy and Immunology

## 2017-12-06 VITALS — BP 122/84 | HR 84 | Ht 64.0 in | Wt 167.8 lb

## 2017-12-06 DIAGNOSIS — J3089 Other allergic rhinitis: Secondary | ICD-10-CM | POA: Diagnosis not present

## 2017-12-06 DIAGNOSIS — M797 Fibromyalgia: Secondary | ICD-10-CM | POA: Diagnosis not present

## 2017-12-06 DIAGNOSIS — J4530 Mild persistent asthma, uncomplicated: Secondary | ICD-10-CM

## 2017-12-06 DIAGNOSIS — K219 Gastro-esophageal reflux disease without esophagitis: Secondary | ICD-10-CM

## 2017-12-06 MED ORDER — BECLOMETHASONE DIPROP HFA 80 MCG/ACT IN AERB: 2.0000 | INHALATION_SPRAY | Freq: Two times a day (BID) | RESPIRATORY_TRACT | 3 refills | Status: DC

## 2017-12-06 MED ORDER — MONTELUKAST SODIUM 10 MG PO TABS: ORAL_TABLET | ORAL | 0 refills | Status: DC

## 2017-12-06 MED ORDER — AZELASTINE-FLUTICASONE 137-50 MCG/ACT NA SUSP: 1.0000 | Freq: Every day | NASAL | 5 refills | Status: DC

## 2017-12-06 MED ORDER — PANTOPRAZOLE SODIUM 40 MG PO TBEC: 40.0000 mg | DELAYED_RELEASE_TABLET | Freq: Every day | ORAL | 5 refills | Status: DC

## 2017-12-06 MED ORDER — ALBUTEROL SULFATE HFA 108 (90 BASE) MCG/ACT IN AERS: 2.0000 | INHALATION_SPRAY | Freq: Four times a day (QID) | RESPIRATORY_TRACT | 1 refills | Status: DC | PRN

## 2017-12-06 NOTE — Progress Notes (Signed)
Follow-up Note  Referring Provider: Lanice Shirts, * Primary Provider: Lanice Shirts, MD Date of Office Visit: 12/06/2017  Subjective:   Christina Deleon (DOB: 04-24-1951) is a 67 y.o. female who returns to the [facility] [place] on 12/06/2017 in re-evaluation of the following:  HPI: Corbin returns to this clinic in reevaluation of her asthma and allergic rhinitis and LPR.  Her last visit to this clinic was 05 July 2017.  Overall she has done relatively well with her asthma and her requirement for short acting bronchodilator is approximately 2 times a week or less.  She continues to use Qvar on a regular basis.  She cannot really exercise because of her fibromyalgia.  She has tried exercise in the past but she gets really sore and the soreness just never resolves.  She has not required a systemic steroid since I have seen her in this clinic to treat an exacerbation of asthma.  She still does have the sensation of occasional chest tightness that does not appear to respond to the use of a short acting bronchodilator and is not associated with any additional respiratory or chest symptoms.  This does not appear to be exertional in nature.  Her nose is really doing relatively well at this point in time while using a combination nasal steroid and nasal antihistamine and a leukotriene modifier.  She has not required an antibiotic to treat an episode of sinusitis.  She believes that her reflux is doing very well at this point time just on a proton pump inhibitor as she no longer uses her H2 receptor blocker.  Dexilant is a little bit too expensive and she would like a substitute for this medication.  Allergies as of 12/06/2017      Reactions   Trazodone Hcl Other (See Comments)   REACTION: bad headaches   Penicillins Nausea Only, Rash   Patient has taken Amoxicillin without an issue, per patient. Has patient had a PCN reaction causing immediate rash, facial/tongue/throat  swelling, SOB or lightheadedness with hypotension: No Has patient had a PCN reaction causing severe rash involving mucus membranes or skin necrosis: No Has patient had a PCN reaction that required hospitalization No Has patient had a PCN reaction occurring within the last 10 years: No If all of the above answers are "NO", then may proceed with Cephalosporin use.      Medication List      albuterol 108 (90 Base) MCG/ACT inhaler Commonly known as:  PROAIR HFA Inhale 2 puffs into the lungs every 6 (six) hours as needed for wheezing or shortness of breath.   Azelastine-Fluticasone 137-50 MCG/ACT Susp Place 1 spray into the nose daily.   beclomethasone 80 MCG/ACT inhaler Commonly known as:  QVAR REDIHALER Inhale 2 puffs into the lungs 2 (two) times daily.   betamethasone valerate ointment 0.1 % Commonly known as:  VALISONE Place a pea sized amount topically BID for one to weeks as needed. Not for daily long term use   CALCIUM PO Take 1 tablet by mouth daily.   candesartan 16 MG tablet Commonly known as:  ATACAND Take 8 mg by mouth at bedtime.   cetirizine 10 MG tablet Commonly known as:  ZYRTEC Take 1 tablet (10 mg total) by mouth daily. One tab daily for allergies   dexlansoprazole 60 MG capsule Commonly known as:  DEXILANT Take 1 capsule (60 mg total) by mouth daily.   methocarbamol 500 MG tablet Commonly known as:  ROBAXIN Take  1 tablet (500 mg total) by mouth daily as needed for muscle spasms.   montelukast 10 MG tablet Commonly known as:  SINGULAIR TAKE 1 TABLET BY MOUTH ONCE DAILY   multivitamin tablet Take 1 tablet by mouth daily. Calcium, Vitamin B-12, Vitamin A, Vitamin C   nabumetone 500 MG tablet Commonly known as:  RELAFEN Take 1 tablet (500 mg total) by mouth 2 (two) times daily.   polyethylene glycol powder powder Commonly known as:  GLYCOLAX/MIRALAX Take 17 g by mouth daily.   pravastatin 20 MG tablet Commonly known as:  PRAVACHOL Take 20 mg by  mouth daily.   VITAMIN A PO Take by mouth daily.   VITAMIN B-12 PO Take 1 tablet by mouth daily.   VITAMIN C PO Take 1 tablet by mouth daily.   Vitamin D 2000 units tablet Take 2,000 Units by mouth daily.   XIIDRA 5 % Soln Generic drug:  Lifitegrast Apply 1 drop to eye 2 (two) times daily.   zolmitriptan 5 MG disintegrating tablet Commonly known as:  ZOMIG ZMT Take 1 tablet (5 mg total) by mouth as needed for migraine.   zolpidem 5 MG tablet Commonly known as:  AMBIEN TAKE 1 TO 2 TABLETS BY MOUTH AT BEDTIME AS NEEDED       Past Medical History:  Diagnosis Date  . Adenomatous colon polyp 10/1997, and 03/2009  . Allergic rhinitis   . Allergy   . Arthritis    osteo  . Asthma   . Chronic gastritis   . Fibromyalgia   . GERD (gastroesophageal reflux disease)   . Hemorrhoids   . Hyperlipidemia   . Hypertension   . Menorrhagia    partial hysterectomy  . Migraines   . PONV (postoperative nausea and vomiting)   . Sleep disturbance   . 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 CATARACT EXTRACTION Bilateral 10/16 & 11/16  . EXCISION METACARPAL MASS Right 11/16/2016   Procedure: RIGHT LITTLE FINGER CYST EXCISION;  Surgeon: Milly Jakob, MD;  Location: [place];  Service: Orthopedics;  Laterality: Right;  . TONSILLECTOMY  age 43  . tooth extract     with bone graft  . Turbinate sinus surgery  2003  . VAGINAL HYSTERECTOMY  1998   secondary to prolapse, ovaries remain    Review of systems negative except as noted in HPI / PMHx or noted below:  Review of Systems  Constitutional: Negative.   HENT: Negative.   Eyes: Negative.   Respiratory: Negative.   Cardiovascular: Negative.   Gastrointestinal: Negative.   Genitourinary: Negative.   Musculoskeletal: Negative.   Skin: Negative.   Neurological: Negative.   Endo/Heme/Allergies: Negative.     Psychiatric/Behavioral: Negative.      Objective:   Vitals:   12/06/17 1013  BP: 122/84  Pulse: 84  SpO2: 97%   Height: 5\' 4"  (162.6 cm)  Weight: 167 lb 12.8 oz (76.1 kg)   Physical Exam  HENT:  Head: Normocephalic.  Right Ear: Tympanic membrane, external ear and ear canal normal.  Left Ear: Tympanic membrane, external ear and ear canal normal.  Nose: Nose normal. No mucosal edema or rhinorrhea.  Mouth/Throat: Uvula is midline, oropharynx is clear and moist and mucous membranes are normal. No oropharyngeal exudate.  Eyes: Conjunctivae are normal.  Neck: Trachea normal. No tracheal tenderness present. No tracheal deviation present. No thyromegaly present.  Cardiovascular: Normal rate,  regular rhythm, S1 normal, S2 normal and normal heart sounds.  No murmur heard. Pulmonary/Chest: Breath sounds normal. No stridor. No respiratory distress. She has no wheezes. She has no rales.  Musculoskeletal: She exhibits no edema.  Lymphadenopathy:       Head (right side): No tonsillar adenopathy present.       Head (left side): No tonsillar adenopathy present.    She has no cervical adenopathy.  Neurological: She is alert.  Skin: No rash noted. She is not diaphoretic. No erythema. Nails show no clubbing.    Diagnostics:    Spirometry was performed and demonstrated an FEV1 of 1.71 at 89 % of predicted.  The patient had an Asthma Control Test with the following results: ACT Total Score: 18.    Assessment and Plan:   1. Asthma, well controlled, mild persistent   2. Other allergic rhinitis   3. LPRD (laryngopharyngeal reflux disease)   4. Fibromyalgia     1. Change Dexilant to Pantoprazole 40mg  in the morning    2. Continue Qvar Redihaler 80 Redihaler - two inhalations two times per day  3. Continue Dymista one spray each nostril two times per day  4. Continue montelukast 10mg  one time per day  5. Continue ProAir HFA and Zyrtec and nasal saline as needed  6. Return to clinic  in 6 month or earlier if problem  7. Can try ibuprofen 200mg  - 3-4 tablets one-two times a day to help with muscle soreness when exercising.  Stomach upset?  8. Obtain fall flu vaccine  Overall Xee appears to be doing relatively well and she will continue to use anti-inflammatory agents for both her upper and lower airway.  We will change her proton pump inhibitor given the lack of insurance coverage for Dexilant and she will continue to use therapy directed against LPR on a consistent basis.  I think that she would do much better if she exercised in general especially for her lung status and I made the recommendation that she try some nonsteroidal anti-inflammatory drugs to help with muscle inflammation associated with exercise and her fibromyalgia.  I will see her back in this clinic in 6 months or earlier if there is a problem.  Allena Katz, MD Allergy / Immunology [place]

## 2017-12-06 NOTE — Patient Instructions (Addendum)
  1. Change Dexilant to Pantoprazole 40mg  in the morning    2. Continue Qvar Redihaler 80 Redihaler - two inhalations two times per day  3. Continue Dymista one spray each nostril two times per day  4. Continue montelukast 10mg  one time per day  5. Continue ProAir HFA and Zyrtec and nasal saline as needed  6. Return to clinic in 6 month or earlier if problem  7. Can try ibuprofen 200mg  - 3-4 tablets one-two times a day to help with muscle soreness when exercising.  Stomach upset?  8. Obtain fall flu vaccine

## 2017-12-07 ENCOUNTER — Encounter: Payer: Self-pay | Admitting: Allergy and Immunology

## 2017-12-08 NOTE — Progress Notes (Signed)
Office Visit Note  Patient: Christina Deleon             Date of Birth: 16-Aug-1950           MRN: [id number]             PCP: Lanice Shirts, MD Referring: Lanice Shirts, * Visit Date: 12/09/2017 Occupation: @GUAROCC @  Subjective:  Right knee pain   History of Present Illness: Christina Deleon is a 67 y.o. female with history of fibromyalgia, osteoarthritis, and DDD.  Patient reports that she has been having right knee pain for the past 2 weeks.  She states she is no swelling warmth.  She states the pain is most severe when she is climbing steps.  She states that at times she has a sensation of the right knee going to give out on her.  She denies any injuries.  She states the pain started to radiate down her right leg.  She says she is also having some right foot pain base of the fifth metatarsal.  She states that her left knee has been having mild discomfort since she is compensating.  She states that she has been walking more frequently and feels that she has been overdoing it.  She states that she is having lower extremity muscle aches muscle tenderness due to fibromyalgia as well.  She continues to have chronic neck pain and stiffness.  She is having tenderness and tension in the trapezius muscles bilaterally.  She states that she had increased tension which is leading to more frequent migraines.  She states she continues to have chronic fatigue and insomnia.  She continues take Ambien 5 mg at bedtime.  She denies any pain in her hands at this time.  She continues to have a mucin cyst on her left fifth finger.    Activities of Daily Living:  Patient reports morning stiffness all day.   Patient Reports nocturnal pain.  Difficulty dressing/grooming: Denies Difficulty climbing stairs: Reports Difficulty getting out of chair: Reports Difficulty using hands for taps, buttons, cutlery, and/or writing: Reports  Review of Systems  Constitutional: Positive for fatigue.  HENT: Positive  for mouth dryness. Negative for mouth sores.   Eyes: Positive for itching and dryness. Negative for visual disturbance.  Respiratory: Negative for cough, hemoptysis, shortness of breath and difficulty breathing.   Cardiovascular: Positive for swelling in legs/feet. Negative for chest pain, palpitations and hypertension.  Gastrointestinal: Positive for constipation. Negative for blood in stool and diarrhea.  Endocrine: Negative for increased urination.  Genitourinary: Negative for painful urination.  Musculoskeletal: Positive for arthralgias, joint pain, joint swelling, muscle weakness, morning stiffness and muscle tenderness. Negative for myalgias and myalgias.  Skin: Positive for rash. Negative for color change, pallor, hair loss, nodules/bumps, skin tightness, ulcers and sensitivity to sunlight.  Allergic/Immunologic: Negative for susceptible to infections.  Hematological: Negative for bruising/bleeding tendency and swollen glands.  Psychiatric/Behavioral: Positive for sleep disturbance. Negative for depressed mood. The patient is not nervous/anxious.     PMFS History:  Patient Active Problem List   Diagnosis Date Noted  . Fibromyalgia 04/19/2016  . Other fatigue 04/19/2016  . 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 Primary osteoarthritis of both knees 04/19/2016  . Leg pain 01/22/2016  . Restless legs syndrome 01/22/2016  . Snoring 11/19/2015  . Periodic limb movement sleep disorder 11/19/2015  . GERD (gastroesophageal reflux disease) 10/08/2015  . Mild persistent asthma 04/09/2015  . LPRD (laryngopharyngeal reflux disease) 04/09/2015  . Allergic rhinoconjunctivitis 04/09/2015  .  HEMATOCHEZIA 04/01/2010  . COUGH 04/01/2010  . GASTRITIS 09/30/2009  . NAUSEA 09/30/2009  . ABDOMINAL PAIN-EPIGASTRIC 09/30/2009  . DYSTHYMIC DISORDER 09/26/2009  . PALPITATIONS 06/17/2009  . Impingement syndrome of shoulder, left 06/04/2009  . CONSTIPATION 02/05/2009  . PERSONAL HX COLONIC POLYPS 02/05/2009    . ASTHMA, PERSISTENT, MILD 02/04/2009  . CHEST PAIN, ATYPICAL 02/04/2009  . COLONIC POLYPS, BENIGN 12/18/2008  . HYPERLIPIDEMIA 12/18/2008  . Insomnia 12/18/2008  . MIGRAINE, COMMON 12/18/2008  . HYPERTENSION 12/18/2008  . ALLERGIC RHINITIS 12/18/2008  . Primary osteoarthritis of both hands 12/18/2008  . DJD (degenerative joint disease), cervical 12/18/2008  . NECK PAIN, CHRONIC 12/18/2008  . LOW BACK PAIN, CHRONIC 12/18/2008    Past Medical History:  Diagnosis Date  . Adenomatous colon polyp 10/1997, and 03/2009  . Allergic rhinitis   . Allergy   . Arthritis    osteo  . Asthma   . Chronic gastritis   . Fibromyalgia   . GERD (gastroesophageal reflux disease)   . Hemorrhoids   . Hyperlipidemia   . Hypertension   . Menorrhagia    partial hysterectomy  . Migraines   . PONV (postoperative nausea and vomiting)   . Sleep disturbance   . STD (sexually transmitted disease) 01/22/11 culture proven   HSV type I labia    Family History  Problem Relation Age of Onset  . Throat cancer Father   . Coronary artery disease Father   . Heart attack Father 25  . Stroke Father        during procedure  . Coronary artery disease Mother   . Hypertension Mother   . Hyperlipidemia Mother   . Pulmonary fibrosis Mother   . Lung disease Mother   . Prostate cancer Brother   . Heart disease Brother   . Diabetes Brother   . Diabetes Paternal Aunt        x 2  . Diabetes Paternal Grandmother   . Coronary artery disease Paternal Grandmother   . Stroke Maternal Grandmother        or MI  . Prostate cancer Paternal Uncle   . Migraines Daughter   . Migraines Daughter   . Colon cancer Neg Hx    Past Surgical History:  Procedure Laterality Date  . CATARACT EXTRACTION Bilateral 10/16 & 11/16  . EXCISION METACARPAL MASS Right 11/16/2016   Procedure: RIGHT LITTLE FINGER CYST EXCISION;  Surgeon: Milly Jakob, MD;  Location: [place];  Service: Orthopedics;  Laterality: Right;   . TONSILLECTOMY  age 58  . tooth extract     with bone graft  . Turbinate sinus surgery  2003  . VAGINAL HYSTERECTOMY  1998   secondary to prolapse, ovaries remain   Social History   Social History Narrative   She lives with husband.  They have 2 grown children.   She is retired Product/process development scientist.   Highest level of education:  2 years of college      Regular exercise, diet of fruits, veggies, limited fried foods, limited water      [facility]:   Originally from [place]. Has also lived in [place]. Previously worked doing Software engineer. No pets currently. No bird, mold, or hot tub exposure. Does have a musty smell in their home but it is new. Enjoys reading. 1 indoor plant. Has carpet in her home including in the bedroom. Also has draperies.     Objective: Vital Signs: BP 130/75 (BP Location: Left Arm, Patient Position: Sitting,  Cuff Size: Normal)   Pulse 80   Resp 14   Ht 5\' 4"  (1.626 m)   Wt 166 lb 12.8 oz (75.7 kg)   LMP 07/02/1994   BMI 28.63 kg/m    Physical Exam  Constitutional: She is oriented to person, place, and time. She appears well-developed and well-nourished.  HENT:  Head: Normocephalic and atraumatic.  Eyes: Conjunctivae and EOM are normal.  Neck: Normal range of motion.  Cardiovascular: Normal rate, regular rhythm, normal heart sounds and intact distal pulses.  Pulmonary/Chest: Effort normal and breath sounds normal.  Abdominal: Soft. Bowel sounds are normal.  Lymphadenopathy:    She has no cervical adenopathy.  Neurological: She is alert and oriented to person, place, and time.  Skin: Skin is warm and dry. Capillary refill takes less than 2 seconds.  Psychiatric: She has a normal mood and affect. Her behavior is normal.  Nursing note and vitals reviewed.    Musculoskeletal Exam: C-spine limited range of motion.  Thoracic and lumbar spine good range of motion.  She has midline spinal tenderness in the lumbar region.  She  has mild tenderness of bilateral SI joints.  Shoulder joints, elbow joints, wrist joints, MCPs, PIPs, DIPs good range of motion no synovitis.  She is complete fist formation bilaterally.  Hip joints full range of motion with no discomfort.  She has limited extension of bilateral knee joints.  She has bilateral knee crepitus.  She has warmth and swelling of the right knee joint.  She has tenderness at the base of the right fifth metatarsal.  No tenderness of trochanteric bursa bilaterally.  CDAI Exam: CDAI Score: Not documented Patient Global Assessment: Not documented; Provider Global Assessment: Not documented Swollen: Not documented; Tender: Not documented Joint Exam   Not documented   There is currently no information documented on the homunculus. Go to the Rheumatology activity and complete the homunculus joint exam.  Investigation: No additional findings.  Imaging: No results found.  Recent Labs: Lab Results  Component Value Date   WBC 4.2 10/20/2017   HGB 13.5 10/20/2017   PLT 349 10/20/2017   NA 142 10/20/2017   K 4.5 10/20/2017   CL 106 10/20/2017   CO2 29 10/20/2017   GLUCOSE 84 10/20/2017   BUN 15 10/20/2017   CREATININE 0.90 10/20/2017   BILITOT 0.4 10/20/2017   ALKPHOS 69 06/23/2009   AST 14 10/20/2017   ALT 9 10/20/2017   PROT 6.7 10/20/2017   ALBUMIN 3.9 06/23/2009   CALCIUM 9.7 10/20/2017   GFRAA 77 10/20/2017    Speciality Comments: No specialty comments available.  Procedures:  Large Joint Inj: R knee on 12/09/2017 9:58 AM Indications: pain Details: 27 G 1.5 in needle, medial approach  Arthrogram: No  Medications: 1.5 mL lidocaine 1 %; 40 mg triamcinolone acetonide 40 MG/ML Aspirate: 0 mL Outcome: tolerated well, no immediate complications Procedure, treatment alternatives, risks and benefits explained, specific risks discussed. Consent was given by the patient. Immediately prior to procedure a time out was called to verify the correct patient,  procedure, equipment, support staff and site/side marked as required. Patient was prepped and draped in the usual sterile fashion.     Allergies: Trazodone hcl and Penicillins   Assessment / Plan:     Visit Diagnoses: Fibromyalgia - She continues to have generalized muscle aches and muscle tenderness due to fibromyalgia.  She has trapezius muscle tension and tenderness.  Myalgias have been more severe in bilateral lower extremities over the past 2 weeks.  She continues to take Relafen 500 mg twice daily for pain relief. She has chronic fatigue and insomnia.  She has been taking Ambien 5 mg at bedtime.   Other insomnia -She takes Ambien 5 mg po daily at bedtime.  Other fatigue: Chronic   Primary osteoarthritis of both hands: She has PIP and DIP synovial thickening consistent with osteoarthritis of both hands.  She has no discomfort at this time.  Joint protection and muscle strengthening were discussed.   Primary osteoarthritis of both knees: She has been having increased right knee pain for the past 2 weeks.  Her left knee has caused mild discomfort due to compensating when walking.  A x-ray of the right knee was obtained today.  A cortisone injection was performed.   Impingement syndrome of shoulder, left: She has good ROM with no discomfort at this time.   Plantar fasciitis, right: Improved.   DDD (degenerative disc disease), cervical: She has limited ROM with discomfort.  No symptoms of radiculopathy at this time.   DDD lumbar spine: She has mild midline spinal tenderness in the lumbar region.   Chronic pain of right knee - She has been having right knee pain for the past 2 weeks.  She has warmth and swelling of the right knee with limited extension on exam.  she denies any injury.  The pain is most severe when going up and down steps.  A x-ray of the right knee was obtained today.  She was given a prescription for Voltaren gel.  we also discussed the indications and potential side effects  of a right knee cortisone injection.  Consent was obtained.  She tolerated the procedure well.  She was encouraged to ice and elevate the right knee for the next few days.  She was given a handout of exercises that she can start next week. Plan: XR KNEE 3 VIEW RIGHT  Pain in right ankle and joints of right foot -She has tenderness at the base of the 5th metatarsal.  A x-ray of the right foot was obtained today.  She was encouraged to use voltaren gel. Plan: XR Foot 2 Views Right  History of migraine  History of asthma  History of hypertension  History of hyperlipidemia  History of colon polyps  History of gastroesophageal reflux (GERD)   Orders: Orders Placed This Encounter  Procedures  . Large Joint Inj  . XR KNEE 3 VIEW RIGHT  . XR Foot 2 Views Right   Meds ordered this encounter  Medications  . diclofenac sodium (VOLTAREN) 1 % GEL    Sig: Apply 3 grams to 3 large joints up to 3 times daily    Dispense:  3 Tube    Refill:  3    Face-to-face time spent with patient was 30 minutes. Greater than 50% of time was spent in counseling and coordination of care.  Follow-Up Instructions: Return in about 6 months (around 06/11/2018) for Fibromyalgia, Osteoarthritis, DDD.   Ofilia Neas, PA-C  Note - This record has been created using Dragon software.  Chart creation errors have been sought, but may not always  have been located. Such creation errors do not reflect on  the standard of medical care.

## 2017-12-09 ENCOUNTER — Encounter: Payer: Self-pay | Admitting: Physician Assistant

## 2017-12-09 ENCOUNTER — Ambulatory Visit (INDEPENDENT_AMBULATORY_CARE_PROVIDER_SITE_OTHER): Payer: Medicare Other | Admitting: Physician Assistant

## 2017-12-09 ENCOUNTER — Ambulatory Visit (INDEPENDENT_AMBULATORY_CARE_PROVIDER_SITE_OTHER): Payer: Self-pay

## 2017-12-09 VITALS — BP 130/75 | HR 80 | Resp 14 | Ht 64.0 in | Wt 166.8 lb

## 2017-12-09 DIAGNOSIS — Z8669 Personal history of other diseases of the nervous system and sense organs: Secondary | ICD-10-CM

## 2017-12-09 DIAGNOSIS — G8929 Other chronic pain: Secondary | ICD-10-CM

## 2017-12-09 DIAGNOSIS — M47816 Spondylosis without myelopathy or radiculopathy, lumbar region: Secondary | ICD-10-CM | POA: Diagnosis not present

## 2017-12-09 DIAGNOSIS — Z86010 Personal history of colonic polyps: Secondary | ICD-10-CM

## 2017-12-09 DIAGNOSIS — M25571 Pain in right ankle and joints of right foot: Secondary | ICD-10-CM

## 2017-12-09 DIAGNOSIS — M797 Fibromyalgia: Secondary | ICD-10-CM | POA: Diagnosis not present

## 2017-12-09 DIAGNOSIS — M25561 Pain in right knee: Secondary | ICD-10-CM

## 2017-12-09 DIAGNOSIS — M7542 Impingement syndrome of left shoulder: Secondary | ICD-10-CM | POA: Diagnosis not present

## 2017-12-09 DIAGNOSIS — R5383 Other fatigue: Secondary | ICD-10-CM

## 2017-12-09 DIAGNOSIS — M5030 Other cervical disc degeneration, unspecified cervical region: Secondary | ICD-10-CM

## 2017-12-09 DIAGNOSIS — G4709 Other insomnia: Secondary | ICD-10-CM

## 2017-12-09 DIAGNOSIS — Z8639 Personal history of other endocrine, nutritional and metabolic disease: Secondary | ICD-10-CM

## 2017-12-09 DIAGNOSIS — M722 Plantar fascial fibromatosis: Secondary | ICD-10-CM | POA: Diagnosis not present

## 2017-12-09 DIAGNOSIS — M19042 Primary osteoarthritis, left hand: Secondary | ICD-10-CM

## 2017-12-09 DIAGNOSIS — M19041 Primary osteoarthritis, right hand: Secondary | ICD-10-CM

## 2017-12-09 DIAGNOSIS — Z8719 Personal history of other diseases of the digestive system: Secondary | ICD-10-CM

## 2017-12-09 DIAGNOSIS — M170 Bilateral primary osteoarthritis of knee: Secondary | ICD-10-CM | POA: Diagnosis not present

## 2017-12-09 DIAGNOSIS — Z8709 Personal history of other diseases of the respiratory system: Secondary | ICD-10-CM

## 2017-12-09 DIAGNOSIS — Z8679 Personal history of other diseases of the circulatory system: Secondary | ICD-10-CM

## 2017-12-09 MED ORDER — LIDOCAINE HCL 1 % IJ SOLN
1.5000 mL | INTRAMUSCULAR | Status: AC | PRN
  Administered 2017-12-09: 1.5 mL

## 2017-12-09 MED ORDER — DICLOFENAC SODIUM 1 % TD GEL: TRANSDERMAL | 3 refills | Status: DC

## 2017-12-09 MED ORDER — TRIAMCINOLONE ACETONIDE 40 MG/ML IJ SUSP
40.0000 mg | INTRAMUSCULAR | Status: AC | PRN
  Administered 2017-12-09: 40 mg via INTRA_ARTICULAR

## 2017-12-09 NOTE — Patient Instructions (Signed)
Shoulder Exercises Ask your health care provider which exercises are safe for you. Do exercises exactly as told by your health care provider and adjust them as directed. It is normal to feel mild stretching, pulling, tightness, or discomfort as you do these exercises, but you should stop right away if you feel sudden pain or your pain gets worse.Do not begin these exercises until told by your health care provider. RANGE OF MOTION EXERCISES These exercises warm up your muscles and joints and improve the movement and flexibility of your shoulder. These exercises also help to relieve pain, numbness, and tingling. These exercises involve stretching your injured shoulder directly. Exercise A: Pendulum  1. Stand near a wall or a surface that you can hold onto for balance. 2. Bend at the waist and let your left / right arm hang straight down. Use your other arm to support you. Keep your back straight and do not lock your knees. 3. Relax your left / right arm and shoulder muscles, and move your hips and your trunk so your left / right arm swings freely. Your arm should swing because of the motion of your body, not because you are using your arm or shoulder muscles. 4. Keep moving your body so your arm swings in the following directions, as told by your health care provider: ? Side to side. ? Forward and backward. ? In clockwise and counterclockwise circles. 5. Continue each motion for __________ seconds, or for as long as told by your health care provider. 6. Slowly return to the starting position. Repeat __________ times. Complete this exercise __________ times a day. Exercise B:Flexion, Standing  1. Stand and hold a broomstick, a cane, or a similar object. Place your hands a little more than shoulder-width apart on the object. Your left / right hand should be palm-up, and your other hand should be palm-down. 2. Keep your elbow straight and keep your shoulder muscles relaxed. Push the stick down with  your healthy arm to raise your left / right arm in front of your body, and then over your head until you feel a stretch in your shoulder. ? Avoid shrugging your shoulder while you raise your arm. Keep your shoulder blade tucked down toward the middle of your back. 3. Hold for __________ seconds. 4. Slowly return to the starting position. Repeat __________ times. Complete this exercise __________ times a day. Exercise C: Abduction, Standing 1. Stand and hold a broomstick, a cane, or a similar object. Place your hands a little more than shoulder-width apart on the object. Your left / right hand should be palm-up, and your other hand should be palm-down. 2. While keeping your elbow straight and your shoulder muscles relaxed, push the stick across your body toward your left / right side. Raise your left / right arm to the side of your body and then over your head until you feel a stretch in your shoulder. ? Do not raise your arm above shoulder height, unless your health care provider tells you to do that. ? Avoid shrugging your shoulder while you raise your arm. Keep your shoulder blade tucked down toward the middle of your back. 3. Hold for __________ seconds. 4. Slowly return to the starting position. Repeat __________ times. Complete this exercise __________ times a day. Exercise D:Internal Rotation  1. Place your left / right hand behind your back, palm-up. 2. Use your other hand to dangle an exercise band, a towel, or a similar object over your shoulder. Grasp the band with   your left / right hand so you are holding onto both ends. 3. Gently pull up on the band until you feel a stretch in the front of your left / right shoulder. ? Avoid shrugging your shoulder while you raise your arm. Keep your shoulder blade tucked down toward the middle of your back. 4. Hold for __________ seconds. 5. Release the stretch by letting go of the band and lowering your hands. Repeat __________ times. Complete  this exercise __________ times a day. STRETCHING EXERCISES These exercises warm up your muscles and joints and improve the movement and flexibility of your shoulder. These exercises also help to relieve pain, numbness, and tingling. These exercises are done using your healthy shoulder to help stretch the muscles of your injured shoulder. Exercise E: Corner Stretch (External Rotation and Abduction)  1. Stand in a doorway with one of your feet slightly in front of the other. This is called a staggered stance. If you cannot reach your forearms to the door frame, stand facing a corner of a room. 2. Choose one of the following positions as told by your health care provider: ? Place your hands and forearms on the door frame above your head. ? Place your hands and forearms on the door frame at the height of your head. ? Place your hands on the door frame at the height of your elbows. 3. Slowly move your weight onto your front foot until you feel a stretch across your chest and in the front of your shoulders. Keep your head and chest upright and keep your abdominal muscles tight. 4. Hold for __________ seconds. 5. To release the stretch, shift your weight to your back foot. Repeat __________ times. Complete this stretch __________ times a day. Exercise F:Extension, Standing 1. Stand and hold a broomstick, a cane, or a similar object behind your back. ? Your hands should be a little wider than shoulder-width apart. ? Your palms should face away from your back. 2. Keeping your elbows straight and keeping your shoulder muscles relaxed, move the stick away from your body until you feel a stretch in your shoulder. ? Avoid shrugging your shoulders while you move the stick. Keep your shoulder blade tucked down toward the middle of your back. 3. Hold for __________ seconds. 4. Slowly return to the starting position. Repeat __________ times. Complete this exercise __________ times a day. STRENGTHENING  EXERCISES These exercises build strength and endurance in your shoulder. Endurance is the ability to use your muscles for a long time, even after they get tired. Exercise G:External Rotation  1. Sit in a stable chair without armrests. 2. Secure an exercise band at elbow height on your left / right side. 3. Place a soft object, such as a folded towel or a small pillow, between your left / right upper arm and your body to move your elbow a few inches away (about 10 cm) from your side. 4. Hold the end of the band so it is tight and there is no slack. 5. Keeping your elbow pressed against the soft object, move your left / right forearm out, away from your abdomen. Keep your body steady so only your forearm moves. 6. Hold for __________ seconds. 7. Slowly return to the starting position. Repeat __________ times. Complete this exercise __________ times a day. Exercise H:Shoulder Abduction  1. Sit in a stable chair without armrests, or stand. 2. Hold a __________ weight in your left / right hand, or hold an exercise band with both hands.   3. Start with your arms straight down and your left / right palm facing in, toward your body. 4. Slowly lift your left / right hand out to your side. Do not lift your hand above shoulder height unless your health care provider tells you that this is safe. ? Keep your arms straight. ? Avoid shrugging your shoulder while you do this movement. Keep your shoulder blade tucked down toward the middle of your back. 5. Hold for __________ seconds. 6. Slowly lower your arm, and return to the starting position. Repeat __________ times. Complete this exercise __________ times a day. Exercise I:Shoulder Extension 1. Sit in a stable chair without armrests, or stand. 2. Secure an exercise band to a stable object in front of you where it is at shoulder height. 3. Hold one end of the exercise band in each hand. Your palms should face each other. 4. Straighten your elbows and  lift your hands up to shoulder height. 5. Step back, away from the secured end of the exercise band, until the band is tight and there is no slack. 6. Squeeze your shoulder blades together as you pull your hands down to the sides of your thighs. Stop when your hands are straight down by your sides. Do not let your hands go behind your body. 7. Hold for __________ seconds. 8. Slowly return to the starting position. Repeat __________ times. Complete this exercise __________ times a day. Exercise J:Standing Shoulder Row 1. Sit in a stable chair without armrests, or stand. 2. Secure an exercise band to a stable object in front of you so it is at waist height. 3. Hold one end of the exercise band in each hand. Your palms should be in a thumbs-up position. 4. Bend each of your elbows to an "L" shape (about 90 degrees) and keep your upper arms at your sides. 5. Step back until the band is tight and there is no slack. 6. Slowly pull your elbows back behind you. 7. Hold for __________ seconds. 8. Slowly return to the starting position. Repeat __________ times. Complete this exercise __________ times a day. Exercise K:Shoulder Press-Ups  1. Sit in a stable chair that has armrests. Sit upright, with your feet flat on the floor. 2. Put your hands on the armrests so your elbows are bent and your fingers are pointing forward. Your hands should be about even with the sides of your body. 3. Push down on the armrests and use your arms to lift yourself off of the chair. Straighten your elbows and lift yourself up as much as you comfortably can. ? Move your shoulder blades down, and avoid letting your shoulders move up toward your ears. ? Keep your feet on the ground. As you get stronger, your feet should support less of your body weight as you lift yourself up. 4. Hold for __________ seconds. 5. Slowly lower yourself back into the chair. Repeat __________ times. Complete this exercise __________ times a  day. Exercise L: Wall Push-Ups  1. Stand so you are facing a stable wall. Your feet should be about one arm-length away from the wall. 2. Lean forward and place your palms on the wall at shoulder height. 3. Keep your feet flat on the floor as you bend your elbows and lean forward toward the wall. 4. Hold for __________ seconds. 5. Straighten your elbows to push yourself back to the starting position. Repeat __________ times. Complete this exercise __________ times a day. This information is not intended to replace advice   given to you by your health care provider. Make sure you discuss any questions you have with your health care provider. Document Released: 03/03/2005 Document Revised: 01/12/2016 Document Reviewed: 12/29/2014 Elsevier Interactive Patient Education  2018 [employer]  

## 2017-12-20 ENCOUNTER — Other Ambulatory Visit: Payer: Self-pay | Admitting: Physician Assistant

## 2017-12-21 NOTE — Telephone Encounter (Signed)
Last Visit: 12/09/17 Next Visit: 06/13/18  Last Fill: 10/20/17  Okay to refill Ambien?

## 2017-12-26 DIAGNOSIS — J4541 Moderate persistent asthma with (acute) exacerbation: Secondary | ICD-10-CM | POA: Diagnosis not present

## 2018-01-03 ENCOUNTER — Telehealth: Payer: Self-pay | Admitting: Allergy and Immunology

## 2018-01-03 MED ORDER — CLOTRIMAZOLE 10 MG MT TROC: 10.0000 mg | Freq: Every day | OROMUCOSAL | 0 refills | Status: AC

## 2018-01-03 MED ORDER — BUDESONIDE-FORMOTEROL FUMARATE 160-4.5 MCG/ACT IN AERO: 2.0000 | INHALATION_SPRAY | Freq: Two times a day (BID) | RESPIRATORY_TRACT | 3 refills | Status: DC

## 2018-01-03 MED ORDER — AEROCHAMBER PLUS MISC: 2 refills | Status: DC

## 2018-01-03 NOTE — Telephone Encounter (Signed)
Patient was seen 12-06-17, by Dr. Neldon Mc. He told her if her Qvar, doesn't work, to let him know. She said it is not working very well and she is using her rescue inhaler 2-3 times a day and now has thrush. She would like to know if there is an alternative for this and needs something sent in for her thrush. She is going out of town on Thursday. [facility] on [address].

## 2018-01-03 NOTE — Telephone Encounter (Signed)
Informed patient of instructions and and she agreed with plan. Patient did not have a spacer. Sent script for spacer as well.

## 2018-01-03 NOTE — Telephone Encounter (Signed)
Left message for patient to call office. Sent script for Symbicort and Clotrimazole into pharmacy.

## 2018-01-03 NOTE — Telephone Encounter (Signed)
Dr Verlin Fester please advise she is a Dr Neldon Mc patient

## 2018-01-03 NOTE — Telephone Encounter (Signed)
Please provide a prescription for Symbicort (budesonide/formoterol) 160/4.5 g, 2 inhalations twice a day. To maximize pulmonary deposition, please provide a spacer along with instructions for its proper administration with an HFA inhaler.  Regarding thrush, please provide a prescription for clotrimazole 10 mg troches: Slowly dissolve in the mouth 5 times per day for 10 days, not to be chewed or swallowed whole.  Follow-up as directed with Dr. Neldon Mc.

## 2018-01-06 ENCOUNTER — Encounter: Payer: Self-pay | Admitting: Neurology

## 2018-01-24 DIAGNOSIS — E785 Hyperlipidemia, unspecified: Secondary | ICD-10-CM | POA: Diagnosis not present

## 2018-01-24 DIAGNOSIS — Z0000 Encounter for general adult medical examination without abnormal findings: Secondary | ICD-10-CM | POA: Diagnosis not present

## 2018-01-24 DIAGNOSIS — E559 Vitamin D deficiency, unspecified: Secondary | ICD-10-CM | POA: Diagnosis not present

## 2018-01-24 DIAGNOSIS — J4530 Mild persistent asthma, uncomplicated: Secondary | ICD-10-CM | POA: Diagnosis not present

## 2018-01-24 DIAGNOSIS — Z1389 Encounter for screening for other disorder: Secondary | ICD-10-CM | POA: Diagnosis not present

## 2018-01-24 DIAGNOSIS — I10 Essential (primary) hypertension: Secondary | ICD-10-CM | POA: Diagnosis not present

## 2018-01-24 DIAGNOSIS — F411 Generalized anxiety disorder: Secondary | ICD-10-CM | POA: Diagnosis not present

## 2018-01-24 DIAGNOSIS — Z23 Encounter for immunization: Secondary | ICD-10-CM | POA: Diagnosis not present

## 2018-01-27 DIAGNOSIS — I10 Essential (primary) hypertension: Secondary | ICD-10-CM | POA: Diagnosis not present

## 2018-01-27 IMAGING — CR DG CHEST 2V
2 series · 2 of 2 positions shown · non-contrast
Comparison: 01/21/2015.

CLINICAL DATA: Cough.  Initial evaluation .

EXAM:
CHEST  2 VIEW

[w chest pa]
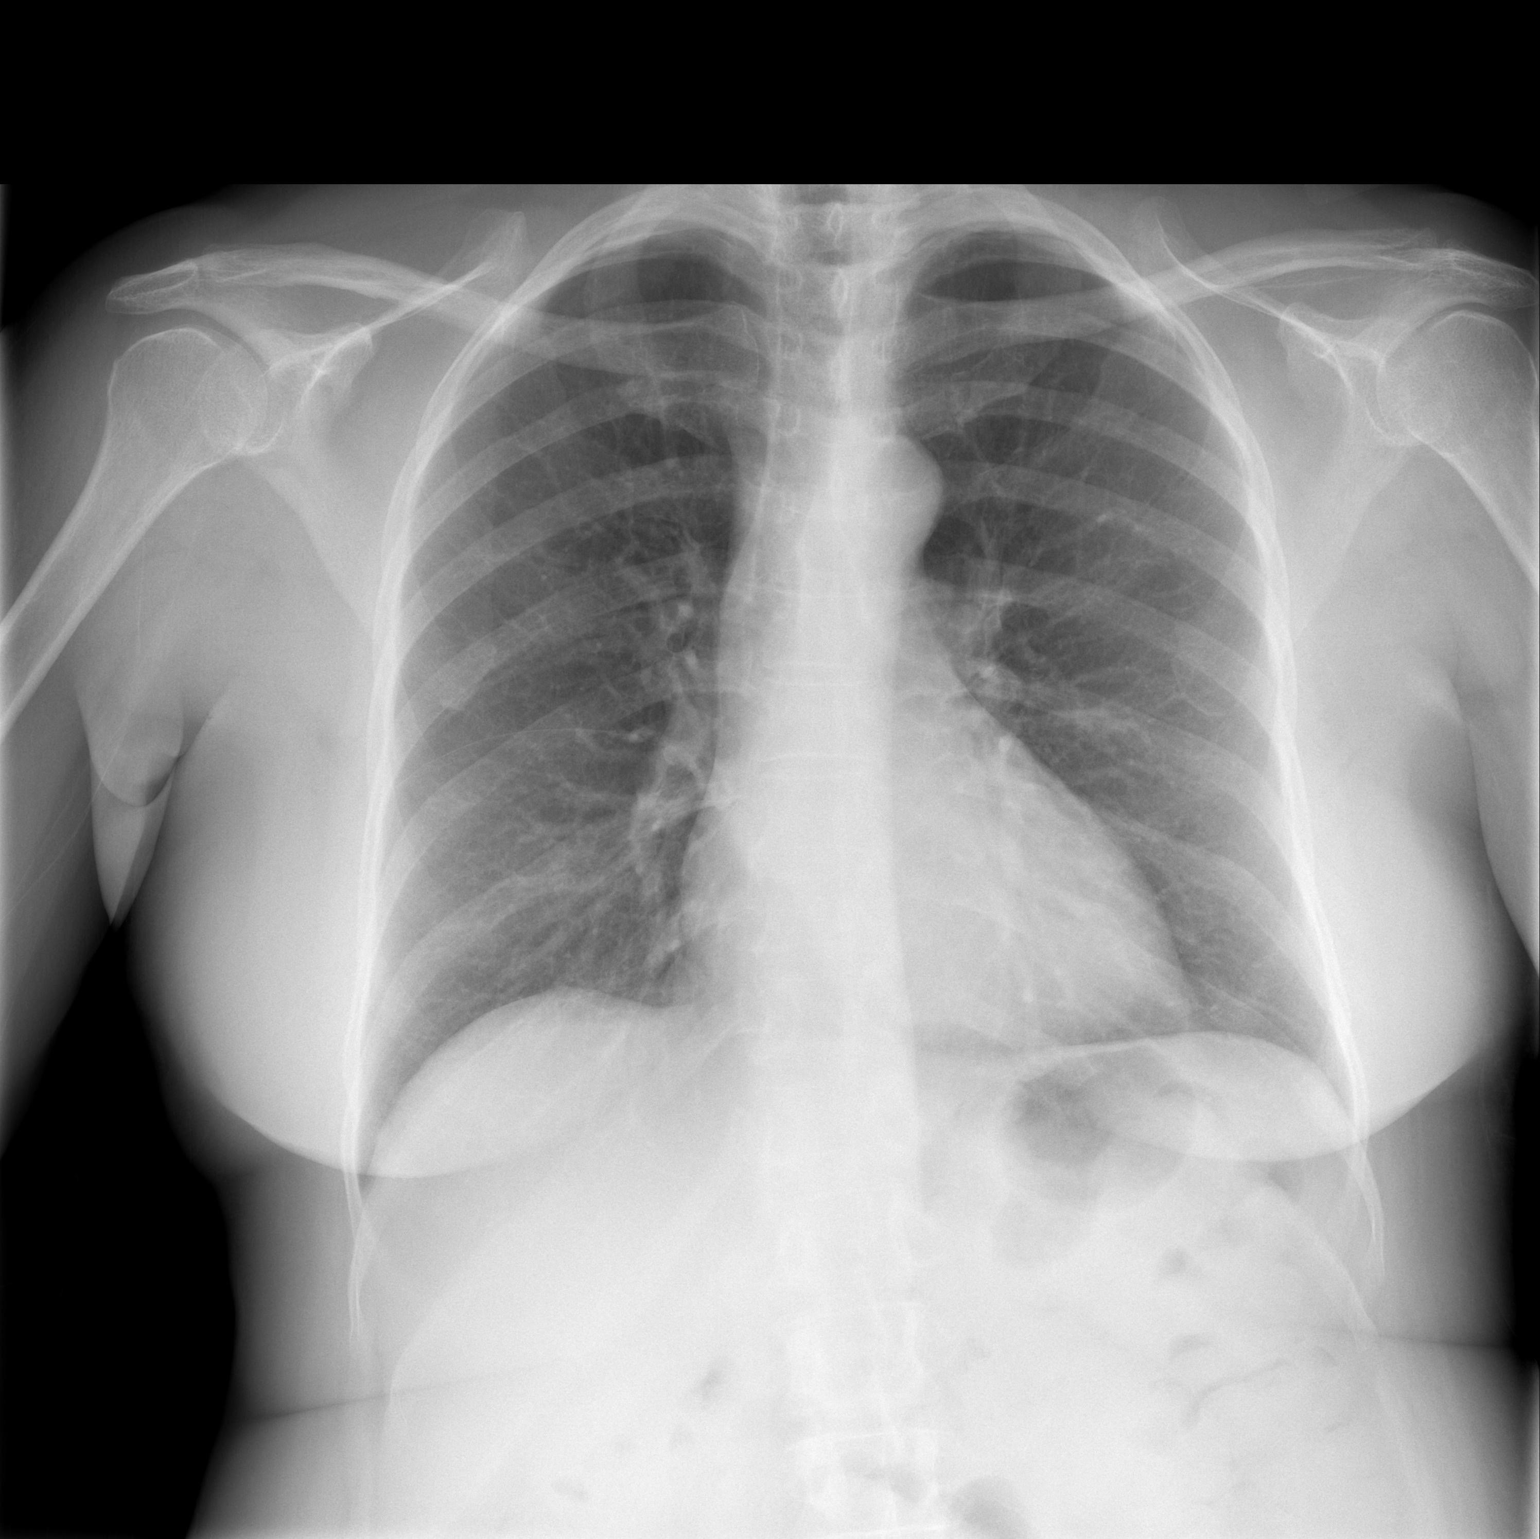

[w chest lat]
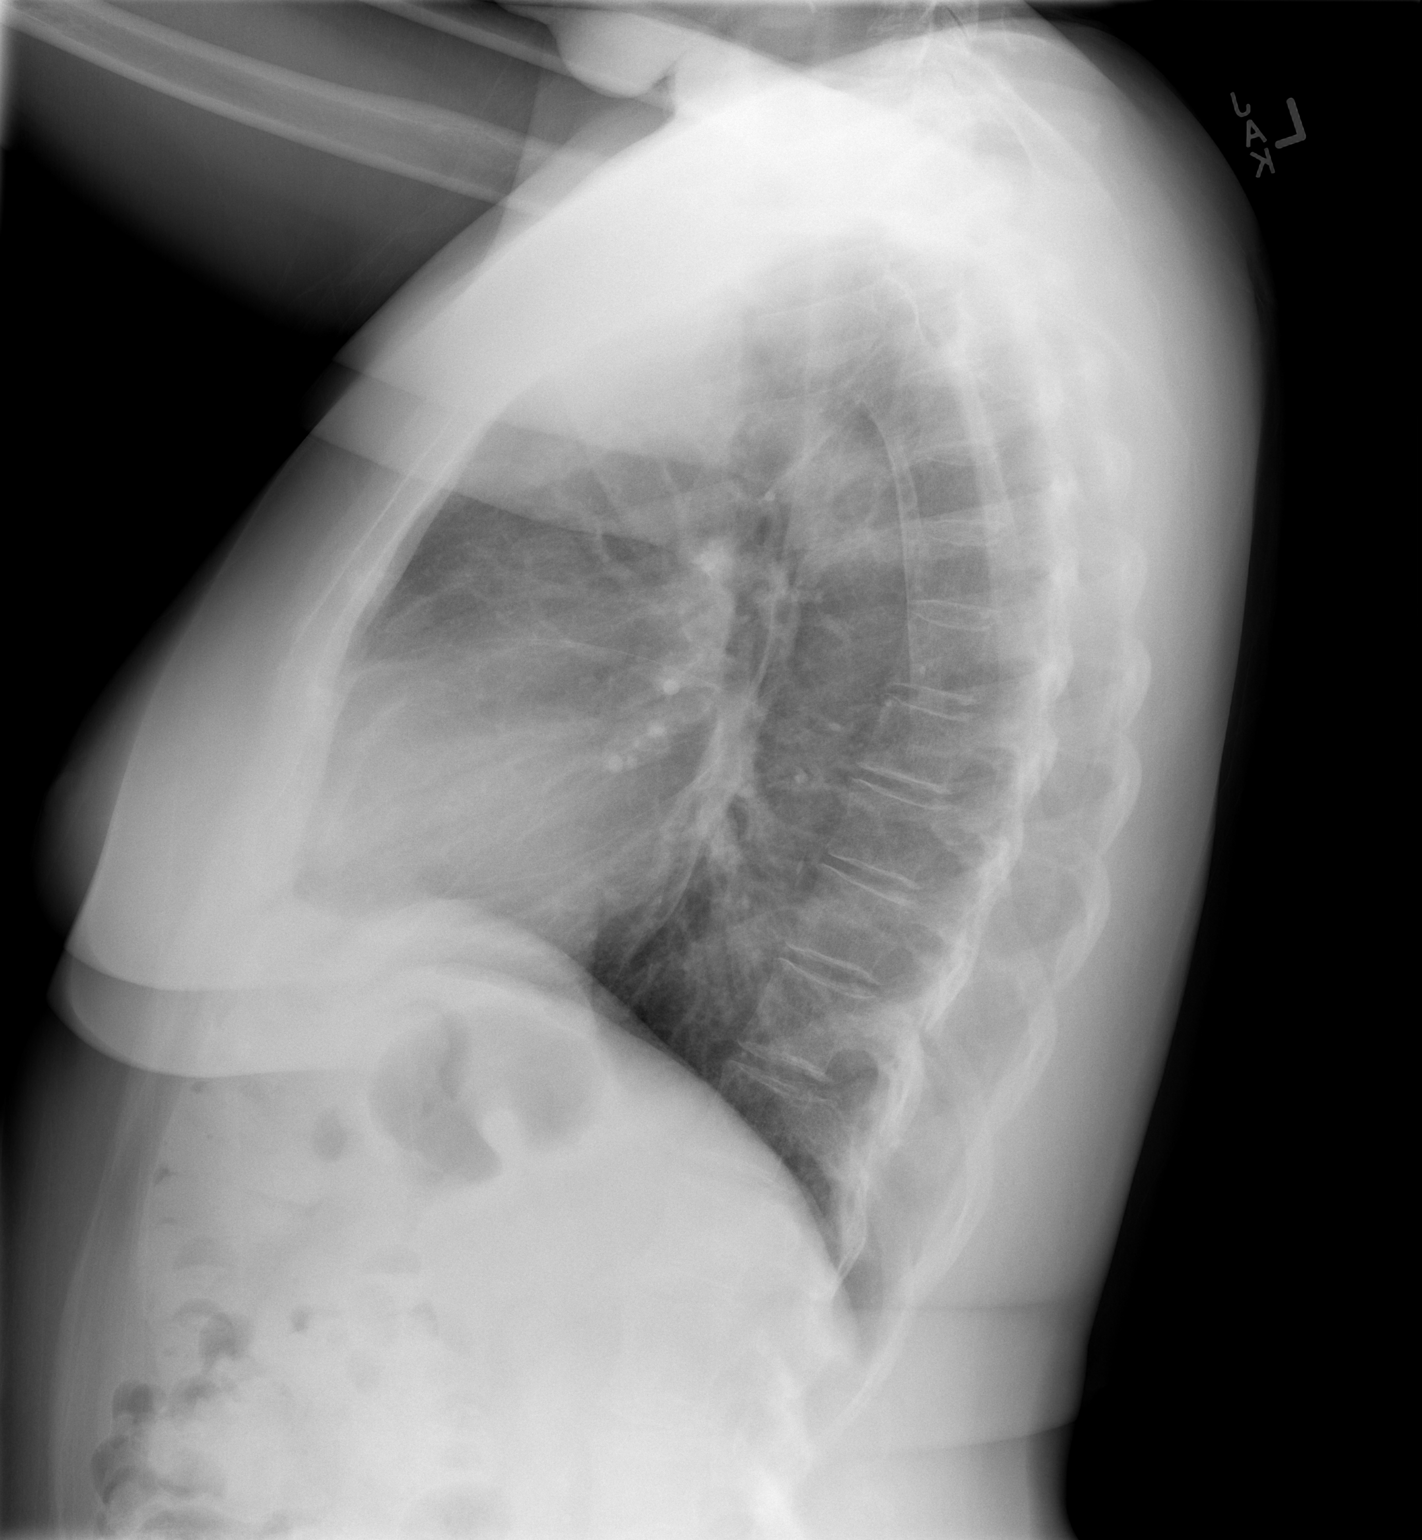

[2 of 2 positions shown; findings below may reference images not displayed]

FINDINGS: Mediastinum hilar structures normal. Lungs are clear. Heart size
normal. No pleural effusion or pneumothorax. No acute bony
abnormality .
IMPRESSION: No acute cardiopulmonary disease.  Chest is stable from prior exam.

## 2018-02-06 ENCOUNTER — Other Ambulatory Visit: Payer: Self-pay | Admitting: Physician Assistant

## 2018-02-06 NOTE — Telephone Encounter (Signed)
Last Visit: 12/09/17 Next Visit: 06/13/18  Last Fill: 12/21/17  Okay to refill Ambien?

## 2018-02-12 ENCOUNTER — Other Ambulatory Visit: Payer: Self-pay | Admitting: Allergy and Immunology

## 2018-02-21 ENCOUNTER — Telehealth: Payer: Self-pay | Admitting: Nurse Practitioner

## 2018-02-21 NOTE — Telephone Encounter (Signed)
Patient states she has been getting medication Rantidine over the counter but now there is a recall on it and patient wants to know what to do. Patient states medication was not really helping anyways.

## 2018-02-21 NOTE — Telephone Encounter (Signed)
Spoke with the patient. She has had break through GERD symptoms "all year" despite Zantac treatment. She also has asthma. Symptoms are getting worse. She is presently very hoarse. She is interested in being seen by her primary GI to discuss this as well as changing from Zantac.  Appointment scheduled.

## 2018-02-22 ENCOUNTER — Ambulatory Visit (INDEPENDENT_AMBULATORY_CARE_PROVIDER_SITE_OTHER): Payer: Medicare Other | Admitting: Gastroenterology

## 2018-02-22 ENCOUNTER — Encounter: Payer: Self-pay | Admitting: Gastroenterology

## 2018-02-22 VITALS — BP 122/70 | HR 68 | Ht 64.0 in | Wt 168.0 lb

## 2018-02-22 DIAGNOSIS — R490 Dysphonia: Secondary | ICD-10-CM

## 2018-02-22 DIAGNOSIS — R05 Cough: Secondary | ICD-10-CM | POA: Diagnosis not present

## 2018-02-22 DIAGNOSIS — J45909 Unspecified asthma, uncomplicated: Secondary | ICD-10-CM | POA: Diagnosis not present

## 2018-02-22 DIAGNOSIS — R0989 Other specified symptoms and signs involving the circulatory and respiratory systems: Secondary | ICD-10-CM | POA: Diagnosis not present

## 2018-02-22 DIAGNOSIS — K219 Gastro-esophageal reflux disease without esophagitis: Secondary | ICD-10-CM | POA: Diagnosis not present

## 2018-02-22 DIAGNOSIS — R059 Cough, unspecified: Secondary | ICD-10-CM

## 2018-02-22 MED ORDER — PANTOPRAZOLE SODIUM 40 MG PO TBEC: 40.0000 mg | DELAYED_RELEASE_TABLET | Freq: Two times a day (BID) | ORAL | 11 refills | Status: DC

## 2018-02-22 MED ORDER — FAMOTIDINE 40 MG PO TABS: 40.0000 mg | ORAL_TABLET | Freq: Every day | ORAL | 11 refills | Status: DC

## 2018-02-22 NOTE — Progress Notes (Signed)
    History of Present Illness: This is a 67 year old female who has persistent problems with cough, hoarseness, globus sensation, regurgitation.  She was recently treated with pantoprazole in the morning and ranitidine in the evening but discontinued ranitidine as it was withdrawn from the market.  She previously tried Dexilant without adequate symptom control.  She has not had pulmonary follow-up and does not recall prior ENT evaluation.  Current Medications, Allergies, Past Medical History, Past Surgical History, Family History and Social History were reviewed in [employer] record.  Physical Exam: General: Well developed, well nourished, no acute distress Head: Normocephalic and atraumatic Eyes:  sclerae anicteric, EOMI Ears: Normal auditory acuity Mouth: No deformity or lesions Lungs: Clear throughout to auscultation Heart: Regular rate and rhythm; no murmurs, rubs or bruits Abdomen: Soft, non tender and non distended. No masses, hepatosplenomegaly or hernias noted. Normal Bowel sounds Rectal: Not done Musculoskeletal: Symmetrical with no gross deformities  Pulses:  Normal pulses noted Extremities: No clubbing, cyanosis, edema or deformities noted Neurological: Alert oriented x 4, grossly nonfocal Psychological:  Alert and cooperative. Normal mood and affect   Assessment and Recommendations:  1. 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  Rule out asthma, rhinitis and other disorders contributing to her symptoms.  Intensify antireflux therapy.  Follow all standard antireflux measures including 4 inch head of bed elevation.  Increase pantoprazole to 40 mg twice daily.  Begin famotidine 40 mg hs. Pulmonary and ENT referrals.  Consider EGD at return visit.  REV in 2 months.

## 2018-02-22 NOTE — Patient Instructions (Addendum)
We have sent the following medications to your pharmacy for you to pick up at your convenience: Protonix 40 mg twice daily and famotidine 40 mg at bedtime.   Patient advised to avoid spicy, acidic, citrus, chocolate, mints, fruit and fruit juices.  Limit the intake of caffeine, alcohol and Soda.  Don't exercise too soon after eating.  Don't lie down within 3-4 hours of eating.  Elevate the head of your bed.   [facility] will contact you with an appointment date and time.   Your appointment with Dr. Constance Holster at [facility] is on 02/28/18 at 3:00pm. If you need to reschedule please contact there office at [phone].  Normal BMI (Body Mass Index- based on height and weight) is between 23 and 30. Your BMI today is Body mass index is 28.84 kg/m. Marland Kitchen Please consider follow up  regarding your BMI with your Primary Care Provider.  Thank you for choosing me and [facility].  Pricilla Riffle. Dagoberto Ligas., MD., Marval Regal

## 2018-02-28 DIAGNOSIS — J370 Chronic laryngitis: Secondary | ICD-10-CM | POA: Diagnosis not present

## 2018-02-28 DIAGNOSIS — I10 Essential (primary) hypertension: Secondary | ICD-10-CM | POA: Diagnosis not present

## 2018-02-28 NOTE — Progress Notes (Signed)
67 y.o. [id number] Married Black or [place] American Not Hispanic or Latino female here for annual exam.   She has been dealing with Asthma and Gerd.  She c/o a several month h/o intermittent lower abdominal pressure, no pain, no bloating, feels like something is weighing down. No vaginal pressure. BM mostly qam, occasionally she will skip a couple of days. She is on pain medication for fibromyalgia intermittently and that causes issues with constipation.  Intermittent urinary hesitancy, nocturia x 3, voids normal to small amount. No urinary frequency, but occasional urgency (if very full). No leakage.  Sexually active, no discomfort     Patient's last menstrual period was 07/02/1994.          Sexually active: Yes.    The current method of family planning is status post hysterectomy.    Exercising: No.  The patient does not participate in regular exercise at present. Smoker:  no  Health Maintenance: Pap:  Patient is unsure History of abnormal Pap:  No MMG:  01-31-16 WNL, had one on 04/01/2017 will call for report from [facility] Colonoscopy:  05-18-13 WNL, f/u in 5 years BMD:   04-11-14 WNL  TDaP:  11-20-09  Gardasil: N/A    reports that she has never smoked. She has never used smokeless tobacco. She reports that she does not drink alcohol or use drugs. Mom is 95, she spends 1-2 weeks a month with her Mother. Does her meal prep for a month. 2 grown children, one in [place] and one in [place]. 2 step children.  4 grand children,  7 great grand children (between her and her husband). First husband died of CLL, he was her first love, [place] vet. Married to her second husband for 14 years.   Past Medical History:  Diagnosis Date  . Adenomatous colon polyp 10/1997, and 03/2009  . Allergic rhinitis   . Allergy   . Arthritis    osteo  . Asthma   . Chronic gastritis   . Fibromyalgia   . GERD (gastroesophageal reflux disease)   . Hemorrhoids   . Hyperlipidemia   . Hypertension   . Menorrhagia    partial hysterectomy  . Migraines   . PONV (postoperative nausea and vomiting)   . Sleep disturbance   . 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 CATARACT EXTRACTION Bilateral 10/16 & 11/16  . EXCISION METACARPAL MASS Right 11/16/2016   Procedure: RIGHT LITTLE FINGER CYST EXCISION;  Surgeon: Milly Jakob, MD;  Location: [place];  Service: Orthopedics;  Laterality: Right;  . TONSILLECTOMY  age 36  . tooth extract     with bone graft  . Turbinate sinus surgery  2003  . VAGINAL HYSTERECTOMY  1998   secondary to prolapse, ovaries remain    Current Outpatient Medications  Medication Sig Dispense Refill  . Ascorbic Acid (VITAMIN C PO) Take 1 tablet by mouth daily.    . Azelastine-Fluticasone 137-50 MCG/ACT SUSP Place 1 spray into the nose daily. 23 g 5  . betamethasone valerate ointment (VALISONE) 0.1 % Place a pea sized amount topically BID for one to weeks as needed. Not for daily long term use 30 g 0  . budesonide-formoterol (SYMBICORT) 160-4.5 MCG/ACT inhaler Inhale 2 puffs into the lungs 2 (two) times daily. 10.2 g 3  . CALCIUM PO Take 1 tablet by mouth daily.    . candesartan (ATACAND) 16 MG tablet  Take 8 mg by mouth at bedtime.     . cetirizine (ZYRTEC) 10 MG tablet Take 1 tablet (10 mg total) by mouth daily. One tab daily for allergies 30 tablet 1  . Cholecalciferol (VITAMIN D) 2000 units tablet Take 2,000 Units by mouth daily.    . Cyanocobalamin (VITAMIN B-12 PO) Take 1 tablet by mouth daily.    . diclofenac sodium (VOLTAREN) 1 % GEL Apply 3 grams to 3 large joints up to 3 times daily 3 Tube 3  . famotidine (PEPCID) 40 MG tablet Take 1 tablet (40 mg total) by mouth at bedtime. 30 tablet 11  . Lifitegrast (XIIDRA) 5 % SOLN Apply 1 drop to eye 2 (two) times daily.    . methocarbamol (ROBAXIN) 500 MG tablet Take 1 tablet (500 mg total) by mouth daily as needed for muscle  spasms. 90 tablet 0  . montelukast (SINGULAIR) 10 MG tablet TAKE 1 TABLET BY MOUTH ONCE DAILY 90 tablet 0  . Multiple Vitamin (MULTIVITAMIN) tablet Take 1 tablet by mouth daily. Calcium, Vitamin B-12, Vitamin A, Vitamin C    . nabumetone (RELAFEN) 500 MG tablet Take 1 tablet (500 mg total) by mouth 2 (two) times daily. 60 tablet 1  . pantoprazole (PROTONIX) 40 MG tablet Take 1 tablet (40 mg total) by mouth 2 (two) times daily before a meal. 60 tablet 11  . polyethylene glycol powder (GLYCOLAX/MIRALAX) powder Take 17 g by mouth daily. (Patient taking differently: Take 17 g by mouth daily as needed. ) 850 g 1  . pravastatin (PRAVACHOL) 20 MG tablet Take 20 mg by mouth daily.      Marland Kitchen Spacer/Aero-Holding Chambers (AEROCHAMBER PLUS) inhaler Use as instructed 1 each 2  . VITAMIN A PO Take by mouth daily.    Marland Kitchen zolmitriptan (ZOMIG-ZMT) 5 MG disintegrating tablet Take 1 tablet (5 mg total) by mouth as needed for migraine. 10 tablet 11  . zolpidem (AMBIEN) 5 MG tablet TAKE 1 TO 2 TABLETS BY MOUTH AT BEDTIME AS NEEDED 60 tablet 0   No current facility-administered medications for this visit.     Family History  Problem Relation Age of Onset  . Throat cancer Father   . Coronary artery disease Father   . Heart attack Father 60  . Stroke Father        during procedure  . Coronary artery disease Mother   . Hypertension Mother   . Hyperlipidemia Mother   . Pulmonary fibrosis Mother   . Lung disease Mother   . Prostate cancer Brother   . Heart disease Brother   . Diabetes Brother   . Diabetes Paternal Aunt        x 2  . Diabetes Paternal Grandmother   . Coronary artery disease Paternal Grandmother   . Stroke Maternal Grandmother        or MI  . Prostate cancer Paternal Uncle   . Migraines Daughter   . Migraines Daughter   . Colon cancer Neg Hx     Review of Systems  Constitutional: Negative.   HENT: Positive for sinus pressure.   Eyes: Negative.   Respiratory: Negative.   Cardiovascular:  Negative.   Gastrointestinal: Positive for constipation.  Endocrine: Negative.   Musculoskeletal:       Muscle and joint pain  Skin: Negative.   Allergic/Immunologic: Negative.   Neurological: Positive for headaches.  Hematological: Negative.   Psychiatric/Behavioral: Negative.     Exam:   BP 124/70 (BP Location: Right Arm, Patient Position: Sitting,  Cuff Size: Normal)   Pulse 60   Ht 5\' 3"  (1.6 m)   Wt 165 lb 9.6 oz (75.1 kg)   LMP 07/02/1994   BMI 29.33 kg/m   Weight change: @WEIGHTCHANGE @ Height:   Height: 5\' 3"  (160 cm)  Ht Readings from Last 3 Encounters:  03/01/18 5\' 3"  (1.6 m)  02/22/18 5\' 4"  (1.626 m)  12/09/17 5\' 4"  (1.626 m)    General appearance: alert, cooperative and appears stated age Head: Normocephalic, without obvious abnormality, atraumatic Neck: no adenopathy, supple, symmetrical, trachea midline and thyroid normal to inspection and palpation Lungs: clear to auscultation bilaterally Cardiovascular: regular rate and rhythm Breasts: normal appearance, no masses or tenderness Abdomen: soft, non-tender; non distended,  no masses,  no organomegaly Extremities: extremities normal, atraumatic, no cyanosis or edema Skin: Skin color, texture, turgor normal. No rashes or lesions Lymph nodes: Cervical, supraclavicular, and axillary nodes normal. No abnormal inguinal nodes palpated Neurologic: Grossly normal   Pelvic: External genitalia:  no lesions              Urethra:  normal appearing urethra with no masses, tenderness or lesions              Bartholins and Skenes: normal                 Vagina: normal appearing vagina with normal color and discharge, no lesions  She has a grade 2 cystocele, a grade 1-2 rectocele and a grade one vault prolapse. Patient examined supine and standing with and without valsalva.               Cervix: absent               Bimanual Exam:  Uterus:  uterus absent              Adnexa: no mass, fullness, tenderness                Rectovaginal: Confirms               Anus:  normal sphincter tone, no lesions  Chaperone was present for exam.  A:  Well Woman with normal exam  Lower abdominal pressure  Genital prolapse, cystocele is the most prominent     P:   No pap   Mammogram nex month  Colonoscopy in 1/20  DEXA UTD  Discussed breast self exam  Discussed calcium and vit D intake  Discussed kegels, discussed pessaries, if symptoms worsened surgery would be an option (not now)

## 2018-03-01 ENCOUNTER — Other Ambulatory Visit: Payer: Self-pay

## 2018-03-01 ENCOUNTER — Ambulatory Visit (INDEPENDENT_AMBULATORY_CARE_PROVIDER_SITE_OTHER): Payer: Medicare Other | Admitting: Obstetrics and Gynecology

## 2018-03-01 ENCOUNTER — Encounter: Payer: Self-pay | Admitting: Obstetrics and Gynecology

## 2018-03-01 VITALS — BP 124/70 | HR 60 | Ht 63.0 in | Wt 165.6 lb

## 2018-03-01 DIAGNOSIS — Z01419 Encounter for gynecological examination (general) (routine) without abnormal findings: Secondary | ICD-10-CM

## 2018-03-01 DIAGNOSIS — N8111 Cystocele, midline: Secondary | ICD-10-CM

## 2018-03-01 DIAGNOSIS — N816 Rectocele: Secondary | ICD-10-CM

## 2018-03-01 NOTE — Patient Instructions (Signed)
EXERCISE AND DIET:  We recommended that you start or continue a regular exercise program for good health. Regular exercise means any activity that makes your heart beat faster and makes you sweat.  We recommend exercising at least 30 minutes per day at least 3 days a week, preferably 4 or 5.  We also recommend a diet low in fat and sugar.  Inactivity, poor dietary choices and obesity can cause diabetes, heart attack, stroke, and kidney damage, among others.    ALCOHOL AND SMOKING:  Women should limit their alcohol intake to no more than 7 drinks/beers/glasses of wine (combined, not each!) per week. Moderation of alcohol intake to this level decreases your risk of breast cancer and liver damage. And of course, no recreational drugs are part of a healthy lifestyle.  And absolutely no smoking or even second hand smoke. Most people know smoking can cause heart and lung diseases, but did you know it also contributes to weakening of your bones? Aging of your skin?  Yellowing of your teeth and nails?  CALCIUM AND VITAMIN D:  Adequate intake of calcium and Vitamin D are recommended.  The recommendations for exact amounts of these supplements seem to change often, but generally speaking 1,200 mg of calcium (between diet and supplements) and 800 units of Vitamin D per day seems prudent. Certain women may benefit from higher intake of Vitamin D.  If you are among these women, your doctor will have told you during your visit.    PAP SMEARS:  Pap smears, to check for cervical cancer or precancers,  have traditionally been done yearly, although recent scientific advances have shown that most women can have pap smears less often.  However, every woman still should have a physical exam from her gynecologist every year. It will include a breast check, inspection of the vulva and vagina to check for abnormal growths or skin changes, a visual exam of the cervix, and then an exam to evaluate the size and shape of the uterus and  ovaries.  And after 68 years of age, a rectal exam is indicated to check for rectal cancers. We will also provide age appropriate advice regarding health maintenance, like when you should have certain vaccines, screening for sexually transmitted diseases, bone density testing, colonoscopy, mammograms, etc.   MAMMOGRAMS:  All women over 47 years old should have a yearly mammogram. Many facilities now offer a "3D" mammogram, which may cost around $50 extra out of pocket. If possible,  we recommend you accept the option to have the 3D mammogram performed.  It both reduces the number of women who will be called back for extra views which then turn out to be normal, and it is better than the routine mammogram at detecting truly abnormal areas.    COLONOSCOPY:  Colonoscopy to screen for colon cancer is recommended for all women at age 88.  We know, you hate the idea of the prep.  We agree, BUT, having colon cancer and not knowing it is worse!!  Colon cancer so often starts as a polyp that can be seen and removed at colonscopy, which can quite literally save your life!  And if your first colonoscopy is normal and you have no family history of colon cancer, most women don't have to have it again for 10 years.  Once every ten years, you can do something that may end up saving your life, right?  We will be happy to help you get it scheduled when you are ready.  Be sure to check your insurance coverage so you understand how much it will cost.  It may be covered as a preventative service at no cost, but you should check your particular policy.     Kegel Exercises Kegel exercises help strengthen the muscles that support the rectum, vagina, small intestine, bladder, and uterus. Doing Kegel exercises can help:  Improve bladder and bowel control.  Improve sexual response.  Reduce problems and discomfort during pregnancy.  Kegel exercises involve squeezing your pelvic floor muscles, which are the same muscles you  squeeze when you try to stop the flow of urine. The exercises can be done while sitting, standing, or lying down, but it is best to vary your position. Phase 1 exercises 1. Squeeze your pelvic floor muscles tight. You should feel a tight lift in your rectal area. If you are a female, you should also feel a tightness in your vaginal area. Keep your stomach, buttocks, and legs relaxed. 2. Hold the muscles tight for up to 10 seconds. 3. Relax your muscles. Repeat this exercise 50 times a day or as many times as told by your health care provider. Continue to do this exercise for at least 4-6 weeks or for as long as told by your health care provider. This information is not intended to replace advice given to you by your health care provider. Make sure you discuss any questions you have with your health care provider. Document Released: 04/05/2012 Document Revised: 12/13/2015 Document Reviewed: 03/09/2015 Elsevier Interactive Patient Education  2018 [employer]. About Rectocele  Overview  A rectocele is a type of hernia which causes different degrees of bulging of the rectal tissues into the vaginal wall.  You may even notice that it presses against the vaginal wall so much that some vaginal tissues droop outside of the opening of your vagina.  Causes of Rectocele  The most common cause is childbirth.  The muscles and ligaments in the pelvis that hold up and support the female organs and vagina become stretched and weakened during labor and delivery.  The more babies you have, the more the support tissues are stretched and weakened.  Not everyone who has a baby will develop a rectocele.  Some women have stronger supporting tissue in the pelvis and may not have as much of a problem as others.  Women who have a Cesarean section usually do not get rectocele's unless they pushed a long time prior to the cesarean delivery.  Other conditions that can cause a rectocele include chronic constipation, a  chronic cough, a lot of heavy lifting, and obesity.  Older women may have this problem because the loss of female hormones causes the vaginal tissue to become weaker.  Symptoms  There may not be any symptoms.  If you do have symptoms, they may include:  Pelvic pressure in the rectal area  Protrusion of the lower part of the vagina through the opening of the vagina  Constipation and trapping of the stool, making it difficult to have a bowel movement.  In severe cases, you may have to press on the lower part of your vagina to help push the stool out of you rectum.  This is called splinting to empty.  Diagnosing Rectocele  Your health care provider will ask about your symptoms and perform a pelvic exam.  S/he will ask you to bear down, pushing like you are having a bowel movement so as to see how far the lower part of the vagina protrudes into  the vagina and possible outside of the vagina.  Your provider will also ask you to contract the muscles of your pelvis (like you are stopping the stream in the middle of urinating) to determine the strength of your pelvic muscles.  Your provider may also do a rectal exam.  Treatment Options  If you do not have any symptoms, no treatment may be necessary.  Other treatment options include:  Pelvic floor exercises: Contracting the muscles in your genital area may help strengthen your muscles and support the organs.  Be sure to get proper exercise instruction from you physical therapist.  A pessary (removealbe pelvic support device) sometimes helps rectocele symptoms.  Surgery: Surgical repair may be necessary. In some cases the uterus may need to be taken out ( a hysterectomy) as well.  There are many types of surgery for pelvic support problems.  Look for physicians who specialize in repair procedures.  You can take care of yourself by:  Treating and preventing constipation  Avoiding heavy lifting, and lifting correctly (with your legs, not with you  waist or back)  Treating a chronic cough or bronchitis  Not smoking  avoiding too much weight gain  Doing pelvic floor exercises   2007, [employer] Doc.33About Cystocele  Overview  The pelvic organs, including the bladder, are normally supported by pelvic floor muscles and ligaments.  When these muscles and ligaments are stretched, weakened or torn, the wall between the bladder and the vagina sags or herniates causing a prolapse, sometimes called a cystocele.  This condition may cause discomfort and problems with emptying the bladder.  It can be present in various stages.  Some people are not aware of the changes.  Others may notice changes at the vaginal opening or a feeling of the bladder dropping outside the body.  Causes of a Cystocele  A cystocele is usually caused by muscle straining or stretching during childbirth.  In addition, cystocele is more common after menopause, because the hormone estrogen helps keep the elastic tissues around the pelvic organs strong.  A cystocele is more likely to occur when levels of estrogen decrease.  Other causes include: heavy lifting, chronic coughing, previous pelvic surgery and obesity.  Symptoms  A bladder that has dropped from its normal position may cause: unwanted urine leakage (stress incontinence), frequent urination or urge to urinate, incomplete emptying of the bladder (not feeling bladder relief after emptying), pain or discomfort in the vagina, pelvis, groin, lower back or lower abdomen and frequent urinary tract infections.  Mild cases may not cause any symptoms.  Treatment Options  Pelvic floor (Kegel) exercises:  Strength training the muscles in your genital area  Behavioral changes: Treating and preventing constipation, taking time to empty your bladder properly, learning to lift properly and/or avoid heavy lifting when possible, stopping smoking, avoiding weight gain and treating a chronic cough or bronchitis.  A  pessary: A vaginal support device is sometimes used to help pelvic support caused by muscle and ligament changes.  Surgery: Surgical repair may be necessary if symptoms cannot be managed with exercise, behavioral changes and a pessary.  Surgery is usually considered for severe cases.   2007, [employer]

## 2018-03-09 ENCOUNTER — Ambulatory Visit: Payer: Medicare Other | Admitting: Obstetrics and Gynecology

## 2018-03-14 DIAGNOSIS — R490 Dysphonia: Secondary | ICD-10-CM | POA: Diagnosis not present

## 2018-03-14 DIAGNOSIS — K219 Gastro-esophageal reflux disease without esophagitis: Secondary | ICD-10-CM | POA: Diagnosis not present

## 2018-03-14 DIAGNOSIS — I10 Essential (primary) hypertension: Secondary | ICD-10-CM | POA: Diagnosis not present

## 2018-03-22 DIAGNOSIS — R05 Cough: Secondary | ICD-10-CM | POA: Diagnosis not present

## 2018-03-22 DIAGNOSIS — F419 Anxiety disorder, unspecified: Secondary | ICD-10-CM | POA: Diagnosis not present

## 2018-03-22 DIAGNOSIS — J4541 Moderate persistent asthma with (acute) exacerbation: Secondary | ICD-10-CM | POA: Diagnosis not present

## 2018-04-05 DIAGNOSIS — Z1231 Encounter for screening mammogram for malignant neoplasm of breast: Secondary | ICD-10-CM | POA: Diagnosis not present

## 2018-04-13 ENCOUNTER — Ambulatory Visit (INDEPENDENT_AMBULATORY_CARE_PROVIDER_SITE_OTHER): Payer: Medicare Other | Admitting: Internal Medicine

## 2018-04-13 ENCOUNTER — Encounter: Payer: Self-pay | Admitting: Internal Medicine

## 2018-04-13 VITALS — BP 128/72 | HR 93 | Ht 64.0 in | Wt 167.4 lb

## 2018-04-13 DIAGNOSIS — J45991 Cough variant asthma: Secondary | ICD-10-CM

## 2018-04-13 MED ORDER — METHYLPREDNISOLONE ACETATE 80 MG/ML IJ SUSP
120.0000 mg | Freq: Once | INTRAMUSCULAR | Status: AC
  Administered 2018-04-13: 120 mg via INTRAMUSCULAR

## 2018-04-13 MED ORDER — MOMETASONE FURO-FORMOTEROL FUM 100-5 MCG/ACT IN AERO: 2.0000 | INHALATION_SPRAY | Freq: Two times a day (BID) | RESPIRATORY_TRACT | 11 refills | Status: DC

## 2018-04-13 MED ORDER — MOMETASONE FURO-FORMOTEROL FUM 100-5 MCG/ACT IN AERO: 2.0000 | INHALATION_SPRAY | Freq: Two times a day (BID) | RESPIRATORY_TRACT | 0 refills | Status: DC

## 2018-04-13 NOTE — Progress Notes (Signed)
Subjective:    Patient ID: Christina Deleon, female    DOB: 1951/01/13  MRN: [id number]     Brief patient profile:  24 yobf never smoker former pt of Dr Ashok Cordia and [facility] with refractory cough x 2014           04/13/2018  Initial ov/Damante Spragg consultation per Fuller Plan   re: cough x 2014 worse control x Spring 2019 / GI rec elevate bed bid ppi and hs h2 / Constance Holster (ent) rec avoid acei and arb  Chief Complaint  Patient presents with  . Pulmonary Consult    Referred by Dr. Fuller Plan. Pt states she has not felt well "all year". She c/o non prod cough and SOB.  She states she coughs alot at night.   She has occ wheezing and chest tightness.  She is using her albuterol inhaler 3-4 x per wk on average.   Spring of 2019 worsening  cough esp at hs > Kozlow changed meds > didn't work > changed back [facility]'s med > still not better and has not returned to [employer], seeing Fuller Plan for gerd documented on DgEs   Previous allergy shots no better/ also no better with singulair   Dyspnea:  Shopping with [employer]c parking due to fibromyalgia  Cough: seems better first thing in am then start coughing w/in first 81min of stirring assoc with subjective wheeze and chest tightness  Sleeping: if soundly sleeping does fine / sleeps on L side / bed is horizontal  SABA use: seems to help but makes her shaky  02: none Prednisone > all symptoms are gone "nearly 100%  Each time she gets a shot"    No obvious day to day or daytime variability or assoc excess/ purulent sputum or mucus plugs or hemoptysis or cp or   or overt sinus or hb symptoms.   Sleeping flat (no bed blocks) without nocturnal  or early am exacerbation  of respiratory  c/o's or need for noct saba. Also denies any obvious fluctuation of symptoms with weather or environmental changes or other aggravating or alleviating factors except as outlined above   No unusual exposure hx or h/o childhood pna/ asthma or knowledge of premature birth.  Current Allergies, Complete Past  Medical History, Past Surgical History, Family History, and Social History were reviewed in [employer] record.  ROS  The following are not active complaints unless bolded Hoarseness, sore throat, dysphagia, dental problems, itching, sneezing,  nasal congestion or discharge of excess mucus or purulent secretions, ear ache,   fever, chills, sweats, unintended wt loss or wt gain, classically pleuritic or exertional cp,  orthopnea pnd or arm/hand swelling  or leg swelling, presyncope, palpitations, abdominal pain, anorexia, nausea, vomiting, diarrhea  or change in bowel habits or change in bladder habits, change in stools or change in urine, dysuria, hematuria,  rash, arthralgias, visual complaints, headache, numbness, weakness or ataxia or problems with walking or coordination,  change in mood or  memory.        Current Meds  Medication Sig  . albuterol (PROAIR HFA) 108 (90 Base) MCG/ACT inhaler Inhale 2 puffs into the lungs every 6 (six) hours as needed for wheezing or shortness of breath.  . Ascorbic Acid (VITAMIN C PO) Take 1 tablet by mouth daily.  . Azelastine-Fluticasone 137-50 MCG/ACT SUSP Place 1 spray into the nose daily.  . betamethasone valerate ointment (VALISONE) 0.1 % Place a pea sized amount topically BID for one to weeks as needed. Not for daily  long term use  . budesonide-formoterol (SYMBICORT) 160-4.5 MCG/ACT inhaler Inhale 2 puffs into the lungs 2 (two) times daily.  Marland Kitchen CALCIUM PO Take 1 tablet by mouth daily.  . candesartan (ATACAND) 16 MG tablet Take 8 mg by mouth at bedtime.   . cetirizine (ZYRTEC) 10 MG tablet Take 1 tablet (10 mg total) by mouth daily. One tab daily for allergies  . Cholecalciferol (VITAMIN D) 2000 units tablet Take 2,000 Units by mouth daily.  . Cyanocobalamin (VITAMIN B-12 PO) Take 1 tablet by mouth daily.  . diclofenac sodium (VOLTAREN) 1 % GEL Apply 3 grams to 3 large joints up to 3 times daily  . famotidine (PEPCID) 40 MG tablet  Take 1 tablet (40 mg total) by mouth at bedtime.  Marland Kitchen Lifitegrast (XIIDRA) 5 % SOLN Apply 1 drop to eye 2 (two) times daily.  . methocarbamol (ROBAXIN) 500 MG tablet Take 1 tablet (500 mg total) by mouth daily as needed for muscle spasms.  . montelukast (SINGULAIR) 10 MG tablet TAKE 1 TABLET BY MOUTH ONCE DAILY  . Multiple Vitamin (MULTIVITAMIN) tablet Take 1 tablet by mouth daily. Calcium, Vitamin B-12, Vitamin A, Vitamin C  . nabumetone (RELAFEN) 500 MG tablet Take 1 tablet (500 mg total) by mouth 2 (two) times daily.  . pantoprazole (PROTONIX) 40 MG tablet Take 1 tablet (40 mg total) by mouth 2 (two) times daily before a meal.  . polyethylene glycol powder (GLYCOLAX/MIRALAX) powder Take 17 g by mouth daily. (Patient taking differently: Take 17 g by mouth daily as needed. )  . pravastatin (PRAVACHOL) 20 MG tablet Take 20 mg by mouth daily.    Marland Kitchen Spacer/Aero-Holding Chambers (AEROCHAMBER PLUS) inhaler Use as instructed  . VITAMIN A PO Take by mouth daily.  Marland Kitchen zolmitriptan (ZOMIG-ZMT) 5 MG disintegrating tablet Take 1 tablet (5 mg total) by mouth as needed for migraine.  Marland Kitchen zolpidem (AMBIEN) 5 MG tablet TAKE 1 TO 2 TABLETS BY MOUTH AT BEDTIME AS NEEDED               Objective:     amb bf  Quite aggravated with overall care/ pseudowheeze only   Wt Readings from Last 3 Encounters:  04/13/18 167 lb 6.4 oz (75.9 kg)  03/01/18 165 lb 9.6 oz (75.1 kg)  02/22/18 168 lb (76.2 kg)     Vital signs reviewed - Note on arrival 02 sats  100% on RA   HEENT: nl dentition, turbinates bilaterally, and oropharynx. Nl external ear canals without cough reflex   NECK :  without JVD/Nodes/TM/ nl carotid upstrokes bilaterally   LUNGS: no acc muscle use,  Nl contour chest which is clear to A and P bilaterally without cough on insp or exp maneuvers   CV:  RRR  no s3 or murmur or increase in P2, and no edema   ABD:  soft and nontender with nl inspiratory excursion in the supine position. No bruits or  organomegaly appreciated, bowel sounds nl  MS:  Nl gait/ ext warm without deformities, calf tenderness, cyanosis or clubbing No obvious joint restrictions   SKIN: warm and dry without lesions    NEURO:  alert, approp, nl sensorium with  no motor or cerebellar deficits apparent.             Assessment & Plan:

## 2018-04-13 NOTE — Patient Instructions (Addendum)
Stop symbicort 160  Depomedrol 120 mg IM today   Dulera 100 Take 2 puffs first thing in am and then another 2 puffs about 12 hours later.    Work on inhaler technique:  relax and gently blow all the way out then take a nice smooth deep breath back in, triggering the inhaler at same time you start breathing in.  Hold for up to 5 seconds if you can. Blow out thru nose. Brush teeth with arm and hammer toothpaste and gargle after use        Only use your albuterol as a rescue medication to be used if you can't catch your breath by resting or doing a relaxed purse lip breathing pattern.  - The less you use it, the better it will work when you need it. - Ok to use up to 2 puffs  every 4 hours if you must but call for immediate appointment if use goes up over your usual need - Don't leave home without it !!  (think of it like the spare tire for your car)   Continue protonix 40 mg Take 30- 60 min before your first and last meals of the day and pepcid 40 mg at bedtime  GERD (REFLUX)  is an extremely common cause of respiratory symptoms just like yours , many times with no obvious heartburn at all.    It can be treated with medication, but also with lifestyle changes including elevation of the head of your bed (ideally with 6 inch  bed blocks),  Smoking cessation, avoidance of late meals, excessive alcohol, and avoid fatty foods, chocolate, peppermint, colas, red wine, and acidic juices such as orange juice.  NO MINT OR MENTHOL PRODUCTS SO NO COUGH DROPS   USE SUGARLESS CANDY INSTEAD (Jolley ranchers or Stover's or Life Savers) or even ice chips will also do - the key is to swallow to prevent all throat clearing. NO OIL BASED VITAMINS - use powdered substitutes.   For drainage / throat tickle try take CHLORPHENIRAMINE  4 mg ([facility]ab [facility])  - take one every 4 hours as needed - available over the counter- may cause drowsiness so start with just a bedtime dose or two and see how you  tolerate it before trying in daytime     Please schedule a follow up office visit in 4 weeks, sooner if needed  with all medications /inhalers/ solutions in hand so we can verify exactly what you are taking. This includes all medications from all doctors and over the counters

## 2018-04-14 ENCOUNTER — Encounter: Payer: Self-pay | Admitting: Internal Medicine

## 2018-04-14 LAB — NITRIC OXIDE: NITRIC OXIDE: 10

## 2018-04-14 NOTE — Assessment & Plan Note (Addendum)
10/08/15 Allergy profile:  IgE: 149 RAST Panel: Negative - Dg Es 11/20/15 1. Small hiatal hernia with moderate spontaneous gastroesophageal reflux. 2. Barium pill passes into the stomach without delay. 3. Moderate tertiary contractions in the mid and distal esophagus Sinus CT 06/27/17 :   nl   Spirometry 04/13/2018  FEV1 1.71 (89%)  Ratio 78 s curvature while symptomatic  FENO 04/13/2018  =   10  - 04/13/2018  After extensive coaching inhaler device,  effectiveness =    50% from a baseline of 25% HFA so try dulera 100 2bid instead of symb 80 as favor uacs > asthma   Very strongly favor uacs over asthma despite reported response to prednisone in past based on spirometry/ feno today during flare and absence of noct cough/ wheeze.   Upper airway cough syndrome (previously labeled PNDS),  is so named because it's frequently impossible to sort out how much is  CR/sinusitis with freq throat clearing (which can be related to primary GERD)   vs  causing  secondary (" extra esophageal")  GERD from wide swings in gastric pressure that occur with throat clearing, often  promoting self use of mint and menthol lozenges that reduce the lower esophageal sphincter tone and exacerbate the problem further in a cyclical fashion.   These are the same pts (now being labeled as having "irritable larynx syndrome" by some cough centers) who not infrequently have a history of having failed to tolerate ace inhibitors,  dry powder inhalers or biphosphonates or report having atypical/extraesophageal reflux symptoms that don't respond to standard doses of PPI  and are easily confused as having aecopd or asthma flares by even experienced allergists/ pulmonologists (myself included).   Of the three most common causes of  Sub-acute / recurrent or chronic cough, only one (GERD, which we know she has as evidenced by DgEs above)  can actually contribute to/ trigger  the other two (asthma and post nasal drip syndrome)  and perpetuate the  cylce of cough.  While not intuitively obvious, many patients with chronic low grade reflux do not cough until there is a primary insult that disturbs the protective epithelial barrier and exposes sensitive nerve endings.   This is typically viral but can due to PNDS and  either may apply here.   The point is that once this occurs, it is difficult to eliminate the cycle  using anything but a maximally effective acid suppression regimen at least in the short run, accompanied by an appropriate diet to address non acid GERD and control / eliminate all pnds with 1st gen H1 blockers per guidelines  And then if not able to stop coughing either add a short term course of tramadol or longterm rx with gabapentin when returns.   Also may consider MCT if proves to respond to "prednisone shot" for a week  (which we repeated today) , as has happened in the past but may have been obscured by use of cough suppression which I did not offer today.   Advised pt and husband The standardized cough guidelines published in Chest by Lissa Morales in 2006 are still the best available and consist of a multiple step process (up to 12!) , not a single office visit,  and are intended  to address this problem logically,  with an alogrithm dependent on response to empiric treatment at  each progressive step  to determine a specific diagnosis with  minimal addtional testing needed. Therefore if adherence is an issue or can't be  accurately verified,  it's very unlikely the standard evaluation and treatment will be successful here.    Furthermore, response to therapy (other than acute cough suppression, which should only be used short term with avoidance of narcotic containing cough syrups if possible), can be a gradual process for which the patient is not likely to  perceive immediate benefit.  Unlike going to an eye doctor where the best perscription is almost always the first one and is immediately effective, this is almost never the  case in the management of chronic cough syndromes. Therefore the patient needs to commit up front to consistently adhere to recommendations  for up to 6 weeks of therapy directed at the likely underlying problem(s) before the response can be reasonably evaluated.    >>>   In 4 weeks with all meds in hand using a trust but verify approach to confirm accurate Medication  Reconciliation The principal here is that until we are certain that the  patients are doing what we've asked, it makes no sense to ask them to do more.     Total time devoted to counseling  > 50 % of initial 60 min office visit:  review case with pt/husband plusdevice teaching which extended face to face time for this visit/   discussion of options/alternatives/ personally creating written customized instructions  in presence of pt  then going over those specific  Instructions directly with the pt including how to use all of the meds but in particular covering each new medication in detail and the difference between the maintenance= "automatic" meds and the prns using an action plan format for the latter (If this problem/symptom => do that organization reading Left to right).  Please see AVS from this visit for a full list of these instructions which I personally wrote for this pt and  are unique to this visit.

## 2018-04-17 ENCOUNTER — Ambulatory Visit: Payer: Medicare Other | Admitting: Rheumatology

## 2018-04-18 ENCOUNTER — Telehealth: Payer: Self-pay | Admitting: Internal Medicine

## 2018-04-18 ENCOUNTER — Encounter: Payer: Self-pay | Admitting: Obstetrics and Gynecology

## 2018-04-18 NOTE — Telephone Encounter (Signed)
Called and left message to call back.

## 2018-04-18 NOTE — Telephone Encounter (Signed)
Pt is calling back [phone]

## 2018-04-18 NOTE — Telephone Encounter (Signed)
Attempted to call pt but unable to reach her. Left message for pt to return call. 

## 2018-04-19 ENCOUNTER — Ambulatory Visit: Payer: Medicare Other | Admitting: Gastroenterology

## 2018-04-19 NOTE — Telephone Encounter (Signed)
Patient returned phone call; pt contact # [phone]

## 2018-04-19 NOTE — Telephone Encounter (Signed)
Called [facility] pharmacy-confirmed that Christina Deleon; pharmacy is faxing over PA information to me directly. Will work on it asap when faxed received.

## 2018-04-19 NOTE — Telephone Encounter (Signed)
Attempted to call pt but unable to reach her.left message for pt to return call.

## 2018-04-20 NOTE — Telephone Encounter (Signed)
I have checked the PA folder and MW's box, it does not appear that PA request has been received. Contact [facility] and requested that PA request for [facility] be faxed to our office.  Will leave message in triage until PA is received.

## 2018-04-21 ENCOUNTER — Other Ambulatory Visit: Payer: Self-pay | Admitting: Physician Assistant

## 2018-04-21 ENCOUNTER — Telehealth: Payer: Self-pay | Admitting: Rheumatology

## 2018-04-21 MED ORDER — MOMETASONE FURO-FORMOTEROL FUM 100-5 MCG/ACT IN AERO: 2.0000 | INHALATION_SPRAY | Freq: Two times a day (BID) | RESPIRATORY_TRACT | 0 refills | Status: DC

## 2018-04-21 NOTE — Telephone Encounter (Signed)
advair 115 2bid

## 2018-04-21 NOTE — Telephone Encounter (Signed)
Patient calling for update, advised her per notes we had not recd PA as of yesterday am and have reached out to pharmacy to send again.  She is worried this will not be taken care of before she leaves to go out of town and is requesting to speak to nurse. CB is [phone].

## 2018-04-21 NOTE — Telephone Encounter (Signed)
Called patient, unable to reach. Left message to give us a call back.  

## 2018-04-21 NOTE — Telephone Encounter (Signed)
[facility] PA has been denied  They prefer Breo, Advair HFA or diskus, or Airduo  Pt has f/u planned 05/15/18   Last AVS:  Stop symbicort 160   Depomedrol 120 mg IM today    Dulera 100 Take 2 puffs first thing in am and then another 2 puffs about 12 hours later.      Work on inhaler technique:  relax and gently blow all the way out then take a nice smooth deep breath back in, triggering the inhaler at same time you start breathing in.  Hold for up to 5 seconds if you can. Blow out thru nose. Brush teeth with arm and hammer toothpaste and gargle after use          Only use your albuterol as a rescue medication to be used if you can't catch your breath by resting or doing a relaxed purse lip breathing pattern.  - The less you use it, the better it will work when you need it. - Ok to use up to 2 puffs  every 4 hours if you must but call for immediate appointment if use goes up over your usual need - Don't leave home without it !!  (think of it like the spare tire for your car)    Continue protonix 40 mg Take 30- 60 min before your first and last meals of the day and pepcid 40 mg at bedtime   GERD (REFLUX)  is an extremely common cause of respiratory symptoms just like yours , many times with no obvious heartburn at all.     It can be treated with medication, but also with lifestyle changes including elevation of the head of your bed (ideally with 6 inch  bed blocks),  Smoking cessation, avoidance of late meals, excessive alcohol, and avoid fatty foods, chocolate, peppermint, colas, red wine, and acidic juices such as orange juice.  NO MINT OR MENTHOL PRODUCTS SO NO COUGH DROPS   USE SUGARLESS CANDY INSTEAD (Jolley ranchers or Stover's or Life Savers) or even ice chips will also do - the key is to swallow to prevent all throat clearing. NO OIL BASED VITAMINS - use powdered substitutes.   For drainage / throat tickle try take CHLORPHENIRAMINE  4 mg ([facility]ab [facility])  - take one every  4 hours as needed - available over the counter- may cause drowsiness so start with just a bedtime dose or two and see how you tolerate it before trying in daytime       Please schedule a follow up office visit in 4 weeks, sooner if needed  with all medications /inhalers/ solutions in hand so we can verify exactly what you are taking. This includes all medications from all doctors and over the counters

## 2018-04-21 NOTE — Telephone Encounter (Signed)
A prescription refill has not been received on this patient until today.

## 2018-04-21 NOTE — Telephone Encounter (Signed)
Received the PA request for pt's Dulera 100. PA was initiated via cmm  Medication name and strength: dulera 100 Provider: wert [facility]acy[facility] Patient insurance ID: [id number] Phone: [phone] Fax: [phone]  Was the PA started on CMM?  yes If yes, please enter the Key: [id number] Timeframe for approval/denial: could take up to 72 hours to receive approval/denial  Called and spoke with pt letting her know that I had initiated the PA. Due to the time it could take to get a response, pt is asking for a sample.  Sample has been placed up front for pt. Called and spoke with pt letting her know that the sample was placed up front for her due to the PA to be approx 72 hours before we receive a response. Pt expressed understanding.  Keeping encounter open until PA response has been received.

## 2018-04-21 NOTE — Telephone Encounter (Signed)
Last Visit: 12/09/17 Next Visit: 06/13/18  Last Fill: 02/06/18  Okay to refill Ambien?

## 2018-04-21 NOTE — Telephone Encounter (Signed)
Patient called requesting prescription refill of Zolpidem to be sent to [facility] on [address].  Patient states she only has 2 pills remaining and will be going out of town this evening.  Patient is requesting the prescription be sent in before 3:00 today.  Patient states [facility] told her that they have faxed over a prescription refill request "several times with no response."

## 2018-04-25 MED ORDER — MOMETASONE FURO-FORMOTEROL FUM 100-5 MCG/ACT IN AERO: 2.0000 | INHALATION_SPRAY | Freq: Two times a day (BID) | RESPIRATORY_TRACT | 0 refills | Status: DC

## 2018-04-25 NOTE — Telephone Encounter (Signed)
Sample of dulera 100 until we can sort this out > will need ov before the sample is out and can try the pa process

## 2018-04-25 NOTE — Telephone Encounter (Signed)
They all cause that - what I would first try is just one puff qd  of what I already rec and if tol then build up to one bid   In any case need to regroup after holidays with all meds in hand

## 2018-04-25 NOTE — Telephone Encounter (Signed)
Spoke with patient, she is aware of MWs recs. She is still insisting on an appeal for the [facility]. She stated that she has tried the Advair before and did it not work. She is not willing pay "over $100 for a medication that did not work in the past." She is unsure of which doctor took her off of the [place].   MW, please advise. Thanks!

## 2018-04-25 NOTE — Telephone Encounter (Signed)
Spoke with patient-states inhalers cause her to have shakiness, increased heart rate as well. Also states she has tried Advair in the past-did not help. Wert, please advise on what other inhaler to give-Breo or Airduo. Thanks.

## 2018-04-25 NOTE — Telephone Encounter (Signed)
Called and spoke with Patient.  Dr. Melvyn Novas recommendations given. Dulera sample placed at desk for pick up.  Patient has upcoming appointment 05/15/17 with Dr. Melvyn Novas, and she stated that she still has 1 sample of [facility], so with this sample she is going to pick up, she will be ok until OV. Nothing further at this time.

## 2018-05-15 ENCOUNTER — Ambulatory Visit (INDEPENDENT_AMBULATORY_CARE_PROVIDER_SITE_OTHER): Payer: Medicare Other | Admitting: Internal Medicine

## 2018-05-15 ENCOUNTER — Encounter: Payer: Self-pay | Admitting: Internal Medicine

## 2018-05-15 VITALS — BP 124/70 | HR 84 | Ht 64.0 in | Wt 166.0 lb

## 2018-05-15 DIAGNOSIS — J45991 Cough variant asthma: Secondary | ICD-10-CM

## 2018-05-15 MED ORDER — BUDESONIDE-FORMOTEROL FUMARATE 80-4.5 MCG/ACT IN AERO: INHALATION_SPRAY | RESPIRATORY_TRACT | 11 refills | Status: DC

## 2018-05-15 NOTE — Patient Instructions (Signed)
Symbicort 80  Take 2 puffs first thing in am and then another 2 puffs about 12 hours later.   Work on inhaler technique:  relax and gently blow all the way out then take a nice smooth deep breath back in, triggering the inhaler at same time you start breathing in.  Hold for up to 5 seconds if you can. Blow symbicort  out thru nose. Rinse and gargle with water when done      Only use your albuterol as a rescue medication to be used if you can't catch your breath by resting or doing a relaxed purse lip breathing pattern.  - The less you use it, the better it will work when you need it. - Ok to use up to 2 puffs  every 4 hours if you must but call for immediate appointment if use goes up over your usual need - Don't leave home without it !!  (think of it like the spare tire for your car)    For drainage / throat tickle try take CHLORPHENIRAMINE  4 mg ([facility] = chlortab) - take one every 4 hours as needed - available over the counter- may cause drowsiness so start with just a  dose or two an hour before bedtime  and see how you tolerate it before trying in daytime      Please schedule a follow up visit in 3 months but call sooner if needed  with all medications /inhalers/ solutions in hand so we can verify exactly what you are taking. This includes all medications from all doctors and over the counters

## 2018-05-15 NOTE — Progress Notes (Signed)
Subjective:    Patient ID: Christina Deleon, female    DOB: [birth year]  MRN: [id number]     Brief patient profile:  26 yobf never smoker former pt of Dr Ashok Cordia and [facility] with refractory cough x 2014           04/13/2018  Initial ov/Wert consultation per Fuller Plan   re: cough x 2014 worse control x Spring 2019 / GI rec elevate bed bid ppi and hs h2 / Constance Holster (ent) rec avoid acei and arb  Chief Complaint  Patient presents with  . Pulmonary Consult    Referred by Dr. Fuller Plan. Pt states she has not felt well "all year". She c/o non prod cough and SOB.  She states she coughs alot at night.   She has occ wheezing and chest tightness.  She is using her albuterol inhaler 3-4 x per wk on average.   Spring of 2019 worsening  cough esp at hs > [facility]low changed meds > didn't work > changed back [facility]'s med > still not better and has not returned to [employer], seeing Fuller Plan for gerd documented on DgEs  Previous allergy shots no better/ ?  better with singulair  Dyspnea:  Shopping with hc parking due to fibromyalgia  Cough: seems better first thing in am then start coughing w/in first 24min of stirring assoc with subjective wheeze and chest tightness  Sleeping: if soundly sleeping does fine / sleeps on L side / bed is horizontal  SABA use: seems to help but makes her shaky  02: none Prednisone > all symptoms are gone "nearly 100%  Each time she gets a shot" rec Stop symbicort 160 Depomedrol 120 mg IM today  Dulera 100 Take 2 puffs first thing in am and then another 2 puffs about 12 hours later.  Work on inhaler technique:    Only use your albuterol as a rescue medication  Continue protonix 40 mg Take 30- 60 min before your first and last meals of the day and pepcid 40 mg at bedtime GERD diet   For drainage / throat tickle try take CHLORPHENIRAMINE  4 mg (chlortab 4 mg at [facility])  - take one every 4 hours as needed -  Please schedule a follow up office visit in 4 weeks, sooner if needed  with all  medications /inhalers/ solutions in hand so we can verify exactly what you are taking. This includes all medications from all doctors and over the counters    05/15/2018  f/u ov/Wert re: coughx 2014 / did not bring meds as req   Chief Complaint  Patient presents with  . Follow-up    Her cough and SOB have improved some. She has used her proair 1 or 2 x since her last visit.   Dyspnea:  Not limited by breathing from desired activities  Unless coughing Cough: after stirs in am and in pm before bed  Sleeping: ok on flat bed/ wedge pillow maybe 30 degrees elevation  SABA use: rarely proair while on dulera 100 bid  02: none    No obvious day to day or daytime variability or assoc excess/ purulent sputum or mucus plugs or hemoptysis or cp or chest tightness, subjective wheeze or overt sinus or hb symptoms.   Sleep ok  without nocturnal  or early am exacerbation  of respiratory  c/o's or need for noct saba. Also denies any obvious fluctuation of symptoms with weather or environmental changes or other aggravating or alleviating factors except as outlined above  No unusual exposure hx or h/o childhood pna/ asthma or knowledge of premature birth.  Current Allergies, Complete Past Medical History, Past Surgical History, Family History, and Social History were reviewed in [employer] [facility] record.  ROS  The following are not active complaints unless bolded Hoarseness, sore throat, dysphagia, dental problems, itching, sneezing,  nasal congestion or discharge of excess mucus or purulent secretions, ear ache,   fever, chills, sweats, unintended wt loss or wt gain, classically pleuritic or exertional cp,  orthopnea pnd or arm/hand swelling  or leg swelling, presyncope, palpitations, abdominal pain, anorexia, nausea, vomiting, diarrhea  or change in bowel habits or change in bladder habits, change in stools or change in urine, dysuria, hematuria,  rash, arthralgias, visual complaints,  headache, numbness, weakness or ataxia or problems with walking or coordination,  change in mood or  memory.        Current Meds - - NOTE:   Unable to verify as accurately reflecting what pt takes     Medication Sig  . albuterol (PROAIR HFA) 108 (90 Base) MCG/ACT inhaler Inhale 2 puffs into the lungs every 6 (six) hours as needed for wheezing or shortness of breath.  . Ascorbic Acid (VITAMIN C PO) Take 1 tablet by mouth daily.  . Azelastine-Fluticasone 137-50 MCG/ACT SUSP Place 1 spray into the nose daily.  . betamethasone valerate ointment (VALISONE) 0.1 % Place a pea sized amount topically BID for one to weeks as needed. Not for daily long term use  . CALCIUM PO Take 1 tablet by mouth daily.  . candesartan (ATACAND) 16 MG tablet Take 8 mg by mouth at bedtime.   . cetirizine (ZYRTEC) 10 MG tablet Take 1 tablet (10 mg total) by mouth daily. One tab daily for allergies  . Cholecalciferol (VITAMIN D) 2000 units tablet Take 2,000 Units by mouth daily.  . Cyanocobalamin (VITAMIN B-12 PO) Take 1 tablet by mouth daily.  . diclofenac sodium (VOLTAREN) 1 % GEL Apply 3 grams to 3 large joints up to 3 times daily  . famotidine (PEPCID) 40 MG tablet Take 1 tablet (40 mg total) by mouth at bedtime.  Marland Kitchen Lifitegrast (XIIDRA) 5 % SOLN Apply 1 drop to eye 2 (two) times daily.  . methocarbamol (ROBAXIN) 500 MG tablet Take 1 tablet (500 mg total) by mouth daily as needed for muscle spasms.  . mometasone-formoterol (DULERA) 100-5 MCG/ACT AERO Inhale 2 puffs into the lungs 2 (two) times daily.  . montelukast (SINGULAIR) 10 MG tablet TAKE 1 TABLET BY MOUTH ONCE DAILY  . Multiple Vitamin (MULTIVITAMIN) tablet Take 1 tablet by mouth daily. Calcium, Vitamin B-12, Vitamin A, Vitamin C  . nabumetone (RELAFEN) 500 MG tablet Take 1 tablet (500 mg total) by mouth 2 (two) times daily.  . pantoprazole (PROTONIX) 40 MG tablet Take 1 tablet (40 mg total) by mouth 2 (two) times daily before a meal.  . polyethylene glycol  powder (GLYCOLAX/MIRALAX) powder Take 17 g by mouth daily. (Patient taking differently: Take 17 g by mouth daily as needed. )  . pravastatin (PRAVACHOL) 20 MG tablet Take 20 mg by mouth daily.    Marland Kitchen VITAMIN A PO Take by mouth daily.  Marland Kitchen zolmitriptan (ZOMIG-ZMT) 5 MG disintegrating tablet Take 1 tablet (5 mg total) by mouth as needed for migraine.  Marland Kitchen zolpidem (AMBIEN) 5 MG tablet TAKE 1 TO 2 TABLETS BY MOUTH AT BEDTIME AS NEEDED  Objective:     amb bf nad    05/15/2018         166  04/13/18 167 lb 6.4 oz (75.9 kg)  03/01/18 165 lb 9.6 oz (75.1 kg)  02/22/18 168 lb (76.2 kg)     Vital signs reviewed - Note on arrival 02 sats  100% on RA   HEENT: nl dentition, turbinates bilaterally, and oropharynx. Nl external ear canals without cough reflex   NECK :  without JVD/Nodes/TM/ nl carotid upstrokes bilaterally   LUNGS: no acc muscle use,  Nl contour chest which is clear to A and P bilaterally without cough on insp or exp maneuvers   CV:  RRR  no s3 or murmur or increase in P2, and no edema   ABD:  soft and nontender with nl inspiratory excursion in the supine position. No bruits or organomegaly appreciated, bowel sounds nl  MS:  Nl gait/ ext warm without deformities, calf tenderness, cyanosis or clubbing No obvious joint restrictions   SKIN: warm and dry without lesions    NEURO:  alert, approp, nl sensorium with  no motor or cerebellar deficits apparent.              Assessment & Plan:

## 2018-05-19 ENCOUNTER — Ambulatory Visit (INDEPENDENT_AMBULATORY_CARE_PROVIDER_SITE_OTHER): Payer: Medicare Other | Admitting: Gastroenterology

## 2018-05-19 ENCOUNTER — Encounter: Payer: Self-pay | Admitting: Internal Medicine

## 2018-05-19 ENCOUNTER — Encounter: Payer: Self-pay | Admitting: Gastroenterology

## 2018-05-19 VITALS — BP 130/72 | HR 86 | Ht 64.0 in | Wt 168.6 lb

## 2018-05-19 DIAGNOSIS — R490 Dysphonia: Secondary | ICD-10-CM | POA: Diagnosis not present

## 2018-05-19 DIAGNOSIS — R05 Cough: Secondary | ICD-10-CM | POA: Diagnosis not present

## 2018-05-19 DIAGNOSIS — K219 Gastro-esophageal reflux disease without esophagitis: Secondary | ICD-10-CM

## 2018-05-19 DIAGNOSIS — R0989 Other specified symptoms and signs involving the circulatory and respiratory systems: Secondary | ICD-10-CM

## 2018-05-19 DIAGNOSIS — R059 Cough, unspecified: Secondary | ICD-10-CM

## 2018-05-19 MED ORDER — ESOMEPRAZOLE MAGNESIUM 40 MG PO CPDR: 40.0000 mg | DELAYED_RELEASE_CAPSULE | Freq: Two times a day (BID) | ORAL | 11 refills | Status: DC

## 2018-05-19 NOTE — Progress Notes (Signed)
    History of Present Illness: This is a 68 year old female with persistent cough, hoarseness, globus sensation.  She was evaluated by Dr. Constance Holster, ENT, questioned candesartan side effect, and evaluated by Dr. Melvyn Novas who felt she had upper airway cough syndrome and irritable larynx syndrome.  GERD and LPR are likely contributing but she has not had a good response to high-level acid suppression with a PPI twice daily and an H2 RA at night leading me to conclude there are other factors.  Current Medications, Allergies, Past Medical History, Past Surgical History, Family History and Social History were reviewed in [employer] record.  Physical Exam: General: Well developed, well nourished, no acute distress Head: Normocephalic and atraumatic Eyes:  sclerae anicteric, EOMI Ears: Normal auditory acuity Mouth: No deformity or lesions Lungs: Clear throughout to auscultation Heart: Regular rate and rhythm; no murmurs, rubs or bruits Abdomen: Soft, non tender and non distended. No masses, hepatosplenomegaly or hernias noted. Normal Bowel sounds Rectal: Not done Musculoskeletal: Symmetrical with no gross deformities  Pulses:  Normal pulses noted Extremities: No clubbing, cyanosis, edema or deformities noted Neurological: Alert oriented x 4, grossly nonfocal Psychological:  Alert and cooperative. Normal mood and affect   Assessment and Recommendations:  1. GERD with LPR.  Multifactorial problem with upper airway cough syndrome and irritable larynx syndrome.  Patient is advised to follow recommendations provided by Dr. Melvyn Novas.  Will attempt to obtain better acid reflux control all no intensive antireflux therapy with pantoprazole 40 mg twice daily and famotidine at bedtime has only led to a modest improvement in symptoms which leads me to conclude that there are other factors involved.  Trial of Nexium 40 mg twice daily in place of pantoprazole.  Continue famotidine 40 mg at bedtime.  REV in 3 months.   2.  Personal history of adenomatous colon polyps.  Surveillance colonoscopy is due in November 2020.  I spent 15 minutes of face-to-face time with the patient. Greater than 50% of the time was spent counseling and coordinating care.

## 2018-05-19 NOTE — Patient Instructions (Signed)
We have sent the following medications to your pharmacy for you to pick up at your convenience: Nexium 40 mg one tablet by mouth twice daily. This will replace your Protonix prescription.   Thank you for choosing me and [facility].  Pricilla Riffle. Dagoberto Ligas., MD., Marval Regal

## 2018-05-19 NOTE — Assessment & Plan Note (Addendum)
Onset of cough 2014  10/08/15 Allergy profile:  IgE: 149 RAST Panel: Negative - Dg Es 11/20/15 . Small hiatal hernia with moderate spontaneous gastroesophageal reflux. Sinus CT 06/27/17 nl  Spirometry 04/13/2018  FEV1 1.71 (89%)  Ratio 78 s curvature while symptomatic  FENO 04/13/2018  =   10  - 04/13/2018    try dulera 100 2bid instead of symb 160 as favor uacs > asthma  - 05/15/2018  After extensive coaching inhaler device,  effectiveness =    75% - Added 1st gen H1 blockers per guidelines      I suspect she has components of asthma and uacs so best to cont the lower strength and use hfa >dpi here  Advised pt: Advised:  formulary restrictions will be an ongoing challenge for the forseable future and I would be happy to pick an alternative if the pt will first  provide me a list of them -  pt  will need to return here for training for any new device that is required eg dpi vs hfa vs respimat.    In the meantime we can always provide samples so that the patient never runs out of any needed respiratory medications.    I had an extended discussion with the patient reviewing all relevant studies completed to date and  lasting 15 to 20 minutes of a 25 minute visit    See device teaching which extended face to face time for this visit.  Each maintenance medication was reviewed in detail including emphasizing most importantly the difference between maintenance and prns and under what circumstances the prns are to be triggered using an action plan format that is not reflected in the computer generated alphabetically organized AVS which I have not found useful in most complex patients, especially with respiratory illnesses  Please see AVS for specific instructions unique to this visit that I personally wrote and verbalized to the the pt in detail and then reviewed with pt  by my nurse highlighting any  changes in therapy recommended at today's visit to their plan of care.

## 2018-05-22 ENCOUNTER — Other Ambulatory Visit: Payer: Self-pay | Admitting: *Deleted

## 2018-05-22 MED ORDER — MONTELUKAST SODIUM 10 MG PO TABS: ORAL_TABLET | ORAL | 0 refills | Status: DC

## 2018-05-30 NOTE — Progress Notes (Signed)
Office Visit Note  Patient: Christina Deleon             Date of Birth: [birth year]           MRN: [id number]             PCP: Lanice Shirts, MD Referring: Lanice Shirts, * Visit Date: 06/13/2018 Occupation: @GUAROCC @  Subjective:  Myalgias   History of Present Illness: Christina Deleon is a 68 y.o. female with history of fibromyalgia, osteoarthritis, and DDD.  She is taking Relafen 500 mg BID PRN and Ambien 5 mg po at bedtime for insomnia. She takes Robaxin 500 mg 1 tablet PRN for muscle spasms.  She has been having worsening fibromyalgia flares.  She has been under a tremendous amount of stress.  She has generalized muscle aches and muscle tenderness due to fibromyalgia.  She has been having more frequent lower extremity muscle cramps.  She has not been for massage recently.  She has been having trapezius muscle tension and tenderness.  She has chronic pain in both knee joints.  She states the pain is worse when going down steps. She reports intermittent hand swelling.  She has a mucin cyst on left 4th digit.   Activities of Daily Living:  Patient reports morning stiffness for 4 hours.   Patient Reports nocturnal pain.  Difficulty dressing/grooming: Denies Difficulty climbing stairs: Reports Difficulty getting out of chair: Reports Difficulty using hands for taps, buttons, cutlery, and/or writing: Reports  Review of Systems  Constitutional: Positive for fatigue.  HENT: Positive for mouth dryness. Negative for mouth sores and nose dryness.   Eyes: Positive for dryness. Negative for pain and visual disturbance.  Respiratory: Negative for cough, hemoptysis, shortness of breath and difficulty breathing.   Cardiovascular: Positive for swelling in legs/feet. Negative for chest pain, palpitations and hypertension.  Gastrointestinal: Positive for constipation. Negative for blood in stool and diarrhea.  Endocrine: Negative for increased urination.  Genitourinary: Negative for  difficulty urinating and painful urination.  Musculoskeletal: Positive for arthralgias, gait problem, joint pain, joint swelling, muscle weakness, morning stiffness and muscle tenderness. Negative for myalgias and myalgias.  Skin: Positive for rash. Negative for color change, pallor, hair loss, nodules/bumps, skin tightness, ulcers and sensitivity to sunlight.  Allergic/Immunologic: Negative for susceptible to infections.  Neurological: Positive for headaches.  Hematological: Negative for bruising/bleeding tendency and swollen glands.  Psychiatric/Behavioral: Positive for sleep disturbance. Negative for depressed mood. The patient is not nervous/anxious.     PMFS History:  Patient Active Problem List   Diagnosis Date Noted  . Cough variant asthma vs uacs  04/13/2018  . Fibromyalgia 04/19/2016  . Other fatigue 04/19/2016  . 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 Primary osteoarthritis of both knees 04/19/2016  . Leg pain 01/22/2016  . Restless legs syndrome 01/22/2016  . Snoring 11/19/2015  . Periodic limb movement sleep disorder 11/19/2015  . GERD (gastroesophageal reflux disease) 10/08/2015  . Mild persistent asthma 04/09/2015  . LPRD (laryngopharyngeal reflux disease) 04/09/2015  . Allergic rhinoconjunctivitis 04/09/2015  . HEMATOCHEZIA 04/01/2010  . COUGH 04/01/2010  . GASTRITIS 09/30/2009  . NAUSEA 09/30/2009  . ABDOMINAL PAIN-EPIGASTRIC 09/30/2009  . DYSTHYMIC DISORDER 09/26/2009  . PALPITATIONS 06/17/2009  . Impingement syndrome of shoulder, left 06/04/2009  . CONSTIPATION 02/05/2009  . PERSONAL HX COLONIC POLYPS 02/05/2009  . ASTHMA, PERSISTENT, MILD 02/04/2009  . CHEST PAIN, ATYPICAL 02/04/2009  . COLONIC POLYPS, BENIGN 12/18/2008  . HYPERLIPIDEMIA 12/18/2008  . Insomnia 12/18/2008  . MIGRAINE, COMMON 12/18/2008  .  HYPERTENSION 12/18/2008  . ALLERGIC RHINITIS 12/18/2008  . Primary osteoarthritis of both hands 12/18/2008  . DJD (degenerative joint disease), cervical  12/18/2008  . NECK PAIN, CHRONIC 12/18/2008  . LOW BACK PAIN, CHRONIC 12/18/2008    Past Medical History:  Diagnosis Date  . Adenomatous colon polyp 10/1997, and 03/2009  . Allergic rhinitis   . Allergy   . Arthritis    osteo  . Asthma   . Chronic gastritis   . Fibromyalgia   . GERD (gastroesophageal reflux disease)   . Hemorrhoids   . Hyperlipidemia   . Hypertension   . Menorrhagia    partial hysterectomy  . Migraines   . PONV (postoperative nausea and vomiting)   . Sleep disturbance   . STD (sexually transmitted disease) 01/22/11 culture proven   HSV type I labia    Family History  Problem Relation Age of Onset  . Throat cancer Father   . Coronary artery disease Father   . Heart attack Father 69  . Stroke Father        during procedure  . Coronary artery disease Mother   . Hypertension Mother   . Hyperlipidemia Mother   . Pulmonary fibrosis Mother   . Lung disease Mother   . Prostate cancer Brother   . Heart disease Brother   . Diabetes Brother   . Diabetes Paternal Aunt        x 2  . Diabetes Paternal Grandmother   . Coronary artery disease Paternal Grandmother   . Stroke Maternal Grandmother        or MI  . Prostate cancer Paternal Uncle   . Migraines Daughter   . Migraines Daughter   . Colon cancer Neg Hx    Past Surgical History:  Procedure Laterality Date  . CATARACT EXTRACTION Bilateral 10/16 & 11/16  . EXCISION METACARPAL MASS Right 11/16/2016   Procedure: RIGHT LITTLE FINGER CYST EXCISION;  Surgeon: Milly Jakob, MD;  Location: [facility];  Service: Orthopedics;  Laterality: Right;  . TONSILLECTOMY  age 70  . tooth extract     with bone graft  . Turbinate sinus surgery  2003  . VAGINAL HYSTERECTOMY  1998   secondary to prolapse, ovaries remain   Social History   Social History Narrative   She lives with husband.  They have 2 grown children.   She is retired Product/process development scientist.   Highest level of education:  2 years of  college      Regular exercise, diet of fruits, veggies, limited fried foods, limited water      [facility]:   Originally from [place]. Has also lived in [place]. Previously worked doing Software engineer. No pets currently. No bird, mold, or hot tub exposure. Does have a musty smell in their home but it is new. Enjoys reading. 1 indoor plant. Has carpet in her home including in the bedroom. Also has draperies.    Immunization History  Administered Date(s) Administered  . Influenza, Seasonal, Injecte, Preservative Fre 04/09/2015  . Influenza,inj,Quad PF,6+ Mos 01/14/2016  . Pneumococcal Conjugate-13 11/25/2015  . Pneumococcal Polysaccharide-23 11/20/2013  . Tdap 11/20/2009     Objective: Vital Signs: BP 123/71 (BP Location: Left Arm, Patient Position: Sitting, Cuff Size: Normal)   Pulse 93   Resp 16   Ht 5\' 4"  (1.626 m)   Wt 168 lb 6.4 oz (76.4 kg)   LMP 07/02/1994   BMI 28.91 kg/m    Physical Exam Vitals signs and  nursing note reviewed.  Constitutional:      Appearance: She is well-developed.  HENT:     Head: Normocephalic and atraumatic.  Eyes:     Conjunctiva/sclera: Conjunctivae normal.  Neck:     Musculoskeletal: Normal range of motion.  Cardiovascular:     Rate and Rhythm: Normal rate and regular rhythm.     Heart sounds: Normal heart sounds.  Pulmonary:     Effort: Pulmonary effort is normal.     Breath sounds: Normal breath sounds.  Abdominal:     General: Bowel sounds are normal.     Palpations: Abdomen is soft.  Lymphadenopathy:     Cervical: No cervical adenopathy.  Skin:    General: Skin is warm and dry.     Capillary Refill: Capillary refill takes less than 2 seconds.  Neurological:     Mental Status: She is alert and oriented to person, place, and time.  Psychiatric:        Behavior: Behavior normal.    Musculoskeletal Exam: Generalized hyperalgesia on exam. C-spine, thoracic spine, and lumbar spine good ROM. Shoulder joints,  elbow joints, wrist joints, MCPs, PIPs, and DIPs good ROM with no synovitis. Small mucin cyst on left 4th digit.  Hip joints, knee joints, ankle joints, MTPs, PIPs, and DIPs good ROM with no synovitis.  No warmth or effusion of knee joints. Bilateral knee joint crepitus.  No tenderness or swelling of ankle joints.   CDAI Exam: CDAI Score: Not documented Patient Global Assessment: Not documented; Provider Global Assessment: Not documented Swollen: Not documented; Tender: Not documented Joint Exam   Not documented   There is currently no information documented on the homunculus. Go to the Rheumatology activity and complete the homunculus joint exam.  Investigation: No additional findings.  Imaging: No results found.  Recent Labs: Lab Results  Component Value Date   WBC 4.2 10/20/2017   HGB 13.5 10/20/2017   PLT 349 10/20/2017   NA 142 10/20/2017   K 4.5 10/20/2017   CL 106 10/20/2017   CO2 29 10/20/2017   GLUCOSE 84 10/20/2017   BUN 15 10/20/2017   CREATININE 0.90 10/20/2017   BILITOT 0.4 10/20/2017   ALKPHOS 69 06/23/2009   AST 14 10/20/2017   ALT 9 10/20/2017   PROT 6.7 10/20/2017   ALBUMIN 3.9 06/23/2009   CALCIUM 9.7 10/20/2017   GFRAA 77 10/20/2017    Speciality Comments: No specialty comments available.  Procedures:  No procedures performed Allergies: Trazodone hcl and Penicillins   Assessment / Plan:     Visit Diagnoses: Fibromyalgia -She has been having more frequent and more severe fibromyalgia flares.  She has generalized hyperalgesia and positive tender points on exam.  She has generalized muscle aches and muscle tenderness. She has trapezius muscle spasms and muscle tenderness.  She takes Robaxin 500 mg 1 tablet PRN for muscle spasms and Relafen 500 mg BID PRN for pain relief. She has been having frequent muscle cramps in bilateral lower extremities.  We discussed starting to take magnesium malate 250 mg po at bedtime.  We discussed potential side effects.   We discussed importance of regular exercise and good sleep hygiene.  We discussed stretching exercises and try to start aquatic therapy.  She will notify us if she would like to proceed with PT/aquatic therapy. She continues to have chronic fatigue and insomnia.  She takes Ambien 5 mg po at bedtime.  She does not need any refills at this time.  She will follow up in 6  months.   Other fatigue: She has chronic fatigue.  She has interrupted sleep at night due to the discomfort she experiences.  Good sleep hygiene discussed.   Other insomnia - She takes Ambien 5 mg po daily at bedtime.  Good sleep hygiene discussed.    Primary osteoarthritis of both hands: She has no synovitis or tenderness on exam.  Complete fist formation bilaterally.  Joint protection and muscle strengthening were discussed.   Primary osteoarthritis of both knees: No warmth or effusion.  She has good ROM with no discomfort.  She has bilateral knee joint crepitus.  She has pain going down steps. We discussed the importance of quad strengthening.   Impingement syndrome of shoulder, left: Chronic pian.  She has full ROM with discomfort.   Plantar fasciitis, right - Resolved.  DDD (degenerative disc disease), cervical: She has good ROM with some discomfort.  She has no symptoms of radiculopathy.    DDD lumbar spine: She has intermittent lower back pain.  She experiences muscle spasms at time. She takes Robaxin 500 mg 1 tablet PRN for muscle spasms.  She does not need refills at this time.   Other medical conditions are listed as follows:   History of gastroesophageal reflux (GERD)  History of asthma  History of migraine  History of hyperlipidemia  History of colon polyps  History of hypertension   Orders: No orders of the defined types were placed in this encounter.  No orders of the defined types were placed in this encounter.   Face-to-face time spent with patient was 30 minutes. Greater than 50% of time was spent  in counseling and coordination of care.  Follow-Up Instructions: Return in about 6 months (around 12/12/2018) for Fibromyalgia.   Ofilia Neas, PA-C  Note - This record has been created using Dragon software.  Chart creation errors have been sought, but may not always  have been located. Such creation errors do not reflect on  the standard of medical care.

## 2018-06-13 ENCOUNTER — Ambulatory Visit (INDEPENDENT_AMBULATORY_CARE_PROVIDER_SITE_OTHER): Payer: Medicare Other | Admitting: Allergy and Immunology

## 2018-06-13 ENCOUNTER — Encounter: Payer: Self-pay | Admitting: Allergy and Immunology

## 2018-06-13 ENCOUNTER — Encounter: Payer: Self-pay | Admitting: Physician Assistant

## 2018-06-13 ENCOUNTER — Ambulatory Visit (INDEPENDENT_AMBULATORY_CARE_PROVIDER_SITE_OTHER): Payer: Medicare Other | Admitting: Physician Assistant

## 2018-06-13 VITALS — BP 126/74 | HR 94 | Resp 18

## 2018-06-13 VITALS — BP 123/71 | HR 93 | Resp 16 | Ht 64.0 in | Wt 168.4 lb

## 2018-06-13 DIAGNOSIS — M797 Fibromyalgia: Secondary | ICD-10-CM | POA: Diagnosis not present

## 2018-06-13 DIAGNOSIS — Z8719 Personal history of other diseases of the digestive system: Secondary | ICD-10-CM

## 2018-06-13 DIAGNOSIS — J3089 Other allergic rhinitis: Secondary | ICD-10-CM | POA: Diagnosis not present

## 2018-06-13 DIAGNOSIS — Z86010 Personal history of colonic polyps: Secondary | ICD-10-CM

## 2018-06-13 DIAGNOSIS — Z8669 Personal history of other diseases of the nervous system and sense organs: Secondary | ICD-10-CM

## 2018-06-13 DIAGNOSIS — Z8679 Personal history of other diseases of the circulatory system: Secondary | ICD-10-CM

## 2018-06-13 DIAGNOSIS — G4709 Other insomnia: Secondary | ICD-10-CM

## 2018-06-13 DIAGNOSIS — M5030 Other cervical disc degeneration, unspecified cervical region: Secondary | ICD-10-CM

## 2018-06-13 DIAGNOSIS — K219 Gastro-esophageal reflux disease without esophagitis: Secondary | ICD-10-CM | POA: Diagnosis not present

## 2018-06-13 DIAGNOSIS — M722 Plantar fascial fibromatosis: Secondary | ICD-10-CM

## 2018-06-13 DIAGNOSIS — M19042 Primary osteoarthritis, left hand: Secondary | ICD-10-CM

## 2018-06-13 DIAGNOSIS — M47816 Spondylosis without myelopathy or radiculopathy, lumbar region: Secondary | ICD-10-CM

## 2018-06-13 DIAGNOSIS — M7542 Impingement syndrome of left shoulder: Secondary | ICD-10-CM | POA: Diagnosis not present

## 2018-06-13 DIAGNOSIS — M19041 Primary osteoarthritis, right hand: Secondary | ICD-10-CM

## 2018-06-13 DIAGNOSIS — M170 Bilateral primary osteoarthritis of knee: Secondary | ICD-10-CM | POA: Diagnosis not present

## 2018-06-13 DIAGNOSIS — J4540 Moderate persistent asthma, uncomplicated: Secondary | ICD-10-CM | POA: Diagnosis not present

## 2018-06-13 DIAGNOSIS — Z8639 Personal history of other endocrine, nutritional and metabolic disease: Secondary | ICD-10-CM

## 2018-06-13 DIAGNOSIS — R5383 Other fatigue: Secondary | ICD-10-CM

## 2018-06-13 DIAGNOSIS — Z8709 Personal history of other diseases of the respiratory system: Secondary | ICD-10-CM | POA: Diagnosis not present

## 2018-06-13 MED ORDER — AZITHROMYCIN 500 MG PO TABS: 500.0000 mg | ORAL_TABLET | Freq: Every day | ORAL | 0 refills | Status: AC

## 2018-06-13 NOTE — Patient Instructions (Addendum)
  1. Continue Pantoprazole 40mg  in the morning and famotidine in evening   2. Continue Dulera 100 - two inhalations 1-2 times per day depending on disease activity  3. Continue Dymista one spray each nostril two times per day  4. Continue montelukast 10mg  one time per day  5. Continue ProAir HFA and Zyrtec and nasal saline as needed  6.  For this most recent event, use the following:   A.  Azithromycin 500 mg once a day for 3 days  B.  Prednisone 10 mg once a day for 3 days  C.  Lots of nasal saline throughout the day  7.  Return to clinic in 6 months or earlier if problem

## 2018-06-13 NOTE — Progress Notes (Signed)
Follow-up Note  Referring Provider: Lanice Shirts, * Primary Provider: Lanice Shirts, MD Date of Office Visit: 06/13/2018  Subjective:   Christina Deleon (DOB: 01-20-1951) is a 68 y.o. female who returns to the [facility] [place] on 06/13/2018 in re-evaluation of the following:  HPI: Amberrose returns to this clinic in reevaluation of her asthma and allergic rhinitis and LPR.  Her last visit to this clinic was 06 December 2017.  She has seen Dr. Melvyn Novas during the interval and has had multiple different forms of medications administered for her asthma and she believes that [facility] probably gives her some benefit.  She is using her Dulera just 1 time per day at this point.  She rarely uses any short acting bronchodilator averaging out to less than 1 time per week.  It is interesting to note that her chest tightness which she believes is secondary to asthma appears to be related to stress and anxiety and when she performs a relaxation technique her breathing does much better.  It does not sound as though she has required a systemic steroid or antibiotic to treat any respiratory tract issue.  Her nose is doing relatively well at this point.  However, about 2 weeks ago she developed nasal congestion and facial pain and forehead pain and a little bit of a cough and drainage and some decreased ability to taste and some yellow nasal discharge.  This has not improved over the course of the past 2 weeks.  Her reflux is under pretty good control on her current plan.  She does not have any classic reflux symptoms at this point.  She still has some occasional throat clearing and postnasal drip but she controls this with a swallowing techniques and chewing gum.  She does not consume any caffeine or chocolate.  She did obtain the flu vaccine this fall.  Allergies as of 06/13/2018      Reactions   Trazodone Hcl Other (See Comments)   REACTION: bad headaches   Penicillins Nausea Only, Rash   Patient has taken Amoxicillin without an issue, per patient. Has patient had a PCN reaction causing immediate rash, facial/tongue/throat swelling, SOB or lightheadedness with hypotension: No Has patient had a PCN reaction causing severe rash involving mucus membranes or skin necrosis: No Has patient had a PCN reaction that required hospitalization No Has patient had a PCN reaction occurring within the last 10 years: No If all of the above answers are "NO", then may proceed with Cephalosporin use.      Medication List      ALPRAZolam 0.5 MG tablet Commonly known as:  XANAX as needed.   Azelastine-Fluticasone 137-50 MCG/ACT Susp Place 1 spray into the nose daily.   azithromycin 500 MG tablet Commonly known as:  ZITHROMAX Take 1 tablet (500 mg total) by mouth daily for 3 days.   betamethasone valerate ointment 0.1 % Commonly known as:  VALISONE Place a pea sized amount topically BID for one to weeks as needed. Not for daily long term use   budesonide-formoterol 80-4.5 MCG/ACT inhaler Commonly known as:  SYMBICORT Take 2 puffs first thing in am and then another 2 puffs about 12 hours later.   CALCIUM PO Take 1 tablet by mouth daily.   candesartan 16 MG tablet Commonly known as:  ATACAND Take 8 mg by mouth at bedtime.   cetirizine 10 MG tablet Commonly known as:  ZYRTEC Take 1 tablet (10 mg total) by mouth daily. One tab  daily for allergies   diclofenac sodium 1 % Gel Commonly known as:  VOLTAREN Apply 3 grams to 3 large joints up to 3 times daily   DULERA 100-5 MCG/ACT Aero Generic drug:  mometasone-formoterol Inhale 2 puffs into the lungs 2 (two) times daily.   esomeprazole 40 MG capsule Commonly known as:  NEXIUM Take 1 capsule (40 mg total) by mouth 2 (two) times daily before a meal.   famotidine 40 MG tablet Commonly known as:  PEPCID Take 1 tablet (40 mg total) by mouth at bedtime.   methocarbamol 500 MG tablet Commonly known as:  ROBAXIN Take 1 tablet (500  mg total) by mouth daily as needed for muscle spasms.   montelukast 10 MG tablet Commonly known as:  SINGULAIR TAKE 1 TABLET BY MOUTH ONCE DAILY   multivitamin tablet Take 1 tablet by mouth daily. Calcium, Vitamin B-12, Vitamin A, Vitamin C   nabumetone 500 MG tablet Commonly known as:  RELAFEN Take 1 tablet (500 mg total) by mouth 2 (two) times daily.   polyethylene glycol powder powder Commonly known as:  GLYCOLAX/MIRALAX Take 17 g by mouth daily.   pravastatin 20 MG tablet Commonly known as:  PRAVACHOL Take 20 mg by mouth daily.   PROAIR HFA 108 (90 Base) MCG/ACT inhaler Generic drug:  albuterol Inhale 2 puffs into the lungs every 6 (six) hours as needed for wheezing or shortness of breath.   VITAMIN A PO Take by mouth daily.   VITAMIN B-12 PO Take 1 tablet by mouth daily.   VITAMIN C PO Take 1 tablet by mouth daily.   Vitamin D 50 MCG (2000 UT) tablet Take 2,000 Units by mouth daily.   XIIDRA 5 % Soln Generic drug:  Lifitegrast Apply 1 drop to eye 2 (two) times daily.   zolmitriptan 5 MG disintegrating tablet Commonly known as:  ZOMIG ZMT Take 1 tablet (5 mg total) by mouth as needed for migraine.   zolpidem 5 MG tablet Commonly known as:  AMBIEN TAKE 1 TO 2 TABLETS BY MOUTH AT BEDTIME AS NEEDED       Past Medical History:  Diagnosis Date  . Adenomatous colon polyp 10/1997, and 03/2009  . Allergic rhinitis   . Allergy   . Arthritis    osteo  . Asthma   . Chronic gastritis   . Fibromyalgia   . GERD (gastroesophageal reflux disease)   . Hemorrhoids   . Hyperlipidemia   . Hypertension   . Menorrhagia    partial hysterectomy  . Migraines   . PONV (postoperative nausea and vomiting)   . Sleep disturbance   . 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 CATARACT EXTRACTION Bilateral 10/16 & 11/16  . EXCISION METACARPAL MASS Right 11/16/2016   Procedure:  RIGHT LITTLE FINGER CYST EXCISION;  Surgeon: Milly Jakob, MD;  Location: [place];  Service: Orthopedics;  Laterality: Right;  . TONSILLECTOMY  age 49  . tooth extract     with bone graft  . Turbinate sinus surgery  2003  . VAGINAL HYSTERECTOMY  1998   secondary to prolapse, ovaries remain    Review of systems negative except as noted in HPI / PMHx or noted below:  Review of Systems  Constitutional: Negative.   HENT: Negative.   Eyes: Negative.   Respiratory: Negative.   Cardiovascular: Negative.   Gastrointestinal: Negative.   Genitourinary: Negative.  Musculoskeletal: Negative.   Skin: Negative.   Neurological: Negative.   Endo/Heme/Allergies: Negative.   Psychiatric/Behavioral: Negative.      Objective:   Vitals:   06/13/18 1048  BP: 126/74  Pulse: 94  Resp: 18  SpO2: 98%          Physical Exam Constitutional:      Appearance: She is not diaphoretic.  HENT:     Head: Normocephalic.     Right Ear: Tympanic membrane, ear canal and external ear normal.     Left Ear: Tympanic membrane, ear canal and external ear normal.     Nose: Nose normal. No mucosal edema or rhinorrhea.     Mouth/Throat:     Pharynx: Uvula midline. No oropharyngeal exudate.  Eyes:     Conjunctiva/sclera: Conjunctivae normal.  Neck:     Thyroid: No thyromegaly.     Trachea: Trachea normal. No tracheal tenderness or tracheal deviation.  Cardiovascular:     Rate and Rhythm: Normal rate and regular rhythm.     Heart sounds: Normal heart sounds, S1 normal and S2 normal. No murmur.  Pulmonary:     Effort: No respiratory distress.     Breath sounds: Normal breath sounds. No stridor. No wheezing or rales.  Lymphadenopathy:     Head:     Right side of head: No tonsillar adenopathy.     Left side of head: No tonsillar adenopathy.     Cervical: No cervical adenopathy.  Skin:    Findings: No erythema or rash.     Nails: There is no clubbing.   Neurological:     Mental  Status: She is alert.     Diagnostics:   The patient had an Asthma Control Test with the following results: ACT Total Score: 16.    Results of FeNO obtained 14 April 2018 was 10 PPB  Assessment and Plan:   1. Asthma, moderate persistent, well-controlled   2. Other allergic rhinitis   3. LPRD (laryngopharyngeal reflux disease)     1. Continue Pantoprazole 40mg  in the morning and famotidine 40 mg in evening  2. Continue Dulera 100 - two inhalations 1-2 times per day depending on disease activity  3. Continue Dymista one spray each nostril two times per day  4. Continue montelukast 10mg  one time per day  5. Continue ProAir HFA and Zyrtec and nasal saline as needed  6.  For this most recent event, use the following:   A.  Azithromycin 500 mg once a day for 3 days  B.  Prednisone 10 mg once a day for 3 days  C.  Lots of nasal saline throughout the day  7.  Return to clinic in 6 months or earlier if problem  Ivorie has had a persistent sinusitis episode for 2 weeks and I am going to give her a broad-spectrum antibiotic and a relatively low dose of an systemic anti-inflammatory agent with prednisone for the next 3 days.  Overall her airway appears to be doing relatively well and she has a good understanding of how to treat her condition and she will remain on anti-inflammatory agents for airway and therapy directed against reflux and I will see her back in this clinic in 6 months or earlier if there is a problem.  Allena Katz, MD Allergy / Immunology [place]

## 2018-06-14 ENCOUNTER — Encounter: Payer: Self-pay | Admitting: Allergy and Immunology

## 2018-06-21 ENCOUNTER — Other Ambulatory Visit: Payer: Self-pay | Admitting: Physician Assistant

## 2018-06-22 NOTE — Telephone Encounter (Signed)
Last Visit: 06/13/2018 Next Visit: 12/12/2018  Last fill: 04/21/2018  Okay to refill Lorrin Mais?

## 2018-06-26 DIAGNOSIS — M3501 Sicca syndrome with keratoconjunctivitis: Secondary | ICD-10-CM | POA: Diagnosis not present

## 2018-06-26 DIAGNOSIS — H04123 Dry eye syndrome of bilateral lacrimal glands: Secondary | ICD-10-CM | POA: Diagnosis not present

## 2018-06-26 DIAGNOSIS — H1013 Acute atopic conjunctivitis, bilateral: Secondary | ICD-10-CM | POA: Diagnosis not present

## 2018-06-26 DIAGNOSIS — H01003 Unspecified blepharitis right eye, unspecified eyelid: Secondary | ICD-10-CM | POA: Diagnosis not present

## 2018-07-04 ENCOUNTER — Other Ambulatory Visit: Payer: Self-pay | Admitting: Rheumatology

## 2018-07-04 NOTE — Telephone Encounter (Signed)
Last Visit: 06/13/2018 Next Visit: 12/12/2018  Okay to refill per Dr. Estanislado Pandy

## 2018-07-19 ENCOUNTER — Telehealth: Payer: Self-pay | Admitting: Allergy and Immunology

## 2018-07-19 MED ORDER — HYDROCOD POLST-CPM POLST ER 10-8 MG/5ML PO SUER: 5.0000 mL | Freq: Two times a day (BID) | ORAL | 0 refills | Status: DC | PRN

## 2018-07-19 MED ORDER — PREDNISONE 10 MG PO TABS: 10.0000 mg | ORAL_TABLET | Freq: Every day | ORAL | 0 refills | Status: DC

## 2018-07-19 MED ORDER — CLINDAMYCIN HCL 150 MG PO CAPS: 150.0000 mg | ORAL_CAPSULE | Freq: Three times a day (TID) | ORAL | 0 refills | Status: DC

## 2018-07-19 NOTE — Telephone Encounter (Signed)
Spoke with patient and she is wondering if she can have cough syrup sent in as well?  She stated that she has a lot of coughing at night.

## 2018-07-19 NOTE — Telephone Encounter (Signed)
Lets have her use an antibiotic, clindamycin 150 3 times a day for 10 days plus prednisone 10mg  1 time per day for 10 days.

## 2018-07-19 NOTE — Telephone Encounter (Signed)
Tussionex 2.5-5.0 mL's every 12 hours as needed for cough, 60 mL's, no refill, narcotic warning.

## 2018-07-19 NOTE — Telephone Encounter (Signed)
Called patient advised she needs to go to see pcp to rule out other causes. Patient wants Dr Neldon Mc to advise on this

## 2018-07-19 NOTE — Telephone Encounter (Signed)
Patient called back. She contacted her PCP and they suggested she see Dr. Neldon Mc since that is who she saw last.

## 2018-07-19 NOTE — Telephone Encounter (Signed)
Patient states she has not gotten any better since her appointment on 06-13-18. The medication that was prescribed did not work. She has a cough, shortness of breath,headache, and drainage with yellow in it. She is afraid to come in to be seen. Pharmacy is [facility] on [address].

## 2018-07-20 MED ORDER — HYDROCOD POLST-CPM POLST ER 10-8 MG/5ML PO SUER: 2.5000 mL | Freq: Two times a day (BID) | ORAL | 0 refills | Status: DC | PRN

## 2018-07-20 NOTE — Addendum Note (Signed)
Addended by: Lucrezia Starch I on: 07/20/2018 08:28 AM   Modules accepted: Orders

## 2018-07-20 NOTE — Telephone Encounter (Signed)
I spoke with patient and she will come to the [facility] office 07-21-2018 to pick up the prescription for Tussionex.

## 2018-08-06 ENCOUNTER — Other Ambulatory Visit: Payer: Self-pay | Admitting: Physician Assistant

## 2018-08-07 NOTE — Telephone Encounter (Signed)
Last Visit:06/13/2018 Next Visit:12/12/2018  Okay to refill Ambien?

## 2018-08-14 ENCOUNTER — Ambulatory Visit (INDEPENDENT_AMBULATORY_CARE_PROVIDER_SITE_OTHER): Payer: Medicare Other | Admitting: Internal Medicine

## 2018-08-14 ENCOUNTER — Other Ambulatory Visit: Payer: Self-pay

## 2018-08-14 DIAGNOSIS — J45991 Cough variant asthma: Secondary | ICD-10-CM | POA: Diagnosis not present

## 2018-08-14 MED ORDER — PREDNISONE 10 MG PO TABS: ORAL_TABLET | ORAL | 0 refills | Status: DC

## 2018-08-14 NOTE — Progress Notes (Signed)
Subjective:    Patient ID: Christina Deleon, female    DOB: July 02, 1950  MRN: [id number]     Brief patient profile:  63 yobf never smoker former pt of Dr Ashok Cordia and [facility] with refractory cough x 2014          History of Present Illness  04/13/2018  Initial ov/Wert consultation per Fuller Plan   re: cough x 2014 worse control x Spring 2019 / GI rec elevate bed bid ppi and hs h2 / Constance Holster (ent) rec avoid acei and arb  Chief Complaint  Patient presents with  . Pulmonary Consult    Referred by Dr. Fuller Plan. Pt states she has not felt well "all year". She c/o non prod cough and SOB.  She states she coughs alot at night.   She has occ wheezing and chest tightness.  She is using her albuterol inhaler 3-4 x per wk on average.   Spring of 2019 worsening  cough esp at hs > Kozlow changed meds > didn't work > changed back Nestor's med > still not better and has not returned to [employer], seeing Fuller Plan for gerd documented on DgEs  Previous allergy shots no better/ ?  better with singulair  Dyspnea:  Shopping with [employer] parking due to fibromyalgia  Cough: seems better first thing in am then start coughing w/in first 27min of stirring assoc with subjective wheeze and chest tightness  Sleeping: if soundly sleeping does fine / sleeps on L side / bed is horizontal  SABA use: seems to help but makes her shaky  02: none Prednisone > all symptoms are gone "nearly 100%  Each time she gets a shot" rec Stop symbicort 160 Depomedrol 120 mg IM today  Dulera 100 Take 2 puffs first thing in am and then another 2 puffs about 12 hours later.  Work on inhaler technique:    Only use your albuterol as a rescue medication  Continue protonix 40 mg Take 30- 60 min before your first and last meals of the day and pepcid 40 mg at bedtime GERD diet   For drainage / throat tickle try take CHLORPHENIRAMINE  4 mg ([facility]ortab 4 [facility] at [facility])  - take one every 4 hours as needed -  Please schedule a follow up office visit in 4 weeks, sooner if  needed  with all medications /inhalers/ solutions in hand so we can verify exactly what you are taking. This includes all medications from all doctors and over the counters    05/15/2018  f/u ov/Wert re: coughx 2014 / did not bring meds as req   Chief Complaint  Patient presents with  . Follow-up    Her cough and SOB have improved some. She has used her proair 1 or 2 x since her last visit.   Dyspnea:  Not limited by breathing from desired activities  Unless coughing Cough: after stirs in am and in pm before bed  Sleeping: ok on flat bed/ wedge pillow maybe 30 degrees elevation  SABA use: rarely proair while on dulera 100 bid  rec Symbicort 80  Take 2 puffs first thing in am and then another 2 puffs about 12 hours later. Work on inhaler technique: Only use your albuterol as a rescue medication  For drainage / throat tickle try take CHLORPHENIRAMINE  4 mg ([facility] = chlortab) - take one every 4 hours as needed -  Please schedule a follow up visit in 3 months but call sooner if needed  with all medications /inhalers/  solutions in hand so we can verify exactly what you are taking. This includes all medications from all doctors and over the counters   Virtual Visit via Telephone Note 08/14/2018   I connected with Christina Deleon on 08/14/18 at  9:30 AM EDT by telephone and verified that I am speaking with the correct person using two identifiers.   I discussed the limitations, risks, security and privacy concerns of performing an evaluation and management service by telephone and the availability of in person appointments. I also discussed with the patient that there may be a patient responsible charge related to this service. The patient expressed understanding and agreed to proceed.   History of Present Illness: cough worse since spring season  Dyspnea:  Not active at all but at this level Not limited by breathing from desired activities   Cough: worse  hs when takes h1 but  also with  daytime exp to outdoors with sneezing but mostly dry  Sleeping: does fine once asleep  SABA use: twice daily at most never more/ not sure taking maint rx correctly as mostly sample dependent      No obvious day to day or daytime variability or assoc excess/ purulent sputum or mucus plugs or hemoptysis or cp or chest tightness, subjective wheeze or overt sinus or hb symptoms.    Also denies any obvious fluctuation of symptoms with weather or environmental changes or other aggravating or alleviating factors except as outlined above.   Meds reviewed/ med reconciliation attempted > still very confused with names of meds, says just using samples of symbicort/dulera from this officed and [facility]'s >  Says nebs help "for days" and so does prednisone "about the same"           Observations/Objective: Clear voice, full sentences    Assessment and Plan: See problem list for active a/p's   Follow Up Instructions: See avs for instructions unique to this ov which includes revised/ updated med list     I discussed the assessment and treatment plan with the patient. The patient was provided an opportunity to ask questions and all were answered. The patient agreed with the plan and demonstrated an understanding of the instructions.   The patient was advised to call back or seek an in-person evaluation if the symptoms worsen or if the condition fails to improve as anticipated.  I provided 30 minutes of non-face-to-face time during this encounter.   Christina Gully, MD

## 2018-08-14 NOTE — Patient Instructions (Addendum)
If get worse, take  Prednisone 10 mg take  4 each am x 2 days,   2 each am x 2 days,  1 each am x 2 days and stop   Take chlorpheniramine  4 mg x one hour before bedtime   Take clariton or zyrtec in am as needed   Please schedule a follow up visit in 3 months with either me or Dr Neldon Mc (pick one!) but call sooner if needed  with all medications /inhalers/ solutions in hand so we can verify exactly what you are taking. This includes all medications from all doctors and over the counters

## 2018-08-15 ENCOUNTER — Encounter: Payer: Self-pay | Admitting: Internal Medicine

## 2018-08-15 NOTE — Assessment & Plan Note (Signed)
Onset of cough 2014  10/08/15 Allergy profile:  IgE: 149 RAST Panel: Negative - Dg Es 11/20/15 . Small hiatal hernia with moderate spontaneous gastroesophageal reflux. Sinus CT 06/27/17 nl  Spirometry 04/13/2018  FEV1 1.71 (89%)  Ratio 78 s curvature while symptomatic  FENO 04/13/2018  =   10  - 04/13/2018    try dulera 100 2bid instead of symb 160 as favor uacs > asthma - 05/15/2018  After extensive coaching inhaler device,  effectiveness =    75%  - Added 1st gen H1 blockers per guidelines   05/15/18 hs > improved nocturnally but spends the first hour coughing at hs so rec take h1 x 1 h prior to hs and since can't take during the day s drowsiness then use clariton in am prn pnds/ sneezing   If not improving with spring time exp then ok to add Prednisone 10 mg take  4 each am x 2 days,   2 each am x 2 days,  1 each am x 2 days and stop   Needs to f/u here or with Dr Neldon Mc, not both, as easier to keep track of response to rx and verify she's actually taking meds the way they are intended   Each maintenance medication was reviewed in detail including most importantly the difference between maintenance and as needed and under what circumstances the prns are to be used.  Please see AVS for specific  Instructions which are unique to this visit and I personally typed out  which were reviewed in detail and the patient was mailed a copy

## 2018-09-04 DIAGNOSIS — E041 Nontoxic single thyroid nodule: Secondary | ICD-10-CM | POA: Diagnosis not present

## 2018-09-04 DIAGNOSIS — K219 Gastro-esophageal reflux disease without esophagitis: Secondary | ICD-10-CM | POA: Diagnosis not present

## 2018-09-04 DIAGNOSIS — F419 Anxiety disorder, unspecified: Secondary | ICD-10-CM | POA: Diagnosis not present

## 2018-09-04 DIAGNOSIS — J4530 Mild persistent asthma, uncomplicated: Secondary | ICD-10-CM | POA: Diagnosis not present

## 2018-09-04 DIAGNOSIS — I10 Essential (primary) hypertension: Secondary | ICD-10-CM | POA: Diagnosis not present

## 2018-10-11 ENCOUNTER — Other Ambulatory Visit: Payer: Self-pay | Admitting: Physician Assistant

## 2018-10-11 NOTE — Telephone Encounter (Signed)
Last Visit:06/13/2018 Next Visit:12/12/2018  Okay to refill Ambien?

## 2018-11-06 ENCOUNTER — Encounter: Payer: Self-pay | Admitting: Allergy and Immunology

## 2018-11-06 ENCOUNTER — Other Ambulatory Visit: Payer: Self-pay

## 2018-11-06 ENCOUNTER — Ambulatory Visit (INDEPENDENT_AMBULATORY_CARE_PROVIDER_SITE_OTHER): Payer: Medicare Other | Admitting: Allergy and Immunology

## 2018-11-06 DIAGNOSIS — J4540 Moderate persistent asthma, uncomplicated: Secondary | ICD-10-CM

## 2018-11-06 DIAGNOSIS — K219 Gastro-esophageal reflux disease without esophagitis: Secondary | ICD-10-CM | POA: Diagnosis not present

## 2018-11-06 DIAGNOSIS — J3089 Other allergic rhinitis: Secondary | ICD-10-CM

## 2018-11-06 DIAGNOSIS — R442 Other hallucinations: Secondary | ICD-10-CM

## 2018-11-06 MED ORDER — FLUCONAZOLE 150 MG PO TABS: 150.0000 mg | ORAL_TABLET | ORAL | 0 refills | Status: DC

## 2018-11-06 MED ORDER — CEFDINIR 300 MG PO CAPS: 300.0000 mg | ORAL_CAPSULE | Freq: Two times a day (BID) | ORAL | 0 refills | Status: DC

## 2018-11-06 MED ORDER — PREDNISONE 10 MG PO TABS: 10.0000 mg | ORAL_TABLET | Freq: Every day | ORAL | 0 refills | Status: DC

## 2018-11-06 NOTE — Progress Notes (Signed)
[place] [facility] [place] [facility] [place]   Follow-up Note  Referring Provider: Lanice Shirts, * Primary Provider: Lanice Shirts, MD Date of Office Visit: 11/06/2018  Subjective:   Christina Deleon (DOB: 09-Apr-1951) is a 68 y.o. female who returns to the [employer] on 11/06/2018 in re-evaluation of the following:  HPI: This is a E-med visit requested by patient who is located at home.  Eriko is followed in this clinic for asthma and allergic rhinitis and LPR.  Her last visit to this clinic was 13 June 2018.  Past 2 weeks more cough especially at night. Throat intermittently sore. Has coating in mouth like thrush. Hoarse voice and slight nasal congestion. When breath in smells smoke and dust through nostrils.   Treating reflux but not under control. Throat clearing a lot.   Last antibiotic and steroid February 2020 and azithromycin gave her a headache with that administration.   Allergies as of 11/06/2018      Reactions   Trazodone Hcl Other (See Comments)   REACTION: bad headaches   Clindamycin/lincomycin Other (See Comments)   headaches   Penicillins Nausea Only, Rash   Patient has taken Amoxicillin without an issue, per patient. Has patient had a PCN reaction causing immediate rash, facial/tongue/throat swelling, SOB or lightheadedness with hypotension: No Has patient had a PCN reaction causing severe rash involving mucus membranes or skin necrosis: No Has patient had a PCN reaction that required hospitalization No Has patient had a PCN reaction occurring within the last 10 years: No If all of the above answers are "NO", then may proceed with Cephalosporin use.      Medication List      ALPRAZolam 0.5 MG tablet Commonly known as: XANAX as needed.   Azelastine-Fluticasone 137-50 MCG/ACT Susp Place 1 spray into the nose daily.   betamethasone valerate ointment 0.1 % Commonly known as: VALISONE Place a pea sized amount  topically BID for one to weeks as needed. Not for daily long term use   budesonide-formoterol 80-4.5 MCG/ACT inhaler Commonly known as: Symbicort Take 2 puffs first thing in am and then another 2 puffs about 12 hours later.   CALCIUM PO Take 1 tablet by mouth daily.   candesartan 16 MG tablet Commonly known as: ATACAND Take 8 mg by mouth at bedtime.   cetirizine 10 MG tablet Commonly known as: ZYRTEC Take 1 tablet (10 mg total) by mouth daily. One tab daily for allergies   chlorpheniramine-HYDROcodone 10-8 MG/5ML Suer Commonly known as: Tussionex Pennkinetic ER Take 2.5-5 mLs by mouth every 12 (twelve) hours as needed for cough. Please take 2.5-5 mL every 12 hours as needed   diclofenac sodium 1 % Gel Commonly known as: VOLTAREN Apply 3 grams to 3 large joints up to 3 times daily   Dulera 100-5 MCG/ACT Aero Generic drug: mometasone-formoterol Inhale 2 puffs into the lungs 2 (two) times daily.   famotidine 40 MG tablet Commonly known as: Pepcid Take 1 tablet (40 mg total) by mouth at bedtime.   methocarbamol 500 MG tablet Commonly known as: ROBAXIN TAKE 1 TABLET(500 MG) BY MOUTH DAILY AS NEEDED FOR MUSCLE SPASMS   montelukast 10 MG tablet Commonly known as: SINGULAIR TAKE 1 TABLET BY MOUTH ONCE DAILY   multivitamin tablet Take 1 tablet by mouth daily. Calcium, Vitamin B-12, Vitamin A, Vitamin C   nabumetone 500 MG tablet Commonly known as: RELAFEN Take 1 tablet (500 mg total) by mouth 2 (two) times  daily.   pantoprazole 40 MG tablet Commonly known as: PROTONIX Take 40 mg by mouth 2 (two) times a day.   polyethylene glycol powder 17 GM/SCOOP powder Commonly known as: GLYCOLAX/MIRALAX Take 17 g by mouth daily. What changed:   when to take this  reasons to take this   pravastatin 20 MG tablet Commonly known as: PRAVACHOL Take 20 mg by mouth daily.   ProAir HFA 108 (90 Base) MCG/ACT inhaler Generic drug: albuterol Inhale 2 puffs into the lungs every 6  (six) hours as needed for wheezing or shortness of breath.   VITAMIN A PO Take by mouth daily.   VITAMIN B-12 PO Take 1 tablet by mouth daily.   VITAMIN C PO Take 1 tablet by mouth daily.   Vitamin D 50 MCG (2000 UT) tablet Take 2,000 Units by mouth daily.   Xiidra 5 % Soln Generic drug: Lifitegrast Apply 1 drop to eye 2 (two) times daily.   zolmitriptan 5 MG disintegrating tablet Commonly known as: Zomig ZMT Take 1 tablet (5 mg total) by mouth as needed for migraine.   zolpidem 5 MG tablet Commonly known as: AMBIEN TAKE 1 TO 2 TABLETS BY MOUTH AT BEDTIME AS NEEDED       Past Medical History:  Diagnosis Date  . Adenomatous colon polyp 10/1997, and 03/2009  . Allergic rhinitis   . Allergy   . Arthritis    osteo  . Asthma   . Chronic gastritis   . Fibromyalgia   . GERD (gastroesophageal reflux disease)   . Hemorrhoids   . Hyperlipidemia   . Hypertension   . Menorrhagia    partial hysterectomy  . Migraines   . PONV (postoperative nausea and vomiting)   . Sleep disturbance   . 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 CATARACT EXTRACTION Bilateral 10/16 & 11/16  . EXCISION METACARPAL MASS Right 11/16/2016   Procedure: RIGHT LITTLE FINGER CYST EXCISION;  Surgeon: Milly Jakob, MD;  Location: [facility];  Service: Orthopedics;  Laterality: Right;  . TONSILLECTOMY  age 56  . tooth extract     with bone graft  . Turbinate sinus surgery  2003  . VAGINAL HYSTERECTOMY  1998   secondary to prolapse, ovaries remain    Review of systems negative except as noted in HPI / PMHx or noted below:  Review of Systems  Constitutional: Negative.   HENT: Negative.   Eyes: Negative.   Respiratory: Negative.   Cardiovascular: Negative.   Gastrointestinal: Negative.   Genitourinary: Negative.   Musculoskeletal: Negative.   Skin: Negative.   Neurological:  Negative.   Endo/Heme/Allergies: Negative.   Psychiatric/Behavioral: Negative.      Objective:   There were no vitals filed for this visit.        Physical Exam-deferred  Diagnostics: none  Assessment and Plan:   1. Not well controlled moderate persistent asthma   2. Other allergic rhinitis   3. LPRD (laryngopharyngeal reflux disease)   4. Phantosmia     1. Continue Pantoprazole 40 mg in the morning and famotidine 40 mg in evening   2. Continue Dulera 100 - two inhalations 1-2 times per day depending on disease activity  3. Continue Dymista one spray each nostril two times per day  4. Continue montelukast 10mg  one time per day  5. Continue ProAir HFA and Zyrtec and nasal saline as needed  6.  For this most recent event, use the following:   A. Prednisone 10 mg - 1 tablet daily for 10 days  B. Cefdinir 300 mg - 1 tablet 2 times per day for 10 days  C. Diflucan 150 - today, repeat in 1 week  7.  Return to clinic in 6 months or earlier if problem  8. Obtain fall flu vaccine  Blanch Media sounds as though she may have a infection with her phantosmia and her loss of cough control and we will treat her with the therapy noted above.  In addition it does sound like she has thrush and we will treat her with a dose of fluconazole today and then she can repeat that dose in a week should which should cover her during her antibiotic and steroid use.  Assuming she does well I will see her back in this clinic in 6 months or earlier if there is a problem.  Allena Katz, MD Allergy / Immunology [place]

## 2018-11-06 NOTE — Patient Instructions (Addendum)
  1. Continue Pantoprazole 40mg  in the morning and famotidine 40 mg in evening   2. Continue Dulera 100 - two inhalations 1-2 times per day depending on disease activity  3. Continue Dymista one spray each nostril two times per day  4. Continue montelukast 10mg  one time per day  5. Continue ProAir HFA and Zyrtec and nasal saline as needed  6.  For this most recent event, use the following:   A. Prednisone 10 mg - 1 tablet daily for 10 days  B. Cefdinir 300 mg - 1 tablet 2 times per day for 10 days  C. Diflucan 150 - today, repeat in 1 week  7.  Return to clinic in 6 months or earlier if problem  8. Obtain fall flu vaccine

## 2018-11-07 ENCOUNTER — Encounter: Payer: Self-pay | Admitting: Allergy and Immunology

## 2018-11-10 ENCOUNTER — Other Ambulatory Visit: Payer: Self-pay | Admitting: Allergy and Immunology

## 2018-11-13 ENCOUNTER — Telehealth: Payer: Self-pay

## 2018-11-13 NOTE — Telephone Encounter (Signed)
Called patient appt made for 11/14/2018 @ 1:30pm to see Dr Neldon Mc

## 2018-11-13 NOTE — Telephone Encounter (Signed)
Dr Kozlow please advise 

## 2018-11-13 NOTE — Telephone Encounter (Signed)
Please have her come to clinic on Tuesday.

## 2018-11-13 NOTE — Telephone Encounter (Signed)
Patient doesn't feel like the meds that he put her on last week havent really improved her symptoms. She is still having the smoke smell. She did start having sinus drainage this morning.  She is still have some SOB & Coughing.   [facility].

## 2018-11-14 ENCOUNTER — Encounter: Payer: Self-pay | Admitting: Allergy and Immunology

## 2018-11-14 ENCOUNTER — Ambulatory Visit (INDEPENDENT_AMBULATORY_CARE_PROVIDER_SITE_OTHER): Payer: Medicare Other | Admitting: Allergy and Immunology

## 2018-11-14 ENCOUNTER — Other Ambulatory Visit: Payer: Self-pay

## 2018-11-14 VITALS — BP 126/88 | HR 64 | Temp 97.7°F | Resp 16

## 2018-11-14 DIAGNOSIS — R442 Other hallucinations: Secondary | ICD-10-CM

## 2018-11-14 DIAGNOSIS — K219 Gastro-esophageal reflux disease without esophagitis: Secondary | ICD-10-CM

## 2018-11-14 DIAGNOSIS — J4540 Moderate persistent asthma, uncomplicated: Secondary | ICD-10-CM | POA: Diagnosis not present

## 2018-11-14 DIAGNOSIS — J3089 Other allergic rhinitis: Secondary | ICD-10-CM

## 2018-11-14 MED ORDER — METHYLPREDNISOLONE ACETATE 80 MG/ML IJ SUSP
80.0000 mg | Freq: Once | INTRAMUSCULAR | Status: AC
  Administered 2018-11-14: 80 mg via INTRAMUSCULAR

## 2018-11-14 MED ORDER — PANTOPRAZOLE SODIUM 40 MG PO TBEC: 40.0000 mg | DELAYED_RELEASE_TABLET | Freq: Two times a day (BID) | ORAL | 1 refills | Status: DC

## 2018-11-14 MED ORDER — ALBUTEROL SULFATE HFA 108 (90 BASE) MCG/ACT IN AERS: 2.0000 | INHALATION_SPRAY | Freq: Four times a day (QID) | RESPIRATORY_TRACT | 1 refills | Status: DC | PRN

## 2018-11-14 MED ORDER — AZITHROMYCIN 500 MG PO TABS: 500.0000 mg | ORAL_TABLET | Freq: Every day | ORAL | 0 refills | Status: DC

## 2018-11-14 MED ORDER — AZELASTINE-FLUTICASONE 137-50 MCG/ACT NA SUSP: 1.0000 | Freq: Every day | NASAL | 5 refills | Status: DC

## 2018-11-14 MED ORDER — DULERA 100-5 MCG/ACT IN AERO: 2.0000 | INHALATION_SPRAY | Freq: Two times a day (BID) | RESPIRATORY_TRACT | 5 refills | Status: DC

## 2018-11-14 MED ORDER — FAMOTIDINE 40 MG PO TABS: 40.0000 mg | ORAL_TABLET | Freq: Every day | ORAL | 1 refills | Status: DC

## 2018-11-14 NOTE — Patient Instructions (Addendum)
  1. Continue Pantoprazole 40mg  in the morning and famotidine 40 mg in evening   2. Continue Dulera 100 - two inhalations 2 times per day  3. Continue Dymista one spray each nostril two times per day  4. Continue montelukast 10mg  one time per day  5. Continue ProAir HFA and Zyrtec and nasal saline as needed  6.  For this most recent event, use the following:    A.  Depo-Medrol 80 IM delivered in clinic  B.  Azithromycin 500 mg - 1 tablet 1 time per day for 3 days  C.  Can add Mucinex DM - 1-2 tablets 1-2 times a day  D.  Can add nasal saline multiple times a day  7.  Further evaluation and treatment?  8.  Return to clinic in 6 months or earlier if problem  9. Obtain fall flu vaccine

## 2018-11-14 NOTE — Progress Notes (Signed)
[place]   Follow-up Note  Referring Provider: Wenda Low, MD Primary Provider: Wenda Low, MD Date of Office Visit: 11/14/2018  Subjective:   Christina Deleon (DOB: 1951/04/08) is a 68 y.o. female who [facility]/2018 in re-evaluation of the following:  HPI: Christina Deleon returns to this clinic in evaluation of respiratory tract problems.  She is followed in this clinic for asthma and allergic rhinitis and LPR.  Her last visit with [place] was via a E - med visit on 06 November 2018 at which point in time she was having a two week history of coughing and sore throat and hoarse voice and nasal congestion and phantosmia.  She was prescribed a broad-spectrum cephalosporin and low-dose prednisone and also given a dose of Diflucan to address these issues and thrush.  She is not any better and she basically has the exact same symptoms she had during that E-med visit.  This will be a total duration of about 3 weeks of this issue.  She has not had to use her short acting bronchodilator and she is not really having shortness of breath.  She has not been having any high fevers or ugly nasal discharge.  She is at home with her husband who is not showing any signs of her respiratory tract infection.  Allergies as of 11/14/2018      Reactions   Trazodone Hcl Other (See Comments)   REACTION: bad headaches   Clindamycin/lincomycin Other (See Comments)   headaches   Penicillins Nausea Only, Rash   Patient has taken Amoxicillin without an issue, per patient. Has patient had a PCN reaction causing immediate rash, facial/tongue/throat swelling, SOB or lightheadedness with hypotension: No Has patient had a PCN reaction causing severe rash involving mucus membranes or skin necrosis: No Has patient had a PCN reaction that required hospitalization No Has patient had a PCN reaction occurring within the last 10 years: No If all of the  above answers are "NO", then may proceed with Cephalosporin use.      Medication List      ALPRAZolam 0.5 MG tablet Commonly known as: XANAX as needed.   Azelastine-Fluticasone 137-50 MCG/ACT Susp Place 1 spray into the nose daily.   betamethasone valerate ointment 0.1 % Commonly known as: VALISONE Place a pea sized amount topically BID for one to weeks as needed. Not for daily long term use   budesonide-formoterol 80-4.5 MCG/ACT inhaler Commonly known as: Symbicort Take 2 puffs first thing in am and then another 2 puffs about 12 hours later.   CALCIUM PO Take 1 tablet by mouth daily.   candesartan 16 MG tablet Commonly known as: ATACAND Take 8 mg by mouth at bedtime.   cefdinir 300 MG capsule Commonly known as: OMNICEF Take 1 capsule (300 mg total) by mouth 2 (two) times daily.   cetirizine 10 MG tablet Commonly known as: ZYRTEC Take 1 tablet (10 mg total) by mouth daily. One tab daily for allergies   chlorpheniramine-HYDROcodone 10-8 MG/5ML Suer Commonly known as: Tussionex Pennkinetic ER Take 2.5-5 mLs by mouth every 12 (twelve) hours as needed for cough. Please take 2.5-5 mL every 12 hours as needed   diclofenac sodium 1 % Gel Commonly known as: VOLTAREN Apply 3 grams to 3 large joints up to 3 times daily   Dulera 100-5 MCG/ACT Aero Generic drug: mometasone-formoterol Inhale 2 puffs into the lungs 2 (two) times daily.  famotidine 40 MG tablet Commonly known as: Pepcid Take 1 tablet (40 mg total) by mouth at bedtime.   fluconazole 150 MG tablet Commonly known as: Diflucan Take 1 tablet (150 mg total) by mouth once a week.   methocarbamol 500 MG tablet Commonly known as: ROBAXIN TAKE 1 TABLET(500 MG) BY MOUTH DAILY AS NEEDED FOR MUSCLE SPASMS   montelukast 10 MG tablet Commonly known as: SINGULAIR TAKE 1 TABLET BY MOUTH ONCE DAILY   multivitamin tablet Take 1 tablet by mouth daily. Calcium, Vitamin B-12, Vitamin A, Vitamin C   nabumetone 500 MG  tablet Commonly known as: RELAFEN Take 1 tablet (500 mg total) by mouth 2 (two) times daily.   pantoprazole 40 MG tablet Commonly known as: PROTONIX Take 40 mg by mouth 2 (two) times a day.   polyethylene glycol powder 17 GM/SCOOP powder Commonly known as: GLYCOLAX/MIRALAX Take 17 g by mouth daily. What changed:   when to take this  reasons to take this   pravastatin 20 MG tablet Commonly known as: PRAVACHOL Take 20 mg by mouth daily.   predniSONE 10 MG tablet Commonly known as: DELTASONE Take 1 tablet (10 mg total) by mouth daily with breakfast.   ProAir HFA 108 (90 Base) MCG/ACT inhaler Generic drug: albuterol Inhale 2 puffs into the lungs every 6 (six) hours as needed for wheezing or shortness of breath.   VITAMIN A PO Take by mouth daily.   VITAMIN B-12 PO Take 1 tablet by mouth daily.   VITAMIN C PO Take 1 tablet by mouth daily.   Vitamin D 50 MCG (2000 UT) tablet Take 2,000 Units by mouth daily.   Xiidra 5 % Soln Generic drug: Lifitegrast Apply 1 drop to eye 2 (two) times daily.   zolmitriptan 5 MG disintegrating tablet Commonly known as: Zomig ZMT Take 1 tablet (5 mg total) by mouth as needed for migraine.   zolpidem 5 MG tablet Commonly known as: AMBIEN TAKE 1 TO 2 TABLETS BY MOUTH AT BEDTIME AS NEEDED       Past Medical History:  Diagnosis Date  . Adenomatous colon polyp 10/1997, and 03/2009  . Allergic rhinitis   . Allergy   . Arthritis    osteo  . Asthma   . Chronic gastritis   . Fibromyalgia   . GERD (gastroesophageal reflux disease)   . Hemorrhoids   . Hyperlipidemia   . Hypertension   . Menorrhagia    partial hysterectomy  . Migraines   . PONV (postoperative nausea and vomiting)   . Sleep disturbance   . 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 CATARACT EXTRACTION Bilateral 10/16 & 11/16  . EXCISION METACARPAL MASS Right 11/16/2016    Procedure: RIGHT LITTLE FINGER CYST EXCISION;  Surgeon: Milly Jakob, MD;  Location: [place];  Service: Orthopedics;  Laterality: Right;  . TONSILLECTOMY  age 38  . tooth extract     with bone graft  . Turbinate sinus surgery  2003  . VAGINAL HYSTERECTOMY  1998   secondary to prolapse, ovaries remain    Review of systems negative except as noted in HPI / PMHx or noted below:  Review of Systems  Constitutional: Negative.   HENT: Negative.   Eyes: Negative.   Respiratory: Negative.   Cardiovascular: Negative.   Gastrointestinal: Negative.   Genitourinary: Negative.   Musculoskeletal: Negative.   Skin: Negative.   Neurological:  Negative.   Endo/Heme/Allergies: Negative.   Psychiatric/Behavioral: Negative.      Objective:   Vitals:   11/14/18 1335  BP: 126/88  Pulse: 64  Resp: 16  Temp: 97.7 F (36.5 C)  SpO2: 97%          Physical Exam Constitutional:      Appearance: She is not diaphoretic.  HENT:     Head: Normocephalic.     Right Ear: Tympanic membrane, ear canal and external ear normal.     Left Ear: Tympanic membrane, ear canal and external ear normal.     Nose: Nose normal. No mucosal edema or rhinorrhea.     Mouth/Throat:     Pharynx: Uvula midline. No oropharyngeal exudate.  Eyes:     Conjunctiva/sclera: Conjunctivae normal.  Neck:     Thyroid: No thyromegaly.     Trachea: Trachea normal. No tracheal tenderness or tracheal deviation.  Cardiovascular:     Rate and Rhythm: Normal rate and regular rhythm.     Heart sounds: Normal heart sounds, S1 normal and S2 normal. No murmur.  Pulmonary:     Effort: No respiratory distress.     Breath sounds: Normal breath sounds. No stridor. No wheezing or rales.  Lymphadenopathy:     Head:     Right side of head: No tonsillar adenopathy.     Left side of head: No tonsillar adenopathy.     Cervical: No cervical adenopathy.  Skin:    Findings: No erythema or rash.     Nails: There is no  clubbing.   Neurological:     Mental Status: She is alert.     Diagnostics:    Spirometry was performed and demonstrated an FEV1 of 1.75 at 92 % of predicted.  The patient had an Asthma Control Test with the following results: ACT Total Score: 8.    Assessment and Plan:   1. Not well controlled moderate persistent asthma   2. Other allergic rhinitis   3. LPRD (laryngopharyngeal reflux disease)   4. Phantosmia     1. Continue Pantoprazole 40mg  in the morning and famotidine 40 mg in evening   2. Continue Dulera 100 - two inhalations 2 times per day  3. Continue Dymista one spray each nostril two times per day  4. Continue montelukast 10mg  one time per day  5. Continue ProAir HFA and Zyrtec and nasal saline as needed  6.  For this most recent event, use the following:    A.  Depo-Medrol 80 IM delivered in clinic  B.  Azithromycin 500 mg - 1 tablet 1 time per day for 3 days  C.  Can add Mucinex DM - 1-2 tablets 1-2 times a day  D.  Can add nasal saline multiple times a day  7.  Further evaluation and treatment?  8.  Return to clinic in 6 months or earlier if problem  9. Obtain fall flu vaccine  Kelley appears to have a persistent respiratory tract infection and she has not responded to a broad-spectrum cephalosporin and we will now treat her with azithromycin to cover the possibility of mycoplasma and also give her a systemic steroid.  During my last note she had told me that azithromycin gave rise to a headache but in actuality it was doxycycline administration in the past that gave rise to a headache and she has never really had problems with azithromycin.  She will keep in contact with me noting her response to this approach as we move  forward.  I will see her back in this clinic in 6 months or earlier if there is a problem.  Allena Katz, MD Allergy / Immunology [place]

## 2018-11-15 ENCOUNTER — Encounter: Payer: Self-pay | Admitting: Allergy and Immunology

## 2018-11-17 DIAGNOSIS — G43109 Migraine with aura, not intractable, without status migrainosus: Secondary | ICD-10-CM | POA: Diagnosis not present

## 2018-11-17 DIAGNOSIS — G2581 Restless legs syndrome: Secondary | ICD-10-CM | POA: Diagnosis not present

## 2018-11-17 DIAGNOSIS — I10 Essential (primary) hypertension: Secondary | ICD-10-CM | POA: Diagnosis not present

## 2018-11-17 DIAGNOSIS — F419 Anxiety disorder, unspecified: Secondary | ICD-10-CM | POA: Diagnosis not present

## 2018-11-17 DIAGNOSIS — M797 Fibromyalgia: Secondary | ICD-10-CM | POA: Diagnosis not present

## 2018-11-17 DIAGNOSIS — K219 Gastro-esophageal reflux disease without esophagitis: Secondary | ICD-10-CM | POA: Diagnosis not present

## 2018-11-17 DIAGNOSIS — J4530 Mild persistent asthma, uncomplicated: Secondary | ICD-10-CM | POA: Diagnosis not present

## 2018-11-28 ENCOUNTER — Encounter: Payer: Self-pay | Admitting: Neurology

## 2018-11-28 NOTE — Progress Notes (Signed)
Office Visit Note  Patient: Christina Deleon             Date of Birth: 12/04/1950           MRN: [id number]             PCP: Wenda Low, MD Referring: Lanice Shirts, * Visit Date: 12/12/2018 Occupation: @GUAROCC @  Subjective:  Neck and lower back pain.   History of Present Illness: Christina Deleon is a 68 y.o. female with history of osteoarthritis and fibromyalgia.  She continues to have some discomfort from osteoarthritis and fibromyalgia.  She states she takes medications as needed for flares.  She states she was doing fairly well until this morning she woke up with pain and spasm in her left side which she describes in the left trapezius area and left lower back.  She denies any history of injury or lifting anything heavy.  She continues to have insomnia.  She is currently on Ambien 5 mg for chronic insomnia, nabumetone 500 mg twice daily, methocarbamol 500 mg p.o. daily as needed, and she uses Voltaren gel as needed.  Activities of Daily Living:  Patient reports morning stiffness for 10  minutes.   Patient Reports nocturnal pain.  Lower back pain Difficulty dressing/grooming: Denies Difficulty climbing stairs: Denies Difficulty getting out of chair: Denies Difficulty using hands for taps, buttons, cutlery, and/or writing: Denies  Review of Systems  Constitutional: Positive for fatigue. Negative for night sweats, weight gain and weight loss.  HENT: Negative for mouth sores, trouble swallowing, trouble swallowing, mouth dryness and nose dryness.   Eyes: Positive for dryness. Negative for pain, redness and visual disturbance.  Respiratory: Negative for cough, shortness of breath and difficulty breathing.   Cardiovascular: Negative for chest pain, palpitations, hypertension, irregular heartbeat and swelling in legs/feet.  Gastrointestinal: Positive for constipation. Negative for blood in stool and diarrhea.  Endocrine: Negative for increased urination.  Genitourinary: Negative  for vaginal dryness.  Musculoskeletal: Positive for arthralgias, joint pain, myalgias, morning stiffness and myalgias. Negative for joint swelling, muscle weakness and muscle tenderness.  Skin: Negative for color change, rash, hair loss, skin tightness, ulcers and sensitivity to sunlight.  Allergic/Immunologic: Negative for susceptible to infections.  Neurological: Negative for dizziness, memory loss, night sweats and weakness.  Hematological: Negative for swollen glands.  Psychiatric/Behavioral: Positive for sleep disturbance. Negative for depressed mood. The patient is not nervous/anxious.     PMFS History:  Patient Active Problem List   Diagnosis Date Noted  . Cough variant asthma vs uacs  04/13/2018  . Fibromyalgia 04/19/2016  . Other fatigue 04/19/2016  . 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 Primary osteoarthritis of both knees 04/19/2016  . Leg pain 01/22/2016  . Restless legs syndrome 01/22/2016  . Snoring 11/19/2015  . Periodic limb movement sleep disorder 11/19/2015  . GERD (gastroesophageal reflux disease) 10/08/2015  . Mild persistent asthma 04/09/2015  . LPRD (laryngopharyngeal reflux disease) 04/09/2015  . Allergic rhinoconjunctivitis 04/09/2015  . HEMATOCHEZIA 04/01/2010  . COUGH 04/01/2010  . GASTRITIS 09/30/2009  . NAUSEA 09/30/2009  . ABDOMINAL PAIN-EPIGASTRIC 09/30/2009  . DYSTHYMIC DISORDER 09/26/2009  . PALPITATIONS 06/17/2009  . Impingement syndrome of shoulder, left 06/04/2009  . CONSTIPATION 02/05/2009  . PERSONAL HX COLONIC POLYPS 02/05/2009  . ASTHMA, PERSISTENT, MILD 02/04/2009  . CHEST PAIN, ATYPICAL 02/04/2009  . COLONIC POLYPS, BENIGN 12/18/2008  . HYPERLIPIDEMIA 12/18/2008  . Insomnia 12/18/2008  . MIGRAINE, COMMON 12/18/2008  . HYPERTENSION 12/18/2008  . ALLERGIC RHINITIS 12/18/2008  .  Primary osteoarthritis of both hands 12/18/2008  . DJD (degenerative joint disease), cervical 12/18/2008  . NECK PAIN, CHRONIC 12/18/2008  . LOW BACK PAIN, CHRONIC  12/18/2008    Past Medical History:  Diagnosis Date  . Adenomatous colon polyp 10/1997, and 03/2009  . Allergic rhinitis   . Allergy   . Arthritis    osteo  . Asthma   . Chronic gastritis   . Fibromyalgia   . GERD (gastroesophageal reflux disease)   . Hemorrhoids   . Hyperlipidemia   . Hypertension   . Menorrhagia    partial hysterectomy  . Migraines   . PONV (postoperative nausea and vomiting)   . Sleep disturbance   . STD (sexually transmitted disease) 01/22/11 culture proven   HSV type I labia    Family History  Problem Relation Age of Onset  . Throat cancer Father   . Coronary artery disease Father   . Heart attack Father 19  . Stroke Father        during procedure  . Coronary artery disease Mother   . Hypertension Mother   . Hyperlipidemia Mother   . Pulmonary fibrosis Mother   . Lung disease Mother   . Prostate cancer Brother   . Heart disease Brother   . Diabetes Brother   . Diabetes Paternal Aunt        x 2  . Diabetes Paternal Grandmother   . Coronary artery disease Paternal Grandmother   . Stroke Maternal Grandmother        or MI  . Prostate cancer Paternal Uncle   . Migraines Daughter   . Migraines Daughter   . Colon cancer Neg Hx    Past Surgical History:  Procedure Laterality Date  . CATARACT EXTRACTION Bilateral 10/16 & 11/16  . EXCISION METACARPAL MASS Right 11/16/2016   Procedure: RIGHT LITTLE FINGER CYST EXCISION;  Surgeon: Milly Jakob, MD;  Location: [facility];  Service: Orthopedics;  Laterality: Right;  . TONSILLECTOMY  age 42  . tooth extract     with bone graft  . Turbinate sinus surgery  2003  . VAGINAL HYSTERECTOMY  1998   secondary to prolapse, ovaries remain   Social History   Social History Narrative   She lives with husband.  They have 2 grown children.   She is retired Product/process development scientist.   Highest level of education:  2 years of college   Right handed   Regular exercise, diet of fruits, veggies,  limited fried foods, limited water      [facility]:   Originally from [place]. Has also lived in [place]. Previously worked doing Software engineer. No pets currently. No bird, mold, or hot tub exposure. Does have a musty smell in their home but it is new. Enjoys reading. 1 indoor plant. Has carpet in her home including in the bedroom. Also has draperies.    Immunization History  Administered Date(s) Administered  . Influenza, Seasonal, Injecte, Preservative Fre 04/09/2015  . Influenza,inj,Quad PF,6+ Mos 01/14/2016  . Pneumococcal Conjugate-13 11/25/2015  . Pneumococcal Polysaccharide-23 11/20/2013  . Tdap 11/20/2009     Objective: Vital Signs: BP 136/82 (BP Location: Right Arm, Patient Position: Sitting, Cuff Size: Normal)   Pulse 84   Resp 12   Ht 5\' 4"  (1.626 m)   Wt 156 lb (70.8 kg)   LMP 07/02/1994   BMI 26.78 kg/m    Physical Exam Vitals signs and nursing note reviewed.  Constitutional:  Appearance: She is well-developed.  HENT:     Head: Normocephalic and atraumatic.  Eyes:     Conjunctiva/sclera: Conjunctivae normal.  Neck:     Musculoskeletal: Normal range of motion.  Cardiovascular:     Rate and Rhythm: Normal rate and regular rhythm.     Heart sounds: Normal heart sounds.  Pulmonary:     Effort: Pulmonary effort is normal.     Breath sounds: Normal breath sounds.  Abdominal:     General: Bowel sounds are normal.     Palpations: Abdomen is soft.  Lymphadenopathy:     Cervical: No cervical adenopathy.  Skin:    General: Skin is warm and dry.     Capillary Refill: Capillary refill takes less than 2 seconds.  Neurological:     Mental Status: She is alert and oriented to person, place, and time.  Psychiatric:        Behavior: Behavior normal.      Musculoskeletal Exam: Patient has limited range of motion of her cervical spine with discomfort.  She had radiculopathy to the left infrascapular region on forward flexion.  She had  discomfort range of motion of her lumbar spine.  Shoulder joints, elbow joints with good range of motion.  She has some DIP and PIP thickening in her hands.  Hip joints, knee joints, ankles with good range of motion with no synovitis.  She has some generalized hyperalgesia and positive tender points.  She had tenderness on palpation over the left trapezius area.  CDAI Exam: CDAI Score: - Patient Global: -; Provider Global: - Swollen: -; Tender: - Joint Exam   No joint exam has been documented for this visit   There is currently no information documented on the homunculus. Go to the Rheumatology activity and complete the homunculus joint exam.  Investigation: No additional findings.  Imaging: No results found.  Recent Labs: Lab Results  Component Value Date   WBC 4.2 10/20/2017   HGB 13.5 10/20/2017   PLT 349 10/20/2017   NA 142 10/20/2017   K 4.5 10/20/2017   CL 106 10/20/2017   CO2 29 10/20/2017   GLUCOSE 84 10/20/2017   BUN 15 10/20/2017   CREATININE 0.90 10/20/2017   BILITOT 0.4 10/20/2017   ALKPHOS 69 06/23/2009   AST 14 10/20/2017   ALT 9 10/20/2017   PROT 6.7 10/20/2017   ALBUMIN 3.9 06/23/2009   CALCIUM 9.7 10/20/2017   GFRAA 77 10/20/2017    Speciality Comments: No specialty comments available.  Procedures:  Trigger Point Inj  Date/Time: 12/12/2018 10:57 AM Performed by: Bo Merino, MD Authorized by: Bo Merino, MD   Consent Given by:  Patient Site marked: the procedure site was marked   Timeout: prior to procedure the correct patient, procedure, and site was verified   Indications:  Muscle spasm and pain Total # of Trigger Points:  1 Location: neck   Needle Size:  27 G Approach:  Dorsal Medications #1:  0.5 mL lidocaine 1 %; 10 mg triamcinolone acetonide 40 MG/ML Patient tolerance:  Patient tolerated the procedure well with no immediate complications   Allergies: Trazodone hcl, Clindamycin/lincomycin, and Penicillins   Assessment /  Plan:     Visit Diagnoses: Pain, neck -patient has known history of degenerative disease of cervical spine.  She has been experiencing increased pain in her cervical spine now with left-sided radiculopathy.  I have given her a handout on neck exercises.  Plan: XR Cervical Spine 2 or 3 views, she had multilevel spondylosis  and facet joint arthropathy.  The x-ray findings are discussed with the patient.  She also had some tenderness over the left trapezius area.  Per her request and after discussing different treatment options the left trapezius was injected with cortisone as described above.  I have advised her to contact me in case her symptoms do not improve.  She will also try Robaxin and methocarbamol for the next few days.  DDD (degenerative disc disease), cervical -chronic pain  DDD lumbar spine -she continues to have chronic discomfort in her lower back.  She also complains of some nocturnal pain.  She states she takes NSAIDs on a as needed basis.  Primary osteoarthritis of both hands -she has some DIP and PIP thickening.  Joint protection was discussed.  Primary osteoarthritis of both knees -she has some discomfort in her knee joints.  Impingement syndrome of shoulder, left -she has chronic discomfort and discomfort with internal rotation.  Fibromyalgia -she takes Robaxin on PRN basis.  Other insomnia - Ambien 5 mg po daily at bedtime.  Patient reports having insomnia despite taking Ambien.  Other fatigue -due to insomnia and fibromyalgia.  Medication monitoring encounter - Plan: CBC with Differential/Platelet, COMPLETE METABOLIC PANEL WITH GFR, patient has been on many medications including NSAIDs.  History of hypertension -blood pressure is mildly elevated today.  History of gastroesophageal reflux (GERD) -she was advised to take NSAIDs only with food and to watch for worsening of reflux symptoms.  History of hyperlipidemia   History of asthma   History of migraine   History  of colon polyps     Orders: Orders Placed This Encounter  Procedures  . XR Cervical Spine 2 or 3 views  . CBC with Differential/Platelet  . COMPLETE METABOLIC PANEL WITH GFR   No orders of the defined types were placed in this encounter.   Face-to-face time spent with patient was 30 minutes. Greater than 50% of time was spent in counseling and coordination of care.  Follow-Up Instructions: Return in about 6 months (around 06/14/2019) for Osteoarthritis.   Bo Merino, MD  Note - This record has been created using Editor, commissioning.  Chart creation errors have been sought, but may not always  have been located. Such creation errors do not reflect on  the standard of medical care.

## 2018-11-29 ENCOUNTER — Other Ambulatory Visit: Payer: Self-pay

## 2018-11-29 ENCOUNTER — Telehealth (INDEPENDENT_AMBULATORY_CARE_PROVIDER_SITE_OTHER): Payer: Medicare Other | Admitting: Neurology

## 2018-11-29 VITALS — Ht 64.0 in | Wt 168.0 lb

## 2018-11-29 DIAGNOSIS — G43101 Migraine with aura, not intractable, with status migrainosus: Secondary | ICD-10-CM | POA: Diagnosis not present

## 2018-11-29 DIAGNOSIS — G4761 Periodic limb movement disorder: Secondary | ICD-10-CM | POA: Diagnosis not present

## 2018-11-29 MED ORDER — ZOLMITRIPTAN 5 MG PO TBDP: 5.0000 mg | ORAL_TABLET | ORAL | 11 refills | Status: DC | PRN

## 2018-11-29 NOTE — Progress Notes (Signed)
   Virtual Visit via Video Note The purpose of this virtual visit is to provide medical care while limiting exposure to the novel coronavirus.    Consent was obtained for video visit:  Yes.   Answered questions that patient had about telehealth interaction:  Yes.   I discussed the limitations, risks, security and privacy concerns of performing an evaluation and management service by telemedicine. I also discussed with the patient that there may be a patient responsible charge related to this service. The patient expressed understanding and agreed to proceed.  Pt location: Home Physician Location: office Name of referring provider:  Lanice Shirts, * I connected with Christina Deleon at patients initiation/request on 11/29/2018 at  9:50 AM EDT by video enabled telemedicine application and verified that I am speaking with the correct person using two identifiers. Pt MRN:  [id number] Pt DOB:  1950-12-16 Video Participants:  Christina Deleon   History of Present Illness: This is a 68 y.o. female returning for follow-up of migraine and periodic limb movements of sleep.  She continues to have headaches several times per month, she feels near current cycles and is triggered by stress.  She does get significant relief with Zomig 2.5 to 5 mg which she takes about 4 times per month.  She does not take other over-the-counter medications to treat her headaches. Her 69 year-old mother is at a [facility] facility and has not been able to see her in four months and her husband is also having medical issues over the past 3 months.  Her limb movements have slightly increased over the last month especially in her left leg.  She is still able to fall asleep again and takes Robaxin as needed which helps.   Observations/Objective:   Vitals:   11/28/18 1310  Weight: 168 lb (76.2 kg)  Height: 5\' 4"  (1.626 m)   Patient is awake, alert, and appears comfortable.  Oriented x 4.   Extraocular muscles are intact. No ptosis.   Face is symmetric.  Speech is not dysarthric.  Antigravity in all extremities.     Assessment and Plan:  1.  Episodic migraine, triggered by stressed and occurring 3-4 times per month.  -Responsive to Zomig 2.5mg  - 5mg  which will be continued.  Refills provided.  2.  Periodic limb movements of sleep, overall stable  -Continue iron supplements and Robaxin as needed   Follow Up Instructions:   I discussed the assessment and treatment plan with the patient. The patient was provided an opportunity to ask questions and all were answered. The patient agreed with the plan and demonstrated an understanding of the instructions.   The patient was advised to call back or seek an in-person evaluation if the symptoms worsen or if the condition fails to improve as anticipated.  Follow-up in 1 year   Alda Berthold, DO

## 2018-12-04 ENCOUNTER — Other Ambulatory Visit: Payer: Self-pay | Admitting: Physician Assistant

## 2018-12-04 NOTE — Telephone Encounter (Signed)
Last Visit: 06/13/2018 Next Visit: 12/12/2018  Last fill: 10/11/2018   Okay to refill Lorrin Mais?

## 2018-12-05 DIAGNOSIS — H26493 Other secondary cataract, bilateral: Secondary | ICD-10-CM | POA: Diagnosis not present

## 2018-12-05 DIAGNOSIS — H04123 Dry eye syndrome of bilateral lacrimal glands: Secondary | ICD-10-CM | POA: Diagnosis not present

## 2018-12-05 DIAGNOSIS — Z961 Presence of intraocular lens: Secondary | ICD-10-CM | POA: Diagnosis not present

## 2018-12-05 DIAGNOSIS — H40013 Open angle with borderline findings, low risk, bilateral: Secondary | ICD-10-CM | POA: Diagnosis not present

## 2018-12-07 DIAGNOSIS — W57XXXA Bitten or stung by nonvenomous insect and other nonvenomous arthropods, initial encounter: Secondary | ICD-10-CM | POA: Diagnosis not present

## 2018-12-07 DIAGNOSIS — L539 Erythematous condition, unspecified: Secondary | ICD-10-CM | POA: Diagnosis not present

## 2018-12-12 ENCOUNTER — Ambulatory Visit (INDEPENDENT_AMBULATORY_CARE_PROVIDER_SITE_OTHER): Payer: Medicare Other | Admitting: Rheumatology

## 2018-12-12 ENCOUNTER — Ambulatory Visit (INDEPENDENT_AMBULATORY_CARE_PROVIDER_SITE_OTHER): Payer: Medicare Other

## 2018-12-12 ENCOUNTER — Other Ambulatory Visit: Payer: Self-pay

## 2018-12-12 ENCOUNTER — Encounter: Payer: Self-pay | Admitting: Rheumatology

## 2018-12-12 VITALS — BP 136/82 | HR 84 | Resp 12 | Ht 64.0 in | Wt 156.0 lb

## 2018-12-12 DIAGNOSIS — M797 Fibromyalgia: Secondary | ICD-10-CM

## 2018-12-12 DIAGNOSIS — Z8679 Personal history of other diseases of the circulatory system: Secondary | ICD-10-CM | POA: Diagnosis not present

## 2018-12-12 DIAGNOSIS — Z5181 Encounter for therapeutic drug level monitoring: Secondary | ICD-10-CM

## 2018-12-12 DIAGNOSIS — R5383 Other fatigue: Secondary | ICD-10-CM

## 2018-12-12 DIAGNOSIS — Z8709 Personal history of other diseases of the respiratory system: Secondary | ICD-10-CM | POA: Diagnosis not present

## 2018-12-12 DIAGNOSIS — Z8719 Personal history of other diseases of the digestive system: Secondary | ICD-10-CM | POA: Diagnosis not present

## 2018-12-12 DIAGNOSIS — M7542 Impingement syndrome of left shoulder: Secondary | ICD-10-CM | POA: Diagnosis not present

## 2018-12-12 DIAGNOSIS — M5030 Other cervical disc degeneration, unspecified cervical region: Secondary | ICD-10-CM | POA: Diagnosis not present

## 2018-12-12 DIAGNOSIS — M47816 Spondylosis without myelopathy or radiculopathy, lumbar region: Secondary | ICD-10-CM

## 2018-12-12 DIAGNOSIS — Z8669 Personal history of other diseases of the nervous system and sense organs: Secondary | ICD-10-CM

## 2018-12-12 DIAGNOSIS — Z8639 Personal history of other endocrine, nutritional and metabolic disease: Secondary | ICD-10-CM | POA: Diagnosis not present

## 2018-12-12 DIAGNOSIS — M170 Bilateral primary osteoarthritis of knee: Secondary | ICD-10-CM

## 2018-12-12 DIAGNOSIS — G4709 Other insomnia: Secondary | ICD-10-CM

## 2018-12-12 DIAGNOSIS — M19041 Primary osteoarthritis, right hand: Secondary | ICD-10-CM

## 2018-12-12 DIAGNOSIS — M542 Cervicalgia: Secondary | ICD-10-CM

## 2018-12-12 DIAGNOSIS — Z860100 Personal history of colon polyps, unspecified: Secondary | ICD-10-CM

## 2018-12-12 DIAGNOSIS — M19042 Primary osteoarthritis, left hand: Secondary | ICD-10-CM

## 2018-12-12 MED ORDER — TRIAMCINOLONE ACETONIDE 40 MG/ML IJ SUSP
10.0000 mg | INTRAMUSCULAR | Status: AC | PRN
  Administered 2018-12-12: 10 mg via INTRAMUSCULAR

## 2018-12-12 MED ORDER — LIDOCAINE HCL 1 % IJ SOLN
0.5000 mL | INTRAMUSCULAR | Status: AC | PRN
  Administered 2018-12-12: .5 mL

## 2018-12-12 NOTE — Patient Instructions (Signed)

## 2018-12-13 LAB — COMPLETE METABOLIC PANEL WITH GFR
AG Ratio: 1.5 (calc) (ref 1.0–2.5)
ALT: 13 U/L (ref 6–29)
AST: 16 U/L (ref 10–35)
Albumin: 4 g/dL (ref 3.6–5.1)
Alkaline phosphatase (APISO): 63 U/L (ref 37–153)
BUN: 13 mg/dL (ref 7–25)
CO2: 26 mmol/L (ref 20–32)
Calcium: 9.8 mg/dL (ref 8.6–10.4)
Chloride: 107 mmol/L (ref 98–110)
Creat: 0.82 mg/dL (ref 0.50–0.99)
GFR, Est African American: 85 mL/min/{1.73_m2} (ref >=60)
GFR, Est Non African American: 74 mL/min/{1.73_m2} (ref >=60)
Globulin: 2.7 g/dL (calc) (ref 1.9–3.7)
Glucose, Bld: 96 mg/dL (ref 65–99)
Potassium: 4.3 mmol/L (ref 3.5–5.3)
Sodium: 144 mmol/L (ref 135–146)
Total Bilirubin: 0.3 mg/dL (ref 0.2–1.2)
Total Protein: 6.7 g/dL (ref 6.1–8.1)

## 2018-12-13 LAB — CBC WITH DIFFERENTIAL/PLATELET
Absolute Monocytes: 431 cells/uL (ref 200–950)
Basophils Absolute: 40 cells/uL (ref 0–200)
Basophils Relative: 0.9 %
Eosinophils Absolute: 132 cells/uL (ref 15–500)
Eosinophils Relative: 3 %
HCT: 40.5 % (ref 35.0–45.0)
Hemoglobin: 13.7 g/dL (ref 11.7–15.5)
Lymphs Abs: 1355 cells/uL (ref 850–3900)
MCH: 30 pg (ref 27.0–33.0)
MCHC: 33.8 g/dL (ref 32.0–36.0)
MCV: 88.8 fL (ref 80.0–100.0)
MPV: 9 fL (ref 7.5–12.5)
Monocytes Relative: 9.8 %
Neutro Abs: 2442 cells/uL (ref 1500–7800)
Neutrophils Relative %: 55.5 %
Platelets: 324 10*3/uL (ref 140–400)
RBC: 4.56 10*6/uL (ref 3.80–5.10)
RDW: 12.8 % (ref 11.0–15.0)
Total Lymphocyte: 30.8 %
WBC: 4.4 10*3/uL (ref 3.8–10.8)

## 2018-12-13 NOTE — Progress Notes (Signed)
wnl

## 2018-12-19 ENCOUNTER — Ambulatory Visit: Payer: Medicare Other | Admitting: Allergy and Immunology

## 2018-12-21 ENCOUNTER — Ambulatory Visit (INDEPENDENT_AMBULATORY_CARE_PROVIDER_SITE_OTHER): Payer: Medicare Other | Admitting: Allergy and Immunology

## 2018-12-21 ENCOUNTER — Encounter: Payer: Self-pay | Admitting: Allergy and Immunology

## 2018-12-21 ENCOUNTER — Other Ambulatory Visit: Payer: Self-pay

## 2018-12-21 DIAGNOSIS — J3089 Other allergic rhinitis: Secondary | ICD-10-CM

## 2018-12-21 DIAGNOSIS — R442 Other hallucinations: Secondary | ICD-10-CM | POA: Diagnosis not present

## 2018-12-21 DIAGNOSIS — J4540 Moderate persistent asthma, uncomplicated: Secondary | ICD-10-CM | POA: Diagnosis not present

## 2018-12-21 DIAGNOSIS — K219 Gastro-esophageal reflux disease without esophagitis: Secondary | ICD-10-CM | POA: Diagnosis not present

## 2018-12-21 NOTE — Progress Notes (Signed)
[facility]   Follow-up Note  Referring Provider: Lanice Shirts, * Primary Provider: Wenda Low, MD Date of Office Visit: 12/21/2018  Subjective:   Christina Deleon (DOB: 04-15-1951) is a 68 y.o. female who returns to the [facility] on 12/21/2018 in re-evaluation of the following:  HPI: This is a E-med visit requested by patient who is located at home.  Christina Deleon is followed for this clinic for asthma and allergic rhinitis and LPR.  I last saw her in his clinic on 14 November 2018.  Doing much better from treatment in July which included a systemic steroid and antibiotic. Presently her requirement for SABA is one time per week. No sob or chest tightness.  No phantosmia or nasal congestion.  Still with spasmatic coughing with throat clearing. Presently using a PPI and a H2 receptor blocker for reflux. Has already seen ENT and Pulmonology.  Does not consume caffeine or chocolate.  Allergies as of 12/21/2018      Reactions   Trazodone Hcl Other (See Comments)   REACTION: bad headaches   Clindamycin/lincomycin Other (See Comments)   headaches   Penicillins Nausea Only, Rash   Patient has taken Amoxicillin without an issue, per patient. Has patient had a PCN reaction causing immediate rash, facial/tongue/throat swelling, SOB or lightheadedness with hypotension: No Has patient had a PCN reaction causing severe rash involving mucus membranes or skin necrosis: No Has patient had a PCN reaction that required hospitalization No Has patient had a PCN reaction occurring within the last 10 years: No If all of the above answers are "NO", then may proceed with Cephalosporin use.      Medication List    albuterol 108 (90 Base) MCG/ACT inhaler Commonly known as: ProAir HFA Inhale 2 puffs into the lungs every 6 (six) hours as needed for wheezing or shortness of breath.   ALPRAZolam 0.5 MG tablet Commonly known as: XANAX as  needed.   Azelastine-Fluticasone 137-50 MCG/ACT Susp Place 1 spray into the nose daily.   betamethasone valerate ointment 0.1 % Commonly known as: VALISONE Place a pea sized amount topically BID for one to weeks as needed. Not for daily long term use   CALCIUM PO Take 1 tablet by mouth daily.   candesartan 16 MG tablet Commonly known as: ATACAND Take 8 mg by mouth at bedtime.   cetirizine 10 MG tablet Commonly known as: ZYRTEC Take 1 tablet (10 mg total) by mouth daily. One tab daily for allergies   diclofenac sodium 1 % Gel Commonly known as: VOLTAREN Apply 3 grams to 3 large joints up to 3 times daily   Dulera 100-5 MCG/ACT Aero Generic drug: mometasone-formoterol Inhale 2 puffs into the lungs 2 (two) times daily.   famotidine 40 MG tablet Commonly known as: Pepcid Take 1 tablet (40 mg total) by mouth at bedtime.   fluconazole 150 MG tablet Commonly known as: Diflucan Take 1 tablet (150 mg total) by mouth once a week.   methocarbamol 500 MG tablet Commonly known as: ROBAXIN TAKE 1 TABLET(500 MG) BY MOUTH DAILY AS NEEDED FOR MUSCLE SPASMS   montelukast 10 MG tablet Commonly known as: SINGULAIR TAKE 1 TABLET BY MOUTH ONCE DAILY   multivitamin tablet Take 1 tablet by mouth daily. Calcium, Vitamin B-12, Vitamin A, Vitamin C   nabumetone 500 MG tablet Commonly known as: RELAFEN Take 1 tablet (500 mg total) by mouth 2 (two) times daily.   pantoprazole 40  MG tablet Commonly known as: PROTONIX Take 1 tablet (40 mg total) by mouth 2 (two) times a day.   polyethylene glycol powder 17 GM/SCOOP powder Commonly known as: GLYCOLAX/MIRALAX Take 17 g by mouth daily.   pravastatin 20 MG tablet Commonly known as: PRAVACHOL Take 20 mg by mouth daily.   VITAMIN A PO Take by mouth daily.   VITAMIN B-12 PO Take 1 tablet by mouth daily.   VITAMIN C PO Take 1 tablet by mouth daily.   Vitamin D 50 MCG (2000 UT) tablet Take 2,000 Units by mouth daily.   Xiidra 5 %  Soln Generic drug: Lifitegrast Apply 1 drop to eye 2 (two) times daily.   zolmitriptan 5 MG disintegrating tablet Commonly known as: ZOMIG-ZMT Take 1 tablet (5 mg total) by mouth as needed for migraine.   zolpidem 5 MG tablet Commonly known as: AMBIEN TAKE 1 TO 2 TABLETS BY MOUTH AT BEDTIME AS NEEDED       Past Medical History:  Diagnosis Date  . Adenomatous colon polyp 10/1997, and 03/2009  . Allergic rhinitis   . Allergy   . Arthritis    osteo  . Asthma   . Chronic gastritis   . Fibromyalgia   . GERD (gastroesophageal reflux disease)   . Hemorrhoids   . Hyperlipidemia   . Hypertension   . Menorrhagia    partial hysterectomy  . Migraines   . PONV (postoperative nausea and vomiting)   . Sleep disturbance   . 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 CATARACT EXTRACTION Bilateral 10/16 & 11/16  . EXCISION METACARPAL MASS Right 11/16/2016   Procedure: RIGHT LITTLE FINGER CYST EXCISION;  Surgeon: Milly Jakob, MD;  Location: [facility];  Service: Orthopedics;  Laterality: Right;  . TONSILLECTOMY  age 10  . tooth extract     with bone graft  . Turbinate sinus surgery  2003  . VAGINAL HYSTERECTOMY  1998   secondary to prolapse, ovaries remain    Review of systems negative except as noted in HPI / PMHx or noted below:  Review of Systems  Constitutional: Negative.   HENT: Negative.   Eyes: Negative.   Respiratory: Negative.   Cardiovascular: Negative.   Gastrointestinal: Negative.   Genitourinary: Negative.   Musculoskeletal: Negative.   Skin: Negative.   Neurological: Negative.   Endo/Heme/Allergies: Negative.   Psychiatric/Behavioral: Negative.      Objective:   There were no vitals filed for this visit.        Physical Exam-deferred  Diagnostics: none  Assessment and Plan:   1. Asthma, moderate persistent, well-controlled   2. Other  allergic rhinitis   3. Phantosmia   4. LPRD (laryngopharyngeal reflux disease)     1. Can increase Pantoprazole 40mg  to twice a day and continue famotidine 40 mg in evening   2. Continue Dulera 100 - two inhalations 2 times per day  3. Continue Dymista one spray each nostril two times per day  4. Continue montelukast 10mg  one time per day  5. Continue ProAir HFA and Zyrtec and nasal saline as needed  6.  Return to clinic in 6 months or earlier if problem  7. Obtain fall flu vaccine (and COVID vaccine)  Stormi appears to be doing very well at this point regarding her asthma and allergic rhinitis on her current medical therapy.  She still has spasmodic coughing associated with  throat clearing which is probably tied up with her LPR.  She does have the option of increasing her proton pump inhibitor to twice a day and continuing on her famotidine in the evening for a four-week block in time to see if this increased reflux therapy helps her.  If it does indeed help her regarding this issue then she can continue on this for 6 months or so.  I will see her back in this clinic in 6 months or earlier if there is a problem.  Allena Katz, MD Allergy / Immunology [facility]

## 2018-12-21 NOTE — Patient Instructions (Addendum)
  1. Can increase Pantoprazole 40mg  to twice a day and continue famotidine 40 mg in evening   2. Continue Dulera 100 - two inhalations 2 times per day  3. Continue Dymista one spray each nostril two times per day  4. Continue montelukast 10mg  one time per day  5. Continue ProAir HFA and Zyrtec and nasal saline as needed  6.  Return to clinic in 6 months or earlier if problem  7. Obtain fall flu vaccine (and COVID vaccine)

## 2018-12-25 ENCOUNTER — Encounter: Payer: Self-pay | Admitting: Allergy and Immunology

## 2018-12-29 NOTE — Progress Notes (Signed)
Telephone Visit  Patient is at home.  Provider is in the office. Start time: 4:22 pm End time: 4:48 pm Verbal consent was given to file insurance.

## 2019-01-04 ENCOUNTER — Other Ambulatory Visit: Payer: Self-pay | Admitting: Allergy and Immunology

## 2019-01-05 ENCOUNTER — Other Ambulatory Visit: Payer: Self-pay

## 2019-01-05 MED ORDER — MONTELUKAST SODIUM 10 MG PO TABS: 10.0000 mg | ORAL_TABLET | Freq: Every day | ORAL | 1 refills | Status: DC

## 2019-01-29 ENCOUNTER — Other Ambulatory Visit: Payer: Self-pay | Admitting: Physician Assistant

## 2019-01-29 NOTE — Telephone Encounter (Addendum)
Last Visit: 12/12/18 Next Visit: 06/12/19  Okay to refill Ambien?

## 2019-02-05 DIAGNOSIS — M519 Unspecified thoracic, thoracolumbar and lumbosacral intervertebral disc disorder: Secondary | ICD-10-CM | POA: Diagnosis not present

## 2019-02-05 DIAGNOSIS — M545 Low back pain: Secondary | ICD-10-CM | POA: Diagnosis not present

## 2019-02-08 DIAGNOSIS — Z23 Encounter for immunization: Secondary | ICD-10-CM | POA: Diagnosis not present

## 2019-02-14 ENCOUNTER — Encounter: Payer: Self-pay | Admitting: Gastroenterology

## 2019-02-26 ENCOUNTER — Encounter: Payer: Self-pay | Admitting: Rheumatology

## 2019-02-26 DIAGNOSIS — E559 Vitamin D deficiency, unspecified: Secondary | ICD-10-CM | POA: Diagnosis not present

## 2019-02-26 DIAGNOSIS — Z0000 Encounter for general adult medical examination without abnormal findings: Secondary | ICD-10-CM | POA: Diagnosis not present

## 2019-02-26 DIAGNOSIS — I10 Essential (primary) hypertension: Secondary | ICD-10-CM | POA: Diagnosis not present

## 2019-02-26 DIAGNOSIS — E785 Hyperlipidemia, unspecified: Secondary | ICD-10-CM | POA: Diagnosis not present

## 2019-02-26 DIAGNOSIS — G43909 Migraine, unspecified, not intractable, without status migrainosus: Secondary | ICD-10-CM | POA: Diagnosis not present

## 2019-02-26 DIAGNOSIS — K219 Gastro-esophageal reflux disease without esophagitis: Secondary | ICD-10-CM | POA: Diagnosis not present

## 2019-02-26 DIAGNOSIS — Z1389 Encounter for screening for other disorder: Secondary | ICD-10-CM | POA: Diagnosis not present

## 2019-02-26 DIAGNOSIS — F419 Anxiety disorder, unspecified: Secondary | ICD-10-CM | POA: Diagnosis not present

## 2019-02-26 DIAGNOSIS — J4530 Mild persistent asthma, uncomplicated: Secondary | ICD-10-CM | POA: Diagnosis not present

## 2019-02-26 DIAGNOSIS — M519 Unspecified thoracic, thoracolumbar and lumbosacral intervertebral disc disorder: Secondary | ICD-10-CM | POA: Diagnosis not present

## 2019-02-26 DIAGNOSIS — M797 Fibromyalgia: Secondary | ICD-10-CM | POA: Diagnosis not present

## 2019-03-05 ENCOUNTER — Other Ambulatory Visit: Payer: Self-pay

## 2019-03-06 NOTE — Progress Notes (Signed)
68 y.o. G19P2002 Married Black or [place] American Not Hispanic or Latino female here for annual exam.  She states that she feels as though her bladder has prolapsed more since she was last seen. Last year she had a grade 2 cystocele, grade 1-2 rectocele and grade one vault prolapse. She notices it some when she is walking, not bothering her. Voiding okay, BM okay, she reports BM ~q2days. Hard to feel clean after BM. Not currently sexually active secondary to husbands illness.   Husband has been ill since March, has PVC, very anxious with the pandemic. He has lost 40 lbs, starting to do better.   She has been under stress with her Mom, her husband, the pandemic, the racial tension and political issues.     Patient's last menstrual period was 07/02/1994.          Sexually active: No.  The current method of family planning is status post hysterectomy.    Exercising: No.  The patient has a physically strenuous job, but has no regular exercise apart from work.  Smoker:  no  Health Maintenance: Pap:  Patient unsure  History of abnormal Pap:  no MMG:  03/01/17 Bi-rads 1 neg , she had one last year, due in 12/20 BMD:   04/11/14, osteopenia, She is having another one in 12/20 with primary Colonoscopy: 05/18/13 WNL f/u 5 years, was told she can wait for 7 years.  TDaP:  2011 Gardasil: NA   reports that she has never smoked. She has never used smokeless tobacco. She reports that she does not drink alcohol or use drugs. 2 grown children, one in [place] and one in [place]. 2 step children.  4 grand children,  7 great grand children (between her and her husband). First husband died of CLL, he was her first love, [place] vet. Married to her second husband for 15 years.  Mom is 54, now in a nursing home, lives about 3.5 hours away.   Past Medical History:  Diagnosis Date  . Adenomatous colon polyp 10/1997, and 03/2009  . Allergic rhinitis   . Allergy   . Arthritis    osteo  . Asthma   . Chronic gastritis    . Fibromyalgia   . GERD (gastroesophageal reflux disease)   . Hemorrhoids   . Hyperlipidemia   . Hypertension   . Menorrhagia    partial hysterectomy  . Migraines   . PONV (postoperative nausea and vomiting)   . Sleep disturbance   . 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 CATARACT EXTRACTION Bilateral 10/16 & 11/16  . EXCISION METACARPAL MASS Right 11/16/2016   Procedure: RIGHT LITTLE FINGER CYST EXCISION;  Surgeon: Milly Jakob, MD;  Location: [facility];  Service: Orthopedics;  Laterality: Right;  . TONSILLECTOMY  age 61  . tooth extract     with bone graft  . Turbinate sinus surgery  2003  . VAGINAL HYSTERECTOMY  1998   secondary to prolapse, ovaries remain    Current Outpatient Medications  Medication Sig Dispense Refill  . albuterol (PROAIR HFA) 108 (90 Base) MCG/ACT inhaler Inhale 2 puffs into the lungs every 6 (six) hours as needed for wheezing or shortness of breath. 18 g 1  . ALPRAZolam (XANAX) 0.5 MG tablet as needed.    . Ascorbic Acid (VITAMIN C PO) Take 1 tablet by mouth daily.    . Azelastine-Fluticasone 137-50 MCG/ACT  SUSP Place 1 spray into the nose daily. 23 g 5  . betamethasone valerate ointment (VALISONE) 0.1 % Place a pea sized amount topically BID for one to weeks as needed. Not for daily long term use 30 g 0  . CALCIUM PO Take 1 tablet by mouth daily.    . candesartan (ATACAND) 16 MG tablet Take 8 mg by mouth at bedtime.     . cetirizine (ZYRTEC) 10 MG tablet Take 1 tablet (10 mg total) by mouth daily. One tab daily for allergies 30 tablet 1  . Cholecalciferol (VITAMIN D) 2000 units tablet Take 2,000 Units by mouth daily.    . Cyanocobalamin (VITAMIN B-12 PO) Take 1 tablet by mouth daily.    . diclofenac sodium (VOLTAREN) 1 % GEL Apply 3 grams to 3 large joints up to 3 times daily 3 Tube 3  . famotidine (PEPCID) 40 MG tablet Take 1 tablet (40  mg total) by mouth at bedtime. 90 tablet 1  . fluconazole (DIFLUCAN) 150 MG tablet Take 1 tablet (150 mg total) by mouth once a week. 2 tablet 0  . Lifitegrast (XIIDRA) 5 % SOLN Apply 1 drop to eye 2 (two) times daily.    . methocarbamol (ROBAXIN) 500 MG tablet TAKE 1 TABLET(500 MG) BY MOUTH DAILY AS NEEDED FOR MUSCLE SPASMS 90 tablet 0  . mometasone-formoterol (DULERA) 100-5 MCG/ACT AERO Inhale 2 puffs into the lungs 2 (two) times daily. 13 g 5  . montelukast (SINGULAIR) 10 MG tablet Take 1 tablet (10 mg total) by mouth daily. 90 tablet 1  . Multiple Vitamin (MULTIVITAMIN) tablet Take 1 tablet by mouth daily. Calcium, Vitamin B-12, Vitamin A, Vitamin C    . nabumetone (RELAFEN) 500 MG tablet Take 1 tablet (500 mg total) by mouth 2 (two) times daily. 60 tablet 1  . pantoprazole (PROTONIX) 40 MG tablet Take 1 tablet (40 mg total) by mouth 2 (two) times a day. 180 tablet 1  . polyethylene glycol powder (GLYCOLAX/MIRALAX) powder Take 17 g by mouth daily. (Patient taking differently: Take 17 g by mouth daily as needed. ) 850 g 1  . pravastatin (PRAVACHOL) 20 MG tablet Take 20 mg by mouth daily.      Marland Kitchen VITAMIN A PO Take by mouth daily.    Marland Kitchen zolmitriptan (ZOMIG-ZMT) 5 MG disintegrating tablet Take 1 tablet (5 mg total) by mouth as needed for migraine. 10 tablet 11  . zolpidem (AMBIEN) 5 MG tablet TAKE 1 TO 2 TABLETS BY MOUTH AT BEDTIME AS NEEDED 60 tablet 0   No current facility-administered medications for this visit.     Family History  Problem Relation Age of Onset  . Throat cancer Father   . Coronary artery disease Father   . Heart attack Father 55  . Stroke Father        during procedure  . Coronary artery disease Mother   . Hypertension Mother   . Hyperlipidemia Mother   . Pulmonary fibrosis Mother   . Lung disease Mother   . Prostate cancer Brother   . Heart disease Brother   . Diabetes Brother   . Diabetes Paternal Aunt        x 2  . Diabetes Paternal Grandmother   . Coronary  artery disease Paternal Grandmother   . Stroke Maternal Grandmother        or MI  . Prostate cancer Paternal Uncle   . Migraines Daughter   . Migraines Daughter   . Colon cancer Neg Hx  Review of Systems  All other systems reviewed and are negative.   Exam:   LMP 07/02/1994   Weight change: @WEIGHTCHANGE @ Height:      Ht Readings from Last 3 Encounters:  12/12/18 5\' 4"  (1.626 m)  11/28/18 5\' 4"  (1.626 m)  06/13/18 5\' 4"  (1.626 m)    General appearance: alert, cooperative and appears stated age Head: Normocephalic, without obvious abnormality, atraumatic Neck: no adenopathy, supple, symmetrical, trachea midline and thyroid normal to inspection and palpation Lungs: clear to auscultation bilaterally Cardiovascular: regular rate and rhythm Breasts: normal appearance, no masses or tenderness Abdomen: soft, non-tender; non distended,  no masses,  no organomegaly Extremities: extremities normal, atraumatic, no cyanosis or edema Skin: Skin color, texture, turgor normal. No rashes or lesions Lymph nodes: Cervical, supraclavicular, and axillary nodes normal. No abnormal inguinal nodes palpated Neurologic: Grossly normal   Pelvic: External genitalia:  no lesions              Urethra:  normal appearing urethra with no masses, tenderness or lesions              Bartholins and Skenes: normal                 Vagina: mildly atrophic appearing vagina with normal color and discharge, no lesions  Grade 2 cystocele and now grade 2 rectocele, grade 1 vault prolapse              Cervix: absent               Bimanual Exam:  Uterus:  uterus absent              Adnexa: no mass, fullness, tenderness               Rectovaginal: Confirms               Anus:  normal sphincter tone, no lesions  Chaperone was present for exam.  A:  Well Woman with normal exam  Genital prolapse, tolerable to her. Slightly worsening rectocele since last year. Grade 2 cystocele and rectocele, grade 1 vault  prolapse.   P:   No pap this year  Discussed breast self exam  Discussed calcium and vit D intake  Mammogram scheduled  DEXA scheduled  Colonoscopy UTD  Labs with primary  Information on prolapse given and discussed. Try metamucil to bulk her stool.  Avoid heavy lifting and straining.

## 2019-03-07 ENCOUNTER — Other Ambulatory Visit: Payer: Self-pay

## 2019-03-07 ENCOUNTER — Ambulatory Visit (INDEPENDENT_AMBULATORY_CARE_PROVIDER_SITE_OTHER): Payer: Medicare Other | Admitting: Obstetrics and Gynecology

## 2019-03-07 ENCOUNTER — Encounter: Payer: Self-pay | Admitting: Obstetrics and Gynecology

## 2019-03-07 VITALS — BP 130/74 | HR 86 | Temp 97.4°F | Ht 63.5 in | Wt 156.0 lb

## 2019-03-07 DIAGNOSIS — N816 Rectocele: Secondary | ICD-10-CM | POA: Diagnosis not present

## 2019-03-07 DIAGNOSIS — Z01419 Encounter for gynecological examination (general) (routine) without abnormal findings: Secondary | ICD-10-CM

## 2019-03-07 DIAGNOSIS — N8111 Cystocele, midline: Secondary | ICD-10-CM

## 2019-03-07 NOTE — Patient Instructions (Addendum)
EXERCISE AND DIET:  We recommended that you start or continue a regular exercise program for good health. Regular exercise means any activity that makes your heart beat faster and makes you sweat.  We recommend exercising at least 30 minutes per day at least 3 days a week, preferably 4 or 5.  We also recommend a diet low in fat and sugar.  Inactivity, poor dietary choices and obesity can cause diabetes, heart attack, stroke, and kidney damage, among others.   ° °ALCOHOL AND SMOKING:  Women should limit their alcohol intake to no more than 7 drinks/beers/glasses of wine (combined, not each!) per week. Moderation of alcohol intake to this level decreases your risk of breast cancer and liver damage. And of course, no recreational drugs are part of a healthy lifestyle.  And absolutely no smoking or even second hand smoke. Most people know smoking can cause heart and lung diseases, but did you know it also contributes to weakening of your bones? Aging of your skin?  Yellowing of your teeth and nails? ° °CALCIUM AND VITAMIN D:  Adequate intake of calcium and Vitamin D are recommended.  The recommendations for exact amounts of these supplements seem to change often, but generally speaking 1,200 mg of calcium (between diet and supplement) and 800 units of Vitamin D per day seems prudent. Certain women may benefit from higher intake of Vitamin D.  If you are among these women, your doctor will have told you during your visit.   ° °PAP SMEARS:  Pap smears, to check for cervical cancer or precancers,  have traditionally been done yearly, although recent scientific advances have shown that most women can have pap smears less often.  However, every woman still should have a physical exam from her gynecologist every year. It will include a breast check, inspection of the vulva and vagina to check for abnormal growths or skin changes, a visual exam of the cervix, and then an exam to evaluate the size and shape of the uterus and  ovaries.  And after 69 years of age, a rectal exam is indicated to check for rectal cancers. We will also provide age appropriate advice regarding health maintenance, like when you should have certain vaccines, screening for sexually transmitted diseases, bone density testing, colonoscopy, mammograms, etc.  ° °MAMMOGRAMS:  All women over 40 years old should have a yearly mammogram. Many facilities now offer a "3D" mammogram, which may cost around $50 extra out of pocket. If possible,  we recommend you accept the option to have the 3D mammogram performed.  It both reduces the number of women who will be called back for extra views which then turn out to be normal, and it is better than the routine mammogram at detecting truly abnormal areas.   ° °COLON CANCER SCREENING: Now recommend starting at age 45. At this time colonoscopy is not covered for routine screening until 50. There are take home tests that can be done between 45-49.  ° °COLONOSCOPY:  Colonoscopy to screen for colon cancer is recommended for all women at age 50.  We know, you hate the idea of the prep.  We agree, BUT, having colon cancer and not knowing it is worse!!  Colon cancer so often starts as a polyp that can be seen and removed at colonscopy, which can quite literally save your life!  And if your first colonoscopy is normal and you have no family history of colon cancer, most women don't have to have it again for   10 years.  Once every ten years, you can do something that may end up saving your life, right?  We will be happy to help you get it scheduled when you are ready.  Be sure to check your insurance coverage so you understand how much it will cost.  It may be covered as a preventative service at no cost, but you should check your particular policy.   ° ° ° °Breast Self-Awareness °Breast self-awareness means being familiar with how your breasts look and feel. It involves checking your breasts regularly and reporting any changes to your  health care provider. °Practicing breast self-awareness is important. A change in your breasts can be a sign of a serious medical problem. Being familiar with how your breasts look and feel allows you to find any problems early, when treatment is more likely to be successful. All women should practice breast self-awareness, including women who have had breast implants. °How to do a breast self-exam °One way to learn what is normal for your breasts and whether your breasts are changing is to do a breast self-exam. To do a breast self-exam: °Look for Changes ° °1. Remove all the clothing above your waist. °2. Stand in front of a mirror in a room with good lighting. °3. Put your hands on your hips. °4. Push your hands firmly downward. °5. Compare your breasts in the mirror. Look for differences between them (asymmetry), such as: °? Differences in shape. °? Differences in size. °? Puckers, dips, and bumps in one breast and not the other. °6. Look at each breast for changes in your skin, such as: °? Redness. °? Scaly areas. °7. Look for changes in your nipples, such as: °? Discharge. °? Bleeding. °? Dimpling. °? Redness. °? A change in position. °Feel for Changes °Carefully feel your breasts for lumps and changes. It is best to do this while lying on your back on the floor and again while sitting or standing in the shower or tub with soapy water on your skin. Feel each breast in the following way: °· Place the arm on the side of the breast you are examining above your head. °· Feel your breast with the other hand. °· Start in the nipple area and make ¾ inch (2 cm) overlapping circles to feel your breast. Use the pads of your three middle fingers to do this. Apply light pressure, then medium pressure, then firm pressure. The light pressure will allow you to feel the tissue closest to the skin. The medium pressure will allow you to feel the tissue that is a little deeper. The firm pressure will allow you to feel the tissue  close to the ribs. °· Continue the overlapping circles, moving downward over the breast until you feel your ribs below your breast. °· Move one finger-width toward the center of the body. Continue to use the ¾ inch (2 cm) overlapping circles to feel your breast as you move slowly up toward your collarbone. °· Continue the up and down exam using all three pressures until you reach your armpit. ° °Write Down What You Find ° °Write down what is normal for each breast and any changes that you find. Keep a written record with breast changes or normal findings for each breast. By writing this information down, you do not need to depend only on memory for size, tenderness, or location. Write down where you are in your menstrual cycle, if you are still menstruating. °If you are having trouble noticing differences   in your breasts, do not get discouraged. With time you will become more familiar with the variations in your breasts and more comfortable with the exam. How often should I examine my breasts? Examine your breasts every month. If you are breastfeeding, the best time to examine your breasts is after a feeding or after using a breast pump. If you menstruate, the best time to examine your breasts is 5-7 days after your period is over. During your period, your breasts are lumpier, and it may be more difficult to notice changes. When should I see my health care provider? See your health care provider if you notice:  A change in shape or size of your breasts or nipples.  A change in the skin of your breast or nipples, such as a reddened or scaly area.  Unusual discharge from your nipples.  A lump or thick area that was not there before.  Pain in your breasts.  Anything that concerns you.  About Cystocele  Overview  The pelvic organs, including the bladder, are normally supported by pelvic floor muscles and ligaments.  When these muscles and ligaments are stretched, weakened or torn, the wall between  the bladder and the vagina sags or herniates causing a prolapse, sometimes called a cystocele.  This condition may cause discomfort and problems with emptying the bladder.  It can be present in various stages.  Some people are not aware of the changes.  Others may notice changes at the vaginal opening or a feeling of the bladder dropping outside the body.  Causes of a Cystocele  A cystocele is usually caused by muscle straining or stretching during childbirth.  In addition, cystocele is more common after menopause, because the hormone estrogen helps keep the elastic tissues around the pelvic organs strong.  A cystocele is more likely to occur when levels of estrogen decrease.  Other causes include: heavy lifting, chronic coughing, previous pelvic surgery and obesity.  Symptoms  A bladder that has dropped from its normal position may cause: unwanted urine leakage (stress incontinence), frequent urination or urge to urinate, incomplete emptying of the bladder (not feeling bladder relief after emptying), pain or discomfort in the vagina, pelvis, groin, lower back or lower abdomen and frequent urinary tract infections.  Mild cases may not cause any symptoms.  Treatment Options  Pelvic floor (Kegel) exercises:  Strength training the muscles in your genital area  Behavioral changes: Treating and preventing constipation, taking time to empty your bladder properly, learning to lift properly and/or avoid heavy lifting when possible, stopping smoking, avoiding weight gain and treating a chronic cough or bronchitis.  A pessary: A vaginal support device is sometimes used to help pelvic support caused by muscle and ligament changes.  Surgery: Surgical repair may be necessary if symptoms cannot be managed with exercise, behavioral changes and a pessary.  Surgery is usually considered for severe cases.   2007, Progressive TherapeuticsAbout Rectocele  Overview  A rectocele is a type of hernia which causes  different degrees of bulging of the rectal tissues into the vaginal wall.  You may even notice that it presses against the vaginal wall so much that some vaginal tissues droop outside of the opening of your vagina.  Causes of Rectocele  The most common cause is childbirth.  The muscles and ligaments in the pelvis that hold up and support the female organs and vagina become stretched and weakened during labor and delivery.  The more babies you have, the more the support tissues are  stretched and weakened.  Not everyone who has a baby will develop a rectocele.  Some women have stronger supporting tissue in the pelvis and may not have as much of a problem as others.  Women who have a Cesarean section usually do not get rectocele's unless they pushed a long time prior to the cesarean delivery.  Other conditions that can cause a rectocele include chronic constipation, a chronic cough, a lot of heavy lifting, and obesity.  Older women may have this problem because the loss of female hormones causes the vaginal tissue to become weaker.  Symptoms  There may not be any symptoms.  If you do have symptoms, they may include:  Pelvic pressure in the rectal area  Protrusion of the lower part of the vagina through the opening of the vagina  Constipation and trapping of the stool, making it difficult to have a bowel movement.  In severe cases, you may have to press on the lower part of your vagina to help push the stool out of you rectum.  This is called splinting to empty.  Diagnosing Rectocele  Your health care provider will ask about your symptoms and perform a pelvic exam.  S/he will ask you to bear down, pushing like you are having a bowel movement so as to see how far the lower part of the vagina protrudes into the vagina and possible outside of the vagina.  Your provider will also ask you to contract the muscles of your pelvis (like you are stopping the stream in the middle of urinating) to determine the  strength of your pelvic muscles.  Your provider may also do a rectal exam.  Treatment Options  If you do not have any symptoms, no treatment may be necessary.  Other treatment options include:  Pelvic floor exercises: Contracting the muscles in your genital area may help strengthen your muscles and support the organs.  Be sure to get proper exercise instruction from you physical therapist.  A pessary (removealbe pelvic support device) sometimes helps rectocele symptoms.  Surgery: Surgical repair may be necessary. In some cases the uterus may need to be taken out ( a hysterectomy) as well.  There are many types of surgery for pelvic support problems.  Look for physicians who specialize in repair procedures.  You can take care of yourself by:  Treating and preventing constipation  Avoiding heavy lifting, and lifting correctly (with your legs, not with you waist or back)  Treating a chronic cough or bronchitis  Not smoking  avoiding too much weight gain  Doing pelvic floor exercises   2007, [employer] Doc.33

## 2019-03-12 DIAGNOSIS — Z77120 Contact with and (suspected) exposure to mold (toxic): Secondary | ICD-10-CM | POA: Diagnosis not present

## 2019-03-13 DIAGNOSIS — Z77120 Contact with and (suspected) exposure to mold (toxic): Secondary | ICD-10-CM | POA: Diagnosis not present

## 2019-04-04 ENCOUNTER — Telehealth: Payer: Self-pay | Admitting: Rheumatology

## 2019-04-04 MED ORDER — ZOLPIDEM TARTRATE 5 MG PO TABS: 5.0000 mg | ORAL_TABLET | Freq: Every evening | ORAL | 0 refills | Status: DC | PRN

## 2019-04-04 NOTE — Telephone Encounter (Signed)
Last Visit: 12/12/2018 Next Visit: 06/12/2019  Last fill: 01/30/2019 (60 tablets).    attempted to contact patient and left message on machine advising patient we had not received a refill request but I have sent it to the provider for approval.   Okay to refill ambien?

## 2019-04-04 NOTE — Telephone Encounter (Signed)
Patient needs refill on Zolpidem sent to [facility] on [address]. Per patient, request has been sent twice from pharmacy. Patient states this happens every time she needs a refill. Please call to advise.

## 2019-04-12 DIAGNOSIS — Z1231 Encounter for screening mammogram for malignant neoplasm of breast: Secondary | ICD-10-CM | POA: Diagnosis not present

## 2019-05-15 ENCOUNTER — Other Ambulatory Visit: Payer: Self-pay | Admitting: Allergy and Immunology

## 2019-05-15 ENCOUNTER — Other Ambulatory Visit: Payer: Self-pay | Admitting: Rheumatology

## 2019-05-15 ENCOUNTER — Other Ambulatory Visit: Payer: Self-pay | Admitting: Physician Assistant

## 2019-05-16 ENCOUNTER — Other Ambulatory Visit: Payer: Self-pay | Admitting: Allergy and Immunology

## 2019-05-16 NOTE — Telephone Encounter (Signed)
Patient states she believes she has had labs done with her PCP more recently than the labs we have on file. She will call and have them faxed over.

## 2019-05-16 NOTE — Telephone Encounter (Signed)
Last Visit: 12/12/2018 Next Visit: 06/12/2019  Last fill: 07/04/2018 (90 tablets)   Okay to refill robaxin?

## 2019-05-16 NOTE — Telephone Encounter (Signed)
Ok to refill Relafen.  Please ask patient if she has upcoming lab work with PCP.  Ideally she should have labs drawn every 6 months to monitor kidney function.  Creatinine and GFR were WNL on 12/12/18

## 2019-05-16 NOTE — Telephone Encounter (Signed)
Last Visit: 12/12/2018 Next Visit: 06/12/2019  Last fill: 10/20/2017 (#60, 1 refill)   Okay to refill relafen?

## 2019-05-24 ENCOUNTER — Telehealth: Payer: Self-pay | Admitting: *Deleted

## 2019-05-24 NOTE — Telephone Encounter (Signed)
Labs results from [facility] drawn 02/26/2019 Labs reviewed by Dr. Bo Merino All labs within normal limits except the following:  Calc LDL 108

## 2019-05-30 ENCOUNTER — Other Ambulatory Visit: Payer: Self-pay | Admitting: *Deleted

## 2019-05-30 MED ORDER — ZOLPIDEM TARTRATE 5 MG PO TABS: 5.0000 mg | ORAL_TABLET | Freq: Every evening | ORAL | 0 refills | Status: DC | PRN

## 2019-05-30 NOTE — Telephone Encounter (Signed)
Refill request received via fax  Last Visit: 12/12/2018 Next Visit: 06/12/2019  Last Fill: 04/04/19  Okay to refill Ambien?

## 2019-06-01 NOTE — Progress Notes (Signed)
Virtual Visit via Telephone Note  I connected with Christina Deleon on 06/06/19 at 11:15 AM EST by telephone and verified that I am speaking with the correct person using two identifiers.  Location: Patient: Home Provider: Clinic  This service was conducted via virtual visit. The patient was located at home. I was located in my office.  Consent was obtained prior to the virtual visit and is aware of possible charges through their insurance for this visit.  The patient is an established patient.  Dr. Estanislado Pandy, MD conducted the virtual visit and Christina Sams, PA-C acted as scribe during the service.  Office staff helped with scheduling follow up visits after the service was conducted.     I discussed the limitations, risks, security and privacy concerns of performing an evaluation and management service by telephone and the availability of in person appointments. I also discussed with the patient that there may be a patient responsible charge related to this service. The patient expressed understanding and agreed to proceed.  CC: Generalized pain  History of Present Illness: Patient is a 69 year old female with past medical history of DDD, osteoarthritis, and fibromyalgia.  She continues to have generalized muscle aches and muscle tenderness due to fibromyalgia.  She takes robaxin 500 mg 1 tablet as needed for muscle spasms.  She takes Ambien 5 mg 1-2 tablets at bedtime for insomnia.  She has difficulty sleeping at night due to nocturnal pain.  She has persistent neck and lower back pain.  She continues to take Relafen 500 mg 1 tablet BID prn for pain relief.  Review of Systems  Constitutional: Positive for malaise/fatigue. Negative for fever.  Eyes: Negative for photophobia, pain, discharge and redness.  Respiratory: Negative for cough and wheezing.   Cardiovascular: Negative for chest pain and palpitations.  Gastrointestinal: Positive for constipation. Negative for blood in stool and diarrhea.   Genitourinary: Negative for dysuria and urgency.  Musculoskeletal: Positive for joint pain. Negative for back pain, myalgias and neck pain.  Skin: Negative for rash.  Neurological: Negative for dizziness and headaches.  Psychiatric/Behavioral: Negative for depression. The patient is not nervous/anxious and does not have insomnia.       Observations/Objective: Physical Exam  Constitutional: She is oriented to person, place, and time.  Neurological: She is alert and oriented to person, place, and time.  Psychiatric: Mood, memory, affect and judgment normal.   Patient reports joint stiffness all day Patient reports nocturnal pain.  Difficulty dressing/grooming: Denies Difficulty climbing stairs: Reports Difficulty getting out of chair: Reports Difficulty using hands for taps, buttons, cutlery, and/or writing: Reports   Assessment and Plan: Visit Diagnoses: DDD (degenerative disc disease), cervical: Multilevel spondylosis and facet joint arthropathy-She has chronic pain in her C-spine.  She is not having any symptoms of radiculopathy at this time. She experiences nocturnal pain in her neck and stiffness throughout the day.  She was advised to continue to perform neck exercises daily.   DDD lumbar spine -She has chronic lower back pain and stiffness. She experiences nocturnal pain.  She takes robaxin 500 mg 1 tablet prn for muscle spasms. She takes relafen 500 mg BID for pain relief.  Primary osteoarthritis of both hands -She has no hand pain or inflammation at this time.  Joint protection and muscle strengthening were discussed.   Primary osteoarthritis of both knees -She is not having any knee joint pain or inflammation at this time.  She has difficulty climbing steps and getting up from a chair.  Impingement syndrome of shoulder, left -She has intermittent left shoulder joint discomfort.   Fibromyalgia -She has ongoing generalized muscle aches and muscle tenderness due to  fibromyalgia.  She takes robaxin 500 mg 1 tablet PRN for muscle spasms. She tries to take robaxin sparingly.  She continues to have chronic neck and lower back pain.  She is currently experiencing trapezius muscle tension and tenderness.  She takes relafen 500 mg BID prn for pain relief. She has interrupted sleep at night due to nocturnal pain. Her fatigue has been stable and is secondary to insomnia. We discussed the importance of regular exercise and good sleep hygiene. She is planning on getting a treadmill so she can exercise more at home.  Other insomnia -She takes Ambien 5 mg 1-2 tablets by mouth at bedtime.  She continues to have nocturnal pain which causes interrupted sleep at night.   Other fatigue -Secondary to insomnia.  We discussed the importance of regular exercise and good sleep hygiene.   Other medical conditions are listed as follows:   History of hypertension   History of gastroesophageal reflux (GERD)   History of hyperlipidemia   History of asthma   History of migraine   History of colon polyps    Follow Up Instructions: She will follow up in 6 months.    I discussed the assessment and treatment plan with the patient. The patient was provided an opportunity to ask questions and all were answered. The patient agreed with the plan and demonstrated an understanding of the instructions.   The patient was advised to call back or seek an in-person evaluation if the symptoms worsen or if the condition fails to improve as anticipated.  I provided 20 minutes of non-face-to-face time during this encounter.   Bo Merino, MD   Scribed byLovena Le Dale,PA-C

## 2019-06-06 ENCOUNTER — Telehealth (INDEPENDENT_AMBULATORY_CARE_PROVIDER_SITE_OTHER): Payer: Medicare Other | Admitting: Rheumatology

## 2019-06-06 ENCOUNTER — Other Ambulatory Visit: Payer: Self-pay

## 2019-06-06 ENCOUNTER — Encounter: Payer: Self-pay | Admitting: Rheumatology

## 2019-06-06 DIAGNOSIS — Z860100 Personal history of colon polyps, unspecified: Secondary | ICD-10-CM

## 2019-06-06 DIAGNOSIS — M19041 Primary osteoarthritis, right hand: Secondary | ICD-10-CM | POA: Diagnosis not present

## 2019-06-06 DIAGNOSIS — M542 Cervicalgia: Secondary | ICD-10-CM | POA: Diagnosis not present

## 2019-06-06 DIAGNOSIS — M797 Fibromyalgia: Secondary | ICD-10-CM

## 2019-06-06 DIAGNOSIS — M7542 Impingement syndrome of left shoulder: Secondary | ICD-10-CM

## 2019-06-06 DIAGNOSIS — M19042 Primary osteoarthritis, left hand: Secondary | ICD-10-CM

## 2019-06-06 DIAGNOSIS — Z8679 Personal history of other diseases of the circulatory system: Secondary | ICD-10-CM

## 2019-06-06 DIAGNOSIS — Z8709 Personal history of other diseases of the respiratory system: Secondary | ICD-10-CM

## 2019-06-06 DIAGNOSIS — M170 Bilateral primary osteoarthritis of knee: Secondary | ICD-10-CM

## 2019-06-06 DIAGNOSIS — Z8719 Personal history of other diseases of the digestive system: Secondary | ICD-10-CM

## 2019-06-06 DIAGNOSIS — M47816 Spondylosis without myelopathy or radiculopathy, lumbar region: Secondary | ICD-10-CM

## 2019-06-06 DIAGNOSIS — G4709 Other insomnia: Secondary | ICD-10-CM

## 2019-06-06 DIAGNOSIS — R5383 Other fatigue: Secondary | ICD-10-CM

## 2019-06-06 DIAGNOSIS — Z8669 Personal history of other diseases of the nervous system and sense organs: Secondary | ICD-10-CM

## 2019-06-06 DIAGNOSIS — M5030 Other cervical disc degeneration, unspecified cervical region: Secondary | ICD-10-CM | POA: Diagnosis not present

## 2019-06-06 DIAGNOSIS — Z8639 Personal history of other endocrine, nutritional and metabolic disease: Secondary | ICD-10-CM

## 2019-06-12 ENCOUNTER — Ambulatory Visit: Payer: Medicare Other | Admitting: Rheumatology

## 2019-06-23 ENCOUNTER — Ambulatory Visit: Payer: Medicare Other | Attending: Internal Medicine

## 2019-06-23 DIAGNOSIS — Z23 Encounter for immunization: Secondary | ICD-10-CM | POA: Insufficient documentation

## 2019-06-23 NOTE — Progress Notes (Signed)
   Covid-19 Vaccination Clinic  Name:  Christina Deleon    MRN: [id number] DOB: 1951-04-12  06/23/2019  [name] was observed post Covid-19 immunization for 15 minutes without incidence. She was provided with Vaccine Information Sheet and instruction to access the V-Safe system.   [name] was instructed to call 911 with any severe reactions post vaccine: Marland Kitchen Difficulty breathing  . Swelling of your face and throat  . A fast heartbeat  . A bad rash all over your body  . Dizziness and weakness    Immunizations Administered    Name Date Dose VIS Date Route   Pfizer COVID-19 Vaccine 06/23/2019  8:19 AM 0.3 mL 04/13/2019 Intramuscular   Manufacturer: [place]   Lot: [id number]   [place]: [id number]

## 2019-07-18 ENCOUNTER — Ambulatory Visit: Payer: Medicare Other | Attending: Internal Medicine

## 2019-07-18 DIAGNOSIS — Z23 Encounter for immunization: Secondary | ICD-10-CM

## 2019-07-18 NOTE — Progress Notes (Signed)
   Covid-19 Vaccination Clinic  Name:  Christina Deleon    MRN: [id number] DOB: 1951/01/15  07/18/2019  Christina Deleon was observed post Covid-19 immunization for 15 minutes without incident. She was provided with Vaccine Information Sheet and instruction to access the V-Safe system.   Christina Deleon was instructed to call 911 with any severe reactions post vaccine: Marland Kitchen Difficulty breathing  . Swelling of face and throat  . A fast heartbeat  . A bad rash all over body  . Dizziness and weakness   Immunizations Administered    Name Date Dose VIS Date Route   Pfizer COVID-19 Vaccine 07/18/2019  8:23 AM 0.3 mL 04/13/2019 Intramuscular   Manufacturer: [place]   Lot: [id number]   [place]: [id number]

## 2019-07-25 ENCOUNTER — Other Ambulatory Visit: Payer: Self-pay | Admitting: *Deleted

## 2019-07-25 MED ORDER — ZOLPIDEM TARTRATE 5 MG PO TABS: 5.0000 mg | ORAL_TABLET | Freq: Every evening | ORAL | 0 refills | Status: DC | PRN

## 2019-07-25 NOTE — Telephone Encounter (Signed)
Last Visit: 06/06/19 Next Visit: 12/05/19  Last Fill: 05/30/19  Okay to refill Ambien?

## 2019-07-25 NOTE — Telephone Encounter (Signed)
I spoke with patient.  She states she has been taking Ambien 5 mg tablets 1 at bedtime only she was getting 60 tablets so it can last for 3 months.  Please change prescription to 1 tablet p.o. nightly total 30 tablets with 0 refills.

## 2019-09-04 ENCOUNTER — Other Ambulatory Visit: Payer: Self-pay

## 2019-09-04 MED ORDER — ZOLPIDEM TARTRATE 5 MG PO TABS: 5.0000 mg | ORAL_TABLET | Freq: Every evening | ORAL | 0 refills | Status: DC | PRN

## 2019-09-04 NOTE — Telephone Encounter (Signed)
Refill request received via fax from [facility] on [address] for Christina Deleon.   Last Visit: 06/06/2019 telemedicine  Next Visit: 12/05/2019  Last fill: 07/25/2019   Okay to refill Lorrin Mais?

## 2019-09-05 ENCOUNTER — Ambulatory Visit (INDEPENDENT_AMBULATORY_CARE_PROVIDER_SITE_OTHER): Payer: Medicare Other | Admitting: Allergy and Immunology

## 2019-09-05 ENCOUNTER — Other Ambulatory Visit: Payer: Self-pay

## 2019-09-05 ENCOUNTER — Encounter: Payer: Self-pay | Admitting: Allergy and Immunology

## 2019-09-05 VITALS — BP 142/74 | HR 88 | Temp 99.2°F | Resp 16 | Ht 63.4 in | Wt 153.0 lb

## 2019-09-05 DIAGNOSIS — J3089 Other allergic rhinitis: Secondary | ICD-10-CM | POA: Diagnosis not present

## 2019-09-05 DIAGNOSIS — K219 Gastro-esophageal reflux disease without esophagitis: Secondary | ICD-10-CM

## 2019-09-05 DIAGNOSIS — J4540 Moderate persistent asthma, uncomplicated: Secondary | ICD-10-CM

## 2019-09-05 DIAGNOSIS — G4720 Circadian rhythm sleep disorder, unspecified type: Secondary | ICD-10-CM

## 2019-09-05 DIAGNOSIS — G43909 Migraine, unspecified, not intractable, without status migrainosus: Secondary | ICD-10-CM

## 2019-09-05 MED ORDER — AIRDUO DIGIHALER 113-14 MCG/ACT IN AEPB: INHALATION_SPRAY | RESPIRATORY_TRACT | 5 refills | Status: DC

## 2019-09-05 MED ORDER — NYSTATIN 100000 UNIT/ML MT SUSP: OROMUCOSAL | 5 refills | Status: DC

## 2019-09-05 MED ORDER — CYPROHEPTADINE HCL 4 MG PO TABS: ORAL_TABLET | ORAL | 5 refills | Status: DC

## 2019-09-05 NOTE — Progress Notes (Signed)
[place]   Follow-up Note  Referring Provider: Wenda Low, MD Primary Provider: Wenda Low, MD Date of Office Visit: 09/05/2019  Subjective:   Christina Deleon (DOB: 1951-04-30) is a 69 y.o. female who returns to the [facility] [place] on 09/05/2019 in re-evaluation of the following:  HPI: Eftihia returns to this clinic in evaluation of asthma and allergic rhinitis and LPR.  Her last interaction with this clinic was via a E-med visit on 12/21/2018.  Overall her lower airway issue has been doing very well.  She has not required a systemic steroid to treat an exacerbation and she rarely uses a short acting bronchodilator while she continues to use [facility] on a consistent basis.  Her insurance will not pay for [facility] at this point in time.  Her nose has not really been causing her much of a problem.  She continues on a nasal steroid.  She complains of having a "fuzzy film" in her mouth and on her lips on a pretty regular basis.  She has also been developing a rather significant headache sometimes associated with dizziness.  She does have a history of migraine and has seen a neurologist a few years ago.  Her headaches are now daily.  Sometimes her headaches wake her up in the morning.  She does have very fractured sleep and is using Ambien but Ambien only allows her to sleep about 4 hours.  Overall her throat has been doing relatively well although she still occasionally has some throat clearing.  Intermittently her reflux is active but for the most part she appears to be doing well on her current plan.  She did obtain 2 Covid vaccinations.  Allergies as of 09/05/2019      Reactions   Trazodone Hcl Other (See Comments)   REACTION: bad headaches   Clindamycin/lincomycin Other (See Comments)   headaches   Penicillins Nausea Only, Rash   Patient has taken Amoxicillin without an issue, per patient. Has patient had a PCN reaction  causing immediate rash, facial/tongue/throat swelling, SOB or lightheadedness with hypotension: No Has patient had a PCN reaction causing severe rash involving mucus membranes or skin necrosis: No Has patient had a PCN reaction that required hospitalization No Has patient had a PCN reaction occurring within the last 10 years: No If all of the above answers are "NO", then may proceed with Cephalosporin use.      Medication List    albuterol 108 (90 Base) MCG/ACT inhaler Commonly known as: ProAir HFA Inhale 2 puffs into the lungs every 6 (six) hours as needed for wheezing or shortness of breath.   ALPRAZolam 0.5 MG tablet Commonly known as: XANAX as needed.   Azelastine-Fluticasone 137-50 MCG/ACT Susp Place 1 spray into the nose daily.   CALCIUM PO Take 1 tablet by mouth daily.   candesartan 16 MG tablet Commonly known as: ATACAND Take 8 mg by mouth at bedtime.   cetirizine 10 MG tablet Commonly known as: ZYRTEC Take 1 tablet (10 mg total) by mouth daily. One tab daily for allergies   cyproheptadine 4 MG tablet Commonly known as: PERIACTIN Take one-half tablet by mouth at bedtime.  Can increase to one tablet at bedtime if needed. Started by: Jiles Prows, MD   Ruthe Mannan 100-5 MCG/ACT Aero Generic drug: mometasone-formoterol Inhale 2 puffs into the lungs 2 (two) times daily.   famotidine 40 MG tablet Commonly known as: Pepcid Take 1 tablet (40 mg  total) by mouth at bedtime.   methocarbamol 500 MG tablet Commonly known as: ROBAXIN TAKE 1 TABLET BY MOUTH DAILY AS NEEDED FOR MUSCLE SPASMS   montelukast 10 MG tablet Commonly known as: SINGULAIR TAKE 1 TABLET(10 MG) BY MOUTH DAILY   multivitamin tablet Take 1 tablet by mouth daily. Calcium, Vitamin B-12, Vitamin A, Vitamin C   nabumetone 500 MG tablet Commonly known as: RELAFEN TAKE 1 TABLET(500 MG) BY MOUTH TWICE DAILY   pantoprazole 40 MG tablet Commonly known as: PROTONIX TAKE 1 TABLET(40 MG) BY MOUTH TWICE  DAILY   polyethylene glycol powder 17 GM/SCOOP powder Commonly known as: GLYCOLAX/MIRALAX Take 17 g by mouth daily. What changed:   when to take this  reasons to take this   pravastatin 20 MG tablet Commonly known as: PRAVACHOL Take 20 mg by mouth daily.   VITAMIN A PO Take by mouth daily.   VITAMIN B-12 PO Take 1 tablet by mouth daily.   VITAMIN C PO Take 1 tablet by mouth daily.   Vitamin D 50 MCG (2000 UT) tablet Take 2,000 Units by mouth daily.   Xiidra 5 % Soln Generic drug: Lifitegrast Apply 1 drop to eye 2 (two) times daily.   zolmitriptan 5 MG disintegrating tablet Commonly known as: ZOMIG-ZMT Take 1 tablet (5 mg total) by mouth as needed for migraine.   zolpidem 5 MG tablet Commonly known as: AMBIEN Take 1 tablet (5 mg total) by mouth at bedtime as needed.       Past Medical History:  Diagnosis Date  . Adenomatous colon polyp 10/1997, and 03/2009  . Allergic rhinitis   . Allergy   . Arthritis    osteo  . Asthma   . Chronic gastritis   . Fibromyalgia   . GERD (gastroesophageal reflux disease)   . Hemorrhoids   . Hyperlipidemia   . Hypertension   . Menorrhagia    partial hysterectomy  . Migraines   . Osteoarthritis   . PONV (postoperative nausea and vomiting)   . Sleep disturbance   . 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 CATARACT EXTRACTION Bilateral 10/16 & 11/16  . EXCISION METACARPAL MASS Right 11/16/2016   Procedure: RIGHT LITTLE FINGER CYST EXCISION;  Surgeon: Milly Jakob, MD;  Location: [place];  Service: Orthopedics;  Laterality: Right;  . TONSILLECTOMY  age 48  . tooth extract     with bone graft  . Turbinate sinus surgery  2003  . VAGINAL HYSTERECTOMY  1998   secondary to prolapse, ovaries remain    Review of systems negative except as noted in HPI / PMHx or noted below:  Review of Systems  Constitutional:  Negative.   HENT: Negative.   Eyes: Negative.   Respiratory: Negative.   Cardiovascular: Negative.   Gastrointestinal: Negative.   Genitourinary: Negative.   Musculoskeletal: Negative.   Skin: Negative.   Neurological: Negative.   Endo/Heme/Allergies: Negative.   Psychiatric/Behavioral: Negative.      Objective:   Vitals:   09/05/19 1135  BP: (!) 142/74  Pulse: 88  Resp: 16  Temp: 99.2 F (37.3 C)  SpO2: 98%   Height: 5' 3.4" (161 cm)  Weight: 153 lb (69.4 kg)   Physical Exam Constitutional:      Appearance: She is not diaphoretic.  HENT:     Head: Normocephalic.     Right Ear: Tympanic membrane, ear canal and  external ear normal.     Left Ear: Tympanic membrane, ear canal and external ear normal.     Nose: Nose normal. No mucosal edema or rhinorrhea.     Mouth/Throat:     Pharynx: Uvula midline. No oropharyngeal exudate.  Eyes:     Conjunctiva/sclera: Conjunctivae normal.  Neck:     Thyroid: No thyromegaly.     Trachea: Trachea normal. No tracheal tenderness or tracheal deviation.  Cardiovascular:     Rate and Rhythm: Normal rate and regular rhythm.     Heart sounds: Normal heart sounds, S1 normal and S2 normal. No murmur.  Pulmonary:     Effort: No respiratory distress.     Breath sounds: Normal breath sounds. No stridor. No wheezing or rales.  Lymphadenopathy:     Head:     Right side of head: No tonsillar adenopathy.     Left side of head: No tonsillar adenopathy.     Cervical: No cervical adenopathy.  Skin:    Findings: No erythema or rash.     Nails: There is no clubbing.  Neurological:     Mental Status: She is alert.     Diagnostics:    Spirometry was performed and demonstrated an FEV1 of 1.75 at 99 % of predicted.  Assessment and Plan:   1. Asthma, moderate persistent, well-controlled   2. Other allergic rhinitis   3. LPRD (laryngopharyngeal reflux disease)   4. Migraine syndrome   5. Dysfunction of sleep stage or arousal     1.  Continue Pantoprazole 40mg  to twice a day and continue famotidine 40 mg in evening   2. Continue to treat and prevent inflammation:   A. AirDuo 113 Digihaler  - 1 inhalations 2 times per day  B. Dymista -1 spray each nostril 2 times per day  C. montelukast 10mg  -1 time per day  3. Treat and prevent fungus growth:   A. Nystatin oral solution - 5 mls swish and swallow after AirDuo use  4. Treat and prevent headaches and sleep dysfunction:   A. Periactin 4 mg - 1/2 - 1 tablet at bedtime  B. Revisit with Dr. Harlow Mares. Minimize caffeine use  5. Continue ProAir HFA and Zyrtec and nasal saline as needed  6.  Return to clinic in 4 weeks or earlier if problem  I am going to have Yared utilize a combination of therapy to treat several of her issues.  She can continue on therapy directed against reflux and continue on anti-inflammatory agents for her airway as noted above.  We do need to change her Dulera to a different form of combination inhaler based on her insurance.  I will have her start nystatin after she uses her combination inhaler assuming that some of the film in her mouth may be some low-grade fungal overgrowth.  She has very significant headaches and sleep dysfunction and I will start her on Periactin at nighttime utilizing just 2 mg initially.  Whether or not she will need to continue using Ambien with the use of this medication is unknown.  She will need to work through that issue.  She also needs to revisit with her neurologist regarding her chronic daily headaches.  I will see her back in this clinic in 4 weeks or earlier if there is a problem.  Allena Katz, MD Allergy / Immunology [place]

## 2019-09-05 NOTE — Patient Instructions (Addendum)
  1. Continue Pantoprazole 40mg  to twice a day and continue famotidine 40 mg in evening   2. Continue to treat and prevent inflammation:   A. AirDuo 113 Digihaler  - 1 inhalations 2 times per day  B. Dymista -1 spray each nostril 2 times per day  C. montelukast 10mg  -1 time per day  3. Treat and prevent fungus growth:   A. Nystatin oral solution - 5 mls swish and swallow after AirDuo use  4. Treat and prevent headaches and sleep dysfunction:   A. Periactin 4 mg - 1/2 - 1 tablet at bedtime  B. Revisit with Dr. Harlow Mares. Minimize caffeine use  5. Continue ProAir HFA and Zyrtec and nasal saline as needed  6.  Return to clinic in 4 weeks or earlier if problem

## 2019-09-06 ENCOUNTER — Encounter: Payer: Self-pay | Admitting: Allergy and Immunology

## 2019-09-16 ENCOUNTER — Other Ambulatory Visit: Payer: Self-pay | Admitting: Allergy and Immunology

## 2019-10-04 ENCOUNTER — Other Ambulatory Visit: Payer: Self-pay | Admitting: *Deleted

## 2019-10-04 MED ORDER — ZOLPIDEM TARTRATE 5 MG PO TABS: 5.0000 mg | ORAL_TABLET | Freq: Every evening | ORAL | 0 refills | Status: DC | PRN

## 2019-10-04 NOTE — Telephone Encounter (Signed)
Refill request received via fax from [facility] on [address] for [place].   Last Visit: 06/06/2019 telemedicine  Next Visit: 12/05/2019  Last fill: 09/04/2019  Okay to refill Lorrin Mais?

## 2019-10-09 ENCOUNTER — Telehealth: Payer: Self-pay

## 2019-10-09 ENCOUNTER — Encounter: Payer: Self-pay | Admitting: Allergy and Immunology

## 2019-10-09 ENCOUNTER — Ambulatory Visit (INDEPENDENT_AMBULATORY_CARE_PROVIDER_SITE_OTHER): Payer: Medicare Other | Admitting: Allergy and Immunology

## 2019-10-09 ENCOUNTER — Other Ambulatory Visit: Payer: Self-pay

## 2019-10-09 VITALS — BP 124/70 | HR 87 | Temp 97.8°F | Resp 16 | Wt 152.6 lb

## 2019-10-09 DIAGNOSIS — G43909 Migraine, unspecified, not intractable, without status migrainosus: Secondary | ICD-10-CM | POA: Diagnosis not present

## 2019-10-09 DIAGNOSIS — K219 Gastro-esophageal reflux disease without esophagitis: Secondary | ICD-10-CM | POA: Diagnosis not present

## 2019-10-09 DIAGNOSIS — R442 Other hallucinations: Secondary | ICD-10-CM

## 2019-10-09 DIAGNOSIS — J4540 Moderate persistent asthma, uncomplicated: Secondary | ICD-10-CM | POA: Diagnosis not present

## 2019-10-09 DIAGNOSIS — J3089 Other allergic rhinitis: Secondary | ICD-10-CM | POA: Diagnosis not present

## 2019-10-09 DIAGNOSIS — G4720 Circadian rhythm sleep disorder, unspecified type: Secondary | ICD-10-CM

## 2019-10-09 NOTE — Progress Notes (Signed)
[place]   Follow-up Note  Referring Provider: Wenda Low, MD Primary Provider: Wenda Low, MD Date of Office Visit: 10/09/2019  Subjective:   Christina Deleon (DOB: [birth year]) is a 69 y.o. female who returns to the [facility] [place] on 10/09/2019 in re-evaluation of the following:  HPI: Christina Deleon returns to this clinic in evaluation of asthma and allergic rhinitis and LPR and migraine syndrome and sleep dysfunction.  I last saw her in this clinic on 05 Sep 2019.  When she was last seen in this clinic we addressed the issue of headaches and fractured sleep by starting her on Periactin.  She does not believe that Periactin helped her headaches but it did make her extremely dry in her airway and she discontinued this agent.  She still continues to have headaches.  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  A sinus CT scan in 2019 did not identify any chronic sinusitis.  She continues to use Dymista on a regular basis.  She also was having problems with her mouth with a fuzzy film inside her mouth and lips during her last visit that has been completely eliminated with the use of nystatin following her combination inhaler.  Her asthma is under excellent control while using her combination inhaler and she rarely uses a short acting bronchodilator.  Her reflux is intermittently active but overall much better on her current medical therapy.  Allergies as of 10/09/2019      Reactions   Trazodone Hcl Other (See Comments)   REACTION: bad headaches   Clindamycin/lincomycin Other (See Comments)   headaches   Penicillins Nausea Only, Rash   Patient has taken Amoxicillin without an issue, per patient. Has patient had a PCN reaction causing immediate rash, facial/tongue/throat swelling, SOB or lightheadedness with hypotension: No Has patient  had a PCN reaction causing severe rash involving mucus membranes or skin necrosis: No Has patient had a PCN reaction that required hospitalization No Has patient had a PCN reaction occurring within the last 10 years: No If all of the above answers are "NO", then may proceed with Cephalosporin use.      Medication List      AirDuo Digihaler 113-14 MCG/ACT Aepb Generic drug: Fluticasone-Salmeterol(sensor) Inhale one puff twice daily to prevent cough or wheeze.  Rinse, gargle, and spit after use.   albuterol 108 (90 Base) MCG/ACT inhaler Commonly known as: ProAir HFA Inhale 2 puffs into the lungs every 6 (six) hours as needed for wheezing or shortness of breath.   ALPRAZolam 0.5 MG tablet Commonly known as: XANAX as needed.   Azelastine-Fluticasone 137-50 MCG/ACT Susp Place 1 spray into the nose daily.   CALCIUM PO Take 1 tablet by mouth daily.   candesartan 16 MG tablet Commonly known as: ATACAND Take 8 mg by mouth at bedtime.   cetirizine 10 MG tablet Commonly known as: ZYRTEC Take 1 tablet (10 mg total) by mouth daily. One tab daily for allergies   cyproheptadine 4 MG tablet Commonly known as: PERIACTIN Take one-half tablet by mouth at bedtime.  Can increase to one tablet at bedtime if needed.   Dulera 100-5 MCG/ACT Aero Generic drug: mometasone-formoterol Inhale 2 puffs into the lungs 2 (two) times daily.   famotidine 40 MG tablet Commonly known as: Pepcid Take 1 tablet (40 mg total) by mouth at bedtime.   methocarbamol  500 MG tablet Commonly known as: ROBAXIN TAKE 1 TABLET BY MOUTH DAILY AS NEEDED FOR MUSCLE SPASMS   montelukast 10 MG tablet Commonly known as: SINGULAIR TAKE 1 TABLET(10 MG) BY MOUTH DAILY   multivitamin tablet Take 1 tablet by mouth daily. Calcium, Vitamin B-12, Vitamin A, Vitamin C   nabumetone 500 MG tablet Commonly known as: RELAFEN TAKE 1 TABLET(500 MG) BY MOUTH TWICE DAILY   nystatin 100000 UNIT/ML suspension Commonly known as:  MYCOSTATIN Swish and swallow with 5 ml twice daily as directed.   pantoprazole 40 MG tablet Commonly known as: PROTONIX TAKE 1 TABLET(40 MG) BY MOUTH TWICE DAILY   polyethylene glycol powder 17 GM/SCOOP powder Commonly known as: GLYCOLAX/MIRALAX Take 17 g by mouth daily.   pravastatin 20 MG tablet Commonly known as: PRAVACHOL Take 20 mg by mouth daily.   VITAMIN A PO Take by mouth daily.   VITAMIN B-12 PO Take 1 tablet by mouth daily.   VITAMIN C PO Take 1 tablet by mouth daily.   Vitamin D 50 MCG (2000 UT) tablet Take 2,000 Units by mouth daily.   Xiidra 5 % Soln Generic drug: Lifitegrast Apply 1 drop to eye 2 (two) times daily.   zolmitriptan 5 MG disintegrating tablet Commonly known as: ZOMIG-ZMT Take 1 tablet (5 mg total) by mouth as needed for migraine.   zolpidem 5 MG tablet Commonly known as: AMBIEN Take 1 tablet (5 mg total) by mouth at bedtime as needed.       Past Medical History:  Diagnosis Date  . Adenomatous colon polyp 10/1997, and 03/2009  . Allergic rhinitis   . Allergy   . Arthritis    osteo  . Asthma   . Chronic gastritis   . Fibromyalgia   . GERD (gastroesophageal reflux disease)   . Hemorrhoids   . Hyperlipidemia   . Hypertension   . Menorrhagia    partial hysterectomy  . Migraines   . Osteoarthritis   . PONV (postoperative nausea and vomiting)   . Sleep disturbance   . 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 CATARACT EXTRACTION Bilateral 10/16 & 11/16  . EXCISION METACARPAL MASS Right 11/16/2016   Procedure: RIGHT LITTLE FINGER CYST EXCISION;  Surgeon: Milly Jakob, MD;  Location: [facility];  Service: Orthopedics;  Laterality: Right;  . TONSILLECTOMY  age 45  . tooth extract     with bone graft  . Turbinate sinus surgery  2003  . VAGINAL HYSTERECTOMY  1998   secondary to prolapse, ovaries remain    Review of  systems negative except as noted in HPI / PMHx or noted below:  Review of Systems  Constitutional: Negative.   HENT: Negative.   Eyes: Negative.   Respiratory: Negative.   Cardiovascular: Negative.   Gastrointestinal: Negative.   Genitourinary: Negative.   Musculoskeletal: Negative.   Skin: Negative.   Neurological: Negative.   Endo/Heme/Allergies: Negative.   Psychiatric/Behavioral: Negative.      Objective:   Vitals:   10/09/19 0910  BP: 124/70  Pulse: 87  Resp: 16  Temp: 97.8 F (36.6 C)  SpO2: 99%      Weight: 152 lb 9.6 oz (69.2 kg)   Physical Exam Constitutional:      Appearance: She is not diaphoretic.  HENT:     Head: Normocephalic.     Right Ear: Tympanic membrane, ear canal and external ear normal.  Left Ear: Tympanic membrane, ear canal and external ear normal.     Nose: Nose normal. No mucosal edema or rhinorrhea.     Mouth/Throat:     Pharynx: Uvula midline. No oropharyngeal exudate.  Eyes:     Conjunctiva/sclera: Conjunctivae normal.  Neck:     Thyroid: No thyromegaly.     Trachea: Trachea normal. No tracheal tenderness or tracheal deviation.  Cardiovascular:     Rate and Rhythm: Normal rate and regular rhythm.     Heart sounds: Normal heart sounds, S1 normal and S2 normal. No murmur.  Pulmonary:     Effort: No respiratory distress.     Breath sounds: Normal breath sounds. No stridor. No wheezing or rales.  Lymphadenopathy:     Head:     Right side of head: No tonsillar adenopathy.     Left side of head: No tonsillar adenopathy.     Cervical: No cervical adenopathy.  Skin:    Findings: No erythema or rash.     Nails: There is no clubbing.  Neurological:     Mental Status: She is alert.     Diagnostics:    Spirometry was performed and demonstrated an FEV1 of 1.82 at 97 % of predicted.  The patient had an Asthma Control Test with the following results: ACT Total Score: 17.    Assessment and Plan:   1. Asthma, moderate  persistent, well-controlled   2. Other allergic rhinitis   3. LPRD (laryngopharyngeal reflux disease)   4. Migraine syndrome   5. Dysfunction of sleep stage or arousal   6. Phantosmia     1. Continue Pantoprazole 40mg  to twice a day and continue famotidine 40 mg in evening   2. Continue to treat and prevent inflammation:   A. AirDuo 113 Digihaler  - 1 inhalations 2 times per day  B. Dymista -1 spray each nostril 2 times per day  C. montelukast 10mg  -1 time per day  3. Treat and prevent fungus growth:   A. Nystatin oral solution - 5 mls swish and swallow after AirDuo use  4. Address headaches and sleep dysfunction:   A. Revisit with Dr. Posey Pronto  5. Address phantosmia:   A. Visit with ENT  B. Revisit with Dr. Posey Pronto  6. Continue ProAir HFA and Zyrtec and nasal saline as needed  7.  Return to clinic in 12 weeks or earlier if problem  Crystal appears to be doing pretty well from a airway standpoint on her current medical therapy which includes a collection of anti-inflammatory agents directed at her airway and aggressive therapy directed against LPR.  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  Allena Katz, MD Allergy / [place]

## 2019-10-09 NOTE — Telephone Encounter (Signed)
Patient needs referral to [facility] ENT for phantosmia.

## 2019-10-09 NOTE — Patient Instructions (Addendum)
  1. Continue Pantoprazole 40mg  to twice a day and continue famotidine 40 mg in evening   2. Continue to treat and prevent inflammation:   A. AirDuo 113 Digihaler  - 1 inhalations 2 times per day  B. Dymista -1 spray each nostril 2 times per day  C. montelukast 10mg  -1 time per day  3. Treat and prevent fungus growth:   A. Nystatin oral solution - 5 mls swish and swallow after AirDuo use  4. Address headaches and sleep dysfunction:   A. Revisit with Dr. Posey Pronto  5. Address phantosmia:   A. Visit with ENT  B. Revisit with Dr. Posey Pronto  6. Continue ProAir HFA and Zyrtec and nasal saline as needed  7.  Return to clinic in 12 weeks or earlier if problem

## 2019-10-10 ENCOUNTER — Encounter: Payer: Self-pay | Admitting: Allergy and Immunology

## 2019-10-11 NOTE — Telephone Encounter (Signed)
Referral has been placed to [facility]NT for review and scheduling. Patient has been informed of this information. Patient confirms she will give them a call if she doesn't hear anything from their office.   Thanks

## 2019-10-17 ENCOUNTER — Other Ambulatory Visit: Payer: Self-pay | Admitting: *Deleted

## 2019-10-17 MED ORDER — AZELASTINE-FLUTICASONE 137-50 MCG/ACT NA SUSP: NASAL | 5 refills | Status: DC

## 2019-10-31 ENCOUNTER — Other Ambulatory Visit: Payer: Self-pay | Admitting: *Deleted

## 2019-10-31 MED ORDER — ZOLPIDEM TARTRATE 5 MG PO TABS: 5.0000 mg | ORAL_TABLET | Freq: Every evening | ORAL | 0 refills | Status: DC | PRN

## 2019-10-31 NOTE — Telephone Encounter (Signed)
Refill request received via fax from [facility] on [address] for [place].  Last Visit:06/06/2019 telemedicine Next Visit:12/05/2019  Last fill: 10/04/2019  Okay to refillambien?

## 2019-11-09 ENCOUNTER — Telehealth: Payer: Self-pay | Admitting: Allergy and Immunology

## 2019-11-09 MED ORDER — MONTELUKAST SODIUM 10 MG PO TABS: ORAL_TABLET | ORAL | 1 refills | Status: DC

## 2019-11-09 NOTE — Telephone Encounter (Signed)
Patient was referred to [facility] ENT and cannot get in until end of August. She asked if she could be seen somewhere else, sooner. I spoke with Karena Addison and she had me call Dr. Pollie Friar office to get her in sooner. They were closed today, Friday, but I left a message for them to call me back or call the patient directly  to schedule. I called the patient and left messages on her home and cell phone about this matter. I asked her to call us back if she scheduled with them, so we could send them a referral.

## 2019-11-09 NOTE — Telephone Encounter (Signed)
Called and left a message for patient to call back to verify which [facility] she was referring to. I was not able to find the one she wants.     Montelukast was sent to pharmacy.

## 2019-11-09 NOTE — Telephone Encounter (Signed)
Patient has a prescription for Airduo Digihaler, sitting at [facility], but wants it changed [facility] [address] in [place], [place]. [facility]. She also wants her Singulair refilled for a 90 day supply, but she wants it to still go to the [facility] that is on file.

## 2019-11-12 NOTE — Telephone Encounter (Signed)
Patient informed me that they no longer needed it sent to a different pharmacy. She picked the prescription up yesterday.

## 2019-11-12 NOTE — Progress Notes (Signed)
Cardiology Office Note:    Date:  11/14/2019   ID:  [name], DOB 10-Dec-1950, MRN [id number]  PCP:  Wenda Low, MD  Cardiologist:  Sinclair Grooms, MD   Referring MD: Wenda Low, MD   Chief Complaint  Patient presents with  . Irregular Heart Beat  . Loss of Consciousness  . Shortness of Breath    History of Present Illness:    Christina Deleon is a 69 y.o. female with a hx of hypertension, hyperlipidemia., asthma, and recent palpitations associated with dyspnea.  She also has intermittent dizziness.  Referring physician Dr. Benita Stabile.  She has concern about palpitations.  She awakens with a heart pounding each morning.  There are times when the pounding sensation can go on for minutes to an hour.  At other times she will spontaneously have an episode which she feels that she could faint.  She has never had a true fainting episode.  The fainting episodes are not necessarily associated with palpitations.  She has also noticed some dyspnea on exertion and a sensation of shortness of breath that can occur with or without palpitations.  She denies orthopnea and lower extremity swelling.  There is a significant family history of atherosclerosis with her mom who is now 9+ years of age having prior coronary bypass, father died of an MI, and her older brother has history of stents.  She does not smoke, drink, and is relatively active but does not have an organized exercise program.  Past Medical History:  Diagnosis Date  . Adenomatous colon polyp 10/1997, and 03/2009  . Allergic rhinitis   . Allergy   . Arthritis    osteo  . Asthma   . Chronic gastritis   . Fibromyalgia   . GERD (gastroesophageal reflux disease)   . Hemorrhoids   . Hyperlipidemia   . Hypertension   . Menorrhagia    partial hysterectomy  . Migraines   . Osteoarthritis   . PONV (postoperative nausea and vomiting)   . Sleep disturbance   . STD (sexually transmitted disease) 01/22/11 culture proven    HSV type I labia    Past Surgical History:  Procedure Laterality Date  . CATARACT EXTRACTION Bilateral 10/16 & 11/16  . EXCISION METACARPAL MASS Right 11/16/2016   Procedure: RIGHT LITTLE FINGER CYST EXCISION;  Surgeon: Milly Jakob, MD;  Location: [facility];  Service: Orthopedics;  Laterality: Right;  . TONSILLECTOMY  age 16  . tooth extract     with bone graft  . Turbinate sinus surgery  2003  . VAGINAL HYSTERECTOMY  1998   secondary to prolapse, ovaries remain    Current Medications: Current Meds  Medication Sig  . albuterol (PROAIR HFA) 108 (90 Base) MCG/ACT inhaler Inhale 2 puffs into the lungs every 6 (six) hours as needed for wheezing or shortness of breath.  . ALPRAZolam (XANAX) 0.5 MG tablet as needed.  . Ascorbic Acid (VITAMIN C PO) Take 1 tablet by mouth daily.  . Azelastine-Fluticasone 137-50 MCG/ACT SUSP Use one spray in each nostril twice daily  . CALCIUM PO Take 1 tablet by mouth daily.  . candesartan (ATACAND) 16 MG tablet Take 8 mg by mouth at bedtime.   . cetirizine (ZYRTEC) 10 MG tablet Take 1 tablet (10 mg total) by mouth daily. One tab daily for allergies  . Cholecalciferol (VITAMIN D) 2000 units tablet Take 2,000 Units by mouth daily.  . Cyanocobalamin (VITAMIN B-12 PO) Take 1 tablet by mouth daily.  Marland Kitchen  cyproheptadine (PERIACTIN) 4 MG tablet Take one-half tablet by mouth at bedtime.  Can increase to one tablet at bedtime if needed.  . famotidine (PEPCID) 40 MG tablet Take 1 tablet (40 mg total) by mouth at bedtime.  . Fluticasone-Salmeterol,sensor, (AIRDUO DIGIHALER) 113-14 MCG/ACT AEPB Inhale one puff twice daily to prevent cough or wheeze.  Rinse, gargle, and spit after use.  Marland Kitchen Lifitegrast (XIIDRA) 5 % SOLN Apply 1 drop to eye 2 (two) times daily.  . methocarbamol (ROBAXIN) 500 MG tablet TAKE 1 TABLET BY MOUTH DAILY AS NEEDED FOR MUSCLE SPASMS  . montelukast (SINGULAIR) 10 MG tablet TAKE 1 TABLET(10 MG) BY MOUTH DAILY  . Multiple Vitamin  (MULTIVITAMIN) tablet Take 1 tablet by mouth daily. Calcium, Vitamin B-12, Vitamin A, Vitamin C  . nabumetone (RELAFEN) 500 MG tablet TAKE 1 TABLET(500 MG) BY MOUTH TWICE DAILY  . nystatin (MYCOSTATIN) 100000 UNIT/ML suspension Swish and swallow with 5 ml twice daily as directed.  . pantoprazole (PROTONIX) 40 MG tablet TAKE 1 TABLET(40 MG) BY MOUTH TWICE DAILY  . polyethylene glycol powder (GLYCOLAX/MIRALAX) powder Take 17 g by mouth daily.  . pravastatin (PRAVACHOL) 20 MG tablet Take 20 mg by mouth daily.    Marland Kitchen VITAMIN A PO Take by mouth daily.  Marland Kitchen zolmitriptan (ZOMIG-ZMT) 5 MG disintegrating tablet Take 1 tablet (5 mg total) by mouth as needed for migraine.  Marland Kitchen zolpidem (AMBIEN) 5 MG tablet Take 1 tablet (5 mg total) by mouth at bedtime as needed.     Allergies:   Trazodone hcl, Clindamycin/lincomycin, and Penicillins   Social History   Socioeconomic History  . Marital status: Married    Spouse name: Not on file  . Number of children: 2  . Years of education: Not on file  . Highest education level: Not on file  Occupational History  . Occupation: Retired    Comment:  Metallurgist unemployed sincesince 6/10  Tobacco Use  . Smoking status: Never Smoker  . Smokeless tobacco: Never Used  . Tobacco comment: Father growing up  Vaping Use  . Vaping Use: Never used  Substance and Sexual Activity  . Alcohol use: No    Alcohol/week: 0.0 standard drinks  . Drug use: Never  . Sexual activity: Yes    Partners: Male    Birth control/protection: Surgical    Comment: hysterectomy  Other Topics Concern  . Not on file  Social History Narrative   She lives with husband.  They have 2 grown children.   She is retired Product/process development scientist.   Highest level of education:  2 years of college   Right handed   Regular exercise, diet of fruits, veggies, limited fried foods, limited water      [facility]:   Originally from [place]. Has also lived in [place].  Previously worked doing Software engineer. No pets currently. No bird, mold, or hot tub exposure. Does have a musty smell in their home but it is new. Enjoys reading. 1 indoor plant. Has carpet in her home including in the bedroom. Also has draperies.    Social Determinants of Health   Financial Resource Strain:   . Difficulty of Paying Living Expenses:   Food Insecurity:   . Worried About Charity fundraiser in the Last Year:   . Arboriculturist in the Last Year:   Transportation Needs:   . Film/video editor (Medical):   Marland Kitchen Lack of Transportation (Non-Medical):   Physical Activity:   .  Days of Exercise per Week:   . Minutes of Exercise per Session:   Stress:   . Feeling of Stress :   Social Connections:   . Frequency of Communication with Friends and Family:   . Frequency of Social Gatherings with Friends and Family:   . Attends Religious Services:   . Active Member of Clubs or Organizations:   . Attends Archivist Meetings:   Marland Kitchen Marital Status:      Family History: The patient's family history includes Coronary artery disease in her father, mother, and paternal grandmother; Diabetes in her brother, paternal aunt, and paternal grandmother; Heart attack (age of onset: 65) in her father; Heart disease in her brother; Hyperlipidemia in her mother; Hypertension in her mother; Lung disease in her mother; Migraines in her daughter and daughter; Prostate cancer in her brother and paternal uncle; Pulmonary fibrosis in her mother; Stroke in her father and maternal grandmother; Throat cancer in her father. There is no history of Colon cancer.  ROS:   Please see the history of present illness.    History of migraines.  Mother has a history of pulmonary fibrosis.  Her husband is a patient within my practice.  She does not think she has sleep apnea.  She has been told that she snores.  She has a difficult time sleeping.  All other systems reviewed and are  negative.  EKGs/Labs/Other Studies Reviewed:    The following studies were reviewed today: No cardiac imaging  EKG:  EKG normal sinus rhythm with small nondiagnostic inferior Q waves.  Overall normal in appearance.  Recent Labs: 12/12/2018: ALT 13; BUN 13; Creat 0.82; Hemoglobin 13.7; Platelets 324; Potassium 4.3; Sodium 144  Recent Lipid Panel    Component Value Date/Time   CHOL 167 12/18/2008 1034   TRIG 84.0 12/18/2008 1034   HDL 56.70 12/18/2008 1034   CHOLHDL 3 12/18/2008 1034   VLDL 16.8 12/18/2008 1034   LDLCALC 94 12/18/2008 1034    Physical Exam:    VS:  BP 140/82   Pulse 89   Ht 5\' 4"  (1.626 m)   Wt 154 lb 3.2 oz (69.9 kg)   LMP 07/02/1994   SpO2 98%   BMI 26.47 kg/m     Wt Readings from Last 3 Encounters:  11/14/19 154 lb 3.2 oz (69.9 kg)  10/09/19 152 lb 9.6 oz (69.2 kg)  09/05/19 153 lb (69.4 kg)     GEN: Mildly overweight. No acute distress HEENT: Normal NECK: No JVD. LYMPHATICS: No lymphadenopathy CARDIAC:  RRR without murmur, gallop, or edema. VASCULAR:  Normal Pulses. No bruits. RESPIRATORY:  Clear to auscultation without rales, wheezing or rhonchi  ABDOMEN: Soft, non-tender, non-distended, No pulsatile mass, MUSCULOSKELETAL: No deformity  SKIN: Warm and dry NEUROLOGIC:  Alert and oriented x 3 PSYCHIATRIC:  Normal affect   ASSESSMENT:    1. Palpitations   2. DOE (dyspnea on exertion)   3. Dizziness   4. Essential hypertension   5. Hyperlipidemia LDL goal <70   6. Educated about COVID-19 virus infection    PLAN:    In order of problems listed above:  1. 30-day event monitor to ascertain etiology of palpitations and to exclude atrial fibrillation. 2. 2D Doppler echocardiogram to assess diastolic function and right heart size and function.  Given strong family history of CAD, will also do a coronary calcium score which may inform us about the possibility of dyspnea as an anginal equivalent. 3. Dizziness is nonspecific.  The monitoring  will  help determine if there is an arrhythmic connection. 4. Target blood pressure given risk profile is 130/80 mmHg.  She should continue Atacand perhaps increasing the dose to 16 mg/day.   5. Low-dose diuretic therapy could also be added.  Low-salt diet discussed.  Given family history and age, LDL control needs to be improved.  LDL target is likely less than 70.  We will see what the calcium score reveals and then add therapy if needed. 6. Vaccinated and practicing social distancing.  Follow-up and 6 to 8 weeks after testing to discuss findings and initiate any therapeutic interventions if needed.   Medication Adjustments/Labs and Tests Ordered: Current medicines are reviewed at length with the patient today.  Concerns regarding medicines are outlined above.  Orders Placed This Encounter  Procedures  . CT CARDIAC SCORING  . Cardiac event monitor  . EKG 12-Lead  . ECHOCARDIOGRAM COMPLETE   No orders of the defined types were placed in this encounter.   Patient Instructions  Medication Instructions:  Your physician recommends that you continue on your current medications as directed. Please refer to the Current Medication list given to you today.  *If you need a refill on your cardiac medications before your next appointment, please call your pharmacy*   Lab Work: None  If you have labs (blood work) drawn today and your tests are completely normal, you will receive your results only by: Marland Kitchen MyChart Message (if you have MyChart) OR . A paper copy in the mail If you have any lab test that is abnormal or we need to change your treatment, we will call you to review the results.   Testing/Procedures: Your physician has requested that you have an echocardiogram. Echocardiography is a painless test that uses sound waves to create images of your heart. It provides your doctor with information about the size and shape of your heart and how well your heart's chambers and valves are working.  This procedure takes approximately one hour. There are no restrictions for this procedure.  Your physician recommends that you have a Calcium Score performed.  Your physician recommends that you wear a monitor for 30 days.   Follow-Up: At [facility], you and your health needs are our priority.  As part of our continuing mission to provide you with exceptional heart care, we have created designated Provider Care Teams.  These Care Teams include your primary Cardiologist (physician) and Advanced Practice Providers (APPs -  Physician Assistants and Nurse Practitioners) who all work together to provide you with the care you need, when you need it.  We recommend signing up for the patient portal called "MyChart".  Sign up information is provided on this After Visit Summary.  MyChart is used to connect with patients for Virtual Visits (Telemedicine).  Patients are able to view lab/test results, encounter notes, upcoming appointments, etc.  Non-urgent messages can be sent to your provider as well.   To learn more about what you can do with MyChart, go to [email or web address].    Your next appointment:   6-8 week(s)  The format for your next appointment:   In Person  Provider:   You may see Sinclair Grooms, MD or one of the following Advanced Practice Providers on your designated Care Team:    Truitt Merle, NP  Cecilie Kicks, NP  Kathyrn Drown, NP    Other Instructions      Signed, Sinclair Grooms, MD  11/14/2019 12:26 PM    [facility] 

## 2019-11-14 ENCOUNTER — Ambulatory Visit (INDEPENDENT_AMBULATORY_CARE_PROVIDER_SITE_OTHER): Payer: Medicare Other | Admitting: Interventional Cardiology

## 2019-11-14 ENCOUNTER — Encounter: Payer: Self-pay | Admitting: Interventional Cardiology

## 2019-11-14 ENCOUNTER — Telehealth: Payer: Self-pay | Admitting: Radiology

## 2019-11-14 ENCOUNTER — Other Ambulatory Visit: Payer: Self-pay

## 2019-11-14 VITALS — BP 140/82 | HR 89 | Ht 64.0 in | Wt 154.2 lb

## 2019-11-14 DIAGNOSIS — I10 Essential (primary) hypertension: Secondary | ICD-10-CM

## 2019-11-14 DIAGNOSIS — R0609 Other forms of dyspnea: Secondary | ICD-10-CM

## 2019-11-14 DIAGNOSIS — R42 Dizziness and giddiness: Secondary | ICD-10-CM

## 2019-11-14 DIAGNOSIS — R002 Palpitations: Secondary | ICD-10-CM | POA: Diagnosis not present

## 2019-11-14 DIAGNOSIS — R0600 Dyspnea, unspecified: Secondary | ICD-10-CM | POA: Diagnosis not present

## 2019-11-14 DIAGNOSIS — E785 Hyperlipidemia, unspecified: Secondary | ICD-10-CM

## 2019-11-14 DIAGNOSIS — Z7189 Other specified counseling: Secondary | ICD-10-CM

## 2019-11-14 NOTE — Patient Instructions (Signed)
Medication Instructions:  Your physician recommends that you continue on your current medications as directed. Please refer to the Current Medication list given to you today.  *If you need a refill on your cardiac medications before your next appointment, please call your pharmacy*   Lab Work: None  If you have labs (blood work) drawn today and your tests are completely normal, you will receive your results only by: Marland Kitchen MyChart Message (if you have MyChart) OR . A paper copy in the mail If you have any lab test that is abnormal or we need to change your treatment, we will call you to review the results.   Testing/Procedures: Your physician has requested that you have an echocardiogram. Echocardiography is a painless test that uses sound waves to create images of your heart. It provides your doctor with information about the size and shape of your heart and how well your heart's chambers and valves are working. This procedure takes approximately one hour. There are no restrictions for this procedure.  Your physician recommends that you have a Calcium Score performed.  Your physician recommends that you wear a monitor for 30 days.   Follow-Up: At [facility], you and your health needs are our priority.  As part of our continuing mission to provide you with exceptional heart care, we have created designated Provider Care Teams.  These Care Teams include your primary Cardiologist (physician) and Advanced Practice Providers (APPs -  Physician Assistants and Nurse Practitioners) who all work together to provide you with the care you need, when you need it.  We recommend signing up for the patient portal called "MyChart".  Sign up information is provided on this After Visit Summary.  MyChart is used to connect with patients for Virtual Visits (Telemedicine).  Patients are able to view lab/test results, encounter notes, upcoming appointments, etc.  Non-urgent messages can be sent to your provider  as well.   To learn more about what you can do with MyChart, go to [email or web address].    Your next appointment:   6-8 week(s)  The format for your next appointment:   In Person  Provider:   You may see Sinclair Grooms, MD or one of the following Advanced Practice Providers on your designated Care Team:    Truitt Merle, NP  Cecilie Kicks, NP  Kathyrn Drown, NP    Other Instructions

## 2019-11-14 NOTE — Telephone Encounter (Signed)
Enrolled patient for a 30 day [employer] Event monitor to be mailed to patients home.  

## 2019-11-15 NOTE — Telephone Encounter (Signed)
Patient is scheduled to see Dr Constance Holster on 12/18/19 @ 11. Patient informed.

## 2019-11-21 NOTE — Progress Notes (Signed)
Office Visit Note  Patient: Christina Deleon             Date of Birth: 1951/02/11           MRN: [id number]             PCP: Wenda Low, MD Referring: Wenda Low, MD Visit Date: 12/05/2019 Occupation: @GUAROCC @  Subjective:  Left hip pain.   History of Present Illness: Christina Deleon is a 69 y.o. female with history of fibromyalgia, osteoarthritis.  She states she has been having pain and discomfort in the left trochanteric area.  She is having difficulty sleeping on that side.  She also has difficulty walking.  She requests getting cortisone injection.  She continues to have a lot of discomfort in her neck and lower back.  Her hands have been painful as well.  She takes nabumetone and methocarbamol on as needed basis.  She has been taking Ambien for insomnia.  She is unable to sleep without Ambien.  She continues to have generalized pain from fibromyalgia.  She states she had a COVID-19 vaccination in March and since then her fibromyalgia has flared.  Activities of Daily Living:  Patient reports morning stiffness for  1 hour.   Patient Reports nocturnal pain.  Difficulty dressing/grooming: Denies Difficulty climbing stairs: Reports Difficulty getting out of chair: Reports Difficulty using hands for taps, buttons, cutlery, and/or writing: Reports  Review of Systems  Constitutional: Positive for fatigue.  HENT: Positive for mouth dryness. Negative for mouth sores and nose dryness.   Eyes: Positive for itching and dryness.  Respiratory: Negative for shortness of breath and difficulty breathing.   Cardiovascular: Negative for chest pain and palpitations.  Gastrointestinal: Positive for constipation. Negative for blood in stool and diarrhea.  Endocrine: Negative for increased urination.  Genitourinary: Negative for difficulty urinating.  Musculoskeletal: Positive for arthralgias, joint pain, myalgias, morning stiffness and myalgias. Negative for joint swelling and muscle tenderness.   Skin: Negative for color change, rash and redness.  Allergic/Immunologic: Negative for susceptible to infections.  Neurological: Negative for numbness, headaches, memory loss and weakness.  Hematological: Negative for bruising/bleeding tendency.  Psychiatric/Behavioral: Negative for confusion.    PMFS History:  Patient Active Problem List   Diagnosis Date Noted  . Cough variant asthma vs uacs  04/13/2018  . Fibromyalgia 04/19/2016  . Other fatigue 04/19/2016  . 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 Primary osteoarthritis of both knees 04/19/2016  . Leg pain 01/22/2016  . Restless legs syndrome 01/22/2016  . Snoring 11/19/2015  . Periodic limb movement sleep disorder 11/19/2015  . GERD (gastroesophageal reflux disease) 10/08/2015  . Mild persistent asthma 04/09/2015  . LPRD (laryngopharyngeal reflux disease) 04/09/2015  . Allergic rhinoconjunctivitis 04/09/2015  . HEMATOCHEZIA 04/01/2010  . COUGH 04/01/2010  . GASTRITIS 09/30/2009  . NAUSEA 09/30/2009  . ABDOMINAL PAIN-EPIGASTRIC 09/30/2009  . DYSTHYMIC DISORDER 09/26/2009  . PALPITATIONS 06/17/2009  . Impingement syndrome of shoulder, left 06/04/2009  . CONSTIPATION 02/05/2009  . PERSONAL HX COLONIC POLYPS 02/05/2009  . ASTHMA, PERSISTENT, MILD 02/04/2009  . CHEST PAIN, ATYPICAL 02/04/2009  . COLONIC POLYPS, BENIGN 12/18/2008  . Hyperlipidemia LDL goal <70 12/18/2008  . Insomnia 12/18/2008  . MIGRAINE, COMMON 12/18/2008  . Essential hypertension 12/18/2008  . ALLERGIC RHINITIS 12/18/2008  . Primary osteoarthritis of both hands 12/18/2008  . DJD (degenerative joint disease), cervical 12/18/2008  . NECK PAIN, CHRONIC 12/18/2008  . LOW BACK PAIN, CHRONIC 12/18/2008    Past Medical History:  Diagnosis Date  .  Adenomatous colon polyp 10/1997, and 03/2009  . Allergic rhinitis   . Allergy   . Arthritis    osteo  . Asthma   . Chronic gastritis   . Fibromyalgia   . GERD (gastroesophageal reflux disease)   . Hemorrhoids   .  Hyperlipidemia   . Hypertension   . Menorrhagia    partial hysterectomy  . Migraines   . Osteoarthritis   . PONV (postoperative nausea and vomiting)   . Sleep disturbance   . STD (sexually transmitted disease) 01/22/11 culture proven   HSV type I labia    Family History  Problem Relation Age of Onset  . Throat cancer Father   . Coronary artery disease Father   . Heart attack Father 21  . Stroke Father        during procedure  . Coronary artery disease Mother   . Hypertension Mother   . Hyperlipidemia Mother   . Pulmonary fibrosis Mother   . Lung disease Mother   . Prostate cancer Brother   . Heart disease Brother   . Diabetes Brother   . Diabetes Paternal Aunt        x 2  . Diabetes Paternal Grandmother   . Coronary artery disease Paternal Grandmother   . Stroke Maternal Grandmother        or MI  . Prostate cancer Paternal Uncle   . Migraines Daughter   . Migraines Daughter   . Colon cancer Neg Hx    Past Surgical History:  Procedure Laterality Date  . CATARACT EXTRACTION Bilateral 10/16 & 11/16  . EXCISION METACARPAL MASS Right 11/16/2016   Procedure: RIGHT LITTLE FINGER CYST EXCISION;  Surgeon: Milly Jakob, MD;  Location: [facility];  Service: Orthopedics;  Laterality: Right;  . TONSILLECTOMY  age 69  . tooth extract     with bone graft  . Turbinate sinus surgery  2003  . VAGINAL HYSTERECTOMY  1998   secondary to prolapse, ovaries remain   Social History   Social History Narrative   She lives with husband.  They have 2 grown children.   She is retired Product/process development scientist.   Highest level of education:  2 years of college   Right handed   Regular exercise, diet of fruits, veggies, limited fried foods, limited water      [facility]:   Originally from [place]. Has also lived in [place]. Previously worked doing Software engineer. No pets currently. No bird, mold, or hot tub exposure. Does have a musty smell in their  home but it is new. Enjoys reading. 1 indoor plant. Has carpet in her home including in the bedroom. Also has draperies.    Immunization History  Administered Date(s) Administered  . Influenza, Seasonal, Injecte, Preservative Fre 04/09/2015  . Influenza,inj,Quad PF,6+ Mos 01/14/2016  . PFIZER SARS-COV-2 Vaccination 06/23/2019, 07/18/2019  . Pneumococcal Conjugate-13 11/25/2015  . Pneumococcal Polysaccharide-23 11/20/2013  . Tdap 11/20/2009     Objective: Vital Signs: BP 124/78 (BP Location: Left Arm, Patient Position: Sitting, Cuff Size: Normal)   Pulse 85   Resp 14   Ht 5\' 4"  (1.626 m)   Wt 154 lb (69.9 kg)   LMP 07/02/1994   BMI 26.43 kg/m    Physical Exam Vitals and nursing note reviewed.  Constitutional:      Appearance: She is well-developed.  HENT:     Head: Normocephalic and atraumatic.  Eyes:     Conjunctiva/sclera: Conjunctivae normal.  Cardiovascular:  Rate and Rhythm: Normal rate and regular rhythm.     Heart sounds: Normal heart sounds.  Pulmonary:     Effort: Pulmonary effort is normal.     Breath sounds: Normal breath sounds.  Abdominal:     General: Bowel sounds are normal.     Palpations: Abdomen is soft.  Musculoskeletal:     Cervical back: Normal range of motion.  Lymphadenopathy:     Cervical: No cervical adenopathy.  Skin:    General: Skin is warm and dry.     Capillary Refill: Capillary refill takes less than 2 seconds.  Neurological:     Mental Status: She is alert and oriented to person, place, and time.  Psychiatric:        Behavior: Behavior normal.      Musculoskeletal Exam: Patient has limited painful range of motion of her cervical spine.  She has discomfort and limited range of motion of lumbar spine.  Shoulder joints, elbow joints, wrist joints with good range of motion.  She has bilateral PIP and DIP thickening.  She had tenderness on palpation of her left trochanteric bursa.  She is crepitus in her knee joints without any warmth  swelling or effusion.  She has generalized hyperalgesia.  CDAI Exam: CDAI Score: -- Patient Global: --; Provider Global: -- Swollen: --; Tender: -- Joint Exam 12/05/2019   No joint exam has been documented for this visit   There is currently no information documented on the homunculus. Go to the Rheumatology activity and complete the homunculus joint exam.  Investigation: No additional findings.  Imaging: CT CARDIAC SCORING  Addendum Date: 12/05/2019   ADDENDUM REPORT: 12/05/2019 06:08 CLINICAL DATA:  Risk stratification - 69 year old female with family history of CAD EXAM: Coronary Calcium Score TECHNIQUE: The patient was scanned on a [employer] scanner. Axial non-contrast 3 mm slices were carried out through the heart. The data set was analyzed on a dedicated work station and scored using the [place]. FINDINGS: Non-cardiac: See separate report from [facility]. Ascending Aorta: Scattered descending aortic atherosclerosis Pericardium: Normal Coronary arteries: Normal origins. Distal left main, proximal LAD and RCA calcification. IMPRESSION: 1. Coronary calcium score of 177. This was 39 percentile for age and sex matched control. 2.  Descending aortic atherosclerosis. Candee Furbish, MD [facility] Electronically Signed   By: Candee Furbish MD   On: 12/05/2019 06:08   Result Date: 12/05/2019 EXAM: OVER-READ INTERPRETATION  CT CHEST The following report is an over-read performed by radiologist Dr. Rolm Baptise of [facility], [place] on 12/04/2019. This over-read does not include interpretation of cardiac or coronary anatomy or pathology. The coronary calcium score interpretation by the cardiologist is attached. COMPARISON:  None. FINDINGS: Vascular: Heart is normal size. Aorta normal caliber. Scattered aortic calcifications in the descending thoracic aorta. Mediastinum/Nodes: No adenopathy. Lungs/Pleura: Scarring in the right middle lobe and lingula inferiorly. No effusions. Upper Abdomen:  No acute findings. Musculoskeletal: Chest wall soft tissues are unremarkable. No acute bony abnormality. IMPRESSION: No acute extra cardiac abnormality. Scattered aortic atherosclerosis. Electronically Signed: By: Rolm Baptise M.D. On: 12/04/2019 09:13   ECHOCARDIOGRAM COMPLETE  Result Date: 12/04/2019    ECHOCARDIOGRAM REPORT   Patient Name:   MELANEE CORDIAL  Date of Exam: 12/04/2019 Medical Rec #:  [id number]     Height:       64.0 in Accession #:    [id number]    Weight:       154.0 lb Date of Birth:  [birth year]  BSA:          1.751 m Patient Age:    41 years      BP:           140/82 mmHg Patient Gender: F             HR:           75 bpm. Exam Location:  [address] Procedure: 2D Echo, 3D Echo, Cardiac Doppler, Color Doppler and Strain Analysis Indications:    R06.00 Dyspnea  History:        Patient has no prior history of Echocardiogram examinations.                 Risk Factors:Hypertension and Dyslipidemia.  Sonographer:    Cresenciano Lick RDCS Referring Phys: [place]  1. Left ventricular ejection fraction, by estimation, is 60 to 65%. The left ventricle has normal function. The left ventricle has no regional wall motion abnormalities. Left ventricular diastolic parameters are consistent with Grade I diastolic dysfunction (impaired relaxation).  2. Right ventricular systolic function is normal. The right ventricular size is normal.  3. The mitral valve is normal in structure. No evidence of mitral valve regurgitation. No evidence of mitral stenosis.  4. The aortic valve is tricuspid. Aortic valve regurgitation is not visualized. Mild aortic valve sclerosis is present, with no evidence of aortic valve stenosis.  5. The inferior vena cava is normal in size with greater than 50% respiratory variability, suggesting right atrial pressure of 3 mmHg. FINDINGS  Left Ventricle: Left ventricular ejection fraction, by estimation, is 60 to 65%. The left ventricle has normal function. The  left ventricle has no regional wall motion abnormalities. The left ventricular internal cavity size was normal in size. There is  no left ventricular hypertrophy. Left ventricular diastolic parameters are consistent with Grade I diastolic dysfunction (impaired relaxation). Right Ventricle: The right ventricular size is normal.Right ventricular systolic function is normal. Left Atrium: Left atrial size was normal in size. Right Atrium: Right atrial size was normal in size. Pericardium: There is no evidence of pericardial effusion. Mitral Valve: The mitral valve is normal in structure. Normal mobility of the mitral valve leaflets. No evidence of mitral valve regurgitation. No evidence of mitral valve stenosis. Tricuspid Valve: The tricuspid valve is normal in structure. Tricuspid valve regurgitation is trivial. No evidence of tricuspid stenosis. Aortic Valve: The aortic valve is tricuspid. Aortic valve regurgitation is not visualized. Mild aortic valve sclerosis is present, with no evidence of aortic valve stenosis. Pulmonic Valve: The pulmonic valve was normal in structure. Pulmonic valve regurgitation is trivial. No evidence of pulmonic stenosis. Aorta: The aortic root is normal in size and structure. Venous: The inferior vena cava is normal in size with greater than 50% respiratory variability, suggesting right atrial pressure of 3 mmHg. IAS/Shunts: No atrial level shunt detected by color flow Doppler.  LEFT VENTRICLE PLAX 2D LVIDd:         3.50 cm  Diastology LVIDs:         2.20 cm  LV e' lateral:   6.13 cm/s LV PW:         0.90 cm  LV E/e' lateral: 11.5 LV IVS:        0.90 cm  LV e' medial:    6.31 cm/s LVOT diam:     1.90 cm  LV E/e' medial:  11.2 LV SV:         57 LV SV Index:  43       2D Longitudinal Strain LVOT Area:     2.84 cm 2D Strain GLS (A2C):   -21.2 %                         2D Strain GLS (A3C):   -19.1 %                         2D Strain GLS (A4C):   -18.5 %                         2D Strain GLS  Avg:     -19.6 %                          3D Volume EF:                         3D EF:        57 %                         LV EDV:       86 ml                         LV ESV:       37 ml                         LV SV:        49 ml RIGHT VENTRICLE RV Basal diam:  3.30 cm RV S prime:     12.70 cm/s TAPSE (M-mode): 2.4 cm LEFT ATRIUM             Index       RIGHT ATRIUM          Index LA diam:        3.20 cm 1.83 cm/m  RA Area:     8.90 cm LA Vol (A2C):   40.1 ml 22.91 ml/m RA Volume:   17.60 ml 10.05 ml/m LA Vol (A4C):   26.1 ml 14.91 ml/m LA Biplane Vol: 35.1 ml 20.05 ml/m  AORTIC VALVE LVOT Vmax:   92.30 cm/s LVOT Vmean:  66.300 cm/s LVOT VTI:    0.200 m  AORTA Ao Root diam: 2.60 cm Ao Asc diam:  3.40 cm MITRAL VALVE MV Area (PHT): 3.72 cm    SHUNTS MV Decel Time: 204 msec    Systemic VTI:  0.20 m MV E velocity: 70.70 cm/s  Systemic Diam: 1.90 cm MV A velocity: 90.00 cm/s MV E/A ratio:  0.79 Kirk Ruths MD Electronically signed by Kirk Ruths MD Signature Date/Time: 12/04/2019/1:30:40 PM    Final     Recent Labs: Lab Results  Component Value Date   WBC 4.4 12/12/2018   HGB 13.7 12/12/2018   PLT 324 12/12/2018   NA 144 12/12/2018   K 4.3 12/12/2018   CL 107 12/12/2018   CO2 26 12/12/2018   GLUCOSE 96 12/12/2018   BUN 13 12/12/2018   CREATININE 0.82 12/12/2018   BILITOT 0.3 12/12/2018   ALKPHOS 69 06/23/2009   AST 16 12/12/2018   ALT 13 12/12/2018   PROT 6.7 12/12/2018   ALBUMIN 3.9 06/23/2009   CALCIUM 9.8 12/12/2018   GFRAA 85 12/12/2018  Speciality Comments: No specialty comments available.  Procedures:  Large Joint Inj: L greater trochanter on 12/05/2019 10:23 AM Indications: pain Details: 27 G 1.5 in needle, lateral approach  Arthrogram: No  Medications: 40 mg triamcinolone acetonide 40 MG/ML; 1.5 mL lidocaine 1 % Aspirate: 0 mL Outcome: tolerated well, no immediate complications Procedure, treatment alternatives, risks and benefits explained, specific risks  discussed. Consent was given by the patient. Immediately prior to procedure a time out was called to verify the correct patient, procedure, equipment, support staff and site/side marked as required. Patient was prepped and draped in the usual sterile fashion.     Allergies: Trazodone hcl, Clindamycin/lincomycin, and Penicillins   Assessment / Plan:     Visit Diagnoses: Fibromyalgia-she is having a flare of fibromyalgia.  She states her fibromyalgia symptoms flared since she had COVID-19 vaccination.  She has generalized hyperalgesia and positive tender points.  She takes nabumetone and methocarbamol on as needed basis.  Other insomnia-she takes Ambien 5 mg p.o. nightly as needed.  She is unable to sleep without Ambien.  Other fatigue-due to fibromyalgia and chronic insomnia.  DDD (degenerative disc disease), cervical-she has been experiencing increasing stiffness in her cervical spine.  She had no point tenderness on examination.  She will avoid physical therapy due to the pandemic.  Have given her a handout on neck exercises.  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  Handout of back exercises were given.  Core strengthening exercises were emphasized.  Impingement syndrome of shoulder, left-she continues to have some discomfort in her left shoulder.  Primary osteoarthritis of both hands-joint protection muscle strengthening was discussed.  Trochanteric bursitis, left hip-she has tenderness on palpation over left trochanteric bursa.  Per her request left trochanteric bursa was injected as described above.  IT band stretches were discussed.  Primary osteoarthritis of both knees-she has chronic discomfort in her knee joints.  Plantar fasciitis, right-proper fitting shoes with arch support were discussed.  Other medical problems are listed as follows:  History of hypertension  History of gastroesophageal reflux (GERD)  History of hyperlipidemia  History of asthma  History  of migraine  History of colon polyps  Medication monitoring encounter - Plan: CBC with Differential/Platelet, COMPLETE METABOLIC PANEL WITH GFR   Patient is fully vaccinated against COVID-19.  Her last vaccine was in March 2021.  Orders: Orders Placed This Encounter  Procedures  . Large Joint Inj  . CBC with Differential/Platelet  . COMPLETE METABOLIC PANEL WITH GFR   No orders of the defined types were placed in this encounter.     Follow-Up Instructions: Return in about 6 months (around 06/06/2020) for Osteoarthritis,DDD, FMS.   Bo Merino, MD  Note - This record has been created using Editor, commissioning.  Chart creation errors have been sought, but may not always  have been located. Such creation errors do not reflect on  the standard of medical care.

## 2019-11-26 ENCOUNTER — Ambulatory Visit (INDEPENDENT_AMBULATORY_CARE_PROVIDER_SITE_OTHER): Payer: Medicare Other

## 2019-11-26 DIAGNOSIS — R002 Palpitations: Secondary | ICD-10-CM | POA: Diagnosis not present

## 2019-11-28 NOTE — Progress Notes (Signed)
Follow-up Visit   Date: 11/29/19   Christina Deleon MRN: [id number] DOB: 10-18-1950   Interim History: Christina Deleon is a 69 y.o. right-handed female with GERD, hypertension, hyperlipidemia, and fibromyalgia returning to the clinic for follow-up of chronic migraine and periodic limb movements.  The patient was accompanied to the clinic by self.  She was last evaluated in July 2020 for the symptoms.  She she takes Zomig 2.5 to 5 mg about 2-3 times per week, which is effective in controlling her migraines. However, starting in May, she began waking up with throbbing headache which lasts several hours.  If she takes zomig, duration is reduced to 2-3 hours.  Headaches are sporadic and canvary from several days in a row to several times per month.  She also has worsening neck pain.  She reports having increased personal stressors which is contributing to her pain.   Her limb movements are unchanged and she she remains on Robaxin which helps.  Medications:  Current Outpatient Medications on File Prior to Visit  Medication Sig Dispense Refill  . albuterol (PROAIR HFA) 108 (90 Base) MCG/ACT inhaler Inhale 2 puffs into the lungs every 6 (six) hours as needed for wheezing or shortness of breath. 18 g 1  . ALPRAZolam (XANAX) 0.5 MG tablet as needed.    . Ascorbic Acid (VITAMIN C PO) Take 1 tablet by mouth daily.    . Azelastine-Fluticasone 137-50 MCG/ACT SUSP Use one spray in each nostril twice daily 23 g 5  . CALCIUM PO Take 1 tablet by mouth daily.    . candesartan (ATACAND) 16 MG tablet Take 8 mg by mouth at bedtime.     . cetirizine (ZYRTEC) 10 MG tablet Take 1 tablet (10 mg total) by mouth daily. One tab daily for allergies 30 tablet 1  . Cholecalciferol (VITAMIN D) 2000 units tablet Take 2,000 Units by mouth daily.    . Cyanocobalamin (VITAMIN B-12 PO) Take 1 tablet by mouth daily.    . cyproheptadine (PERIACTIN) 4 MG tablet Take one-half tablet by mouth at bedtime.  Can increase to one  tablet at bedtime if needed. 30 tablet 5  . famotidine (PEPCID) 40 MG tablet Take 1 tablet (40 mg total) by mouth at bedtime. 90 tablet 1  . Fluticasone-Salmeterol,sensor, (AIRDUO DIGIHALER) 113-14 MCG/ACT AEPB Inhale one puff twice daily to prevent cough or wheeze.  Rinse, gargle, and spit after use. 1 each 5  . Lifitegrast (XIIDRA) 5 % SOLN Apply 1 drop to eye 2 (two) times daily.    . methocarbamol (ROBAXIN) 500 MG tablet TAKE 1 TABLET BY MOUTH DAILY AS NEEDED FOR MUSCLE SPASMS 90 tablet 0  . montelukast (SINGULAIR) 10 MG tablet TAKE 1 TABLET(10 MG) BY MOUTH DAILY 90 tablet 1  . Multiple Vitamin (MULTIVITAMIN) tablet Take 1 tablet by mouth daily. Calcium, Vitamin B-12, Vitamin A, Vitamin C    . nabumetone (RELAFEN) 500 MG tablet TAKE 1 TABLET(500 MG) BY MOUTH TWICE DAILY 60 tablet 1  . nystatin (MYCOSTATIN) 100000 UNIT/ML suspension Swish and swallow with 5 ml twice daily as directed. 300 mL 5  . pantoprazole (PROTONIX) 40 MG tablet TAKE 1 TABLET(40 MG) BY MOUTH TWICE DAILY 180 tablet 1  . polyethylene glycol powder (GLYCOLAX/MIRALAX) powder Take 17 g by mouth daily. 850 g 1  . pravastatin (PRAVACHOL) 20 MG tablet Take 20 mg by mouth daily.      Marland Kitchen VITAMIN A PO Take by mouth daily.    Marland Kitchen zolmitriptan (ZOMIG-ZMT) 5  MG disintegrating tablet Take 1 tablet (5 mg total) by mouth as needed for migraine. 10 tablet 11  . zolpidem (AMBIEN) 5 MG tablet Take 1 tablet (5 mg total) by mouth at bedtime as needed. 30 tablet 0   No current facility-administered medications on file prior to visit.    Allergies:  Allergies  Allergen Reactions  . Trazodone Hcl Other (See Comments)    REACTION: bad headaches  . Clindamycin/Lincomycin Other (See Comments)    headaches  . Penicillins Nausea Only and Rash    Patient has taken Amoxicillin without an issue, per patient. Has patient had a PCN reaction causing immediate rash, facial/tongue/throat swelling, SOB or lightheadedness with hypotension: No Has patient  had a PCN reaction causing severe rash involving mucus membranes or skin necrosis: No Has patient had a PCN reaction that required hospitalization No Has patient had a PCN reaction occurring within the last 10 years: No If all of the above answers are "NO", then may proceed with Cephalosporin use.     Vital Signs:  BP (!) 136/79   Pulse 87   Ht 5\' 4"  (1.626 m)   Wt 154 lb (69.9 kg)   LMP 07/02/1994   SpO2 99%   BMI 26.43 kg/m   Neurological Exam: MENTAL STATUS including orientation to time, place, person, recent and remote memory, attention span and concentration, language, and fund of knowledge is normal.  Speech is not dysarthric.  CRANIAL NERVES:   Face is symmetric.   MOTOR:  Motor strength is 5/5 in all extremities. Neck ROM is limited with side-to-side rotation.  Neck strength is 5/5.  No atrophy, fasciculations or abnormal movements.    MSRs:  Reflexes are 2+/4 throughout  COORDINATION/GAIT:   Gait narrow based and stable.   Data: n/a  IMPRESSION/PLAN: 1.  Cervicalgia, new  - Start flexeril 2.5mg  at bedtime, titrate to 5mg  at bedtime as needed.  Avoid taking with RobaXIN  - Start home neck stretching exercises  2.  episodic migraine, triggered by stress  -Continue Zomig 2.5 - 5mg  as needed  2.  Periodic limb movements of sleep  -Continue iron supplements and Robaxin as needed  Return to clinic in 4 months  Thank you for allowing me to participate in patient's care.  If I can answer any additional questions, I would be pleased to do so.    Sincerely,    Prashant Glosser K. Posey Pronto, DO

## 2019-11-29 ENCOUNTER — Encounter: Payer: Self-pay | Admitting: Neurology

## 2019-11-29 ENCOUNTER — Other Ambulatory Visit: Payer: Self-pay

## 2019-11-29 ENCOUNTER — Ambulatory Visit (INDEPENDENT_AMBULATORY_CARE_PROVIDER_SITE_OTHER): Payer: Medicare Other | Admitting: Neurology

## 2019-11-29 VITALS — BP 136/79 | HR 87 | Ht 64.0 in | Wt 154.0 lb

## 2019-11-29 DIAGNOSIS — G4761 Periodic limb movement disorder: Secondary | ICD-10-CM

## 2019-11-29 DIAGNOSIS — G43101 Migraine with aura, not intractable, with status migrainosus: Secondary | ICD-10-CM

## 2019-11-29 DIAGNOSIS — M542 Cervicalgia: Secondary | ICD-10-CM | POA: Diagnosis not present

## 2019-11-29 MED ORDER — CYCLOBENZAPRINE HCL 5 MG PO TABS: ORAL_TABLET | ORAL | 1 refills | Status: DC

## 2019-11-29 NOTE — Patient Instructions (Addendum)
Start flexeril 2.5mg  (half-tab) as needed for neck pain/headaches.  If tolerating, increase to 5mg  as needed Start home neck stretching exercises   Return to clinic in 4 months

## 2019-11-30 ENCOUNTER — Other Ambulatory Visit: Payer: Self-pay | Admitting: Physician Assistant

## 2019-11-30 NOTE — Telephone Encounter (Signed)
Christina Deleon  received alprazolam from another physician. Patent states she has been given 15 tablets for if she needs them. Patient states she only PRN basis for anxiety attacks. She cannot take Ambien with alprazolam.  Patient advised she can note take Alprazolam with the Ambien. Patient states she does not take them together as she is aware of the potential side effects.

## 2019-11-30 NOTE — Telephone Encounter (Signed)
Christina Deleon  received alprazolam from another physician.  She cannot take Ambien with alprazolam.  Please discussed with the patient.

## 2019-11-30 NOTE — Telephone Encounter (Signed)
Last Visit:06/06/2019 telemedicine Next Visit:12/05/2019  Last Fill: 10/31/2019  Okay to refill Ambien?

## 2019-12-04 ENCOUNTER — Ambulatory Visit (HOSPITAL_COMMUNITY): Payer: Medicare Other | Attending: Cardiology

## 2019-12-04 ENCOUNTER — Ambulatory Visit (INDEPENDENT_AMBULATORY_CARE_PROVIDER_SITE_OTHER)
Admission: RE | Admit: 2019-12-04 | Discharge: 2019-12-04 | Disposition: A | Discharge status: Home / Self Care | Payer: Self-pay | Source: Ambulatory Visit | Attending: Interventional Cardiology | Admitting: Interventional Cardiology

## 2019-12-04 ENCOUNTER — Other Ambulatory Visit: Payer: Self-pay

## 2019-12-04 DIAGNOSIS — R0600 Dyspnea, unspecified: Secondary | ICD-10-CM

## 2019-12-04 DIAGNOSIS — R0609 Other forms of dyspnea: Secondary | ICD-10-CM

## 2019-12-04 LAB — ECHOCARDIOGRAM COMPLETE
Area-P 1/2: 3.72 cm2
S' Lateral: 2.2 cm

## 2019-12-05 ENCOUNTER — Ambulatory Visit (INDEPENDENT_AMBULATORY_CARE_PROVIDER_SITE_OTHER): Payer: Medicare Other | Admitting: Rheumatology

## 2019-12-05 ENCOUNTER — Encounter: Payer: Self-pay | Admitting: Rheumatology

## 2019-12-05 ENCOUNTER — Telehealth: Payer: Self-pay | Admitting: *Deleted

## 2019-12-05 VITALS — BP 124/78 | HR 85 | Resp 14 | Ht 64.0 in | Wt 154.0 lb

## 2019-12-05 DIAGNOSIS — M797 Fibromyalgia: Secondary | ICD-10-CM | POA: Diagnosis not present

## 2019-12-05 DIAGNOSIS — Z8669 Personal history of other diseases of the nervous system and sense organs: Secondary | ICD-10-CM

## 2019-12-05 DIAGNOSIS — Z8719 Personal history of other diseases of the digestive system: Secondary | ICD-10-CM

## 2019-12-05 DIAGNOSIS — M47816 Spondylosis without myelopathy or radiculopathy, lumbar region: Secondary | ICD-10-CM

## 2019-12-05 DIAGNOSIS — M7062 Trochanteric bursitis, left hip: Secondary | ICD-10-CM | POA: Diagnosis not present

## 2019-12-05 DIAGNOSIS — Z8709 Personal history of other diseases of the respiratory system: Secondary | ICD-10-CM

## 2019-12-05 DIAGNOSIS — M19042 Primary osteoarthritis, left hand: Secondary | ICD-10-CM

## 2019-12-05 DIAGNOSIS — M5030 Other cervical disc degeneration, unspecified cervical region: Secondary | ICD-10-CM

## 2019-12-05 DIAGNOSIS — M722 Plantar fascial fibromatosis: Secondary | ICD-10-CM

## 2019-12-05 DIAGNOSIS — Z8679 Personal history of other diseases of the circulatory system: Secondary | ICD-10-CM

## 2019-12-05 DIAGNOSIS — R5383 Other fatigue: Secondary | ICD-10-CM

## 2019-12-05 DIAGNOSIS — Z86010 Personal history of colonic polyps: Secondary | ICD-10-CM

## 2019-12-05 DIAGNOSIS — Z5181 Encounter for therapeutic drug level monitoring: Secondary | ICD-10-CM

## 2019-12-05 DIAGNOSIS — G4709 Other insomnia: Secondary | ICD-10-CM | POA: Diagnosis not present

## 2019-12-05 DIAGNOSIS — M7542 Impingement syndrome of left shoulder: Secondary | ICD-10-CM

## 2019-12-05 DIAGNOSIS — Z8639 Personal history of other endocrine, nutritional and metabolic disease: Secondary | ICD-10-CM

## 2019-12-05 DIAGNOSIS — M19041 Primary osteoarthritis, right hand: Secondary | ICD-10-CM

## 2019-12-05 DIAGNOSIS — M170 Bilateral primary osteoarthritis of knee: Secondary | ICD-10-CM

## 2019-12-05 MED ORDER — LIDOCAINE HCL 1 % IJ SOLN
1.5000 mL | INTRAMUSCULAR | Status: AC | PRN
  Administered 2019-12-05: 1.5 mL

## 2019-12-05 MED ORDER — TRIAMCINOLONE ACETONIDE 40 MG/ML IJ SUSP
40.0000 mg | INTRAMUSCULAR | Status: AC | PRN
  Administered 2019-12-05: 40 mg via INTRA_ARTICULAR

## 2019-12-05 NOTE — Telephone Encounter (Signed)
I spoke with patient and reviewed results with her.  She has follow up appointment on September 10,2021 and requests we check with Dr Tamala Julian to see if sooner appointment is needed.

## 2019-12-05 NOTE — Patient Instructions (Signed)
Back Exercises The following exercises strengthen the muscles that help to support the trunk and back. They also help to keep the lower back flexible. Doing these exercises can help to prevent back pain or lessen existing pain.  If you have back pain or discomfort, try doing these exercises 2-3 times each day or as told by your health care provider.  As your pain improves, do them once each day, but increase the number of times that you repeat the steps for each exercise (do more repetitions).  To prevent the recurrence of back pain, continue to do these exercises once each day or as told by your health care provider. Do exercises exactly as told by your health care provider and adjust them as directed. It is normal to feel mild stretching, pulling, tightness, or discomfort as you do these exercises, but you should stop right away if you feel sudden pain or your pain gets worse. Exercises Single knee to chest Repeat these steps 3-5 times for each leg: 1. Lie on your back on a firm bed or the floor with your legs extended. 2. Bring one knee to your chest. Your other leg should stay extended and in contact with the floor. 3. Hold your knee in place by grabbing your knee or thigh with both hands and hold. 4. Pull on your knee until you feel a gentle stretch in your lower back or buttocks. 5. Hold the stretch for 10-30 seconds. 6. Slowly release and straighten your leg. Pelvic tilt Repeat these steps 5-10 times: 1. Lie on your back on a firm bed or the floor with your legs extended. 2. Bend your knees so they are pointing toward the ceiling and your feet are flat on the floor. 3. Tighten your lower abdominal muscles to press your lower back against the floor. This motion will tilt your pelvis so your tailbone points up toward the ceiling instead of pointing to your feet or the floor. 4. With gentle tension and even breathing, hold this position for 5-10 seconds. Cat-cow Repeat these steps until  your lower back becomes more flexible: 1. Get into a hands-and-knees position on a firm surface. Keep your hands under your shoulders, and keep your knees under your hips. You may place padding under your knees for comfort. 2. Let your head hang down toward your chest. Contract your abdominal muscles and point your tailbone toward the floor so your lower back becomes rounded like the back of a cat. 3. Hold this position for 5 seconds. 4. Slowly lift your head, let your abdominal muscles relax and point your tailbone up toward the ceiling so your back forms a sagging arch like the back of a cow. 5. Hold this position for 5 seconds.  Press-ups Repeat these steps 5-10 times: 1. Lie on your abdomen (face-down) on the floor. 2. Place your palms near your head, about shoulder-width apart. 3. Keeping your back as relaxed as possible and keeping your hips on the floor, slowly straighten your arms to raise the top half of your body and lift your shoulders. Do not use your back muscles to raise your upper torso. You may adjust the placement of your hands to make yourself more comfortable. 4. Hold this position for 5 seconds while you keep your back relaxed. 5. Slowly return to lying flat on the floor.  Bridges Repeat these steps 10 times: 1. Lie on your back on a firm surface. 2. Bend your knees so they are pointing toward the ceiling and   your feet are flat on the floor. Your arms should be flat at your sides, next to your body. 3. Tighten your buttocks muscles and lift your buttocks off the floor until your waist is at almost the same height as your knees. You should feel the muscles working in your buttocks and the back of your thighs. If you do not feel these muscles, slide your feet 1-2 inches farther away from your buttocks. 4. Hold this position for 3-5 seconds. 5. Slowly lower your hips to the starting position, and allow your buttocks muscles to relax completely. If this exercise is too easy, try  doing it with your arms crossed over your chest. Abdominal crunches Repeat these steps 5-10 times: 1. Lie on your back on a firm bed or the floor with your legs extended. 2. Bend your knees so they are pointing toward the ceiling and your feet are flat on the floor. 3. Cross your arms over your chest. 4. Tip your chin slightly toward your chest without bending your neck. 5. Tighten your abdominal muscles and slowly raise your trunk (torso) high enough to lift your shoulder blades a tiny bit off the floor. Avoid raising your torso higher than that because it can put too much stress on your low back and does not help to strengthen your abdominal muscles. 6. Slowly return to your starting position. Back lifts Repeat these steps 5-10 times: 1. Lie on your abdomen (face-down) with your arms at your sides, and rest your forehead on the floor. 2. Tighten the muscles in your legs and your buttocks. 3. Slowly lift your chest off the floor while you keep your hips pressed to the floor. Keep the back of your head in line with the curve in your back. Your eyes should be looking at the floor. 4. Hold this position for 3-5 seconds. 5. Slowly return to your starting position. Contact a health care provider if:  Your back pain or discomfort gets much worse when you do an exercise.  Your worsening back pain or discomfort does not lessen within 2 hours after you exercise. If you have any of these problems, stop doing these exercises right away. Do not do them again unless your health care provider says that you can. Get help right away if:  You develop sudden, severe back pain. If this happens, stop doing the exercises right away. Do not do them again unless your health care provider says that you can. This information is not intended to replace advice given to you by your health care provider. Make sure you discuss any questions you have with your health care provider. Document Revised: 08/24/2018 Document  Reviewed: 01/19/2018 Elsevier Patient Education  [place]. Cervical Strain and Sprain Rehab Ask your health care provider which exercises are safe for you. Do exercises exactly as told by your health care provider and adjust them as directed. It is normal to feel mild stretching, pulling, tightness, or discomfort as you do these exercises. Stop right away if you feel sudden pain or your pain gets worse. Do not begin these exercises until told by your health care provider. Stretching and range-of-motion exercises Cervical side bending  1. Using good posture, sit on a stable chair or stand up. 2. Without moving your shoulders, slowly tilt your left / right ear to your shoulder until you feel a stretch in the opposite side neck muscles. You should be looking straight ahead. 3. Hold for __________ seconds. 4. Repeat with the other side  of your neck. Repeat __________ times. Complete this exercise __________ times a day. Cervical rotation  1. Using good posture, sit on a stable chair or stand up. 2. Slowly turn your head to the side as if you are looking over your left / right shoulder. ? Keep your eyes level with the ground. ? Stop when you feel a stretch along the side and the back of your neck. 3. Hold for __________ seconds. 4. Repeat this by turning to your other side. Repeat __________ times. Complete this exercise __________ times a day. Thoracic extension and pectoral stretch 1. Roll a towel or a small blanket so it is about 4 inches (10 cm) in diameter. 2. Lie down on your back on a firm surface. 3. Put the towel lengthwise, under your spine in the middle of your back. It should not be under your shoulder blades. The towel should line up with your spine from your middle back to your lower back. 4. Put your hands behind your head and let your elbows fall out to your sides. 5. Hold for __________ seconds. Repeat __________ times. Complete this exercise __________ times a  day. Strengthening exercises Isometric upper cervical flexion 1. Lie on your back with a thin pillow behind your head and a small rolled-up towel under your neck. 2. Gently tuck your chin toward your chest and nod your head down to look toward your feet. Do not lift your head off the pillow. 3. Hold for __________ seconds. 4. Release the tension slowly. Relax your neck muscles completely before you repeat this exercise. Repeat __________ times. Complete this exercise __________ times a day. Isometric cervical extension  1. Stand about 6 inches (15 cm) away from a wall, with your back facing the wall. 2. Place a soft object, about 6-8 inches (15-20 cm) in diameter, between the back of your head and the wall. A soft object could be a small pillow, a ball, or a folded towel. 3. Gently tilt your head back and press into the soft object. Keep your jaw and forehead relaxed. 4. Hold for __________ seconds. 5. Release the tension slowly. Relax your neck muscles completely before you repeat this exercise. Repeat __________ times. Complete this exercise __________ times a day. Posture and body mechanics Body mechanics refers to the movements and positions of your body while you do your daily activities. Posture is part of body mechanics. Good posture and healthy body mechanics can help to relieve stress in your body's tissues and joints. Good posture means that your spine is in its natural S-curve position (your spine is neutral), your shoulders are pulled back slightly, and your head is not tipped forward. The following are general guidelines for applying improved posture and body mechanics to your everyday activities. Sitting  1. When sitting, keep your spine neutral and keep your feet flat on the floor. Use a footrest, if necessary, and keep your thighs parallel to the floor. Avoid rounding your shoulders, and avoid tilting your head forward. 2. When working at a desk or a computer, keep your desk at a  height where your hands are slightly lower than your elbows. Slide your chair under your desk so you are close enough to maintain good posture. 3. When working at a computer, place your monitor at a height where you are looking straight ahead and you do not have to tilt your head forward or downward to look at the screen. Standing   When standing, keep your spine neutral and keep your  feet about hip-width apart. Keep a slight bend in your knees. Your ears, shoulders, and hips should line up.  When you do a task in which you stand in one place for a long time, place one foot up on a stable object that is 2-4 inches (5-10 cm) high, such as a footstool. This helps keep your spine neutral. Resting When lying down and resting, avoid positions that are most painful for you. Try to support your neck in a neutral position. You can use a contour pillow or a small rolled-up towel. Your pillow should support your neck but not push on it. This information is not intended to replace advice given to you by your health care provider. Make sure you discuss any questions you have with your health care provider. Document Revised: 08/09/2018 Document Reviewed: 01/18/2018 Elsevier Patient Education  [place].

## 2019-12-05 NOTE — Telephone Encounter (Signed)
-----   Message from Belva Crome, MD sent at 12/05/2019  2:21 PM EDT ----- Regarding: RE: results Echo shows diastolic dysfunction and could explain dyspnea. Will need an OV to discuss. Not and urgent issue. ----- Message ----- From: Thompson Grayer, RN Sent: 12/05/2019   2:10 PM EDT To: Belva Crome, MD, Loren Racer, RN Subject: results                                        Dr Tamala Julian, I got your message with Calcium score CT results on this patient.  She also had an echo yesterday.  I don't see a result note on it or in Jennifer's basket.  Did the results come to you?  I was going to try and call her with all results at one time.  Fraser Din

## 2019-12-05 NOTE — Telephone Encounter (Signed)
-----   Message from Belva Crome, MD sent at 12/05/2019  1:54 PM EDT ----- Regarding: FW: The patient has mild plaque build up. New to concentrate on lowering cholesterol and May need further evaluation to make sure blockages are not present. Need to discuss at [place]. ----- Message ----- From: Interface, Rad Results In Sent: 12/04/2019   9:16 AM EDT To: Belva Crome, MD

## 2019-12-06 LAB — CBC WITH DIFFERENTIAL/PLATELET
Absolute Monocytes: 310 cells/uL (ref 200–950)
Basophils Absolute: 29 cells/uL (ref 0–200)
Basophils Relative: 0.8 %
Eosinophils Absolute: 158 cells/uL (ref 15–500)
Eosinophils Relative: 4.4 %
HCT: 39.2 % (ref 35.0–45.0)
Hemoglobin: 13.1 g/dL (ref 11.7–15.5)
Lymphs Abs: 1454 cells/uL (ref 850–3900)
MCH: 30.1 pg (ref 27.0–33.0)
MCHC: 33.4 g/dL (ref 32.0–36.0)
MCV: 90.1 fL (ref 80.0–100.0)
MPV: 9.3 fL (ref 7.5–12.5)
Monocytes Relative: 8.6 %
Neutro Abs: 1649 cells/uL (ref 1500–7800)
Neutrophils Relative %: 45.8 %
Platelets: 310 10*3/uL (ref 140–400)
RBC: 4.35 10*6/uL (ref 3.80–5.10)
RDW: 12.5 % (ref 11.0–15.0)
Total Lymphocyte: 40.4 %
WBC: 3.6 10*3/uL — ABNORMAL LOW (ref 3.8–10.8)

## 2019-12-06 LAB — COMPLETE METABOLIC PANEL WITH GFR
AG Ratio: 1.5 (calc) (ref 1.0–2.5)
ALT: 9 U/L (ref 6–29)
AST: 16 U/L (ref 10–35)
Albumin: 4 g/dL (ref 3.6–5.1)
Alkaline phosphatase (APISO): 68 U/L (ref 37–153)
BUN: 19 mg/dL (ref 7–25)
CO2: 30 mmol/L (ref 20–32)
Calcium: 9.3 mg/dL (ref 8.6–10.4)
Chloride: 107 mmol/L (ref 98–110)
Creat: 0.96 mg/dL (ref 0.50–0.99)
GFR, Est African American: 70 mL/min/{1.73_m2} (ref >=60)
GFR, Est Non African American: 60 mL/min/{1.73_m2} (ref >=60)
Globulin: 2.6 g/dL (calc) (ref 1.9–3.7)
Glucose, Bld: 87 mg/dL (ref 65–99)
Potassium: 4.1 mmol/L (ref 3.5–5.3)
Sodium: 142 mmol/L (ref 135–146)
Total Bilirubin: 0.4 mg/dL (ref 0.2–1.2)
Total Protein: 6.6 g/dL (ref 6.1–8.1)

## 2019-12-07 ENCOUNTER — Telehealth: Payer: Self-pay | Admitting: *Deleted

## 2019-12-07 DIAGNOSIS — M797 Fibromyalgia: Secondary | ICD-10-CM

## 2019-12-07 NOTE — Progress Notes (Signed)
Mild decrease in WBC noted.  Most likely normal fluctuation in the WBC.  She should have repeat CBC in 1 month.

## 2019-12-07 NOTE — Telephone Encounter (Signed)
future order placed.

## 2019-12-07 NOTE — Telephone Encounter (Signed)
-----   Message from Bo Merino, MD sent at 12/07/2019  7:37 AM EDT ----- Mild decrease in WBC noted.  Most likely normal fluctuation in the WBC.  She should have repeat CBC in 1 month.

## 2019-12-09 NOTE — Telephone Encounter (Signed)
Let her know the echo is not a severe or significant problem. Heart is a little stiff due to aging. Unfortunately, this happens to most of [place]. The calcium score is elevated and demands that we increase intensity of LDL lowering. Last was 108 in October 2020. Change Pravastatin to Rosuvastatin 20 mg dily and repeat lipid panel and liver at OV in September. Will need to await monitor give overarching remarks. So far, nothing terrible identifies.

## 2019-12-10 NOTE — Telephone Encounter (Signed)
Left message to call back  

## 2019-12-11 ENCOUNTER — Encounter: Payer: Self-pay | Admitting: *Deleted

## 2019-12-11 MED ORDER — ROSUVASTATIN CALCIUM 20 MG PO TABS
20.0000 mg | ORAL_TABLET | Freq: Every day | ORAL | 3 refills | Status: DC
Start: 2019-12-11 — End: 2020-11-11

## 2019-12-11 NOTE — Telephone Encounter (Signed)
Spoke with pt and reviewed information and recommendations from Dr. Tamala Julian.  Pt agreeable to plan.  Moved appt to morning so pt could come fasting.  Pt appreciative for call.

## 2019-12-11 NOTE — Progress Notes (Signed)
This encounter was created in error - please disregard.

## 2019-12-13 ENCOUNTER — Encounter: Payer: Self-pay | Admitting: Allergy and Immunology

## 2019-12-13 ENCOUNTER — Other Ambulatory Visit: Payer: Self-pay

## 2019-12-13 ENCOUNTER — Ambulatory Visit (INDEPENDENT_AMBULATORY_CARE_PROVIDER_SITE_OTHER): Payer: Medicare Other | Admitting: Allergy and Immunology

## 2019-12-13 VITALS — BP 132/82 | HR 80 | Resp 16

## 2019-12-13 DIAGNOSIS — J4540 Moderate persistent asthma, uncomplicated: Secondary | ICD-10-CM

## 2019-12-13 DIAGNOSIS — R442 Other hallucinations: Secondary | ICD-10-CM

## 2019-12-13 DIAGNOSIS — J3089 Other allergic rhinitis: Secondary | ICD-10-CM

## 2019-12-13 DIAGNOSIS — K219 Gastro-esophageal reflux disease without esophagitis: Secondary | ICD-10-CM

## 2019-12-13 DIAGNOSIS — G43909 Migraine, unspecified, not intractable, without status migrainosus: Secondary | ICD-10-CM

## 2019-12-13 MED ORDER — QVAR REDIHALER 40 MCG/ACT IN AERB: INHALATION_SPRAY | RESPIRATORY_TRACT | 5 refills | Status: DC

## 2019-12-13 NOTE — Patient Instructions (Addendum)
  1. Continue to treat reflux/LPR:    A. Pantoprazole 40mg  twice a day  B. Famotidine 40 mg in evening   2. Continue to treat and prevent inflammation:   A. Dymista -1 spray each nostril 2 times per day  B. montelukast 10mg  -1 time per day  C. Qvar 40 Redihaler- 2 inhalations 2 times per day (stop AirDuo)  D. Prednisone 10 mg - 1 tablet 1 time per day for 5 days only  3. Discontinue Nystatin   4. 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 Continue ProAir HFA and Zyrtec and nasal saline as needed  6.  Return to clinic in 12 weeks or earlier if problem  7. Plan for fall flu vaccine  8. Further evaluation for neck / headache issue?

## 2019-12-13 NOTE — Progress Notes (Signed)
[facility]   Follow-up Note   Referring Provider: Wenda Low, MD Primary Provider: Wenda Low, MD Date of Office Visit: 12/13/2019  Subjective:   Christina Deleon (DOB: [birth year]) is a 69 y.o. female who returns to the [facility] on 12/13/2019 in re-evaluation of the following:  HPI: Christina Deleon returns to this clinic in evaluation of asthma and allergic rhinitis and Phantosmia and LPR and migraine headache with sleep dysfunction.  I last saw her in his clinic on 09 October 2019.  She has intermittent cough.  A lot of this cough is a very short duration cough and it appears to involve her throat.  The current combination inhaler makes her cough when she utilizes this agent.  Over the course of the past week or so she has become significantly worse with her coughing and she is requesting a short course of systemic steroids which always helps with her cough.  She has not been having any problems with her nose on her current therapy.  She still continues to have these "funny smells".  We gave her nystatin to utilize on a regular basis after she used her combination inhaler assuming that the "fuzzy film" in her mouth was secondary to some back fungal overgrowth.  This did not help this issue at all.  She had rather significant headache history and sleep dysfunction for which we gave her cyproheptadine to utilize at nighttime during her last visit.  Unfortunately, she became groggy using this medicine and had to discontinue it.  Her sleep is still very fractured.  Her headaches are still very active.  She did visit with her neurologist who apparently told her that she has a neck problem.  Her reflux is still intermittently active.  She still has some irritation in her throat.  She is scheduled to see an ENT doctor on 18 December 2019.  She is wearing a heart monitor for palpitations and "flutter".  Allergies as of 12/13/2019       Reactions   Trazodone Hcl Other (See Comments)   REACTION: bad headaches   Clindamycin/lincomycin Other (See Comments)   headaches   Penicillins Nausea Only, Rash   Patient has taken Amoxicillin without an issue, per patient. Has patient had a PCN reaction causing immediate rash, facial/tongue/throat swelling, SOB or lightheadedness with hypotension: No Has patient had a PCN reaction causing severe rash involving mucus membranes or skin necrosis: No Has patient had a PCN reaction that required hospitalization No Has patient had a PCN reaction occurring within the last 10 years: No If all of the above answers are "NO", then may proceed with Cephalosporin use.      Medication List      AirDuo Digihaler 113-14 MCG/ACT Aepb Generic drug: Fluticasone-Salmeterol(sensor) Inhale one puff twice daily to prevent cough or wheeze.  Rinse, gargle, and spit after use.   albuterol 108 (90 Base) MCG/ACT inhaler Commonly known as: ProAir HFA Inhale 2 puffs into the lungs every 6 (six) hours as needed for wheezing or shortness of breath.   ALPRAZolam 0.5 MG tablet Commonly known as: XANAX as needed.   Azelastine-Fluticasone 137-50 MCG/ACT Susp Use one spray in each nostril twice daily   CALCIUM PO Take 1 tablet by mouth daily.   candesartan 16 MG tablet Commonly known as: ATACAND Take 8 mg by mouth at bedtime.   cetirizine 10 MG tablet Commonly known as: ZYRTEC Take 1 tablet (10 mg total) by mouth  daily. One tab daily for allergies   cyclobenzaprine 5 MG tablet Commonly known as: FLEXERIL Start taking 2.5mg  (half-tab) at bedtime as needed for pain, ok to increase to 5mg  (1 tablet), if tolerating.   famotidine 40 MG tablet Commonly known as: Pepcid Take 1 tablet (40 mg total) by mouth at bedtime.   methocarbamol 500 MG tablet Commonly known as: ROBAXIN TAKE 1 TABLET BY MOUTH DAILY AS NEEDED FOR MUSCLE SPASMS   montelukast 10 MG tablet Commonly known as: SINGULAIR TAKE 1  TABLET(10 MG) BY MOUTH DAILY   multivitamin tablet Take 1 tablet by mouth daily. Calcium, Vitamin B-12, Vitamin A, Vitamin C   nabumetone 500 MG tablet Commonly known as: RELAFEN TAKE 1 TABLET(500 MG) BY MOUTH TWICE DAILY   nystatin 100000 UNIT/ML suspension Commonly known as: MYCOSTATIN Swish and swallow with 5 ml twice daily as directed.   pantoprazole 40 MG tablet Commonly known as: PROTONIX TAKE 1 TABLET(40 MG) BY MOUTH TWICE DAILY   polyethylene glycol powder 17 GM/SCOOP powder Commonly known as: GLYCOLAX/MIRALAX Take 17 g by mouth daily.   rosuvastatin 20 MG tablet Commonly known as: CRESTOR Take 1 tablet (20 mg total) by mouth daily.   VITAMIN A PO Take by mouth daily.   VITAMIN B-12 PO Take 1 tablet by mouth daily.   VITAMIN C PO Take 1 tablet by mouth daily.   Vitamin D 50 MCG (2000 UT) tablet Take 2,000 Units by mouth daily.   Xiidra 5 % Soln Generic drug: Lifitegrast Apply 1 drop to eye 2 (two) times daily.   zolmitriptan 5 MG disintegrating tablet Commonly known as: ZOMIG-ZMT Take 1 tablet (5 mg total) by mouth as needed for migraine.   zolpidem 5 MG tablet Commonly known as: AMBIEN TAKE 1 TABLET(5 MG) BY MOUTH AT BEDTIME AS NEEDED       Past Medical History:  Diagnosis Date  . Adenomatous colon polyp 10/1997, and 03/2009  . Allergic rhinitis   . Allergy   . Arthritis    osteo  . Asthma   . Chronic gastritis   . Fibromyalgia   . GERD (gastroesophageal reflux disease)   . Hemorrhoids   . Hyperlipidemia   . Hypertension   . Menorrhagia    partial hysterectomy  . Migraines   . Osteoarthritis   . PONV (postoperative nausea and vomiting)   . Sleep disturbance   . 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 CATARACT EXTRACTION Bilateral 10/16 & 11/16  . EXCISION METACARPAL MASS Right 11/16/2016   Procedure: RIGHT LITTLE FINGER CYST EXCISION;   Surgeon: Milly Jakob, MD;  Location: [facility];  Service: Orthopedics;  Laterality: Right;  . TONSILLECTOMY  age 37  . tooth extract     with bone graft  . Turbinate sinus surgery  2003  . VAGINAL HYSTERECTOMY  1998   secondary to prolapse, ovaries remain    Review of systems negative except as noted in HPI / PMHx or noted below:  Review of Systems  Constitutional: Negative.   HENT: Negative.   Eyes: Negative.   Respiratory: Negative.   Cardiovascular: Negative.   Gastrointestinal: Negative.   Genitourinary: Negative.   Musculoskeletal: Negative.   Skin: Negative.   Neurological: Negative.   Endo/Heme/Allergies: Negative.   Psychiatric/Behavioral: Negative.      Objective:   Vitals:   12/13/19 1040  BP: 132/82  Pulse: 80  Resp:  16  SpO2: 98%          Physical Exam Constitutional:      Appearance: She is not diaphoretic.     Comments: Coughing and throat clearing  HENT:     Head: Normocephalic.     Right Ear: Tympanic membrane, ear canal and external ear normal.     Left Ear: Tympanic membrane, ear canal and external ear normal.     Nose: Nose normal. No mucosal edema or rhinorrhea.     Mouth/Throat:     Pharynx: Uvula midline. No oropharyngeal exudate.  Eyes:     Conjunctiva/sclera: Conjunctivae normal.  Neck:     Thyroid: No thyromegaly.     Trachea: Trachea normal. No tracheal tenderness or tracheal deviation.  Cardiovascular:     Rate and Rhythm: Normal rate and regular rhythm.     Heart sounds: Normal heart sounds, S1 normal and S2 normal. No murmur heard.   Pulmonary:     Effort: No respiratory distress.     Breath sounds: Normal breath sounds. No stridor. No wheezing or rales.  Lymphadenopathy:     Head:     Right side of head: No tonsillar adenopathy.     Left side of head: No tonsillar adenopathy.     Cervical: No cervical adenopathy.  Skin:    Findings: No erythema or rash.     Nails: There is no clubbing.    Neurological:     Mental Status: She is alert.     Diagnostics:    Spirometry was performed and demonstrated an FEV1 of 2.05 at 116 % of predicted.  Assessment and Plan:   1. Asthma, moderate persistent, well-controlled   2. Other allergic rhinitis   3. Phantosmia   4. LPRD (laryngopharyngeal reflux disease)   5. Migraine syndrome     1. Continue to treat reflux/LPR:    A. Pantoprazole 40mg  twice a day  B. Famotidine 40 mg in evening   2. Continue to treat and prevent inflammation:   A. Dymista -1 spray each nostril 2 times per day  B. montelukast 10mg  -1 time per day  C. Qvar 40 Redihaler- 2 inhalations 2 times per day (stop AirDuo)  D.  Prednisone 10 mg - 1 tablet 1 time per day for 5 days only  3. Discontinue Nystatin   4. 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 Continue ProAir HFA and Zyrtec and nasal saline as needed  6.  Return to clinic in 12 weeks or earlier if problem  7. Plan for fall flu vaccine  8. Further evaluation for neck / headache issue?   Chaise has a combination of insults to her respiratory tract including reflux and possibly eosinophilic driven inflammation and will have her continue to treat her reflux aggressively as noted above and eliminate her combination inhaler and have her just use Qvar given the fact that her combination inhaler was causing cough.  As well, I given her a very short course of low-dose systemic steroids today.  There does not appear to be any utility of using nystatin to eliminate the "film" in her mouth as this did not help her at all.  We will have her undergo further evaluation with ENT regarding her LPR and upper airway issues.  She will need to follow-up with her neurologist regarding her headache issue.  I will see her back in this clinic in 12 weeks or earlier if there is a problem.  Allena Katz,  MD [facility]

## 2019-12-17 ENCOUNTER — Encounter: Payer: Self-pay | Admitting: Allergy and Immunology

## 2019-12-18 DIAGNOSIS — R439 Unspecified disturbances of smell and taste: Secondary | ICD-10-CM | POA: Insufficient documentation

## 2019-12-30 ENCOUNTER — Encounter (HOSPITAL_COMMUNITY): Payer: Self-pay | Admitting: Emergency Medicine

## 2019-12-30 ENCOUNTER — Emergency Department (HOSPITAL_COMMUNITY)
Admission: EM | Admit: 2019-12-30 | Discharge: 2019-12-30 | Disposition: A | Discharge status: Home / Self Care | Payer: Medicare Other | Attending: Emergency Medicine | Admitting: Emergency Medicine

## 2019-12-30 ENCOUNTER — Other Ambulatory Visit: Payer: Self-pay

## 2019-12-30 ENCOUNTER — Emergency Department (HOSPITAL_COMMUNITY): Payer: Medicare Other

## 2019-12-30 DIAGNOSIS — I10 Essential (primary) hypertension: Secondary | ICD-10-CM | POA: Insufficient documentation

## 2019-12-30 DIAGNOSIS — N201 Calculus of ureter: Secondary | ICD-10-CM | POA: Diagnosis not present

## 2019-12-30 DIAGNOSIS — N23 Unspecified renal colic: Secondary | ICD-10-CM

## 2019-12-30 DIAGNOSIS — J45909 Unspecified asthma, uncomplicated: Secondary | ICD-10-CM | POA: Diagnosis not present

## 2019-12-30 DIAGNOSIS — R1031 Right lower quadrant pain: Secondary | ICD-10-CM | POA: Diagnosis present

## 2019-12-30 DIAGNOSIS — R1110 Vomiting, unspecified: Secondary | ICD-10-CM | POA: Insufficient documentation

## 2019-12-30 DIAGNOSIS — Z79899 Other long term (current) drug therapy: Secondary | ICD-10-CM | POA: Insufficient documentation

## 2019-12-30 LAB — BASIC METABOLIC PANEL
Anion gap: 10 (ref 5–15)
BUN: 16 mg/dL (ref 8–23)
CO2: 22 mmol/L (ref 22–32)
Calcium: 8.8 mg/dL — ABNORMAL LOW (ref 8.9–10.3)
Chloride: 106 mmol/L (ref 98–111)
Creatinine, Ser: 0.99 mg/dL (ref 0.44–1.00)
GFR calc Af Amer: >60 mL/min (ref >60)
GFR calc non Af Amer: 58 mL/min — ABNORMAL LOW (ref >60)
Glucose, Bld: 127 mg/dL — ABNORMAL HIGH (ref 70–99)
Potassium: 4.5 mmol/L (ref 3.5–5.1)
Sodium: 138 mmol/L (ref 135–145)

## 2019-12-30 LAB — CBC WITH DIFFERENTIAL/PLATELET
Abs Immature Granulocytes: 0.02 10*3/uL (ref 0.00–0.07)
Basophils Absolute: 0 10*3/uL (ref 0.0–0.1)
Basophils Relative: 1 %
Eosinophils Absolute: 0.1 10*3/uL (ref 0.0–0.5)
Eosinophils Relative: 1 %
HCT: 41.8 % (ref 36.0–46.0)
Hemoglobin: 13.5 g/dL (ref 12.0–15.0)
Immature Granulocytes: 0 %
Lymphocytes Relative: 16 %
Lymphs Abs: 1 10*3/uL (ref 0.7–4.0)
MCH: 29.7 pg (ref 26.0–34.0)
MCHC: 32.3 g/dL (ref 30.0–36.0)
MCV: 91.9 fL (ref 80.0–100.0)
Monocytes Absolute: 0.5 10*3/uL (ref 0.1–1.0)
Monocytes Relative: 8 %
Neutro Abs: 4.6 10*3/uL (ref 1.7–7.7)
Neutrophils Relative %: 74 %
Platelets: 241 10*3/uL (ref 150–400)
RBC: 4.55 MIL/uL (ref 3.87–5.11)
RDW: 12.5 % (ref 11.5–15.5)
WBC: 6.2 10*3/uL (ref 4.0–10.5)
nRBC: 0 % (ref 0.0–0.2)

## 2019-12-30 LAB — URINALYSIS, ROUTINE W REFLEX MICROSCOPIC
Bacteria, UA: NONE SEEN
Bilirubin Urine: NEGATIVE
Glucose, UA: NEGATIVE mg/dL
Ketones, ur: NEGATIVE mg/dL
Leukocytes,Ua: NEGATIVE
Nitrite: NEGATIVE
Protein, ur: NEGATIVE mg/dL
Specific Gravity, Urine: 1.005 (ref 1.005–1.030)
pH: 6 (ref 5.0–8.0)

## 2019-12-30 NOTE — Discharge Instructions (Addendum)
  Kidney Stone There was evidence of a kidney stone on the right side. It appears to have passed from the kidney, through the ureter, and it is currently present in the bladder. This classifies it as a passed kidney stone. Hydration: Hydration is key to helping a kidney stone pass.  Have a goal of half a liter of water every hour or two. Pain management options:  Antiinflammatory medications: Take 600 mg of ibuprofen every 6 hours or 440 mg (over the counter dose) to 500 mg (prescription dose) of naproxen every 12 hours for the next 3 days. After this time, these medications may be used as needed for pain. Take these medications with food to avoid upset stomach. Choose only one of these medications, do not take them together. Acetaminophen: Should you continue to have additional pain while taking the ibuprofen or naproxen, you may add in acetaminophen (generic for Tylenol) as needed. Your daily total maximum amount of acetaminophen from all sources should be limited to 4000mg /day for persons without liver problems, or 2000mg /day for those with liver problems. Follow-up: Follow-up with your primary care provider should symptoms fail to resolve within the next few days. Return: Return to the ED for significantly increased pain, difficulty urinating, pain with urination, fever, uncontrolled vomiting, or any other major concerns.  For prescription assistance, may try using prescription discount sites or apps, such as goodrx.com or Good Rx smart phone app.

## 2019-12-30 NOTE — ED Triage Notes (Signed)
Per [facility] pt from home for right flank that radiates to right groin x 4 hours. Urinary frequency. 20g Left AC given Fentanyl 150 mcg given in route.  160/80,

## 2019-12-30 NOTE — ED Provider Notes (Signed)
MSE was initiated and I personally evaluated the patient and placed orders (if any) at  5:35 PM on December 30, 2019.  Christina Deleon is a 69 y.o. female, with a history of HTN, hyperlipidemia, GERD, fibromyalgia, asthma, presenting to the ED with right lower back and right flank pain beginning suddenly this morning.  She states the pain was hard to describe, was waxing and waning, radiating into the right groin.  Upon my evaluation of patient, she experienced 1 episode of vomiting, however, has not continued to be nauseous.  She states she thinks her short-lived episode of nausea and vomiting was due to the fentanyl administered by EMS. While in the waiting room prior to my assessment, she states her pain has resolved.  It had persisted despite the fentanyl, however, it then suddenly resolved.  She still has some pressure in the suprapubic area, especially when she feels as though she needs to urinate.  This sensation of pressure resolves after she urinates. Denies fever/chills, diarrhea, constipation, hematochezia/melena, dysuria, chest pain, shortness of breath, lower extremity swelling/pain, or any other complaints.  Past Medical History:  Diagnosis Date  . Adenomatous colon polyp 10/1997, and 03/2009  . Allergic rhinitis   . Allergy   . Arthritis    osteo  . Asthma   . Chronic gastritis   . Fibromyalgia   . GERD (gastroesophageal reflux disease)   . Hemorrhoids   . Hyperlipidemia   . Hypertension   . Menorrhagia    partial hysterectomy  . Migraines   . Osteoarthritis   . PONV (postoperative nausea and vomiting)   . Sleep disturbance   . 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  Constitutional:      General: She is not in acute distress.    Appearance: She is well-developed. She is not diaphoretic.  HENT:     Head: Normocephalic and atraumatic.     Mouth/Throat:     Mouth: Mucous membranes  are moist.     Pharynx: Oropharynx is clear.  Eyes:     Conjunctiva/sclera: Conjunctivae normal.  Cardiovascular:     Rate and Rhythm: Normal rate and regular rhythm.     Pulses: Normal pulses.          Radial pulses are 2+ on the right side and 2+ on the left side.  Pulmonary:     Effort: Pulmonary effort is normal. No respiratory distress.  Abdominal:     Palpations: Abdomen is soft.     Tenderness: There is no abdominal tenderness. There is no right CVA tenderness, left CVA tenderness or guarding.  Musculoskeletal:     Cervical back: Neck supple.  Lymphadenopathy:     Cervical: No cervical adenopathy.  Skin:    General: Skin is warm and dry.  Neurological:     Mental Status: She is alert.  Psychiatric:        Mood and Affect: Mood and affect normal.        Speech: Speech normal.        Behavior: Behavior normal.    Abnormal Labs Reviewed  URINALYSIS, ROUTINE W REFLEX MICROSCOPIC - Abnormal; Notable for the following components:      Result Value   Color, Urine STRAW (*)    Hgb urine dipstick MODERATE (*)    All other components within normal limits  BASIC METABOLIC PANEL - Abnormal; Notable for the following components:  Glucose, Bld 127 (*)    Calcium 8.8 (*)    GFR calc non Af Amer 58 (*)    All other components within normal limits    Patient was seen in a triage room to help facilitate any further work-up.  There were no beds available in the ED. Available labs were reviewed.  Today's Vitals   12/30/19 1343 12/30/19 1348 12/30/19 1910 12/30/19 1914  BP:  (!) 153/76 123/73   Pulse:  79 70   Resp:  18 18   Temp:  98.7 F (37.1 C)    TempSrc:  Oral    SpO2:  100% 99%   PainSc: 8    0-No pain   There is no height or weight on file to calculate BMI.  The patient appears stable so that the remainder of the MSE may be completed by another provider.   Layla Maw 12/30/19 2002    Valarie Merino, MD 01/01/20 1102

## 2019-12-30 NOTE — ED Notes (Signed)
An After Visit Summary was printed and given to the patient. Discharge instructions given and no further questions at this time.  

## 2019-12-30 NOTE — ED Provider Notes (Signed)
[facility] DEPT Provider Note   CSN: [id number] Arrival date & time: 12/30/19  1335     History Chief Complaint  Patient presents with   Flank Pain   Groin Pain    Christina Deleon is a 69 y.o. female.  HPI      Christina Deleon is a 69 y.o. female, with a history of HTN, hyperlipidemia, GERD, fibromyalgia, asthma, presenting to the ED with right lower back and right flank pain beginning suddenly this morning.  She states the pain was hard to describe, was waxing and waning, radiating into the right groin.  Upon my evaluation of patient, she experienced 1 episode of vomiting, however, has not continued to be nauseous.  She states she thinks her short-lived episode of nausea and vomiting was due to the fentanyl administered by EMS. While in the waiting room prior to my assessment, she states her pain has resolved.  It had persisted despite the fentanyl, however, it then suddenly resolved.  She still has some pressure in the suprapubic area, especially when she feels as though she needs to urinate.  This sensation of pressure resolves after she urinates. Denies fever/chills, diarrhea, constipation, hematochezia/melena, dysuria, chest pain, shortness of breath, lower extremity swelling/pain, or any other complaints.  Past Medical History:  Diagnosis Date   Adenomatous colon polyp 10/1997, and 03/2009   Allergic rhinitis    Allergy    Arthritis    osteo   Asthma    Chronic gastritis    Fibromyalgia    GERD (gastroesophageal reflux disease)    Hemorrhoids    Hyperlipidemia    Hypertension    Menorrhagia    partial hysterectomy   Migraines    Osteoarthritis    PONV (postoperative nausea and vomiting)    Sleep disturbance    STD (sexually transmitted disease) 01/22/11 culture proven   HSV type I labia    Patient Active Problem List   Diagnosis Date Noted   Cough variant asthma vs uacs  04/13/2018   Fibromyalgia 04/19/2016    Other fatigue 04/19/2016   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   Primary osteoarthritis of both knees 04/19/2016   Leg pain 01/22/2016   Restless legs syndrome 01/22/2016   Snoring 11/19/2015   Periodic limb movement sleep disorder 11/19/2015   GERD (gastroesophageal reflux disease) 10/08/2015   Mild persistent asthma 04/09/2015   LPRD (laryngopharyngeal reflux disease) 04/09/2015   Allergic rhinoconjunctivitis 04/09/2015   HEMATOCHEZIA 04/01/2010   COUGH 04/01/2010   GASTRITIS 09/30/2009   NAUSEA 09/30/2009   ABDOMINAL PAIN-EPIGASTRIC 09/30/2009   DYSTHYMIC DISORDER 09/26/2009   PALPITATIONS 06/17/2009   Impingement syndrome of shoulder, left 06/04/2009   CONSTIPATION 02/05/2009   PERSONAL HX COLONIC POLYPS 02/05/2009   ASTHMA, PERSISTENT, MILD 02/04/2009   CHEST PAIN, ATYPICAL 02/04/2009   COLONIC POLYPS, BENIGN 12/18/2008   Hyperlipidemia LDL goal <70 12/18/2008   Insomnia 12/18/2008   MIGRAINE, COMMON 12/18/2008   Essential hypertension 12/18/2008   ALLERGIC RHINITIS 12/18/2008   Primary osteoarthritis of both hands 12/18/2008   DJD (degenerative joint disease), cervical 12/18/2008   NECK PAIN, CHRONIC 12/18/2008   LOW BACK PAIN, CHRONIC 12/18/2008    Past Surgical History:  Procedure Laterality Date   CATARACT EXTRACTION Bilateral 10/16 & 11/16   EXCISION METACARPAL MASS Right 11/16/2016   Procedure: RIGHT LITTLE FINGER CYST EXCISION;  Surgeon: Milly Jakob, MD;  Location: [facility];  Service: Orthopedics;  Laterality: Right;   TONSILLECTOMY  age 1   tooth  extract     with bone graft   Turbinate sinus surgery  2003   [place]   secondary to prolapse, ovaries remain     OB History    Gravida  2   Para  2   Term  2   Preterm  0   AB  0   Living  2     SAB  0   TAB  0   Ectopic  0   Multiple  0   Live Births  2           Family History  Problem Relation Age of Onset    Throat cancer Father    Coronary artery disease Father    Heart attack Father 87   Stroke Father        during procedure   Coronary artery disease Mother    Hypertension Mother    Hyperlipidemia Mother    Pulmonary fibrosis Mother    Lung disease Mother    Prostate cancer Brother    Heart disease Brother    Diabetes Brother    Diabetes Paternal Aunt        x 2   Diabetes Paternal Grandmother    Coronary artery disease Paternal Grandmother    Stroke Maternal Grandmother        or MI   Prostate cancer Paternal Uncle    Migraines Daughter    Migraines Daughter    Colon cancer Neg Hx     Social History   Tobacco Use   Smoking status: Never Smoker   Smokeless tobacco: Never Used   Tobacco comment: Father growing up  Vaping Use   Vaping Use: Never used  Substance Use Topics   Alcohol use: No    Alcohol/week: 0.0 standard drinks   Drug use: Never    Home Medications Prior to Admission medications   Medication Sig Start Date End Date Taking? Authorizing Provider  albuterol (PROAIR HFA) 108 (90 Base) MCG/ACT inhaler Inhale 2 puffs into the lungs every 6 (six) hours as needed for wheezing or shortness of breath. 11/14/18   Kozlow, Donnamarie Poag, MD  ALPRAZolam Duanne Moron) 0.5 MG tablet as needed. 03/22/18   [provider]  Ascorbic Acid (VITAMIN C PO) Take 1 tablet by mouth daily.    [provider]  Azelastine-Fluticasone 137-50 MCG/ACT SUSP Use one spray in each nostril twice daily 10/17/19   Kozlow, Donnamarie Poag, MD  beclomethasone (QVAR REDIHALER) 40 MCG/ACT inhaler Inhale two puffs twice daily to prevent cough or wheeze.  Rinse, gargle, and spit after use. 12/13/19   Kozlow, Donnamarie Poag, MD  CALCIUM PO Take 1 tablet by mouth daily.    [provider]  candesartan (ATACAND) 16 MG tablet Take 8 mg by mouth at bedtime.     [provider]  cetirizine (ZYRTEC) 10 MG tablet Take 1 tablet (10 mg total) by mouth daily. One tab daily for  allergies 08/07/13   [name], MD  Cholecalciferol (VITAMIN D) 2000 units tablet Take 2,000 Units by mouth daily.    [provider]  Cyanocobalamin (VITAMIN B-12 PO) Take 1 tablet by mouth daily.    [provider]  cyclobenzaprine (FLEXERIL) 5 MG tablet Start taking 2.5mg  (half-tab) at bedtime as needed for pain, ok to increase to 5mg  (1 tablet), if tolerating. 11/29/19   Patel, Donika K, DO  famotidine (PEPCID) 40 MG tablet Take 1 tablet (40 mg total) by mouth at bedtime. 11/14/18  Kozlow, Donnamarie Poag, MD  Fluticasone-Salmeterol,sensor, [facility]) [phone] MCG/ACT AEPB Inhale one puff twice daily to prevent cough or wheeze.  Rinse, gargle, and spit after use. 09/05/19   Kozlow, Donnamarie Poag, MD  Lifitegrast Shirley Friar) 5 % SOLN Apply 1 drop to eye 2 (two) times daily.    [provider]  methocarbamol (ROBAXIN) 500 MG tablet TAKE 1 TABLET BY MOUTH DAILY AS NEEDED FOR MUSCLE SPASMS 05/16/19   Bo Merino, MD  montelukast (SINGULAIR) 10 MG tablet TAKE 1 TABLET(10 MG) BY MOUTH DAILY 11/09/19   Kozlow, Donnamarie Poag, MD  Multiple Vitamin (MULTIVITAMIN) tablet Take 1 tablet by mouth daily. Calcium, Vitamin B-12, Vitamin A, Vitamin C    [provider]  nabumetone (RELAFEN) 500 MG tablet TAKE 1 TABLET(500 MG) BY MOUTH TWICE DAILY 05/16/19   Ofilia Neas, PA-C  nystatin (MYCOSTATIN) 100000 UNIT/ML suspension Swish and swallow with 5 ml twice daily as directed. 09/05/19   Kozlow, Donnamarie Poag, MD  pantoprazole (PROTONIX) 40 MG tablet TAKE 1 TABLET(40 MG) BY MOUTH TWICE DAILY 05/17/19   Kozlow, Donnamarie Poag, MD  polyethylene glycol powder (GLYCOLAX/MIRALAX) powder Take 17 g by mouth daily. 08/01/13   Gregor Hams, MD  rosuvastatin (CRESTOR) 20 MG tablet Take 1 tablet (20 mg total) by mouth daily. 12/11/19   Belva Crome, MD  VITAMIN A PO Take by mouth daily.    [provider]  zolmitriptan (ZOMIG-ZMT) 5 MG disintegrating tablet Take 1 tablet (5 mg total) by mouth as needed for  migraine. 11/29/18   Patel, Arvin Collard K, DO  zolpidem (AMBIEN) 5 MG tablet TAKE 1 TABLET(5 MG) BY MOUTH AT BEDTIME AS NEEDED 11/30/19   Bo Merino, MD    Allergies    Trazodone hcl, Clindamycin/lincomycin, and Penicillins  Review of Systems   Review of Systems  Constitutional: Negative for chills, diaphoresis and fever.  Respiratory: Negative for shortness of breath.   Cardiovascular: Negative for chest pain.  Gastrointestinal: Positive for nausea and vomiting. Negative for abdominal pain, blood in stool and diarrhea.  Genitourinary: Positive for flank pain. Negative for difficulty urinating, dysuria and hematuria.  Musculoskeletal: Positive for back pain.  Neurological: Negative for dizziness, syncope and weakness.  All other systems reviewed and are negative.   Physical Exam Updated Vital Signs BP 123/73    Pulse 70    Temp 98.7 F (37.1 C) (Oral)    Resp 18    LMP 07/02/1994    SpO2 99%   Physical Exam Vitals and nursing note reviewed.  Constitutional:      General: She is not in acute distress.    Appearance: She is well-developed. She is not diaphoretic.  HENT:     Head: Normocephalic and atraumatic.     Mouth/Throat:     Mouth: Mucous membranes are moist.     Pharynx: Oropharynx is clear.  Eyes:     Conjunctiva/sclera: Conjunctivae normal.  Cardiovascular:     Rate and Rhythm: Normal rate and regular rhythm.     Pulses: Normal pulses.          Radial pulses are 2+ on the right side and 2+ on the left side.     Heart sounds: Normal heart sounds.  Pulmonary:     Effort: Pulmonary effort is normal. No respiratory distress.     Breath sounds: Normal breath sounds.  Abdominal:     Palpations: Abdomen is soft.     Tenderness: There is no abdominal tenderness. There is no right CVA  tenderness, left CVA tenderness or guarding.     Comments: No tenderness in the abdomen whatsoever.  Musculoskeletal:     Cervical back: Neck supple.  Lymphadenopathy:     Cervical: No  cervical adenopathy.  Skin:    General: Skin is warm and dry.  Neurological:     Mental Status: She is alert.  Psychiatric:        Mood and Affect: Mood and affect normal.        Speech: Speech normal.        Behavior: Behavior normal.     ED Results / Procedures / Treatments   Labs (all labs ordered are listed, but only abnormal results are displayed) Labs Reviewed  URINALYSIS, ROUTINE W REFLEX MICROSCOPIC - Abnormal; Notable for the following components:      Result Value   Color, Urine STRAW (*)    Hgb urine dipstick MODERATE (*)    All other components within normal limits  BASIC METABOLIC PANEL - Abnormal; Notable for the following components:   Glucose, Bld 127 (*)    Calcium 8.8 (*)    GFR calc non Af Amer 58 (*)    All other components within normal limits  URINE CULTURE  CBC WITH DIFFERENTIAL/PLATELET    EKG None  Radiology CT Renal Stone Study  Result Date: 12/30/2019 CLINICAL DATA:  Flank pain EXAM: CT ABDOMEN AND PELVIS WITHOUT CONTRAST TECHNIQUE: Multidetector CT imaging of the abdomen and pelvis was performed following the standard protocol without IV contrast. COMPARISON:  Abdominal ultrasound dated 05/23/2014. FINDINGS: Lower chest: Minimal bibasilar atelectasis. Hepatobiliary: No focal liver abnormality is seen. No gallstones, gallbladder wall thickening, or biliary dilatation. Pancreas: Unremarkable. No pancreatic ductal dilatation or surrounding inflammatory changes. Spleen: Normal in size without focal abnormality. Adrenals/Urinary Tract: Adrenal glands are unremarkable. There is mild right hydroureteronephrosis and mild fat stranding around the right ureter. No calculus is seen in the right ureter or kidney, however a 3 mm calculus is seen in the bladder and may represent a recently passed stone. There is no left hydronephrosis. There is a nonobstructive 3 mm calculus in the left kidney. Bladder is otherwise unremarkable. Stomach/Bowel: Stomach is within  normal limits. Appendix appears normal. No evidence of bowel wall thickening, distention, or inflammatory changes. Vascular/Lymphatic: Aortic atherosclerosis. No enlarged abdominal or pelvic lymph nodes. Reproductive: Status post hysterectomy. No adnexal masses. Other: No abdominal wall hernia or abnormality. No abdominopelvic ascites. Musculoskeletal: Degenerative changes are seen in the spine. There is 4 mm anterolisthesis of L5 on S1. IMPRESSION: 1. Mild right hydroureteronephrosis and mild fat stranding around the right ureter. No calculus is seen in the right ureter or kidney, however a 3 mm calculus is seen in the bladder and likely represents a recently passed stone. Aortic Atherosclerosis (ICD10-I70.0). Electronically Signed   By: Zerita Boers M.D.   On: 12/30/2019 18:36    Procedures Procedures (including critical care time)  Medications Ordered in ED Medications - No data to display  ED Course  I have reviewed the triage vital signs and the nursing notes.  Pertinent labs & imaging results that were available during my care of the patient were reviewed by me and considered in my medical decision making (see chart for details).    MDM Rules/Calculators/A&P                          Patient presents with a complaint of right lower back and flank pain, resolved upon my  initial evaluation the patient in triage. Patient is nontoxic appearing, afebrile, not tachycardic, not tachypneic, not hypotensive, maintains excellent SPO2 on room air, and is in no apparent distress.   I have reviewed the patient's chart to obtain more information.   I reviewed and interpreted the patient's labs and radiological studies. No leukocytosis.  CT with evidence of passed kidney stone.  UA with microscopic hematuria, an expected finding with kidney stone, no evidence of infection.  The patient was given instructions for home care as well as return precautions. Patient voices understanding of these  instructions, accepts the plan, and is comfortable with discharge.  Findings and plan of care discussed with Dene Gentry, MD.   Vitals:   12/30/19 1348 12/30/19 1910  BP: (!) 153/76 123/73  Pulse: 79 70  Resp: 18 18  Temp: 98.7 F (37.1 C)   TempSrc: Oral   SpO2: 100% 99%     Final Clinical Impression(s) / ED Diagnoses Final diagnoses:  Ureteral colic    Rx / DC Orders ED Discharge Orders    None       Layla Maw 12/30/19 2139    Valarie Merino, MD 01/01/20 1110

## 2019-12-31 LAB — URINE CULTURE: Culture: NO GROWTH

## 2020-01-01 ENCOUNTER — Ambulatory Visit: Payer: Medicare Other | Admitting: Allergy and Immunology

## 2020-01-03 ENCOUNTER — Other Ambulatory Visit: Payer: Self-pay | Admitting: Physician Assistant

## 2020-01-04 NOTE — Telephone Encounter (Signed)
Last Visit: 12/05/2019 Next Visit: 06/24/2020  Last Fill: 11/30/2019  Okay to refill Ambien?

## 2020-01-11 ENCOUNTER — Ambulatory Visit: Payer: Medicare Other | Admitting: Interventional Cardiology

## 2020-01-15 NOTE — Progress Notes (Signed)
Cardiology Office Note:    Date:  01/16/2020   ID:  Christina Deleon, DOB Jul 28, 1950, MRN [id number]  PCP:  Wenda Low, MD  Cardiologist:  Sinclair Grooms, MD   Referring MD: Wenda Low, MD   Chief Complaint  Patient presents with  . Coronary Artery Disease    History of Present Illness:    Christina Deleon is a 69 y.o. female with a hx of hypertension, hyperlipidemia., asthma, and recent palpitations associated with dyspnea.    Here today to follow-up on cardiac evaluation for palpitations associated with dyspnea.  Please CV work-up below.  PACs were noted.  For correlation between complaints and PACs.  She has grade 1 diastolic dysfunction and also coronary calcium score 177.  We engaged in significant conversation concerning respiratory findings.  No specific therapy is recommended for PACs.  If she has prolonged palpitations she should call.  Diastolic dysfunction was discussed.  She is not having lifestyle altering shortness of breath or evidence of heart failure.  She understands the concept of a thick heart.  Asymptomatic coronary disease and risk prevention was discussed in great detail.  Past Medical History:  Diagnosis Date  . Adenomatous colon polyp 10/1997, and 03/2009  . Allergic rhinitis   . Allergy   . Arthritis    osteo  . Asthma   . Chronic gastritis   . Fibromyalgia   . GERD (gastroesophageal reflux disease)   . Hemorrhoids   . Hyperlipidemia   . Hypertension   . Menorrhagia    partial hysterectomy  . Migraines   . Osteoarthritis   . PONV (postoperative nausea and vomiting)   . Sleep disturbance   . 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 CATARACT EXTRACTION Bilateral 10/16 & 11/16  . EXCISION METACARPAL MASS Right 11/16/2016   Procedure: RIGHT LITTLE FINGER CYST EXCISION;  Surgeon: Milly Jakob, MD;  Location: [facility];  Service:  Orthopedics;  Laterality: Right;  . TONSILLECTOMY  age 38  . tooth extract     with bone graft  . Turbinate sinus surgery  2003  . VAGINAL HYSTERECTOMY  1998   secondary to prolapse, ovaries remain    Current Medications: Current Meds  Medication Sig  . albuterol (PROAIR HFA) 108 (90 Base) MCG/ACT inhaler Inhale 2 puffs into the lungs every 6 (six) hours as needed for wheezing or shortness of breath.  . ALPRAZolam (XANAX) 0.5 MG tablet as needed.  . Ascorbic Acid (VITAMIN C PO) Take 1 tablet by mouth daily.  . Azelastine-Fluticasone 137-50 MCG/ACT SUSP Use one spray in each nostril twice daily  . beclomethasone (QVAR REDIHALER) 40 MCG/ACT inhaler Inhale two puffs twice daily to prevent cough or wheeze.  Rinse, gargle, and spit after use.  Marland Kitchen CALCIUM PO Take 1 tablet by mouth daily.  . candesartan (ATACAND) 16 MG tablet Take 8 mg by mouth at bedtime.   . cetirizine (ZYRTEC) 10 MG tablet Take 1 tablet (10 mg total) by mouth daily. One tab daily for allergies  . Cholecalciferol (VITAMIN D) 2000 units tablet Take 2,000 Units by mouth daily.  . Cyanocobalamin (VITAMIN B-12 PO) Take 1 tablet by mouth daily.  . cyclobenzaprine (FLEXERIL) 5 MG tablet Start taking 2.5mg  (half-tab) at bedtime as needed for pain, ok to increase to 5mg  (1 tablet), if tolerating.  . famotidine (PEPCID) 40 MG tablet Take 1 tablet (40 mg  total) by mouth at bedtime.  . Fluticasone-Salmeterol,sensor, (AIRDUO DIGIHALER) 113-14 MCG/ACT AEPB Inhale one puff twice daily to prevent cough or wheeze.  Rinse, gargle, and spit after use.  Marland Kitchen Lifitegrast (XIIDRA) 5 % SOLN Apply 1 drop to eye 2 (two) times daily.  . methocarbamol (ROBAXIN) 500 MG tablet TAKE 1 TABLET BY MOUTH DAILY AS NEEDED FOR MUSCLE SPASMS  . montelukast (SINGULAIR) 10 MG tablet TAKE 1 TABLET(10 MG) BY MOUTH DAILY  . Multiple Vitamin (MULTIVITAMIN) tablet Take 1 tablet by mouth daily. Calcium, Vitamin B-12, Vitamin A, Vitamin C  . nabumetone (RELAFEN) 500 MG  tablet TAKE 1 TABLET(500 MG) BY MOUTH TWICE DAILY  . nystatin (MYCOSTATIN) 100000 UNIT/ML suspension Swish and swallow with 5 ml twice daily as directed.  . pantoprazole (PROTONIX) 40 MG tablet TAKE 1 TABLET(40 MG) BY MOUTH TWICE DAILY  . polyethylene glycol powder (GLYCOLAX/MIRALAX) powder Take 17 g by mouth daily.  . rosuvastatin (CRESTOR) 20 MG tablet Take 1 tablet (20 mg total) by mouth daily.  Marland Kitchen VITAMIN A PO Take by mouth daily.  Marland Kitchen zolmitriptan (ZOMIG-ZMT) 5 MG disintegrating tablet Take 1 tablet (5 mg total) by mouth as needed for migraine.  Marland Kitchen zolpidem (AMBIEN) 5 MG tablet TAKE 1 TABLET(5 MG) BY MOUTH AT BEDTIME AS NEEDED     Allergies:   Trazodone hcl, Clindamycin/lincomycin, and Penicillins   Social History   Socioeconomic History  . Marital status: Married    Spouse name: Not on file  . Number of children: 2  . Years of education: Not on file  . Highest education level: Not on file  Occupational History  . Occupation: Retired    Comment:  Metallurgist unemployed sincesince 6/10  Tobacco Use  . Smoking status: Never Smoker  . Smokeless tobacco: Never Used  . Tobacco comment: Father growing up  Vaping Use  . Vaping Use: Never used  Substance and Sexual Activity  . Alcohol use: No    Alcohol/week: 0.0 standard drinks  . Drug use: Never  . Sexual activity: Yes    Partners: Male    Birth control/protection: Surgical    Comment: hysterectomy  Other Topics Concern  . Not on file  Social History Narrative   She lives with husband.  They have 2 grown children.   She is retired Product/process development scientist.   Highest level of education:  2 years of college   Right handed   Regular exercise, diet of fruits, veggies, limited fried foods, limited water      [facility]:   Originally from [place]. Has also lived in [place]. Previously worked doing Software engineer. No pets currently. No bird, mold, or hot tub exposure. Does have a musty  smell in their home but it is new. Enjoys reading. 1 indoor plant. Has carpet in her home including in the bedroom. Also has draperies.    Social Determinants of Health   Financial Resource Strain:   . Difficulty of Paying Living Expenses: Not on file  Food Insecurity:   . Worried About Charity fundraiser in the Last Year: Not on file  . Ran Out of Food in the Last Year: Not on file  Transportation Needs:   . Lack of Transportation (Medical): Not on file  . Lack of Transportation (Non-Medical): Not on file  Physical Activity:   . Days of Exercise per Week: Not on file  . Minutes of Exercise per Session: Not on file  Stress:   . Feeling of  Stress : Not on file  Social Connections:   . Frequency of Communication with Friends and Family: Not on file  . Frequency of Social Gatherings with Friends and Family: Not on file  . Attends Religious Services: Not on file  . Active Member of Clubs or Organizations: Not on file  . Attends Archivist Meetings: Not on file  . Marital Status: Not on file     Family History: The patient's family history includes Coronary artery disease in her father, mother, and paternal grandmother; Diabetes in her brother, paternal aunt, and paternal grandmother; Heart attack (age of onset: 21) in her father; Heart disease in her brother; Hyperlipidemia in her mother; Hypertension in her mother; Lung disease in her mother; Migraines in her daughter and daughter; Prostate cancer in her brother and paternal uncle; Pulmonary fibrosis in her mother; Stroke in her father and maternal grandmother; Throat cancer in her father. There is no history of Colon cancer.  ROS:   Please see the history of present illness.    Her great concern is the known history of CAD in her family.  Palpitations have resolved.  She has been careful with caffeine and over-the-counter medications.  All other systems reviewed and are negative.  EKGs/Labs/Other Studies Reviewed:    The  following studies were reviewed today:  CORONARY CALCIUM SCORE 12/05/2019: IMPRESSION: 1. Coronary calcium score of 177. This was 78 percentile for age and sex matched control.  2.  Descending aortic atherosclerosis.  CARDIAC TELEMETRY MONITORING 12/24/2019: Study Highlights   Sinus rhythm  Rare PAC's  Symptom of flutter did not corrlate with significant arrhythmia. On one occasion there was an isolated [place]  No sustained atrial or ventricular rhythms   ECHOCARDIOGRAPHY 12/04/2019: IMPRESSIONS  1. Left ventricular ejection fraction, by estimation, is 60 to 65%. The  left ventricle has normal function. The left ventricle has no regional  wall motion abnormalities. Left ventricular diastolic parameters are  consistent with Grade I diastolic  dysfunction (impaired relaxation).  2. Right ventricular systolic function is normal. The right ventricular  size is normal.  3. The mitral valve is normal in structure. No evidence of mitral valve  regurgitation. No evidence of mitral stenosis.  4. The aortic valve is tricuspid. Aortic valve regurgitation is not  visualized. Mild aortic valve sclerosis is present, with no evidence of  aortic valve stenosis.  5. The inferior vena cava is normal in size with greater than 50%  respiratory variability, suggesting right atrial pressure of 3 mmHg.    EKG:  EKG not repeated  Recent Labs: 12/05/2019: ALT 9 12/30/2019: BUN 16; Creatinine, Ser 0.99; Hemoglobin 13.5; Platelets 241; Potassium 4.5; Sodium 138  Recent Lipid Panel    Component Value Date/Time   CHOL 167 12/18/2008 1034   TRIG 84.0 12/18/2008 1034   HDL 56.70 12/18/2008 1034   CHOLHDL 3 12/18/2008 1034   VLDL 16.8 12/18/2008 1034   LDLCALC 94 12/18/2008 1034    Physical Exam:    VS:  BP 128/74   Pulse 81   Ht 5\' 4"  (1.626 m)   Wt 148 lb 12.8 oz (67.5 kg)   LMP 07/02/1994   SpO2 97%   BMI 25.54 kg/m     Wt Readings from Last 3 Encounters:  01/16/20 148 lb 12.8 oz  (67.5 kg)  12/05/19 154 lb (69.9 kg)  11/29/19 154 lb (69.9 kg)     GEN: Appears younger than stated age. No acute distress HEENT: Normal NECK: No JVD. LYMPHATICS:  No lymphadenopathy CARDIAC:  RRR without murmur, gallop, or edema. VASCULAR:  Normal Pulses. No bruits. RESPIRATORY:  Clear to auscultation without rales, wheezing or rhonchi  ABDOMEN: Soft, non-tender, non-distended, No pulsatile mass, MUSCULOSKELETAL: No deformity  SKIN: Warm and dry NEUROLOGIC:  Alert and oriented x 3 PSYCHIATRIC:  Normal affect   ASSESSMENT:    1. Palpitations   2. DOE (dyspnea on exertion)   3. Essential hypertension   4. Hyperlipidemia LDL goal <70   5. Coronary artery disease involving native coronary artery of native heart without angina pectoris   6. Educated about COVID-19 virus infection    PLAN:    In order of problems listed above:  1. PACs were noted.  No correlation. 2. Dyspnea is not limiting.  She denies orthopnea.  No clinical evidence of heart failure on exam. 3. Target blood pressure 130/80 mmHg.  Continue At[place]and.  At some point in the future if needed diuretic therapy added. 4. LDL target should be less than 70.  And in the future, may need to achieve less than 55.  Crestor has been started because of LDL cholesterol level is greater than 100 historically.  Blood work is being drawn today. 5. The concept of primary prevention is discussed.  Risk modification was discussed in detail with the patient.  See below. 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  Overall education and awareness concerning primary risk prevention was discussed in detail: LDL less than 70, hemoglobin A1c less than 7, blood pressure target less than 130/80 mmHg, >150 minutes of moderate aerobic activity per week, avoidance of smoking, weight control (via diet and exercise), and continued surveillance/management of/for obstructive sleep apnea.  Clinical follow-up in 1 year.  Medication  Adjustments/Labs and Tests Ordered: Current medicines are reviewed at length with the patient today.  Concerns regarding medicines are outlined above.  Orders Placed This Encounter  Procedures  . Lipid panel  . Hepatic function panel   No orders of the defined types were placed in this encounter.   Patient Instructions  Medication Instructions:  Your physician recommends that you continue on your current medications as directed. Please refer to the Current Medication list given to you today.  *If you need a refill on your cardiac medications before your next appointment, please call your pharmacy*   Lab Work: Lipid and Liver today  If you have labs (blood work) drawn today and your tests are completely normal, you will receive your results only by: Marland Kitchen MyChart Message (if you have MyChart) OR . A paper copy in the mail If you have any lab test that is abnormal or we need to change your treatment, we will call you to review the results.   Testing/Procedures: None   Follow-Up: At [facility], you and your health needs are our priority.  As part of our continuing mission to provide you with exceptional heart care, we have created designated Provider Care Teams.  These Care Teams include your primary Cardiologist (physician) and Advanced Practice Providers (APPs -  Physician Assistants and Nurse Practitioners) who all work together to provide you with the care you need, when you need it.  We recommend signing up for the patient portal called "MyChart".  Sign up information is provided on this After Visit Summary.  MyChart is used to connect with patients for Virtual Visits (Telemedicine).  Patients are able to view lab/test results, encounter notes, upcoming appointments, etc.  Non-urgent messages can be sent to your provider as well.  To learn more about what you can do with MyChart, go to [email or web address].    Your next appointment:   12 month(s)  The format for your next  appointment:   In Person  Provider:   You may see Sinclair Grooms, MD or one of the following Advanced Practice Providers on your designated Care Team:    Truitt Merle, NP  Cecilie Kicks, NP  Kathyrn Drown, NP    Other Instructions  Your provider recommends that you maintain 150 minutes per week of moderate aerobic activity.  Your goal blood pressure is 130/80 or less.  LDL (bad) cholesterol needs to be 70 or lower.      Signed, Sinclair Grooms, MD  01/16/2020 9:39 AM    [place]

## 2020-01-16 ENCOUNTER — Ambulatory Visit (INDEPENDENT_AMBULATORY_CARE_PROVIDER_SITE_OTHER): Payer: Medicare Other | Admitting: Interventional Cardiology

## 2020-01-16 ENCOUNTER — Encounter: Payer: Self-pay | Admitting: Interventional Cardiology

## 2020-01-16 ENCOUNTER — Other Ambulatory Visit: Payer: Self-pay

## 2020-01-16 VITALS — BP 128/74 | HR 81 | Ht 64.0 in | Wt 148.8 lb

## 2020-01-16 DIAGNOSIS — I2510 Atherosclerotic heart disease of native coronary artery without angina pectoris: Secondary | ICD-10-CM

## 2020-01-16 DIAGNOSIS — Z7189 Other specified counseling: Secondary | ICD-10-CM

## 2020-01-16 DIAGNOSIS — R002 Palpitations: Secondary | ICD-10-CM

## 2020-01-16 DIAGNOSIS — E785 Hyperlipidemia, unspecified: Secondary | ICD-10-CM | POA: Diagnosis not present

## 2020-01-16 DIAGNOSIS — R0609 Other forms of dyspnea: Secondary | ICD-10-CM

## 2020-01-16 DIAGNOSIS — I10 Essential (primary) hypertension: Secondary | ICD-10-CM | POA: Diagnosis not present

## 2020-01-16 DIAGNOSIS — R0600 Dyspnea, unspecified: Secondary | ICD-10-CM

## 2020-01-16 LAB — LIPID PANEL
Chol/HDL Ratio: 2.1 ratio (ref 0.0–4.4)
Cholesterol, Total: 161 mg/dL (ref 100–199)
HDL: 78 mg/dL (ref >39)
LDL Chol Calc (NIH): 70 mg/dL (ref 0–99)
Triglycerides: 67 mg/dL (ref 0–149)
VLDL Cholesterol Cal: 13 mg/dL (ref 5–40)

## 2020-01-16 LAB — HEPATIC FUNCTION PANEL
ALT: 13 IU/L (ref 0–32)
AST: 18 IU/L (ref 0–40)
Albumin: 4.1 g/dL (ref 3.8–4.8)
Alkaline Phosphatase: 69 IU/L (ref 44–121)
Bilirubin Total: 0.3 mg/dL (ref 0.0–1.2)
Bilirubin, Direct: <0.1 mg/dL (ref 0.00–0.40)
Total Protein: 6.7 g/dL (ref 6.0–8.5)

## 2020-01-16 NOTE — Patient Instructions (Signed)
Medication Instructions:  Your physician recommends that you continue on your current medications as directed. Please refer to the Current Medication list given to you today.  *If you need a refill on your cardiac medications before your next appointment, please call your pharmacy*   Lab Work: Lipid and Liver today  If you have labs (blood work) drawn today and your tests are completely normal, you will receive your results only by: Marland Kitchen MyChart Message (if you have MyChart) OR . A paper copy in the mail If you have any lab test that is abnormal or we need to change your treatment, we will call you to review the results.   Testing/Procedures: None   Follow-Up: At [facility], you and your health needs are our priority.  As part of our continuing mission to provide you with exceptional heart care, we have created designated Provider Care Teams.  These Care Teams include your primary Cardiologist (physician) and Advanced Practice Providers (APPs -  Physician Assistants and Nurse Practitioners) who all work together to provide you with the care you need, when you need it.  We recommend signing up for the patient portal called "MyChart".  Sign up information is provided on this After Visit Summary.  MyChart is used to connect with patients for Virtual Visits (Telemedicine).  Patients are able to view lab/test results, encounter notes, upcoming appointments, etc.  Non-urgent messages can be sent to your provider as well.   To learn more about what you can do with MyChart, go to [email or web address].    Your next appointment:   12 month(s)  The format for your next appointment:   In Person  Provider:   You may see Sinclair Grooms, MD or one of the following Advanced Practice Providers on your designated Care Team:    Truitt Merle, NP  Cecilie Kicks, NP  Kathyrn Drown, NP    Other Instructions  Your provider recommends that you maintain 150 minutes per week of moderate  aerobic activity.  Your goal blood pressure is 130/80 or less.  LDL (bad) cholesterol needs to be 70 or lower.

## 2020-02-01 ENCOUNTER — Other Ambulatory Visit: Payer: Self-pay | Admitting: Physician Assistant

## 2020-02-01 NOTE — Telephone Encounter (Signed)
Last Visit: 12/05/2019 Next Visit: 06/24/2020  Last Fill: 01/04/2020   Okay to refill Ambien?

## 2020-02-08 ENCOUNTER — Ambulatory Visit: Payer: Medicare Other | Admitting: Neurology

## 2020-02-09 ENCOUNTER — Other Ambulatory Visit: Payer: Self-pay | Admitting: Allergy and Immunology

## 2020-02-28 ENCOUNTER — Other Ambulatory Visit: Payer: Self-pay | Admitting: Rheumatology

## 2020-02-29 NOTE — Telephone Encounter (Signed)
Last Visit: 12/05/2019 Next Visit: 06/24/2020  Last Fill: 02/02/2020  Okay to refill Ambien?

## 2020-03-03 NOTE — Progress Notes (Signed)
69 y.o. G24P2002 Married Black or [place] American Not Hispanic or Latino female here for annual exam.  H/o hysterectomy.  H/O genital prolapse, at her exam last year she was noted to have a grade 2 cystocele and rectocele and grade 1 vault prolapse. She doesn't thing it is any worse. Doesn't bother her on a daily basis. Not uncomfortable.  Her urine stream goes from normal to slow. Feels she is emptying overall, every once in a while she feels like she still has urine in her bladder. Her BM's were every day until she went on steroids for respiratory issues. She has been constipated for ~1week. Not really having issues emptying her bowels. Feels empty after she defecating.  No fecal incontinence.  She can have urge incontinence with a full bladder. Occurs ~1 x a week. Leaks a small amount.    Husband had been ill, but is doing better. So much stress from covid and life.  She hasn't seen her kids or grand kids for 2 years.   She is occasionally sexually active, dry.   She gets boils in the genital area. Worse when she is stressed, occurs every few months.    Patient's last menstrual period was 07/02/1994.          Sexually active: yes, on occasion The current method of family planning is status post hysterectomy.    Exercising: No.  no regular exercise routine  Smoker:  no  Health Maintenance: Pap:  10/01/08 patient unsure  History of abnormal Pap:  no MMG:  03/01/17 Bi-rads 1 neg is scheduled for next month  BMD:   04/11/14 Colonoscopy: 03/08/14, f/u 7 years TDaP:  Unsure  Gardasil: NA   reports that she has never smoked. She has never used smokeless tobacco. She reports that she does not drink alcohol and does not use drugs. 2 grown children, one in [place] and one in [place]. 2 step children. 4 grand children, 7 great grand children (between her and her husband). First husband died of CLL, he was her first love, [place] vet. Married to her second husband for 16 years.  Mom is 26, broke her  hip last month, s/p surgery in rehab. Doing well.   Past Medical History:  Diagnosis Date  . Adenomatous colon polyp 10/1997, and 03/2009  . Allergic rhinitis   . Allergy   . Arthritis    osteo  . Asthma   . Chronic gastritis   . Fibromyalgia   . GERD (gastroesophageal reflux disease)   . Hemorrhoids   . Hyperlipidemia   . Hypertension   . Menorrhagia    partial hysterectomy  . Migraines   . Osteoarthritis   . PONV (postoperative nausea and vomiting)   . Sleep disturbance   . 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 CATARACT EXTRACTION Bilateral 10/16 & 11/16  . EXCISION METACARPAL MASS Right 11/16/2016   Procedure: RIGHT LITTLE FINGER CYST EXCISION;  Surgeon: Milly Jakob, MD;  Location: [facility];  Service: Orthopedics;  Laterality: Right;  . TONSILLECTOMY  age 74  . tooth extract     with bone graft  . Turbinate sinus surgery  2003  . VAGINAL HYSTERECTOMY  1998   secondary to prolapse, ovaries remain    Current Outpatient Medications  Medication Sig Dispense Refill  . albuterol (PROAIR HFA) 108 (90 Base) MCG/ACT inhaler Inhale 2 puffs into the lungs every  6 (six) hours as needed for wheezing or shortness of breath. 18 g 1  . ALPRAZolam (XANAX) 0.5 MG tablet as needed.    . Ascorbic Acid (VITAMIN C PO) Take 1 tablet by mouth daily.    . Azelastine-Fluticasone 137-50 MCG/ACT SUSP Use one spray in each nostril twice daily 23 g 5  . CALCIUM PO Take 1 tablet by mouth daily.    . candesartan (ATACAND) 16 MG tablet Take 8 mg by mouth at bedtime.     . cetirizine (ZYRTEC) 10 MG tablet Take 1 tablet (10 mg total) by mouth daily. One tab daily for allergies 30 tablet 1  . Cholecalciferol (VITAMIN D) 2000 units tablet Take 2,000 Units by mouth daily.    . Cyanocobalamin (VITAMIN B-12 PO) Take 1 tablet by mouth daily.    . famotidine (PEPCID) 40 MG tablet Take 1 tablet  (40 mg total) by mouth at bedtime. 90 tablet 1  . fluticasone-salmeterol (ADVAIR HFA) 115-21 MCG/ACT inhaler 2 inhalations 1-2 times per day 12 g 5  . Lifitegrast (XIIDRA) 5 % SOLN Apply 1 drop to eye 2 (two) times daily.    . methocarbamol (ROBAXIN) 500 MG tablet TAKE 1 TABLET BY MOUTH DAILY AS NEEDED FOR MUSCLE SPASMS 90 tablet 0  . montelukast (SINGULAIR) 10 MG tablet TAKE 1 TABLET(10 MG) BY MOUTH DAILY 90 tablet 1  . Multiple Vitamin (MULTIVITAMIN) tablet Take 1 tablet by mouth daily. Calcium, Vitamin B-12, Vitamin A, Vitamin C    . nabumetone (RELAFEN) 500 MG tablet TAKE 1 TABLET(500 MG) BY MOUTH TWICE DAILY 60 tablet 1  . nystatin (MYCOSTATIN) 100000 UNIT/ML suspension Swish and swallow with 5 ml twice daily as directed. 300 mL 5  . pantoprazole (PROTONIX) 40 MG tablet TAKE 1 TABLET(40 MG) BY MOUTH TWICE DAILY 180 tablet 1  . rosuvastatin (CRESTOR) 20 MG tablet Take 1 tablet (20 mg total) by mouth daily. 90 tablet 3  . VITAMIN A PO Take by mouth daily.    Marland Kitchen zolmitriptan (ZOMIG-ZMT) 5 MG disintegrating tablet Take 1 tablet (5 mg total) by mouth as needed for migraine. 10 tablet 11  . zolpidem (AMBIEN) 5 MG tablet TAKE 1 TABLET(5 MG) BY MOUTH AT BEDTIME AS NEEDED 30 tablet 0  . polyethylene glycol powder (GLYCOLAX/MIRALAX) powder Take 17 g by mouth daily. 850 g 1   No current facility-administered medications for this visit.    Family History  Problem Relation Age of Onset  . Throat cancer Father   . Coronary artery disease Father   . Heart attack Father 47  . Stroke Father        during procedure  . Coronary artery disease Mother   . Hypertension Mother   . Hyperlipidemia Mother   . Pulmonary fibrosis Mother   . Lung disease Mother   . Prostate cancer Brother   . Heart disease Brother   . Diabetes Brother   . Diabetes Paternal Aunt        x 2  . Diabetes Paternal Grandmother   . Coronary artery disease Paternal Grandmother   . Stroke Maternal Grandmother        or MI  .  Prostate cancer Paternal Uncle   . Migraines Daughter   . Migraines Daughter   . Colon cancer Neg Hx     Review of Systems  All other systems reviewed and are negative.   Exam:   BP 110/66   Pulse 98   Ht 5\' 3"  (1.6 m)   Wt  152 lb 6.4 oz (69.1 kg)   LMP 07/02/1994   SpO2 100%   BMI 27.00 kg/m   Weight change: @WEIGHTCHANGE @ Height:   Height: 5\' 3"  (160 cm)  Ht Readings from Last 3 Encounters:  03/12/20 5\' 3"  (1.6 m)  03/11/20 5\' 4"  (1.626 m)  01/16/20 5\' 4"  (1.626 m)    General appearance: alert, cooperative and appears stated age Head: Normocephalic, without obvious abnormality, atraumatic Neck: no adenopathy, supple, symmetrical, trachea midline and thyroid normal to inspection and palpation Breasts: normal appearance, no masses or tenderness Abdomen: soft, non-tender; non distended,  no masses,  no organomegaly Extremities: extremities normal, atraumatic, no cyanosis or edema Skin: Skin color, texture, turgor normal. No rashes or lesions Lymph nodes: Cervical, supraclavicular, and axillary nodes normal. No abnormal inguinal nodes palpated Neurologic: Grossly normal   Pelvic: External genitalia:  no lesions              Urethra:  normal appearing urethra with no masses, tenderness or lesions              Bartholins and Skenes: normal                 Vagina: atrophic appearing vagina, grade 2 cystocele, grade 2 rectocele, grade 1 vault prolapse (only examined supine, with and without valsalva)              Cervix: absent               Bimanual Exam:  Uterus:  uterus absent              Adnexa: no mass, fullness, tenderness               Rectovaginal: Confirms               Anus:  normal sphincter tone, no lesions  Shanon Petty chaperoned for the exam.  A:  Well Woman with normal exam  Genital prolapse, stable  Urge urinary incontinence  Vaginal atrophy   P:   No pap needed  Mammogram scheduled  DEXA ordered  Kegel information given  Bladder training  information  Reviewed prolapse, ACOG handout given. Discussed option for pessary if needed. Discussed reducing her vagina if needed to void or defecate.   Use lubrication with intercourse   In addition to the breast and pelvic exam, over 20 minutes was spent in total patient care in regards to her prolapse, incontinence and vaginal dryness

## 2020-03-11 ENCOUNTER — Encounter: Payer: Self-pay | Admitting: Allergy and Immunology

## 2020-03-11 ENCOUNTER — Ambulatory Visit (INDEPENDENT_AMBULATORY_CARE_PROVIDER_SITE_OTHER): Payer: Medicare Other | Admitting: Allergy and Immunology

## 2020-03-11 ENCOUNTER — Other Ambulatory Visit: Payer: Self-pay

## 2020-03-11 VITALS — BP 98/72 | HR 100 | Temp 98.2°F | Resp 14 | Ht 64.0 in | Wt 153.6 lb

## 2020-03-11 DIAGNOSIS — K219 Gastro-esophageal reflux disease without esophagitis: Secondary | ICD-10-CM

## 2020-03-11 DIAGNOSIS — R442 Other hallucinations: Secondary | ICD-10-CM

## 2020-03-11 DIAGNOSIS — Z8262 Family history of osteoporosis: Secondary | ICD-10-CM

## 2020-03-11 DIAGNOSIS — I2510 Atherosclerotic heart disease of native coronary artery without angina pectoris: Secondary | ICD-10-CM

## 2020-03-11 DIAGNOSIS — J4540 Moderate persistent asthma, uncomplicated: Secondary | ICD-10-CM

## 2020-03-11 DIAGNOSIS — J3089 Other allergic rhinitis: Secondary | ICD-10-CM

## 2020-03-11 MED ORDER — AZITHROMYCIN 250 MG PO TABS
250.0000 mg | ORAL_TABLET | Freq: Every day | ORAL | 0 refills | Status: DC
Start: 2020-03-11 — End: 2020-03-12

## 2020-03-11 MED ORDER — ALBUTEROL SULFATE HFA 108 (90 BASE) MCG/ACT IN AERS: 2.0000 | INHALATION_SPRAY | Freq: Four times a day (QID) | RESPIRATORY_TRACT | 1 refills | Status: DC | PRN

## 2020-03-11 MED ORDER — ADVAIR HFA 115-21 MCG/ACT IN AERO
INHALATION_SPRAY | RESPIRATORY_TRACT | 5 refills | Status: DC
Start: 2020-03-11 — End: 2021-07-28

## 2020-03-11 NOTE — Patient Instructions (Addendum)
  1. Continue to treat reflux/LPR:    A. Pantoprazole 40mg  twice a day  B. Famotidine 40 mg in evening   2. Continue to treat and prevent inflammation:   A. Dymista -1 spray each nostril 2 times per day  B. montelukast 10mg  -1 time per day  C. Advair 115 HFA - 2 inhalations 1-2 times per day  3. For this episode use the following:   A. Azithromycin 250 mg - 1 tablet 1 time per day for 10 days  B. Prednisone 10 mg - 1 tablet 1 time per day for 10 days   C. Lots of nasal saline  D. Can add mucinex DM  E. Contact clinic if fungal growth occurs  4. Continue ProAir HFA and Zyrtec and Nystatin oral solution nasal saline as needed  5.  Return to clinic in 12 weeks or earlier if problem  6. Obtain Covid booster when better   7. Obtain bone density study

## 2020-03-11 NOTE — Progress Notes (Signed)
[facility]   Follow-up Note  Referring Provider: Wenda Low, MD Primary Provider: Wenda Low, MD Date of Office Visit: 03/11/2020  Subjective:   Christina Deleon (DOB: 06/01/1950) is a 69 y.o. female who returns to [facility] on 03/11/2020 in re-evaluation of the following:  HPI: Christina Deleon returns to this clinic in reevaluation of asthma and allergic rhinitis and reflux / LPR and migraine headache and sleep dysfunction. Her last visit to this clinic was 13 December 2019.  She became acutely ill about 7 days ago with some coughing and wheezing and nasal congestion and sniffing and snorting and just not feeling good in general without any fever or ugly nasal discharge for which she went to see her primary care doctor and was given prednisone over the course of the past 6 days.  This prednisone may have helped her cough but all of her other symptoms appear to be active.  She still continues to have some intermittent cough.  She has a collection of respiratory tract insults including asthma and LPR and it is difficult for her to discern which of the 2 is giving rise to her cough.  She did note that she has had more cough since she discontinued her Air Duo as her insurance company would not pay for this medication.  She use Symbicort but it gave rise to significant tremor and thus she was using Qvar but her insurance will no longer pay for Qvar.  She does believe that Air Duo gave rise to significant improvement regarding some of her chronic respiratory tract symptoms.  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  She occasionally uses nystatin in her mouth as this does appear to help some of the "fuzzy film" sensation in her mouth.  She still continues to have significant headaches and sleep dysfunction which is being evaluated and treated by a  neurologist.  She believes that her reflux is okay at this point in time.  She has a mom who recently fractured her hip and apparently has a history of osteoporosis.  Jossalin is going to have a bone density scan performed next month.  She has received 2 Pfizer Covid vaccines and is scheduled to get a booster and she has received the flu vaccine.  Allergies as of 03/11/2020      Reactions   Trazodone Hcl Other (See Comments)   REACTION: bad headaches   Clindamycin/lincomycin Other (See Comments)   headaches   Penicillins Nausea Only, Rash   Patient has taken Amoxicillin without an issue, per patient. Has patient had a PCN reaction causing immediate rash, facial/tongue/throat swelling, SOB or lightheadedness with hypotension: No Has patient had a PCN reaction causing severe rash involving mucus membranes or skin necrosis: No Has patient had a PCN reaction that required hospitalization No Has patient had a PCN reaction occurring within the last 10 years: No If all of the above answers are "NO", then may proceed with Cephalosporin use.      Medication List      AirDuo Digihaler 113-14 MCG/ACT Aepb Generic drug: Fluticasone-Salmeterol(sensor) Inhale one puff twice daily to prevent cough or wheeze.  Rinse, gargle, and spit after use.   albuterol 108 (90 Base) MCG/ACT inhaler Commonly known as: ProAir HFA Inhale 2 puffs into the lungs every 6 (six) hours as needed for wheezing or shortness of breath.  ALPRAZolam 0.5 MG tablet Commonly known as: XANAX as needed.   Azelastine-Fluticasone 137-50 MCG/ACT Susp Use one spray in each nostril twice daily   CALCIUM PO Take 1 tablet by mouth daily.   candesartan 16 MG tablet Commonly known as: ATACAND Take 8 mg by mouth at bedtime.   cetirizine 10 MG tablet Commonly known as: ZYRTEC Take 1 tablet (10 mg total) by mouth daily. One tab daily for allergies   cyclobenzaprine 5 MG tablet Commonly known as: FLEXERIL Start taking 2.5mg   (half-tab) at bedtime as needed for pain, ok to increase to 5mg  (1 tablet), if tolerating.   famotidine 40 MG tablet Commonly known as: Pepcid Take 1 tablet (40 mg total) by mouth at bedtime.   methocarbamol 500 MG tablet Commonly known as: ROBAXIN TAKE 1 TABLET BY MOUTH DAILY AS NEEDED FOR MUSCLE SPASMS   montelukast 10 MG tablet Commonly known as: SINGULAIR TAKE 1 TABLET(10 MG) BY MOUTH DAILY   multivitamin tablet Take 1 tablet by mouth daily. Calcium, Vitamin B-12, Vitamin A, Vitamin C   nabumetone 500 MG tablet Commonly known as: RELAFEN TAKE 1 TABLET(500 MG) BY MOUTH TWICE DAILY   nystatin 100000 UNIT/ML suspension Commonly known as: MYCOSTATIN Swish and swallow with 5 ml twice daily as directed.   pantoprazole 40 MG tablet Commonly known as: PROTONIX TAKE 1 TABLET(40 MG) BY MOUTH TWICE DAILY   polyethylene glycol powder 17 GM/SCOOP powder Commonly known as: GLYCOLAX/MIRALAX Take 17 g by mouth daily.   Qvar RediHaler 40 MCG/ACT inhaler Generic drug: beclomethasone Inhale two puffs twice daily to prevent cough or wheeze.  Rinse, gargle, and spit after use.   rosuvastatin 20 MG tablet Commonly known as: CRESTOR Take 1 tablet (20 mg total) by mouth daily.   VITAMIN A PO Take by mouth daily.   VITAMIN B-12 PO Take 1 tablet by mouth daily.   VITAMIN C PO Take 1 tablet by mouth daily.   Vitamin D 50 MCG (2000 UT) tablet Take 2,000 Units by mouth daily.   Xiidra 5 % Soln Generic drug: Lifitegrast Apply 1 drop to eye 2 (two) times daily.   zolmitriptan 5 MG disintegrating tablet Commonly known as: ZOMIG-ZMT Take 1 tablet (5 mg total) by mouth as needed for migraine.   zolpidem 5 MG tablet Commonly known as: AMBIEN TAKE 1 TABLET(5 MG) BY MOUTH AT BEDTIME AS NEEDED       Past Medical History:  Diagnosis Date  . Adenomatous colon polyp 10/1997, and 03/2009  . Allergic rhinitis   . Allergy   . Arthritis    osteo  . Asthma   . Chronic gastritis   .  Fibromyalgia   . GERD (gastroesophageal reflux disease)   . Hemorrhoids   . Hyperlipidemia   . Hypertension   . Menorrhagia    partial hysterectomy  . Migraines   . Osteoarthritis   . PONV (postoperative nausea and vomiting)   . Sleep disturbance   . 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 CATARACT EXTRACTION Bilateral 10/16 & 11/16  . EXCISION METACARPAL MASS Right 11/16/2016   Procedure: RIGHT LITTLE FINGER CYST EXCISION;  Surgeon: Milly Jakob, MD;  Location: [facility];  Service: Orthopedics;  Laterality: Right;  . TONSILLECTOMY  age 68  . tooth extract     with bone graft  . Turbinate sinus surgery  2003  . [place]  secondary to prolapse, ovaries remain    Review of systems negative except as noted in HPI / PMHx or noted below:  Review of Systems  Constitutional: Negative.   HENT: Negative.   Eyes: Negative.   Respiratory: Negative.   Cardiovascular: Negative.   Gastrointestinal: Negative.   Genitourinary: Negative.   Musculoskeletal: Negative.   Skin: Negative.   Neurological: Negative.   Endo/Heme/Allergies: Negative.   Psychiatric/Behavioral: Negative.      Objective:   Vitals:   03/11/20 0919  BP: 98/72  Pulse: 100  Resp: 14  Temp: 98.2 F (36.8 C)  SpO2: 100%   Height: 5\' 4"  (162.6 cm)  Weight: 153 lb 9.6 oz (69.7 kg)   Physical Exam Constitutional:      Appearance: She is not diaphoretic.     Comments: Raspy voice, cough  HENT:     Head: Normocephalic.     Right Ear: Tympanic membrane, ear canal and external ear normal.     Left Ear: Tympanic membrane, ear canal and external ear normal.     Nose: Nose normal. No mucosal edema (Erythematous) or rhinorrhea.     Mouth/Throat:     Pharynx: Uvula midline. Posterior oropharyngeal erythema present. No oropharyngeal exudate.  Eyes:     Conjunctiva/sclera:  Conjunctivae normal.  Neck:     Thyroid: No thyromegaly.     Trachea: Trachea normal. No tracheal tenderness or tracheal deviation.  Cardiovascular:     Rate and Rhythm: Normal rate and regular rhythm.     Heart sounds: Normal heart sounds, S1 normal and S2 normal. No murmur heard.   Pulmonary:     Effort: No respiratory distress.     Breath sounds: Normal breath sounds. No stridor. No wheezing or rales.  Lymphadenopathy:     Head:     Right side of head: No tonsillar adenopathy.     Left side of head: No tonsillar adenopathy.     Cervical: No cervical adenopathy.  Skin:    Findings: No erythema or rash.     Nails: There is no clubbing.  Neurological:     Mental Status: She is alert.     Diagnostics:    Spirometry was performed and demonstrated an FEV1 of 1.74 at 94 % of predicted.  Assessment and Plan:   1. Asthma, moderate persistent, well-controlled   2. Other allergic rhinitis   3. Phantosmia   4. LPRD (laryngopharyngeal reflux disease)   5. Family history of osteoporosis     1. Continue to treat reflux/LPR:    A. Pantoprazole 40 mg twice a day  B. Famotidine 40 mg in evening   2. Continue to treat and prevent inflammation:   A. Dymista -1 spray each nostril 2 times per day  B. montelukast 10mg  -1 time per day  C. Advair 115 HFA - 2 inhalations 1-2 times per day  3. For this episode use the following:   A. Azithromycin 250 mg - 1 tablet 1 time per day for 10 days  B. Prednisone 10 mg - 1 tablet 1 time per day for 10 days   C. Lots of nasal saline  D. Can add mucinex DM  E. Contact clinic if fungal growth occurs  4. Continue ProAir HFA and Zyrtec and Nystatin oral solution nasal saline as needed  5.  Return to clinic in 12 weeks or earlier if problem  6. Obtain Covid booster when better  7. Obtain bone density study   Kimyata appears to have a respiratory tract  infection and we will treat her with the therapy noted above and assume that she does well  with this plan.  For her chronic respiratory tract inflammation and irritation she will remain on therapy for reflux and I will start her on Advair.  Her previous use of Air Duo appeared to result in some improvement regarding her respiratory tract issues and she did not appear to have much side effect from this medication but unfortunately her insurance would not pay for this medication.  Advair and Air Duo will hopefully be interchangeable regarding control of her airway issues.  She has had problems with fungal overgrowth in the past and she can restart her nystatin oral solution should that develop.  We may need to give her a Diflucan tablet if she does develop significant fungal overgrowth issues while using her azithromycin and low-dose prednisone.  Assuming she does well I will see her back in this clinic in 12 weeks or earlier if there is a problem.  Given her history of extensive steroid use in the past and her family history of osteoporosis she definitely needs to obtain a bone density scan which is arranged for next month.   Allena Katz, MD Allergy / Immun[facility]

## 2020-03-12 ENCOUNTER — Encounter: Payer: Self-pay | Admitting: Obstetrics and Gynecology

## 2020-03-12 ENCOUNTER — Encounter: Payer: Self-pay | Admitting: Allergy and Immunology

## 2020-03-12 ENCOUNTER — Ambulatory Visit (INDEPENDENT_AMBULATORY_CARE_PROVIDER_SITE_OTHER): Payer: Medicare Other | Admitting: Obstetrics and Gynecology

## 2020-03-12 VITALS — BP 110/66 | HR 98 | Ht 63.0 in | Wt 152.4 lb

## 2020-03-12 DIAGNOSIS — N3941 Urge incontinence: Secondary | ICD-10-CM | POA: Diagnosis not present

## 2020-03-12 DIAGNOSIS — N816 Rectocele: Secondary | ICD-10-CM

## 2020-03-12 DIAGNOSIS — N8111 Cystocele, midline: Secondary | ICD-10-CM

## 2020-03-12 DIAGNOSIS — Z01419 Encounter for gynecological examination (general) (routine) without abnormal findings: Secondary | ICD-10-CM

## 2020-03-12 DIAGNOSIS — I2510 Atherosclerotic heart disease of native coronary artery without angina pectoris: Secondary | ICD-10-CM | POA: Diagnosis not present

## 2020-03-12 NOTE — Patient Instructions (Signed)
EXERCISE AND DIET:  We recommended that you start or continue a regular exercise program for good health. Regular exercise means any activity that makes your heart beat faster and makes you sweat.  We recommend exercising at least 30 minutes per day at least 3 days a week, preferably 4 or 5.  We also recommend a diet low in fat and sugar.  Inactivity, poor dietary choices and obesity can cause diabetes, heart attack, stroke, and kidney damage, among others.    ALCOHOL AND SMOKING:  Women should limit their alcohol intake to no more than 7 drinks/beers/glasses of wine (combined, not each!) per week. Moderation of alcohol intake to this level decreases your risk of breast cancer and liver damage. And of course, no recreational drugs are part of a healthy lifestyle.  And absolutely no smoking or even second hand smoke. Most people know smoking can cause heart and lung diseases, but did you know it also contributes to weakening of your bones? Aging of your skin?  Yellowing of your teeth and nails?  CALCIUM AND VITAMIN D:  Adequate intake of calcium and Vitamin D are recommended.  The recommendations for exact amounts of these supplements seem to change often, but generally speaking 1,200 mg of calcium (between diet and supplement) and 800 units of Vitamin D per day seems prudent. Certain women may benefit from higher intake of Vitamin D.  If you are among these women, your doctor will have told you during your visit.    PAP SMEARS:  Pap smears, to check for cervical cancer or precancers,  have traditionally been done yearly, although recent scientific advances have shown that most women can have pap smears less often.  However, every woman still should have a physical exam from her gynecologist every year. It will include a breast check, inspection of the vulva and vagina to check for abnormal growths or skin changes, a visual exam of the cervix, and then an exam to evaluate the size and shape of the uterus and  ovaries.  And after 70 years of age, a rectal exam is indicated to check for rectal cancers. We will also provide age appropriate advice regarding health maintenance, like when you should have certain vaccines, screening for sexually transmitted diseases, bone density testing, colonoscopy, mammograms, etc.   MAMMOGRAMS:  All women over 40 years old should have a yearly mammogram. Many facilities now offer a "3D" mammogram, which may cost around $50 extra out of pocket. If possible,  we recommend you accept the option to have the 3D mammogram performed.  It both reduces the number of women who will be called back for extra views which then turn out to be normal, and it is better than the routine mammogram at detecting truly abnormal areas.    COLON CANCER SCREENING: Now recommend starting at age 45. At this time colonoscopy is not covered for routine screening until 50. There are take home tests that can be done between 45-49.   COLONOSCOPY:  Colonoscopy to screen for colon cancer is recommended for all women at age 50.  We know, you hate the idea of the prep.  We agree, BUT, having colon cancer and not knowing it is worse!!  Colon cancer so often starts as a polyp that can be seen and removed at colonscopy, which can quite literally save your life!  And if your first colonoscopy is normal and you have no family history of colon cancer, most women don't have to have it again for   10 years.  Once every ten years, you can do something that may end up saving your life, right?  We will be happy to help you get it scheduled when you are ready.  Be sure to check your insurance coverage so you understand how much it will cost.  It may be covered as a preventative service at no cost, but you should check your particular policy.      Breast Self-Awareness Breast self-awareness means being familiar with how your breasts look and feel. It involves checking your breasts regularly and reporting any changes to your  health care provider. Practicing breast self-awareness is important. A change in your breasts can be a sign of a serious medical problem. Being familiar with how your breasts look and feel allows you to find any problems early, when treatment is more likely to be successful. All women should practice breast self-awareness, including women who have had breast implants. How to do a breast self-exam One way to learn what is normal for your breasts and whether your breasts are changing is to do a breast self-exam. To do a breast self-exam: Look for Changes  1. Remove all the clothing above your waist. 2. Stand in front of a mirror in a room with good lighting. 3. Put your hands on your hips. 4. Push your hands firmly downward. 5. Compare your breasts in the mirror. Look for differences between them (asymmetry), such as: ? Differences in shape. ? Differences in size. ? Puckers, dips, and bumps in one breast and not the other. 6. Look at each breast for changes in your skin, such as: ? Redness. ? Scaly areas. 7. Look for changes in your nipples, such as: ? Discharge. ? Bleeding. ? Dimpling. ? Redness. ? A change in position. Feel for Changes Carefully feel your breasts for lumps and changes. It is best to do this while lying on your back on the floor and again while sitting or standing in the shower or tub with soapy water on your skin. Feel each breast in the following way:  Place the arm on the side of the breast you are examining above your head.  Feel your breast with the other hand.  Start in the nipple area and make  inch (2 cm) overlapping circles to feel your breast. Use the pads of your three middle fingers to do this. Apply light pressure, then medium pressure, then firm pressure. The light pressure will allow you to feel the tissue closest to the skin. The medium pressure will allow you to feel the tissue that is a little deeper. The firm pressure will allow you to feel the tissue  close to the ribs.  Continue the overlapping circles, moving downward over the breast until you feel your ribs below your breast.  Move one finger-width toward the center of the body. Continue to use the  inch (2 cm) overlapping circles to feel your breast as you move slowly up toward your collarbone.  Continue the up and down exam using all three pressures until you reach your armpit.  Write Down What You Find  Write down what is normal for each breast and any changes that you find. Keep a written record with breast changes or normal findings for each breast. By writing this information down, you do not need to depend only on memory for size, tenderness, or location. Write down where you are in your menstrual cycle, if you are still menstruating. If you are having trouble noticing differences   in your breasts, do not get discouraged. With time you will become more familiar with the variations in your breasts and more comfortable with the exam. How often should I examine my breasts? Examine your breasts every month. If you are breastfeeding, the best time to examine your breasts is after a feeding or after using a breast pump. If you menstruate, the best time to examine your breasts is 5-7 days after your period is over. During your period, your breasts are lumpier, and it may be more difficult to notice changes. When should I see my health care provider? See your health care provider if you notice:  A change in shape or size of your breasts or nipples.  A change in the skin of your breast or nipples, such as a reddened or scaly area.  Unusual discharge from your nipples.  A lump or thick area that was not there before.  Pain in your breasts.  Anything that concerns you.  Kegel Exercises  Kegel exercises can help strengthen your pelvic floor muscles. The pelvic floor is a group of muscles that support your rectum, small intestine, and bladder. In females, pelvic floor muscles also help  support the womb (uterus). These muscles help you control the flow of urine and stool. Kegel exercises are painless and simple, and they do not require any equipment. Your provider may suggest Kegel exercises to:  Improve bladder and bowel control.  Improve sexual response.  Improve weak pelvic floor muscles after surgery to remove the uterus (hysterectomy) or pregnancy (females).  Improve weak pelvic floor muscles after prostate gland removal or surgery (males). Kegel exercises involve squeezing your pelvic floor muscles, which are the same muscles you squeeze when you try to stop the flow of urine or keep from passing gas. The exercises can be done while sitting, standing, or lying down, but it is best to vary your position. Exercises How to do Kegel exercises: 1. Squeeze your pelvic floor muscles tight. You should feel a tight lift in your rectal area. If you are a female, you should also feel a tightness in your vaginal area. Keep your stomach, buttocks, and legs relaxed. 2. Hold the muscles tight for up to 10 seconds. 3. Breathe normally. 4. Relax your muscles. 5. Repeat as told by your health care provider. Repeat this exercise daily as told by your health care provider. Continue to do this exercise for at least 4-6 weeks, or for as long as told by your health care provider. You may be referred to a physical therapist who can help you learn more about how to do Kegel exercises. Depending on your condition, your health care provider may recommend:  Varying how long you squeeze your muscles.  Doing several sets of exercises every day.  Doing exercises for several weeks.  Making Kegel exercises a part of your regular exercise routine. This information is not intended to replace advice given to you by your health care provider. Make sure you discuss any questions you have with your health care provider. Document Revised: 12/07/2017 Document Reviewed: 12/07/2017 Elsevier Patient  Education  [place]. Urinary Incontinence  Urinary incontinence refers to a condition in which a person is unable to control where and when to pass urine. A person with this condition will urinate when he or she does not mean to (involuntarily). What are the causes? This condition may be caused by:  Medicines.  Infections.  Constipation.  Overactive bladder muscles.  Weak bladder muscles.  Weak pelvic  floor muscles. These muscles provide support for the bladder, intestine, and, in women, the uterus.  Enlarged prostate in men. The prostate is a gland near the bladder. When it gets too big, it can pinch the urethra. With the urethra blocked, the bladder can weaken and lose the ability to empty properly.  Surgery.  Emotional factors, such as anxiety, stress, or post-traumatic stress disorder (PTSD).  Pelvic organ prolapse. This happens in women when organs shift out of place and into the vagina. This shift can prevent the bladder and urethra from working properly. What increases the risk? The following factors may make you more likely to develop this condition:  Older age.  Obesity and physical inactivity.  Pregnancy and childbirth.  Menopause.  Diseases that affect the nerves or spinal cord (neurological diseases).  Long-term (chronic) coughing. This can increase pressure on the bladder and pelvic floor muscles. What are the signs or symptoms? Symptoms may vary depending on the type of urinary incontinence you have. They include:  A sudden urge to urinate, but passing urine involuntarily before you can get to a bathroom (urge incontinence).  Suddenly passing urine with any activity that forces urine to pass, such as coughing, laughing, exercise, or sneezing (stress incontinence).  Needing to urinate often, but urinating only a small amount, or constantly dribbling urine (overflow incontinence).  Urinating because you cannot get to the bathroom in time due to a  physical disability, such as arthritis or injury, or communication and thinking problems, such as Alzheimer disease (functional incontinence). How is this diagnosed? This condition may be diagnosed based on:  Your medical history.  A physical exam.  Tests, such as: ? Urine tests. ? X-rays of your kidney and bladder. ? Ultrasound. ? CT scan. ? Cystoscopy. In this procedure, a health care provider inserts a tube with a light and camera (cystoscope) through the urethra and into the bladder in order to check for problems. ? Urodynamic testing. These tests assess how well the bladder, urethra, and sphincter can store and release urine. There are different types of urodynamic tests, and they vary depending on what the test is measuring. To help diagnose your condition, your health care provider may recommend that you keep a log of when you urinate and how much you urinate. How is this treated? Treatment for this condition depends on the type of incontinence that you have and its cause. Treatment may include:  Lifestyle changes, such as: ? Quitting smoking. ? Maintaining a healthy weight. ? Staying active. Try to get 150 minutes of moderate-intensity exercise every week. Ask your health care provider which activities are safe for you. ? Eating a healthy diet.  Avoid high-fat foods, like fried foods.  Avoid refined carbohydrates like white bread and white rice.  Limit how much alcohol and caffeine you drink.  Increase your fiber intake. Foods such as fresh fruits, vegetables, beans, and whole grains are healthy sources of fiber.  Pelvic floor muscle exercises.  Bladder training, such as lengthening the amount of time between bathroom breaks, or using the bathroom at regular intervals.  Using techniques to suppress bladder urges. This can include distraction techniques or controlled breathing exercises.  Medicines to relax the bladder muscles and prevent bladder spasms.  Medicines to  help slow or prevent the growth of a man's prostate.  Botox injections. These can help relax the bladder muscles.  Using pulses of electricity to help change bladder reflexes (electrical nerve stimulation).  For women, using a medical device to prevent  urine leaks. This is a small, tampon-like, disposable device that is inserted into the urethra.  Injecting collagen or carbon beads (bulking agents) into the urinary sphincter. These can help thicken tissue and close the bladder opening.  Surgery. Follow these instructions at home: Lifestyle  Limit alcohol and caffeine. These can fill your bladder quickly and irritate it.  Keep yourself clean to help prevent odors and skin damage. Ask your doctor about special skin creams and cleansers that can protect the skin from urine.  Consider wearing pads or adult diapers. Make sure to change them regularly, and always change them right after experiencing incontinence. General instructions  Take over-the-counter and prescription medicines only as told by your health care provider.  Use the bathroom about every 3-4 hours, even if you do not feel the need to urinate. Try to empty your bladder completely every time. After urinating, wait a minute. Then try to urinate again.  Make sure you are in a relaxed position while urinating.  If your incontinence is caused by nerve problems, keep a log of the medicines you take and the times you go to the bathroom.  Keep all follow-up visits as told by your health care provider. This is important. Contact a health care provider if:  You have pain that gets worse.  Your incontinence gets worse. Get help right away if:  You have a fever or chills.  You are unable to urinate.  You have redness in your groin area or down your legs. Summary  Urinary incontinence refers to a condition in which a person is unable to control where and when to pass urine.  This condition may be caused by medicines,  infection, weak bladder muscles, weak pelvic floor muscles, enlargement of the prostate (in men), or surgery.  The following factors increase your risk for developing this condition: older age, obesity, pregnancy and childbirth, menopause, neurological diseases, and chronic coughing.  There are several types of urinary incontinence. They include urge incontinence, stress incontinence, overflow incontinence, and functional incontinence.  This condition is usually treated first with lifestyle and behavioral changes, such as quitting smoking, eating a healthier diet, and doing regular pelvic floor exercises. Other treatment options include medicines, bulking agents, medical devices, electrical nerve stimulation, or surgery. This information is not intended to replace advice given to you by your health care provider. Make sure you discuss any questions you have with your health care provider. Document Revised: 04/29/2017 Document Reviewed: 07/29/2016 Elsevier Patient Education  [place].

## 2020-03-14 ENCOUNTER — Other Ambulatory Visit: Payer: Self-pay | Admitting: Internal Medicine

## 2020-03-14 DIAGNOSIS — M8580 Other specified disorders of bone density and structure, unspecified site: Secondary | ICD-10-CM

## 2020-03-24 ENCOUNTER — Other Ambulatory Visit: Payer: Self-pay | Admitting: Neurology

## 2020-03-28 ENCOUNTER — Other Ambulatory Visit: Payer: Self-pay | Admitting: Rheumatology

## 2020-03-31 ENCOUNTER — Ambulatory Visit (INDEPENDENT_AMBULATORY_CARE_PROVIDER_SITE_OTHER): Payer: Medicare Other | Admitting: Neurology

## 2020-03-31 ENCOUNTER — Other Ambulatory Visit: Payer: Self-pay

## 2020-03-31 ENCOUNTER — Encounter: Payer: Self-pay | Admitting: Neurology

## 2020-03-31 VITALS — BP 135/83 | HR 95 | Resp 18 | Ht 64.0 in | Wt 155.0 lb

## 2020-03-31 DIAGNOSIS — R519 Headache, unspecified: Secondary | ICD-10-CM | POA: Diagnosis not present

## 2020-03-31 DIAGNOSIS — G43009 Migraine without aura, not intractable, without status migrainosus: Secondary | ICD-10-CM | POA: Diagnosis not present

## 2020-03-31 DIAGNOSIS — I2510 Atherosclerotic heart disease of native coronary artery without angina pectoris: Secondary | ICD-10-CM | POA: Diagnosis not present

## 2020-03-31 NOTE — Progress Notes (Signed)
Follow-up Visit   Date: 03/31/20   CING [place]wood MRN: [id number] DOB: 07-Mar-1951   Interim History: Christina Deleon is a 69 y.o. right-handed female with GERD, hypertension, hyperlipidemia, and fibromyalgia returning to the clinic for follow-up of chronic migraine and periodic limb movements.  The patient was accompanied to the clinic by self.  She reports no change in the frequency of her headaches; in fact, she feels they are occurring more frequently and now every morning upon awakening.  Headaches starts at the base of her neck and radiates up her scalp.  She is not aware of having OSA and had prior sleep study.  She has been doing home neck stretching exercises and takes robaxin as needed.  She takes zomig up to four times per week to get relief.    Medications:  Current Outpatient Medications on File Prior to Visit  Medication Sig Dispense Refill  . albuterol (PROAIR HFA) 108 (90 Base) MCG/ACT inhaler Inhale 2 puffs into the lungs every 6 (six) hours as needed for wheezing or shortness of breath. 18 g 1  . ALPRAZolam (XANAX) 0.5 MG tablet as needed.    . Ascorbic Acid (VITAMIN C PO) Take 1 tablet by mouth daily.    . Azelastine-Fluticasone 137-50 MCG/ACT SUSP Use one spray in each nostril twice daily 23 g 5  . CALCIUM PO Take 1 tablet by mouth daily.    . candesartan (ATACAND) 16 MG tablet Take 8 mg by mouth at bedtime.     . cetirizine (ZYRTEC) 10 MG tablet Take 1 tablet (10 mg total) by mouth daily. One tab daily for allergies 30 tablet 1  . Cholecalciferol (VITAMIN D) 2000 units tablet Take 2,000 Units by mouth daily.    . Cyanocobalamin (VITAMIN B-12 PO) Take 1 tablet by mouth daily.    . famotidine (PEPCID) 40 MG tablet Take 1 tablet (40 mg total) by mouth at bedtime. 90 tablet 1  . fluticasone-salmeterol (ADVAIR HFA) 115-21 MCG/ACT inhaler 2 inhalations 1-2 times per day 12 g 5  . Lifitegrast (XIIDRA) 5 % SOLN Apply 1 drop to eye 2 (two) times daily.    . methocarbamol  (ROBAXIN) 500 MG tablet TAKE 1 TABLET BY MOUTH DAILY AS NEEDED FOR MUSCLE SPASMS 90 tablet 0  . montelukast (SINGULAIR) 10 MG tablet TAKE 1 TABLET(10 MG) BY MOUTH DAILY 90 tablet 1  . Multiple Vitamin (MULTIVITAMIN) tablet Take 1 tablet by mouth daily. Calcium, Vitamin B-12, Vitamin A, Vitamin C    . nabumetone (RELAFEN) 500 MG tablet TAKE 1 TABLET(500 MG) BY MOUTH TWICE DAILY 60 tablet 1  . nystatin (MYCOSTATIN) 100000 UNIT/ML suspension Swish and swallow with 5 ml twice daily as directed. 300 mL 5  . pantoprazole (PROTONIX) 40 MG tablet TAKE 1 TABLET(40 MG) BY MOUTH TWICE DAILY 180 tablet 1  . polyethylene glycol powder (GLYCOLAX/MIRALAX) powder Take 17 g by mouth daily. 850 g 1  . rosuvastatin (CRESTOR) 20 MG tablet Take 1 tablet (20 mg total) by mouth daily. 90 tablet 3  . VITAMIN A PO Take by mouth daily.    Marland Kitchen zolmitriptan (ZOMIG-ZMT) 5 MG disintegrating tablet TAKE 1 TABLET BY MOUTH AS NEEDED FOR MIGRAINE 10 tablet 2  . zolpidem (AMBIEN) 5 MG tablet TAKE 1 TABLET(5 MG) BY MOUTH AT BEDTIME AS NEEDED 30 tablet 0   No current facility-administered medications on file prior to visit.    Allergies:  Allergies  Allergen Reactions  . Trazodone Hcl Other (See Comments)  REACTION: bad headaches  . Clindamycin/Lincomycin Other (See Comments)    headaches  . Penicillins Nausea Only and Rash    Patient has taken Amoxicillin without an issue, per patient. Has patient had a PCN reaction causing immediate rash, facial/tongue/throat swelling, SOB or lightheadedness with hypotension: No Has patient had a PCN reaction causing severe rash involving mucus membranes or skin necrosis: No Has patient had a PCN reaction that required hospitalization No Has patient had a PCN reaction occurring within the last 10 years: No If all of the above answers are "NO", then may proceed with Cephalosporin use.     Vital Signs:  BP 135/83   Pulse 95   Resp 18   Ht 5\' 4"  (1.626 m)   Wt 155 lb (70.3 kg)   LMP  07/02/1994   SpO2 98%   BMI 26.61 kg/m   Neurological Exam: MENTAL STATUS including orientation to time, place, person, recent and remote memory, attention span and concentration, language, and fund of knowledge is normal.  Speech is not dysarthric.  CRANIAL NERVES:   Face is symmetric.   MOTOR:  Motor strength is 5/5 in all extremities. Neck ROM is limited with side-to-side rotation.  Neck strength is 5/5.  No atrophy, fasciculations or abnormal movements.    MSRs:  Reflexes are 2+/4 throughout  COORDINATION/GAIT:   Gait narrow based and stable.   Data: n/a  IMPRESSION/PLAN: 1.  Morning headache, worsening  - MRI/A head wo contrast due to change in intensity of pain and worsening headaches upon awakening  - Limit Zomig to twice per week  - PT declined  - Preventative agent declined (nortriptyline)  2.  episodic migraine, triggered by stress  -Continue Zomig 2.5 - 5mg   - limit to twice per week  2.  Periodic limb movements of sleep  -Continue iron supplements and Robaxin as needed  Return to clinic in 6 months  Thank you for allowing me to participate in patient's care.  If I can answer any additional questions, I would be pleased to do so.    Sincerely,    Dorene Bruni K. Posey Pronto, DO

## 2020-03-31 NOTE — Patient Instructions (Addendum)
MRI/A head Limit zomig to twice per week OK to continue robaxin 500mg  at bedtime If you would like to start preventative medication for headaches, please contact my office Continue neck stretching exercises at home

## 2020-04-04 ENCOUNTER — Ambulatory Visit: Payer: Medicare Other | Attending: Internal Medicine

## 2020-04-04 DIAGNOSIS — Z23 Encounter for immunization: Secondary | ICD-10-CM

## 2020-04-04 NOTE — Progress Notes (Signed)
   Covid-19 Vaccination Clinic  Name:  Christina Deleon    MRN: [id number] DOB: [birth year]  04/04/2020  [name] was observed post Covid-19 immunization for 15 minutes without incident. She was provided with Vaccine Information Sheet and instruction to access the V-Safe system.   [name] was instructed to call 911 with any severe reactions post vaccine: Marland Kitchen Difficulty breathing  . Swelling of face and throat  . A fast heartbeat  . A bad rash all over body  . Dizziness and weakness   Immunizations Administered    Name Date Dose VIS Date Route   Pfizer COVID-19 Vaccine 04/04/2020  2:26 PM 0.3 mL 02/20/2020 Intramuscular   Manufacturer: [place]   Lot: [id number]   NDC: [address]-4

## 2020-04-24 ENCOUNTER — Other Ambulatory Visit: Payer: Self-pay | Admitting: Rheumatology

## 2020-04-24 NOTE — Telephone Encounter (Signed)
Last Visit: 12/05/2019 Next Visit: 06/24/2020  Last Fill: 03/28/2020  Okay to refill Ambien?

## 2020-04-28 ENCOUNTER — Telehealth: Payer: Self-pay | Admitting: Neurology

## 2020-04-28 NOTE — Telephone Encounter (Signed)
Called patient back and left a message to call the imaging facility directly to reschedule that appointment.

## 2020-05-01 ENCOUNTER — Other Ambulatory Visit: Payer: Medicare Other

## 2020-05-15 ENCOUNTER — Other Ambulatory Visit: Payer: Self-pay | Admitting: Allergy and Immunology

## 2020-05-21 NOTE — Progress Notes (Deleted)
Office Visit Note  Patient: Christina Deleon             Date of Birth: 1950-12-26           MRN: [id number]             PCP: Wenda Low, MD Referring: Wenda Low, MD Visit Date: 06/04/2020 Occupation: @GUAROCC @  Subjective:  No chief complaint on file.   History of Present Illness: IVANNA Deleon is a 70 y.o. female ***   Activities of Daily Living:  Patient reports morning stiffness for *** {minute/hour:19697}.   Patient {ACTIONS;DENIES/REPORTS:21021675::"Denies"} nocturnal pain.  Difficulty dressing/grooming: {ACTIONS;DENIES/REPORTS:21021675::"Denies"} Difficulty climbing stairs: {ACTIONS;DENIES/REPORTS:21021675::"Denies"} Difficulty getting out of chair: {ACTIONS;DENIES/REPORTS:21021675::"Denies"} Difficulty using hands for taps, buttons, cutlery, and/or writing: {ACTIONS;DENIES/REPORTS:21021675::"Denies"}  No Rheumatology ROS completed.   PMFS History:  Patient Active Problem List   Diagnosis Date Noted  . Dysosmia 12/18/2019  . Cough variant asthma vs uacs  04/13/2018  . Fibromyalgia 04/19/2016  . Other fatigue 04/19/2016  . 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 Primary osteoarthritis of both knees 04/19/2016  . Leg pain 01/22/2016  . Restless legs syndrome 01/22/2016  . Snoring 11/19/2015  . Periodic limb movement sleep disorder 11/19/2015  . GERD (gastroesophageal reflux disease) 10/08/2015  . Mild persistent asthma 04/09/2015  . LPRD (laryngopharyngeal reflux disease) 04/09/2015  . Allergic rhinoconjunctivitis 04/09/2015  . HEMATOCHEZIA 04/01/2010  . COUGH 04/01/2010  . GASTRITIS 09/30/2009  . NAUSEA 09/30/2009  . ABDOMINAL PAIN-EPIGASTRIC 09/30/2009  . DYSTHYMIC DISORDER 09/26/2009  . PALPITATIONS 06/17/2009  . Impingement syndrome of shoulder, left 06/04/2009  . CONSTIPATION 02/05/2009  . PERSONAL HX COLONIC POLYPS 02/05/2009  . ASTHMA, PERSISTENT, MILD 02/04/2009  . CHEST PAIN, ATYPICAL 02/04/2009  . COLONIC POLYPS, BENIGN 12/18/2008  . Hyperlipidemia  LDL goal <70 12/18/2008  . Insomnia 12/18/2008  . MIGRAINE, COMMON 12/18/2008  . Essential hypertension 12/18/2008  . ALLERGIC RHINITIS 12/18/2008  . Primary osteoarthritis of both hands 12/18/2008  . DJD (degenerative joint disease), cervical 12/18/2008  . NECK PAIN, CHRONIC 12/18/2008  . LOW BACK PAIN, CHRONIC 12/18/2008    Past Medical History:  Diagnosis Date  . Adenomatous colon polyp 10/1997, and 03/2009  . Allergic rhinitis   . Allergy   . Arthritis    osteo  . Asthma   . Chronic gastritis   . Fibromyalgia   . GERD (gastroesophageal reflux disease)   . Hemorrhoids   . Hyperlipidemia   . Hypertension   . Menorrhagia    partial hysterectomy  . Migraines   . Osteoarthritis   . PONV (postoperative nausea and vomiting)   . Sleep disturbance   . STD (sexually transmitted disease) 01/22/11 culture proven   HSV type I labia    Family History  Problem Relation Age of Onset  . Throat cancer Father   . Coronary artery disease Father   . Heart attack Father 22  . Stroke Father        during procedure  . Coronary artery disease Mother   . Hypertension Mother   . Hyperlipidemia Mother   . Pulmonary fibrosis Mother   . Lung disease Mother   . Prostate cancer Brother   . Heart disease Brother   . Diabetes Brother   . Diabetes Paternal Aunt        x 2  . Diabetes Paternal Grandmother   . Coronary artery disease Paternal Grandmother   . Stroke Maternal Grandmother        or MI  . Prostate cancer Paternal  Uncle   . Migraines Daughter   . Migraines Daughter   . Colon cancer Neg Hx    Past Surgical History:  Procedure Laterality Date  . CATARACT EXTRACTION Bilateral 10/16 & 11/16  . EXCISION METACARPAL MASS Right 11/16/2016   Procedure: RIGHT LITTLE FINGER CYST EXCISION;  Surgeon: Milly Jakob, MD;  Location: [facility];  Service: Orthopedics;  Laterality: Right;  . TONSILLECTOMY  age 66  . tooth extract     with bone graft  . Turbinate sinus  surgery  2003  . VAGINAL HYSTERECTOMY  1998   secondary to prolapse, ovaries remain   Social History   Social History Narrative   She lives with husband.  They have 2 grown children.   She is retired Product/process development scientist.   Highest level of education:  2 years of college   Right handed   Regular exercise, diet of fruits, veggies, limited fried foods, limited water      [facility]:   Originally from [place]. Has also lived in [place]. Previously worked doing Software engineer. No pets currently. No bird, mold, or hot tub exposure. Does have a musty smell in their home but it is new. Enjoys reading. 1 indoor plant. Has carpet in her home including in the bedroom. Also has draperies.    Right handed   Immunization History  Administered Date(s) Administered  . Influenza, Seasonal, Injecte, Preservative Fre 04/09/2015  . Influenza,inj,Quad PF,6+ Mos 01/14/2016  . PFIZER(Purple Top)SARS-COV-2 Vaccination 06/23/2019, 07/18/2019, 04/04/2020  . Pneumococcal Conjugate-13 11/25/2015  . Pneumococcal Polysaccharide-23 11/20/2013  . Tdap 11/20/2009     Objective: Vital Signs: LMP 07/02/1994    Physical Exam   Musculoskeletal Exam: ***  CDAI Exam: CDAI Score: -- Patient Global: --; Provider Global: -- Swollen: --; Tender: -- Joint Exam 06/04/2020   No joint exam has been documented for this visit   There is currently no information documented on the homunculus. Go to the Rheumatology activity and complete the homunculus joint exam.  Investigation: No additional findings.  Imaging: No results found.  Recent Labs: Lab Results  Component Value Date   WBC 6.2 12/30/2019   HGB 13.5 12/30/2019   PLT 241 12/30/2019   NA 138 12/30/2019   K 4.5 12/30/2019   CL 106 12/30/2019   CO2 22 12/30/2019   GLUCOSE 127 (H) 12/30/2019   BUN 16 12/30/2019   CREATININE 0.99 12/30/2019   BILITOT 0.3 01/16/2020   ALKPHOS 69 01/16/2020   AST 18 01/16/2020   ALT 13  01/16/2020   PROT 6.7 01/16/2020   ALBUMIN 4.1 01/16/2020   CALCIUM 8.8 (L) 12/30/2019   GFRAA >60 12/30/2019    Speciality Comments: No specialty comments available.  Procedures:  No procedures performed Allergies: Trazodone hcl, Clindamycin/lincomycin, and Penicillins   Assessment / Plan:     Visit Diagnoses: No diagnosis found.  Orders: No orders of the defined types were placed in this encounter.  No orders of the defined types were placed in this encounter.   Face-to-face time spent with patient was *** minutes. Greater than 50% of time was spent in counseling and coordination of care.  Follow-Up Instructions: No follow-ups on file.   Earnestine Mealing, CMA  Note - This record has been created using Editor, commissioning.  Chart creation errors have been sought, but may not always  have been located. Such creation errors do not reflect on  the standard of medical care.

## 2020-05-26 ENCOUNTER — Other Ambulatory Visit: Payer: Self-pay | Admitting: *Deleted

## 2020-05-26 MED ORDER — ZOLPIDEM TARTRATE 5 MG PO TABS: ORAL_TABLET | ORAL | 0 refills | Status: DC

## 2020-05-26 NOTE — Telephone Encounter (Signed)
RX faxed from [facility]  Last Visit: 12/05/2019 Next Visit: 06/04/2020  Current Dose per office note on 12/05/2019, Ambien 5 mg p.o. nightly as needed Dx: insomnia  Okay to refill Zolpidem?

## 2020-06-03 ENCOUNTER — Ambulatory Visit: Payer: Medicare Other | Admitting: Allergy and Immunology

## 2020-06-04 ENCOUNTER — Ambulatory Visit: Payer: Medicare Other | Admitting: Rheumatology

## 2020-06-04 DIAGNOSIS — G4709 Other insomnia: Secondary | ICD-10-CM

## 2020-06-04 DIAGNOSIS — Z8669 Personal history of other diseases of the nervous system and sense organs: Secondary | ICD-10-CM

## 2020-06-04 DIAGNOSIS — Z86010 Personal history of colonic polyps: Secondary | ICD-10-CM

## 2020-06-04 DIAGNOSIS — M7542 Impingement syndrome of left shoulder: Secondary | ICD-10-CM

## 2020-06-04 DIAGNOSIS — M722 Plantar fascial fibromatosis: Secondary | ICD-10-CM

## 2020-06-04 DIAGNOSIS — M170 Bilateral primary osteoarthritis of knee: Secondary | ICD-10-CM

## 2020-06-04 DIAGNOSIS — M797 Fibromyalgia: Secondary | ICD-10-CM

## 2020-06-04 DIAGNOSIS — M47816 Spondylosis without myelopathy or radiculopathy, lumbar region: Secondary | ICD-10-CM

## 2020-06-04 DIAGNOSIS — Z8719 Personal history of other diseases of the digestive system: Secondary | ICD-10-CM

## 2020-06-04 DIAGNOSIS — Z8709 Personal history of other diseases of the respiratory system: Secondary | ICD-10-CM

## 2020-06-04 DIAGNOSIS — M19041 Primary osteoarthritis, right hand: Secondary | ICD-10-CM

## 2020-06-04 DIAGNOSIS — Z8639 Personal history of other endocrine, nutritional and metabolic disease: Secondary | ICD-10-CM

## 2020-06-04 DIAGNOSIS — M5030 Other cervical disc degeneration, unspecified cervical region: Secondary | ICD-10-CM

## 2020-06-04 DIAGNOSIS — M7062 Trochanteric bursitis, left hip: Secondary | ICD-10-CM

## 2020-06-04 DIAGNOSIS — R5383 Other fatigue: Secondary | ICD-10-CM

## 2020-06-04 DIAGNOSIS — Z8679 Personal history of other diseases of the circulatory system: Secondary | ICD-10-CM

## 2020-06-25 ENCOUNTER — Other Ambulatory Visit: Payer: Self-pay | Admitting: Physician Assistant

## 2020-06-26 NOTE — Telephone Encounter (Signed)
Last Visit: 12/05/2019 Next Visit: 08/05/2020  Current Dose per office note on 12/05/2019,  Ambien 5 mg p.o. nightly as needed Dx: Other insomnia  Last Fill:05/26/2020  Okay to refill Ambien?

## 2020-07-03 ENCOUNTER — Ambulatory Visit: Payer: Medicare Other | Admitting: Allergy and Immunology

## 2020-07-03 ENCOUNTER — Other Ambulatory Visit: Payer: Self-pay

## 2020-07-03 VITALS — BP 132/74 | HR 84 | Resp 16

## 2020-07-03 DIAGNOSIS — Z8262 Family history of osteoporosis: Secondary | ICD-10-CM

## 2020-07-03 DIAGNOSIS — K219 Gastro-esophageal reflux disease without esophagitis: Secondary | ICD-10-CM | POA: Diagnosis not present

## 2020-07-03 DIAGNOSIS — J3089 Other allergic rhinitis: Secondary | ICD-10-CM

## 2020-07-03 DIAGNOSIS — G43909 Migraine, unspecified, not intractable, without status migrainosus: Secondary | ICD-10-CM | POA: Diagnosis not present

## 2020-07-03 DIAGNOSIS — J4540 Moderate persistent asthma, uncomplicated: Secondary | ICD-10-CM | POA: Diagnosis not present

## 2020-07-03 NOTE — Progress Notes (Signed)
[facility]   Follow-up Note  Referring Provider: Wenda Low, MD Primary Provider: Wenda Low, MD Date of Office Visit: 07/03/2020  Subjective:   Christina Deleon (DOB: [birth year]) is a 70 y.o. female who returns to the [facility] on 07/03/2020 in re-evaluation of the following:  HPI: Savreen returns to this clinic in evaluation of asthma and allergic rhinitis and reflux/LPR and history of migraine headache and sleep dysfunction and a family history of osteoporosis.  Her last visit to this clinic was 11 March 2020.  Overall she feels as though she is doing very well regarding her airway at this point in time.  She has not required a systemic steroid or an antibiotic since her last visit.  She rarely uses a short acting bronchodilator averaging out about 1 time per week.  She can exert herself without any problem.  She has been using her Advair at a very low dose of about 1 inhalation or 2 inhalations 1 time per day and she intermittently uses Dymista and consistently uses montelukast.  She still has some issues with throat clearing and postnasal drip on occasion but this is better as well.  She has been using a proton pump inhibitor and H2 receptor blocker for her LPR.  She intermittently uses nystatin for a history of recurrent thrush when using her inhaled steroids.  She has seen her neurologist and her headaches are doing better.  She is scheduled to have an MRI of her brain.  She has not obtained her bone density scan to date.  She has received 3 Pfizer COVID vaccines and a flu vaccine.  Allergies as of 07/03/2020      Reactions   Trazodone Hcl Other (See Comments)   REACTION: bad headaches   Clindamycin/lincomycin Other (See Comments)   headaches   Penicillins Nausea Only, Rash   Patient has taken Amoxicillin without an issue, per patient. Has patient had a PCN reaction causing immediate rash,  facial/tongue/throat swelling, SOB or lightheadedness with hypotension: No Has patient had a PCN reaction causing severe rash involving mucus membranes or skin necrosis: No Has patient had a PCN reaction that required hospitalization No Has patient had a PCN reaction occurring within the last 10 years: No If all of the above answers are "NO", then may proceed with Cephalosporin use.      Medication List      Advair HFA 115-21 MCG/ACT inhaler Generic drug: fluticasone-salmeterol 2 inhalations 1-2 times per day   albuterol 108 (90 Base) MCG/ACT inhaler Commonly known as: ProAir HFA Inhale 2 puffs into the lungs every 6 (six) hours as needed for wheezing or shortness of breath.   ALPRAZolam 0.5 MG tablet Commonly known as: XANAX as needed.   Azelastine-Fluticasone 137-50 MCG/ACT Susp Use one spray in each nostril twice daily   CALCIUM PO Take 1 tablet by mouth daily.   candesartan 16 MG tablet Commonly known as: ATACAND Take 8 mg by mouth at bedtime.   cetirizine 10 MG tablet Commonly known as: ZYRTEC Take 1 tablet (10 mg total) by mouth daily. One tab daily for allergies   famotidine 40 MG tablet Commonly known as: Pepcid Take 1 tablet (40 mg total) by mouth at bedtime.   methocarbamol 500 MG tablet Commonly known as: ROBAXIN TAKE 1 TABLET BY MOUTH DAILY AS NEEDED FOR MUSCLE SPASMS   montelukast 10 MG tablet Commonly known as: SINGULAIR TAKE 1 TABLET(10 MG) BY MOUTH DAILY  multivitamin tablet Take 1 tablet by mouth daily. Calcium, Vitamin B-12, Vitamin A, Vitamin C   nabumetone 500 MG tablet Commonly known as: RELAFEN TAKE 1 TABLET(500 MG) BY MOUTH TWICE DAILY   nystatin 100000 UNIT/ML suspension Commonly known as: MYCOSTATIN Swish and swallow with 5 ml twice daily as directed.   pantoprazole 40 MG tablet Commonly known as: PROTONIX TAKE 1 TABLET(40 MG) BY MOUTH TWICE DAILY   polyethylene glycol powder 17 GM/SCOOP powder Commonly known as:  GLYCOLAX/MIRALAX Take 17 g by mouth daily.   rosuvastatin 20 MG tablet Commonly known as: CRESTOR Take 1 tablet (20 mg total) by mouth daily.   VITAMIN A PO Take by mouth daily.   VITAMIN B-12 PO Take 1 tablet by mouth daily.   VITAMIN C PO Take 1 tablet by mouth daily.   Vitamin D 50 MCG (2000 UT) tablet Take 2,000 Units by mouth daily.   Xiidra 5 % Soln Generic drug: Lifitegrast Apply 1 drop to eye 2 (two) times daily.   zolmitriptan 5 MG disintegrating tablet Commonly known as: ZOMIG-ZMT TAKE 1 TABLET BY MOUTH AS NEEDED FOR MIGRAINE   zolpidem 5 MG tablet Commonly known as: AMBIEN TAKE 1 TABLET(5 MG) BY MOUTH AT BEDTIME AS NEEDED       Past Medical History:  Diagnosis Date  . Adenomatous colon polyp 10/1997, and 03/2009  . Allergic rhinitis   . Allergy   . Arthritis    osteo  . Asthma   . Chronic gastritis   . Fibromyalgia   . GERD (gastroesophageal reflux disease)   . Hemorrhoids   . Hyperlipidemia   . Hypertension   . Menorrhagia    partial hysterectomy  . Migraines   . Osteoarthritis   . PONV (postoperative nausea and vomiting)   . Sleep disturbance   . 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 CATARACT EXTRACTION Bilateral 10/16 & 11/16  . EXCISION METACARPAL MASS Right 11/16/2016   Procedure: RIGHT LITTLE FINGER CYST EXCISION;  Surgeon: Milly Jakob, MD;  Location: [facility];  Service: Orthopedics;  Laterality: Right;  . TONSILLECTOMY  age 48  . tooth extract     with bone graft  . Turbinate sinus surgery  2003  . VAGINAL HYSTERECTOMY  1998   secondary to prolapse, ovaries remain    Review of systems negative except as noted in HPI / PMHx or noted below:  Review of Systems  Constitutional: Negative.   HENT: Negative.   Eyes: Negative.   Respiratory: Negative.   Cardiovascular: Negative.   Gastrointestinal: Negative.    Genitourinary: Negative.   Musculoskeletal: Negative.   Skin: Negative.   Neurological: Negative.   Endo/Heme/Allergies: Negative.   Psychiatric/Behavioral: Negative.      Objective:   Vitals:   07/03/20 1021  BP: 132/74  Pulse: 84  Resp: 16  SpO2: 98%          Physical Exam Constitutional:      Appearance: She is not diaphoretic.  HENT:     Head: Normocephalic.     Right Ear: Tympanic membrane, ear canal and external ear normal.     Left Ear: Tympanic membrane, ear canal and external ear normal.     Nose: Nose normal. No mucosal edema or rhinorrhea.     Mouth/Throat:     Mouth: Oropharynx is clear and moist and mucous membranes are normal.  Pharynx: Uvula midline. No oropharyngeal exudate.  Eyes:     Conjunctiva/sclera: Conjunctivae normal.  Neck:     Thyroid: No thyromegaly.     Trachea: Trachea normal. No tracheal tenderness or tracheal deviation.  Cardiovascular:     Rate and Rhythm: Normal rate and regular rhythm.     Heart sounds: Normal heart sounds, S1 normal and S2 normal. No murmur heard.   Pulmonary:     Effort: No respiratory distress.     Breath sounds: Normal breath sounds. No stridor. No wheezing or rales.  Musculoskeletal:        General: No edema.  Lymphadenopathy:     Head:     Right side of head: No tonsillar adenopathy.     Left side of head: No tonsillar adenopathy.     Cervical: No cervical adenopathy.  Skin:    Findings: No erythema or rash.     Nails: There is no clubbing.  Neurological:     Mental Status: She is alert.     Diagnostics:    Spirometry was performed and demonstrated an FEV1 of 1.83 at 99 % of predicted.  Assessment and Plan:   1. Asthma, moderate persistent, well-controlled   2. Other allergic rhinitis   3. LPRD (laryngopharyngeal reflux disease)   4. Migraine syndrome   5. Family history of osteoporosis     1. Continue to treat reflux/LPR:    A. Pantoprazole 40 mg twice a day  B. Famotidine 40 mg  in evening   2. Continue to treat and prevent inflammation:   A. Dymista -1 spray each nostril 2 times per day  B. montelukast 10mg  -1 time per day  C. Advair 115 HFA - 2 inhalations 1-2 times per day  3. If needed:   A. ProAir HFA  B. Zyrtec  C. Nystatin oral solution  D. nasal saline    4.  Return to clinic in 6 months or earlier if problem  5. Obtain bone density study   Sherie is really doing relatively well and she has a good understanding of her disease state and appropriate use of her medications and appropriate dosing of her medications depending on disease activity.  We will keep her on this plan and I will see her back in this clinic approximately 6 months or earlier.  I did encourage her to reschedule her bone density scan given her significant family history of osteoporosis.  Allena Katz, MD Allergy / Immunology [facility]

## 2020-07-03 NOTE — Patient Instructions (Signed)
  1. Continue to treat reflux/LPR:    A. Pantoprazole 40mg  twice a day  B. Famotidine 40 mg in evening   2. Continue to treat and prevent inflammation:   A. Dymista -1 spray each nostril 2 times per day  B. montelukast 10mg  -1 time per day  C. Advair 115 HFA - 2 inhalations 1-2 times per day  3. If needed:   A. ProAir HFA  B. Zyrtec  C. Nystatin oral solution  D. nasal saline    4.  Return to clinic in 6 months or earlier if problem  5. Obtain bone density study

## 2020-07-04 ENCOUNTER — Encounter: Payer: Self-pay | Admitting: Allergy and Immunology

## 2020-07-22 NOTE — Progress Notes (Signed)
Office Visit Note  Patient: Christina Deleon             Date of Birth: 25-Apr-1951           MRN: [id number]             PCP: Wenda Low, MD Referring: Wenda Low, MD Visit Date: 08/05/2020 Occupation: @GUAROCC @  Subjective:  Other (Neck and bilateral shoulder pain, back pain )   History of Present Illness: Christina Deleon is a 70 y.o. female the history of fibromyalgia, degenerative disc disease and osteoarthritis.  She states she has been having increased pain and discomfort in her cervical region.  She is having nocturnal pain due to discomfort.  She also has some lower back pain.  She has osteoarthritis in her hands, knee joints and her feet which causes discomfort off and on.  She continues to have some generalized pain and discomfort from fibromyalgia.  She believes her fibromyalgia symptoms have gotten worse since she had COVID-19 booster.  He has not had any recent problems with trochanteric bursitis or plantar fasciitis.  He would like to see an echo specialist.  Activities of Daily Living:  Patient reports morning stiffness for all day.   Patient Reports nocturnal pain.  Difficulty dressing/grooming: Reports Difficulty climbing stairs: Reports Difficulty getting out of chair: Reports Difficulty using hands for taps, buttons, cutlery, and/or writing: Denies  Review of Systems  Constitutional: Positive for fatigue. Negative for night sweats, weight gain and weight loss.  HENT: Positive for mouth dryness. Negative for mouth sores, trouble swallowing, trouble swallowing and nose dryness.   Eyes: Positive for dryness. Negative for pain, redness, itching and visual disturbance.  Respiratory: Positive for shortness of breath. Negative for cough and difficulty breathing.        History of asthma  Cardiovascular: Positive for palpitations. Negative for chest pain, hypertension, irregular heartbeat and swelling in legs/feet.       Followed by cardiology.  Gastrointestinal: Positive  for constipation. Negative for blood in stool and diarrhea.  Endocrine: Negative for increased urination.  Genitourinary: Negative for difficulty urinating and vaginal dryness.  Musculoskeletal: Positive for arthralgias, joint pain, joint swelling, myalgias, morning stiffness, muscle tenderness and myalgias. Negative for muscle weakness.  Skin: Negative for color change, rash, hair loss, redness, skin tightness, ulcers and sensitivity to sunlight.  Allergic/Immunologic: Negative for susceptible to infections.  Neurological: Positive for dizziness, headaches and weakness. Negative for numbness, memory loss and night sweats.  Hematological: Negative for bruising/bleeding tendency and swollen glands.  Psychiatric/Behavioral: Positive for depressed mood. Negative for confusion and sleep disturbance. The patient is not nervous/anxious.     PMFS History:  Patient Active Problem List   Diagnosis Date Noted  . Dysosmia 12/18/2019  . Cough variant asthma vs uacs  04/13/2018  . Fibromyalgia 04/19/2016  . Other fatigue 04/19/2016  . 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 Primary osteoarthritis of both knees 04/19/2016  . Leg pain 01/22/2016  . Restless legs syndrome 01/22/2016  . Snoring 11/19/2015  . Periodic limb movement sleep disorder 11/19/2015  . GERD (gastroesophageal reflux disease) 10/08/2015  . Mild persistent asthma 04/09/2015  . LPRD (laryngopharyngeal reflux disease) 04/09/2015  . Allergic rhinoconjunctivitis 04/09/2015  . HEMATOCHEZIA 04/01/2010  . COUGH 04/01/2010  . GASTRITIS 09/30/2009  . NAUSEA 09/30/2009  . ABDOMINAL PAIN-EPIGASTRIC 09/30/2009  . DYSTHYMIC DISORDER 09/26/2009  . PALPITATIONS 06/17/2009  . Impingement syndrome of shoulder, left 06/04/2009  . CONSTIPATION 02/05/2009  . PERSONAL HX COLONIC POLYPS 02/05/2009  .  ASTHMA, PERSISTENT, MILD 02/04/2009  . CHEST PAIN, ATYPICAL 02/04/2009  . COLONIC POLYPS, BENIGN 12/18/2008  . Hyperlipidemia LDL goal <70 12/18/2008  .  Insomnia 12/18/2008  . MIGRAINE, COMMON 12/18/2008  . Essential hypertension 12/18/2008  . ALLERGIC RHINITIS 12/18/2008  . Primary osteoarthritis of both hands 12/18/2008  . DJD (degenerative joint disease), cervical 12/18/2008  . NECK PAIN, CHRONIC 12/18/2008  . LOW BACK PAIN, CHRONIC 12/18/2008    Past Medical History:  Diagnosis Date  . Adenomatous colon polyp 10/1997, and 03/2009  . Allergic rhinitis   . Allergy   . Arthritis    osteo  . Asthma   . Chronic gastritis   . Fibromyalgia   . GERD (gastroesophageal reflux disease)   . Hemorrhoids   . Hyperlipidemia   . Hypertension   . Menorrhagia    partial hysterectomy  . Migraines   . Osteoarthritis   . PONV (postoperative nausea and vomiting)   . Sleep disturbance   . STD (sexually transmitted disease) 01/22/11 culture proven   HSV type I labia    Family History  Problem Relation Age of Onset  . Throat cancer Father   . Coronary artery disease Father   . Heart attack Father 16  . Stroke Father        during procedure  . Coronary artery disease Mother   . Hypertension Mother   . Hyperlipidemia Mother   . Pulmonary fibrosis Mother   . Lung disease Mother   . Prostate cancer Brother   . Heart disease Brother   . Diabetes Brother   . Diabetes Paternal Aunt        x 2  . Diabetes Paternal Grandmother   . Coronary artery disease Paternal Grandmother   . Stroke Maternal Grandmother        or MI  . Prostate cancer Paternal Uncle   . Migraines Daughter   . Migraines Daughter   . Colon cancer Neg Hx    Past Surgical History:  Procedure Laterality Date  . CATARACT EXTRACTION Bilateral 10/16 & 11/16  . EXCISION METACARPAL MASS Right 11/16/2016   Procedure: RIGHT LITTLE FINGER CYST EXCISION;  Surgeon: Milly Jakob, MD;  Location: [facility];  Service: Orthopedics;  Laterality: Right;  . TONSILLECTOMY  age 84  . tooth extract     with bone graft  . Turbinate sinus surgery  2003  . VAGINAL  HYSTERECTOMY  1998   secondary to prolapse, ovaries remain   Social History   Social History Narrative   She lives with husband.  They have 2 grown children.   She is retired Product/process development scientist.   Highest level of education:  2 years of college   Right handed   Regular exercise, diet of fruits, veggies, limited fried foods, limited water      [facility]:   Originally from [place]. Has also lived in [place]. Previously worked doing Software engineer. No pets currently. No bird, mold, or hot tub exposure. Does have a musty smell in their home but it is new. Enjoys reading. 1 indoor plant. Has carpet in her home including in the bedroom. Also has draperies.    Right handed   Immunization History  Administered Date(s) Administered  . Influenza, Seasonal, Injecte, Preservative Fre 04/09/2015  . Influenza,inj,Quad PF,6+ Mos 01/14/2016  . PFIZER(Purple Top)SARS-COV-2 Vaccination 06/23/2019, 07/18/2019, 04/04/2020  . Pneumococcal Conjugate-13 11/25/2015  . Pneumococcal Polysaccharide-23 11/20/2013  . Tdap 11/20/2009     Objective: Vital Signs: BP  135/84 (BP Location: Left Arm, Patient Position: Sitting, Cuff Size: Normal)   Pulse 93   Resp 14   Ht 5\' 4"  (1.626 m)   Wt 155 lb 11.2 oz (70.6 kg)   LMP 07/02/1994   BMI 26.73 kg/m    Physical Exam Vitals and nursing note reviewed.  Constitutional:      Appearance: She is well-developed.  HENT:     Head: Normocephalic and atraumatic.  Eyes:     Conjunctiva/sclera: Conjunctivae normal.  Cardiovascular:     Rate and Rhythm: Normal rate and regular rhythm.     Heart sounds: Normal heart sounds.  Pulmonary:     Effort: Pulmonary effort is normal.     Breath sounds: Normal breath sounds.  Abdominal:     General: Bowel sounds are normal.     Palpations: Abdomen is soft.  Musculoskeletal:     Cervical back: Normal range of motion.  Lymphadenopathy:     Cervical: No cervical adenopathy.  Skin:    General:  Skin is warm and dry.     Capillary Refill: Capillary refill takes less than 2 seconds.  Neurological:     Mental Status: She is alert and oriented to person, place, and time.  Psychiatric:        Behavior: Behavior normal.      Musculoskeletal Exam: She had limited painful range of motion of the cervical spine.  She had discomfort range of motion for lumbar spine.  Shoulder joints.  Good range of motion with some discomfort.  Elbow joints, wrist joints with good range of motion.  She had bilateral PIP and DIP thickening with no synovitis.  Hip joints, knee joints, ankles with good range of motion.  She had no tenderness over MTPs.  She has generalized hyperalgesia and positive tender points.  She had tenderness on palpation over left trochanteric bursa.  CDAI Exam: CDAI Score: -- Patient Global: --; Provider Global: -- Swollen: --; Tender: -- Joint Exam 08/05/2020   No joint exam has been documented for this visit   There is currently no information documented on the homunculus. Go to the Rheumatology activity and complete the homunculus joint exam.  Investigation: No additional findings.  Imaging: No results found.  Recent Labs: Lab Results  Component Value Date   WBC 6.2 12/30/2019   HGB 13.5 12/30/2019   PLT 241 12/30/2019   NA 138 12/30/2019   K 4.5 12/30/2019   CL 106 12/30/2019   CO2 22 12/30/2019   GLUCOSE 127 (H) 12/30/2019   BUN 16 12/30/2019   CREATININE 0.99 12/30/2019   BILITOT 0.3 01/16/2020   ALKPHOS 69 01/16/2020   AST 18 01/16/2020   ALT 13 01/16/2020   PROT 6.7 01/16/2020   ALBUMIN 4.1 01/16/2020   CALCIUM 8.8 (L) 12/30/2019   GFRAA >60 12/30/2019    Speciality Comments: No specialty comments available.  Procedures:  No procedures performed Allergies: Trazodone hcl, Clindamycin/lincomycin, and Penicillins   Assessment / Plan:     Visit Diagnoses: Fibromyalgia-she continues to have generalized pain and discomfort from fibromyalgia.  She has  been taking methocarbamol in the morning which helps her.  Have advised her to cut back if possible.  She states she is unable to function without methocarbamol.  She also takes nabumetone on as needed basis for fibromyalgia and osteoarthritis.  Other fatigue-related to fibromyalgia and insomnia.  Other insomnia -she is on Ambien 5 mg p.o. nightly as needed.  Patient states that she has to take it every  night otherwise she cannot sleep.  DDD (degenerative disc disease), cervical-I reviewed her x-rays from August 2020 which showed degenerative changes.  She has been having increased pain and discomfort.  She would like to see a neck specialist.  I will refer her to orthopedics.  I offered referral to physical therapy but she would like to wait until her evaluation by orthopedics.  DDD lumbar spine-she continues to have some lower back pain.  I do not have any recent x-rays of her back.  Chronic pain of both shoulders - History of left shoulder impingement.  She has been having increased shoulder joint discomfort.  She may address this further with the orthopedic surgeon as well.  Primary osteoarthritis of both hands-she complains of increased pain and swelling in her hands.  No synovitis was noted.  She had bilateral PIP and DIP thickening consistent with osteoarthritis.  Joint protection muscle strengthening was discussed.  Trochanteric bursitis, left hip-she had tenderness over left trochanteric bursa.  IT band stretches were given.  Primary osteoarthritis of both knees-she is currently not having much discomfort.  Plantar fasciitis, right-she has history of intermittent pain with doing better currently  Medication management -she is taking long-term NSAIDs.  Side effects of long-term NSAIDs was discussed.  I will check labs today.  Plan: CBC with Differential/Platelet, COMPLETE METABOLIC PANEL WITH GFR  History of hypertension-her blood pressure is within normal limits.  History of  asthma  History of gastroesophageal reflux (GERD)-increased reflux with NSAIDs was also discussed.  History of migraine  History of hyperlipidemia  History of colon polyps  Orders: Orders Placed This Encounter  Procedures  . CBC with Differential/Platelet  . COMPLETE METABOLIC PANEL WITH GFR   No orders of the defined types were placed in this encounter.   Follow-Up Instructions: Return in about 6 months (around 02/04/2021) for Osteoarthritis.   Bo Merino, MD  Note - This record has been created using Editor, commissioning.  Chart creation errors have been sought, but may not always  have been located. Such creation errors do not reflect on  the standard of medical care.

## 2020-07-30 ENCOUNTER — Other Ambulatory Visit: Payer: Self-pay | Admitting: Rheumatology

## 2020-07-30 NOTE — Telephone Encounter (Signed)
Next Visit: 08/05/2020  Last Visit:12/05/2019  Last Fill: 06/26/2020  Dx: Other insomnia  Current Dose per office note on 12/05/2019, Ambien 5 mg p.o. nightly as needed  Okay to refill Ambien?

## 2020-08-05 ENCOUNTER — Ambulatory Visit: Payer: Medicare Other | Admitting: Rheumatology

## 2020-08-05 ENCOUNTER — Other Ambulatory Visit: Payer: Self-pay

## 2020-08-05 ENCOUNTER — Encounter: Payer: Self-pay | Admitting: Rheumatology

## 2020-08-05 VITALS — BP 135/84 | HR 93 | Resp 14 | Ht 64.0 in | Wt 155.7 lb

## 2020-08-05 DIAGNOSIS — M7542 Impingement syndrome of left shoulder: Secondary | ICD-10-CM

## 2020-08-05 DIAGNOSIS — M5030 Other cervical disc degeneration, unspecified cervical region: Secondary | ICD-10-CM

## 2020-08-05 DIAGNOSIS — M797 Fibromyalgia: Secondary | ICD-10-CM

## 2020-08-05 DIAGNOSIS — M19041 Primary osteoarthritis, right hand: Secondary | ICD-10-CM

## 2020-08-05 DIAGNOSIS — M19042 Primary osteoarthritis, left hand: Secondary | ICD-10-CM

## 2020-08-05 DIAGNOSIS — Z8669 Personal history of other diseases of the nervous system and sense organs: Secondary | ICD-10-CM

## 2020-08-05 DIAGNOSIS — R5383 Other fatigue: Secondary | ICD-10-CM

## 2020-08-05 DIAGNOSIS — M47816 Spondylosis without myelopathy or radiculopathy, lumbar region: Secondary | ICD-10-CM

## 2020-08-05 DIAGNOSIS — M7062 Trochanteric bursitis, left hip: Secondary | ICD-10-CM

## 2020-08-05 DIAGNOSIS — G4709 Other insomnia: Secondary | ICD-10-CM

## 2020-08-05 DIAGNOSIS — Z860100 Personal history of colon polyps, unspecified: Secondary | ICD-10-CM

## 2020-08-05 DIAGNOSIS — M722 Plantar fascial fibromatosis: Secondary | ICD-10-CM

## 2020-08-05 DIAGNOSIS — M170 Bilateral primary osteoarthritis of knee: Secondary | ICD-10-CM

## 2020-08-05 DIAGNOSIS — Z8639 Personal history of other endocrine, nutritional and metabolic disease: Secondary | ICD-10-CM

## 2020-08-05 DIAGNOSIS — M25512 Pain in left shoulder: Secondary | ICD-10-CM

## 2020-08-05 DIAGNOSIS — Z8679 Personal history of other diseases of the circulatory system: Secondary | ICD-10-CM

## 2020-08-05 DIAGNOSIS — M25511 Pain in right shoulder: Secondary | ICD-10-CM

## 2020-08-05 DIAGNOSIS — Z8709 Personal history of other diseases of the respiratory system: Secondary | ICD-10-CM

## 2020-08-05 DIAGNOSIS — Z79899 Other long term (current) drug therapy: Secondary | ICD-10-CM

## 2020-08-05 DIAGNOSIS — G8929 Other chronic pain: Secondary | ICD-10-CM

## 2020-08-05 DIAGNOSIS — Z8719 Personal history of other diseases of the digestive system: Secondary | ICD-10-CM

## 2020-08-05 NOTE — Patient Instructions (Signed)
Iliotibial Band Syndrome Rehab Ask your health care provider which exercises are safe for you. Do exercises exactly as told by your health care provider and adjust them as directed. It is normal to feel mild stretching, pulling, tightness, or discomfort as you do these exercises. Stop right away if you feel sudden pain or your pain gets significantly worse. Do not begin these exercises until told by your health care provider. Stretching and range-of-motion exercises These exercises warm up your muscles and joints and improve the movement and flexibility of your hip and pelvis. Quadriceps stretch, prone 1. Lie on your abdomen (prone position) on a firm surface, such as a bed or padded floor. 2. Bend your left / right knee and reach back to hold your ankle or pant leg. If you cannot reach your ankle or pant leg, loop a belt around your foot and grab the belt instead. 3. Gently pull your heel toward your buttocks. Your knee should not slide out to the side. You should feel a stretch in the front of your thigh and knee (quadriceps). 4. Hold this position for __________ seconds. Repeat __________ times. Complete this exercise __________ times a day.   Iliotibial band stretch An iliotibial band is a strong band of muscle tissue that runs from the outer side of your hip to the outer side of your thigh and knee. 1. Lie on your side with your left / right leg in the top position. 2. Bend both of your knees and grab your left / right ankle. Stretch out your bottom arm to help you balance. 3. Slowly bring your top knee back so your thigh goes behind your trunk. 4. Slowly lower your top leg toward the floor until you feel a gentle stretch on the outside of your left / right hip and thigh. If you do not feel a stretch and your knee will not fall farther, place the heel of your other foot on top of your knee and pull your knee down toward the floor with your foot. 5. Hold this position for __________  seconds. Repeat __________ times. Complete this exercise __________ times a day.   Strengthening exercises These exercises build strength and endurance in your hip and pelvis. Endurance is the ability to use your muscles for a long time, even after they get tired. Straight leg raises, side-lying This exercise strengthens the muscles that rotate the leg at the hip and move it away from your body (hip abductors). 1. Lie on your side with your left / right leg in the top position. Lie so your head, shoulder, hip, and knee line up. You may bend your bottom knee to help you balance. 2. Roll your hips slightly forward so your hips are stacked directly over each other and your left / right knee is facing forward. 3. Tense the muscles in your outer thigh and lift your top leg 4-6 inches (10-15 cm). 4. Hold this position for __________ seconds. 5. Slowly lower your leg to return to the starting position. Let your muscles relax completely before doing another repetition. Repeat __________ times. Complete this exercise __________ times a day.   Leg raises, prone This exercise strengthens the muscles that move the hips backward (hip extensors). 1. Lie on your abdomen (prone position) on your bed or a firm surface. You can put a pillow under your hips if that is more comfortable for your lower back. 2. Bend your left / right knee so your foot is straight up in the air.   3. Squeeze your buttocks muscles and lift your left / right thigh off the bed. Do not let your back arch. 4. Tense your thigh muscle as hard as you can without increasing any knee pain. 5. Hold this position for __________ seconds. 6. Slowly lower your leg to return to the starting position and allow it to relax completely. Repeat __________ times. Complete this exercise __________ times a day. Hip hike 1. Stand sideways on a bottom step. Stand on your left / right leg with your other foot unsupported next to the step. You can hold on to a  railing or wall for balance if needed. 2. Keep your knees straight and your torso square. Then lift your left / right hip up toward the ceiling. 3. Slowly let your left / right hip lower toward the floor, past the starting position. Your foot should get closer to the floor. Do not lean or bend your knees. Repeat __________ times. Complete this exercise __________ times a day. This information is not intended to replace advice given to you by your health care provider. Make sure you discuss any questions you have with your health care provider. Document Revised: 06/27/2019 Document Reviewed: 06/27/2019 Elsevier Patient Education  2021 [employer]. Cervical Strain and Sprain Rehab Ask your health care provider which exercises are safe for you. Do exercises exactly as told by your health care provider and adjust them as directed. It is normal to feel mild stretching, pulling, tightness, or discomfort as you do these exercises. Stop right away if you feel sudden pain or your pain gets worse. Do not begin these exercises until told by your health care provider. Stretching and range-of-motion exercises Cervical side bending 5. Using good posture, sit on a stable chair or stand up. 6. Without moving your shoulders, slowly tilt your left / right ear to your shoulder until you feel a stretch in the opposite side neck muscles. You should be looking straight ahead. 7. Hold for __________ seconds. 8. Repeat with the other side of your neck. Repeat __________ times. Complete this exercise __________ times a day.   Cervical rotation 6. Using good posture, sit on a stable chair or stand up. 7. Slowly turn your head to the side as if you are looking over your left / right shoulder. ? Keep your eyes level with the ground. ? Stop when you feel a stretch along the side and the back of your neck. 8. Hold for __________ seconds. 9. Repeat this by turning to your other side. Repeat __________ times. Complete this  exercise __________ times a day.   Thoracic extension and pectoral stretch 6. Roll a towel or a small blanket so it is about 4 inches (10 cm) in diameter. 7. Lie down on your back on a firm surface. 8. Put the towel lengthwise, under your spine in the middle of your back. It should not be under your shoulder blades. The towel should line up with your spine from your middle back to your lower back. 9. Put your hands behind your head and let your elbows fall out to your sides. 10. Hold for __________ seconds. Repeat __________ times. Complete this exercise __________ times a day. Strengthening exercises Isometric upper cervical flexion 7. Lie on your back with a thin pillow behind your head and a small rolled-up towel under your neck. 8. Gently tuck your chin toward your chest and nod your head down to look toward your feet. Do not lift your head off the pillow. 9.  Hold for __________ seconds. 10. Release the tension slowly. Relax your neck muscles completely before you repeat this exercise. Repeat __________ times. Complete this exercise __________ times a day. Isometric cervical extension 4. Stand about 6 inches (15 cm) away from a wall, with your back facing the wall. 5. Place a soft object, about 6-8 inches (15-20 cm) in diameter, between the back of your head and the wall. A soft object could be a small pillow, a ball, or a folded towel. 6. Gently tilt your head back and press into the soft object. Keep your jaw and forehead relaxed. 7. Hold for __________ seconds. 8. Release the tension slowly. Relax your neck muscles completely before you repeat this exercise. Repeat __________ times. Complete this exercise __________ times a day.   Posture and body mechanics Body mechanics refers to the movements and positions of your body while you do your daily activities. Posture is part of body mechanics. Good posture and healthy body mechanics can help to relieve stress in your body's tissues and  joints. Good posture means that your spine is in its natural S-curve position (your spine is neutral), your shoulders are pulled back slightly, and your head is not tipped forward. The following are general guidelines for applying improved posture and body mechanics to your everyday activities. Sitting 1. When sitting, keep your spine neutral and keep your feet flat on the floor. Use a footrest, if necessary, and keep your thighs parallel to the floor. Avoid rounding your shoulders, and avoid tilting your head forward. 2. When working at a desk or a computer, keep your desk at a height where your hands are slightly lower than your elbows. Slide your chair under your desk so you are close enough to maintain good posture. 3. When working at a computer, place your monitor at a height where you are looking straight ahead and you do not have to tilt your head forward or downward to look at the screen.   Standing  When standing, keep your spine neutral and keep your feet about hip-width apart. Keep a slight bend in your knees. Your ears, shoulders, and hips should line up.  When you do a task in which you stand in one place for a long time, place one foot up on a stable object that is 2-4 inches (5-10 cm) high, such as a footstool. This helps keep your spine neutral.   Resting When lying down and resting, avoid positions that are most painful for you. Try to support your neck in a neutral position. You can use a contour pillow or a small rolled-up towel. Your pillow should support your neck but not push on it. This information is not intended to replace advice given to you by your health care provider. Make sure you discuss any questions you have with your health care provider. Document Revised: 08/09/2018 Document Reviewed: 01/18/2018 Elsevier Patient Education  2021 [employer].

## 2020-08-05 NOTE — Addendum Note (Signed)
Addended by: Earnestine Mealing on: 08/05/2020 10:16 AM   Modules accepted: Orders

## 2020-08-06 LAB — CBC WITH DIFFERENTIAL/PLATELET
Absolute Monocytes: 527 {cells}/uL (ref 200–950)
Basophils Absolute: 32 {cells}/uL (ref 0–200)
Basophils Relative: 0.7 %
Eosinophils Absolute: 99 {cells}/uL (ref 15–500)
Eosinophils Relative: 2.2 %
HCT: 40.1 % (ref 35.0–45.0)
Hemoglobin: 13.2 g/dL (ref 11.7–15.5)
Lymphs Abs: 1350 {cells}/uL (ref 850–3900)
MCH: 29.6 pg (ref 27.0–33.0)
MCHC: 32.9 g/dL (ref 32.0–36.0)
MCV: 89.9 fL (ref 80.0–100.0)
MPV: 9.5 fL (ref 7.5–12.5)
Monocytes Relative: 11.7 %
Neutro Abs: 2493 {cells}/uL (ref 1500–7800)
Neutrophils Relative %: 55.4 %
Platelets: 344 10*3/uL (ref 140–400)
RBC: 4.46 Million/uL (ref 3.80–5.10)
RDW: 12.6 % (ref 11.0–15.0)
Total Lymphocyte: 30 %
WBC: 4.5 10*3/uL (ref 3.8–10.8)

## 2020-08-06 LAB — COMPLETE METABOLIC PANEL WITHOUT GFR
AG Ratio: 1.5 (calc) (ref 1.0–2.5)
ALT: 17 U/L (ref 6–29)
AST: 18 U/L (ref 10–35)
Albumin: 3.8 g/dL (ref 3.6–5.1)
Alkaline phosphatase (APISO): 63 U/L (ref 37–153)
BUN: 15 mg/dL (ref 7–25)
CO2: 30 mmol/L (ref 20–32)
Calcium: 9.2 mg/dL (ref 8.6–10.4)
Chloride: 105 mmol/L (ref 98–110)
Creat: 0.8 mg/dL (ref 0.60–0.93)
GFR, Est African American: 87 mL/min/{1.73_m2}
GFR, Est Non African American: 75 mL/min/{1.73_m2}
Globulin: 2.6 g/dL (ref 1.9–3.7)
Glucose, Bld: 78 mg/dL (ref 65–99)
Potassium: 4.7 mmol/L (ref 3.5–5.3)
Sodium: 143 mmol/L (ref 135–146)
Total Bilirubin: 0.4 mg/dL (ref 0.2–1.2)
Total Protein: 6.4 g/dL (ref 6.1–8.1)

## 2020-08-06 LAB — SPECIMEN COMPROMISED

## 2020-08-06 NOTE — Progress Notes (Signed)
CBC normal, CMP normal.

## 2020-08-13 ENCOUNTER — Other Ambulatory Visit: Payer: Self-pay | Admitting: Allergy and Immunology

## 2020-08-19 ENCOUNTER — Ambulatory Visit: Payer: Medicare Other | Admitting: Gastroenterology

## 2020-08-19 ENCOUNTER — Other Ambulatory Visit: Payer: Self-pay

## 2020-08-19 ENCOUNTER — Encounter: Payer: Self-pay | Admitting: Gastroenterology

## 2020-08-19 VITALS — BP 130/70 | HR 84 | Ht 64.0 in | Wt 154.8 lb

## 2020-08-19 DIAGNOSIS — Z86010 Personal history of colonic polyps: Secondary | ICD-10-CM

## 2020-08-19 DIAGNOSIS — R1084 Generalized abdominal pain: Secondary | ICD-10-CM

## 2020-08-19 DIAGNOSIS — K5900 Constipation, unspecified: Secondary | ICD-10-CM | POA: Diagnosis not present

## 2020-08-19 MED ORDER — DICYCLOMINE HCL 10 MG PO CAPS: 10.0000 mg | ORAL_CAPSULE | Freq: Three times a day (TID) | ORAL | 11 refills | Status: DC

## 2020-08-19 MED ORDER — NA SULFATE-K SULFATE-MG SULF 17.5-3.13-1.6 GM/177ML PO SOLN
1.0000 | Freq: Once | ORAL | 0 refills | Status: AC
Start: 2020-08-19 — End: 2020-08-19

## 2020-08-19 NOTE — Patient Instructions (Signed)
We have sent the following medications to your pharmacy for you to pick up at your convenience: dicyclomine.   Please start over the counter magnesium oxide 400 mg 2 tablets daily.  You have been scheduled for a colonoscopy. Please follow written instructions given to you at your visit today.  Please pick up your prep supplies at the pharmacy within the next 1-3 days. If you use inhalers (even only as needed), please bring them with you on the day of your procedure.  Normal BMI (Body Mass Index- based on height and weight) is between 19 and 25. Your BMI today is Body mass index is 26.57 kg/m. Marland Kitchen Please consider follow up  regarding your BMI with your Primary Care Provider.   Thank you for choosing me and [facility].  Pricilla Riffle. Dagoberto Ligas., MD., Marval Regal

## 2020-08-19 NOTE — Progress Notes (Signed)
    History of Present Illness: This is a 70 year old female with GERD with LPR who has abdominal pain after meals with ongoing constipation and occasional nausea, vomiting.  CBC, CMP normal 2 weeks ago.  She relates worsening constipation over the past couple years.  Over the past few months she has noted generalized postprandial abdominal pain brought on by most foods.  Clearly milk products worsen her symptoms.  When the pain has been more severe she has had nausea and vomiting.  She states she has tried MiraLAX which led to abdominal cramping.  Milk of magnesia also leads to cramping.  Her last colonoscopy was performed in November 2015 showing internal hemorrhoids.  Current Medications, Allergies, Past Medical History, Past Surgical History, Family History and Social History were reviewed in [employer] record.   Physical Exam: General: Well developed, well nourished, no acute distress Head: Normocephalic and atraumatic Eyes: Sclerae anicteric, EOMI Ears: Normal auditory acuity Mouth: Not examined, mask on during Covid-19 pandemic Lungs: Clear throughout to auscultation Heart: Regular rate and rhythm; no murmurs, rubs or bruits Abdomen: Soft, non tender and non distended. No masses, hepatosplenomegaly or hernias noted. Normal Bowel sounds Rectal: Not done Musculoskeletal: Symmetrical with no gross deformities  Pulses:  Normal pulses noted Extremities: No clubbing, cyanosis, edema or deformities noted Neurological: Alert oriented x 4, grossly nonfocal Psychological:  Alert and cooperative. Normal mood and affect   Assessment and Recommendations:  1.  Postprandial abdominal pain with constipation.  Begin dicyclomine 10 mg p.o. 3 times daily AC.  Begin magnesium oxide 800 mg daily.  Discontinue lactose products.  Avoid other foods and beverages that exacerbate symptoms.   2.  Personal history of adenomatous colon polyps.  Schedule colonoscopy. The risks (including  bleeding, perforation, infection, missed lesions, medication reactions and possible hospitalization or surgery if complications occur), benefits, and alternatives to colonoscopy with possible biopsy and possible polypectomy were discussed with the patient and they consent to proceed.   3.  Throat clearing, globus sensation, hoarseness.  GERD with LPR.  Upper airway cough syndrome, irritable larynx syndrome, asthma, allergies.  She is advised to closely follow antireflux measures.  Continue omeprazole 40 mg twice daily and continue famotidine 40 mg at bedtime. She is advised to schedule ENT follow-up with Dr. Constance Holster.

## 2020-08-25 ENCOUNTER — Other Ambulatory Visit: Payer: Self-pay | Admitting: Rheumatology

## 2020-08-25 NOTE — Telephone Encounter (Signed)
Last Visit: 08/05/2020 Next Visit: 02/03/2021  Last Fill: 07/30/2020  Okay to refill Lorrin Mais?

## 2020-09-17 ENCOUNTER — Other Ambulatory Visit: Payer: Self-pay

## 2020-09-17 ENCOUNTER — Ambulatory Visit (AMBULATORY_SURGERY_CENTER): Payer: Medicare Other | Admitting: Gastroenterology

## 2020-09-17 ENCOUNTER — Encounter: Payer: Self-pay | Admitting: Gastroenterology

## 2020-09-17 VITALS — BP 109/56 | HR 75 | Temp 98.4°F | Resp 20 | Ht 64.0 in | Wt 154.0 lb

## 2020-09-17 DIAGNOSIS — R194 Change in bowel habit: Secondary | ICD-10-CM

## 2020-09-17 DIAGNOSIS — D124 Benign neoplasm of descending colon: Secondary | ICD-10-CM | POA: Diagnosis not present

## 2020-09-17 DIAGNOSIS — K5730 Diverticulosis of large intestine without perforation or abscess without bleeding: Secondary | ICD-10-CM | POA: Diagnosis not present

## 2020-09-17 DIAGNOSIS — D122 Benign neoplasm of ascending colon: Secondary | ICD-10-CM

## 2020-09-17 DIAGNOSIS — K5900 Constipation, unspecified: Secondary | ICD-10-CM

## 2020-09-17 DIAGNOSIS — K640 First degree hemorrhoids: Secondary | ICD-10-CM

## 2020-09-17 DIAGNOSIS — Z86010 Personal history of colonic polyps: Secondary | ICD-10-CM

## 2020-09-17 MED ORDER — SODIUM CHLORIDE 0.9 % IV SOLN: 500.0000 mL | Freq: Once | INTRAVENOUS | Status: DC

## 2020-09-17 NOTE — Op Note (Signed)
[facility] Patient Name: Christina Deleon Procedure Date: 09/17/2020 7:58 AM MRN: [id number] Endoscopist: Ladene Artist , MD Age: 70 Referring MD:  Date of Birth: 1951-03-24 Gender: Female Account #: [id number] Procedure:                Colonoscopy Indications:              Surveillance: Personal history of adenomatous                            polyps on last colonoscopy > 5 years ago. Change in                            bowel habits. Constipation. Medicines:                Monitored Anesthesia Care Procedure:                Pre-Anesthesia Assessment:                           - Prior to the procedure, a History and Physical                            was performed, and patient medications and                            allergies were reviewed. The patient's tolerance of                            previous anesthesia was also reviewed. The risks                            and benefits of the procedure and the sedation                            options and risks were discussed with the patient.                            All questions were answered, and informed consent                            was obtained. Prior Anticoagulants: The patient has                            taken no previous anticoagulant or antiplatelet                            agents. ASA Grade Assessment: II - A patient with                            mild systemic disease. After reviewing the risks                            and benefits, the patient was deemed in  satisfactory condition to undergo the procedure.                           After obtaining informed consent, the colonoscope                            was passed under direct vision. Throughout the                            procedure, the patient's blood pressure, pulse, and                            oxygen saturations were monitored continuously. The                            Olympus [id number]) [id number]  was introduced                            through the anus and advanced to the the cecum,                            identified by appendiceal orifice and ileocecal                            valve. The ileocecal valve, appendiceal orifice,                            and rectum were photographed. The quality of the                            bowel preparation was good. The colonoscopy was                            performed without difficulty. The patient tolerated                            the procedure well. Scope In: 8:03:52 AM Scope Out: 8:21:46 AM Scope Withdrawal Time: 0 hours 13 minutes 47 seconds  Total Procedure Duration: 0 hours 17 minutes 54 seconds  Findings:                 The perianal and digital rectal examinations were                            normal.                           A 12 mm polyp was found in the ascending colon. The                            polyp was sessile. The polyp was removed with a                            cold snare. Resection and retrieval were complete.  Three sessile polyps were found in the descending                            colon and ascending colon. The polyps were 5 to 6                            mm in size. These polyps were removed with a cold                            snare. Resection and retrieval were complete.                           A few small-mouthed diverticula were found in the                            sigmoid colon.                           Internal hemorrhoids were found during                            retroflexion. The hemorrhoids were moderate and                            Grade I (internal hemorrhoids that do not prolapse).                           The exam was otherwise without abnormality on                            direct and retroflexion views. Complications:            No immediate complications. Estimated blood loss:                            None. Estimated Blood Loss:      Estimated blood loss: none. Impression:               - One 12 mm polyp in the ascending colon, removed                            with a cold snare. Resected and retrieved.                           - Three 5 to 6 mm polyps in the descending colon                            and in the ascending colon, removed with a cold                            snare. Resected and retrieved.                           - Mild diverticulosis in the sigmoid colon.                           -  Internal hemorrhoids.                           - The examination was otherwise normal on direct                            and retroflexion views. Recommendation:           - Repeat colonoscopy, likely 3 years, after studies                            are complete for surveillance based on pathology                            results.                           - Patient has a contact number available for                            emergencies. The signs and symptoms of potential                            delayed complications were discussed with the                            patient. Return to normal activities tomorrow.                            Written discharge instructions were provided to the                            patient.                           - Resume previous diet.                           - Continue present medications.                           - Await pathology results. Ladene Artist, MD 09/17/2020 8:26:23 AM This report has been signed electronically.

## 2020-09-17 NOTE — Progress Notes (Signed)
C.W. vital signs. 

## 2020-09-17 NOTE — Progress Notes (Signed)
Pt Drowsy. VSS. To PACU, report to RN. No anesthetic complications noted.  

## 2020-09-17 NOTE — Progress Notes (Signed)
Pt's states no medical or surgical changes since previsit or office visit. 

## 2020-09-17 NOTE — Patient Instructions (Signed)
YOU HAD AN ENDOSCOPIC PROCEDURE TODAY AT [facility]:   Refer to the procedure report that was given to you for any specific questions about what was found during the examination.  If the procedure report does not answer your questions, please call your gastroenterologist to clarify.  If you requested that your care partner not be given the details of your procedure findings, then the procedure report has been included in a sealed envelope for you to review at your convenience later.  YOU SHOULD EXPECT: Some feelings of bloating in the abdomen. Passage of more gas than usual.  Walking can help get rid of the air that was put into your GI tract during the procedure and reduce the bloating. If you had a lower endoscopy (such as a colonoscopy or flexible sigmoidoscopy) you may notice spotting of blood in your stool or on the toilet paper. If you underwent a bowel prep for your procedure, you may not have a normal bowel movement for a few days.  Please Note:  You might notice some irritation and congestion in your nose or some drainage.  This is from the oxygen used during your procedure.  There is no need for concern and it should clear up in a day or so.  SYMPTOMS TO REPORT IMMEDIATELY:   Following lower endoscopy (colonoscopy or flexible sigmoidoscopy):  Excessive amounts of blood in the stool  Significant tenderness or worsening of abdominal pains  Swelling of the abdomen that is new, acute  Fever of 100F or higher   For urgent or emergent issues, a gastroenterologist can be reached at any hour by calling [phone]. Do not use MyChart messaging for urgent concerns.    DIET:  We do recommend a small meal at first, but then you may proceed to your regular diet.  Drink plenty of fluids but you should avoid alcoholic beverages for 24 hours. Try to eat a high fiber diet, and drink plenty of water. ACTIVITY:  You should plan to take it easy for the rest of today and you should  NOT DRIVE or use heavy machinery until tomorrow (because of the sedation medicines used during the test).    FOLLOW UP: Our staff will call the number listed on your records 48-72 hours following your procedure to check on you and address any questions or concerns that you may have regarding the information given to you following your procedure. If we do not reach you, we will leave a message.  We will attempt to reach you two times.  During this call, we will ask if you have developed any symptoms of COVID 19. If you develop any symptoms (ie: fever, flu-like symptoms, shortness of breath, cough etc.) before then, please call [phone].  If you test positive for Covid 19 in the 2 weeks post procedure, please call and report this information to [place].    If any biopsies were taken you will be contacted by phone or by letter within the next 1-3 weeks.  Please call us at [phone] if you have not heard about the biopsies in 3 weeks.    SIGNATURES/CONFIDENTIALITY: You and/or your care partner have signed paperwork which will be entered into your electronic medical record.  These signatures attest to the fact that that the information above on your After Visit Summary has been reviewed and is understood.  Full responsibility of the confidentiality of this discharge information lies with you and/or your care-partner.

## 2020-09-18 ENCOUNTER — Other Ambulatory Visit: Payer: Self-pay | Admitting: Internal Medicine

## 2020-09-18 ENCOUNTER — Ambulatory Visit
Admission: RE | Admit: 2020-09-18 | Discharge: 2020-09-18 | Disposition: A | Discharge status: Home / Self Care | Payer: Medicare Other | Source: Ambulatory Visit | Attending: Internal Medicine | Admitting: Internal Medicine

## 2020-09-18 DIAGNOSIS — M542 Cervicalgia: Secondary | ICD-10-CM

## 2020-09-19 ENCOUNTER — Telehealth: Payer: Self-pay

## 2020-09-19 NOTE — Telephone Encounter (Signed)
  Follow up Call-  Call back number 09/17/2020  Post procedure Call Back phone  # [phone]  Permission to leave phone message Yes  Some recent data might be hidden     Patient questions:  Do you have a fever, pain , or abdominal swelling? No. Pain Score  0 *  Have you tolerated food without any problems? Yes.    Have you been able to return to your normal activities? Yes.    Do you have any questions about your discharge instructions: Diet   No. Medications  No. Follow up visit  No.  Do you have questions or concerns about your Care? No.  Actions: * If pain score is 4 or above: No action needed, pain <4.  1. Have you developed a fever since your procedure? no  2.   Have you had an respiratory symptoms (SOB or cough) since your procedure? no  3.   Have you tested positive for COVID 19 since your procedure no  4.   Have you had any family members/close contacts diagnosed with the COVID 19 since your procedure?  no   If yes to any of these questions please route to Joylene John, RN and Joella Prince, RN

## 2020-09-22 ENCOUNTER — Ambulatory Visit: Payer: Medicare Other | Admitting: Neurology

## 2020-09-25 ENCOUNTER — Other Ambulatory Visit: Payer: Self-pay | Admitting: Physician Assistant

## 2020-09-25 NOTE — Telephone Encounter (Signed)
Next Visit: 02/03/2021  Last Visit: 08/05/2020,   Last Fill: 08/25/2020  Dx: Other insomnia  Current Dose per office note on 08/05/2020, Ambien 5 mg p.o. nightly as needed  Okay to refill Ambien?

## 2020-09-26 ENCOUNTER — Encounter: Payer: Self-pay | Admitting: Gastroenterology

## 2020-10-01 ENCOUNTER — Ambulatory Visit
Admission: RE | Admit: 2020-10-01 | Discharge: 2020-10-01 | Disposition: A | Discharge status: Home / Self Care | Payer: Medicare Other | Source: Ambulatory Visit | Attending: Neurology | Admitting: Neurology

## 2020-10-01 ENCOUNTER — Other Ambulatory Visit: Payer: Self-pay

## 2020-10-01 DIAGNOSIS — R519 Headache, unspecified: Secondary | ICD-10-CM

## 2020-10-01 DIAGNOSIS — G43009 Migraine without aura, not intractable, without status migrainosus: Secondary | ICD-10-CM

## 2020-10-01 MED ORDER — GADOBENATE DIMEGLUMINE 529 MG/ML IV SOLN
14.0000 mL | Freq: Once | INTRAVENOUS | Status: AC | PRN
  Administered 2020-10-01: 14 mL via INTRAVENOUS

## 2020-10-03 ENCOUNTER — Ambulatory Visit: Payer: Medicare Other | Admitting: Neurology

## 2020-10-03 ENCOUNTER — Encounter: Payer: Self-pay | Admitting: Neurology

## 2020-10-03 ENCOUNTER — Other Ambulatory Visit: Payer: Self-pay

## 2020-10-03 VITALS — BP 142/79 | HR 82 | Ht 64.0 in | Wt 155.0 lb

## 2020-10-03 DIAGNOSIS — G4761 Periodic limb movement disorder: Secondary | ICD-10-CM | POA: Diagnosis not present

## 2020-10-03 DIAGNOSIS — G43009 Migraine without aura, not intractable, without status migrainosus: Secondary | ICD-10-CM

## 2020-10-03 DIAGNOSIS — M542 Cervicalgia: Secondary | ICD-10-CM | POA: Diagnosis not present

## 2020-10-03 MED ORDER — ZOLMITRIPTAN 5 MG PO TBDP: 5.0000 mg | ORAL_TABLET | ORAL | 5 refills | Status: DC | PRN

## 2020-10-03 NOTE — Progress Notes (Signed)
Follow-up Visit   Date: 10/03/20   Christina Deleon MRN: [id number] DOB: [birth year]   Interim History: Christina Deleon is a 70 y.o. right-handed female with GERD, hypertension, hyperlipidemia, and fibromyalgia returning to the clinic for follow-up of chronic migraine and periodic limb movements.  The patient was accompanied to the clinic by self.  She continues to have neck pain which radiates into her head.  MRI/A brain is unremarkable.  PT has been suggested in the past, which she declined.  She has been referred see a orthopaedics for her neck pain.  She also complains of jaw pain and swelling sensation in the throat.  Her migraines are stable, still occurring about 1-2 times per week and responsive to zomig 2.5-5mg .    Medications:  Current Outpatient Medications on File Prior to Visit  Medication Sig Dispense Refill  . albuterol (PROAIR HFA) 108 (90 Base) MCG/ACT inhaler Inhale 2 puffs into the lungs every 6 (six) hours as needed for wheezing or shortness of breath. 18 g 1  . ALPRAZolam (XANAX) 0.5 MG tablet as needed.    . Ascorbic Acid (VITAMIN C PO) Take 1 tablet by mouth daily.    . Azelastine-Fluticasone 137-50 MCG/ACT SUSP Use one spray in each nostril twice daily 23 g 5  . CALCIUM PO Take 1 tablet by mouth daily.    . candesartan (ATACAND) 16 MG tablet Take 8 mg by mouth at bedtime.    . cetirizine (ZYRTEC) 10 MG tablet Take 1 tablet (10 mg total) by mouth daily. One tab daily for allergies 30 tablet 1  . Cholecalciferol (VITAMIN D) 2000 units tablet Take 2,000 Units by mouth daily.    . Cyanocobalamin (VITAMIN B-12 PO) Take 1 tablet by mouth daily.    Marland Kitchen dicyclomine (BENTYL) 10 MG capsule Take 1 capsule (10 mg total) by mouth 3 (three) times daily before meals. 90 capsule 11  . famotidine (PEPCID) 40 MG tablet Take 1 tablet (40 mg total) by mouth at bedtime. 90 tablet 1  . fluticasone-salmeterol (ADVAIR HFA) 115-21 MCG/ACT inhaler 2 inhalations 1-2 times per day 12 g 5  .  Lifitegrast 5 % SOLN Apply 1 drop to eye 2 (two) times daily.    . methocarbamol (ROBAXIN) 500 MG tablet TAKE 1 TABLET BY MOUTH DAILY AS NEEDED FOR MUSCLE SPASMS 90 tablet 0  . montelukast (SINGULAIR) 10 MG tablet TAKE 1 TABLET(10 MG) BY MOUTH DAILY 90 tablet 0  . nabumetone (RELAFEN) 500 MG tablet TAKE 1 TABLET(500 MG) BY MOUTH TWICE DAILY (Patient taking differently: 2 (two) times daily as needed.) 60 tablet 1  . nystatin (MYCOSTATIN) 100000 UNIT/ML suspension Swish and swallow with 5 ml twice daily as directed. (Patient taking differently: Swish and swallow with 5 ml twice daily as needed) 300 mL 5  . OMEPRAZOLE PO Take 1 capsule by mouth daily.    . rosuvastatin (CRESTOR) 20 MG tablet Take 1 tablet (20 mg total) by mouth daily. 90 tablet 3  . VITAMIN A PO Take by mouth daily.    Marland Kitchen zolmitriptan (ZOMIG-ZMT) 5 MG disintegrating tablet TAKE 1 TABLET BY MOUTH AS NEEDED FOR MIGRAINE 10 tablet 2  . zolpidem (AMBIEN) 5 MG tablet TAKE 1 TABLET(5 MG) BY MOUTH AT BEDTIME AS NEEDED 30 tablet 0   No current facility-administered medications on file prior to visit.    Allergies:  Allergies  Allergen Reactions  . Trazodone Hcl Other (See Comments)    REACTION: bad headaches  . Clindamycin/Lincomycin Other (See  Comments)    headaches  . Penicillins Nausea Only and Rash    Patient has taken Amoxicillin without an issue, per patient. Has patient had a PCN reaction causing immediate rash, facial/tongue/throat swelling, SOB or lightheadedness with hypotension: No Has patient had a PCN reaction causing severe rash involving mucus membranes or skin necrosis: No Has patient had a PCN reaction that required hospitalization No Has patient had a PCN reaction occurring within the last 10 years: No If all of the above answers are "NO", then may proceed with Cephalosporin use.     Vital Signs:  BP (!) 142/79   Pulse 82   Ht 5\' 4"  (1.626 m)   Wt 155 lb (70.3 kg)   LMP 07/02/1994   SpO2 97%   BMI 26.61  kg/m   Neurological Exam: MENTAL STATUS including orientation to time, place, person, recent and remote memory, attention span and concentration, language, and fund of knowledge is normal.  Speech is not dysarthric.  CRANIAL NERVES:   Face is symmetric.   MOTOR:  Motor strength is 5/5 in all extremities. Neck ROM is limited with side-to-side rotation, especially on the left.  Neck strength is 5/5.  No atrophy, fasciculations or abnormal movements.    MSRs:  Reflexes are 2+/4 throughout  SENSATION:  Vibration intact throughout.  COORDINATION/GAIT:   Gait narrow based and stable.   Data:  MRI/A brain 10/01/2020:   Unremarkable MRI and MR angiogram of the brain.  IMPRESSION/PLAN: 1.  Cervicalgia contributing to tension headaches.  MRI brain personally reviewed and does not show any worrisome pathology to explain headaches.   - I think she would benefit from PT, which she declines  2.  Episodic migraine, triggered by stress  - Continue zomig 2.5 - 5mg  twice per week  3.  Periodic limb movements of sleep  -Continue iron supplements and Robaxin as needed  Return to clinic in 6 months  Thank you for allowing me to participate in patient's care.  If I can answer any additional questions, I would be pleased to do so.    Sincerely,    Kendalyn Cranfield K. Posey Pronto, DO

## 2020-10-03 NOTE — Patient Instructions (Signed)
Return to clinic in 6 months.

## 2020-10-15 ENCOUNTER — Encounter: Payer: Self-pay | Admitting: Specialist

## 2020-10-15 ENCOUNTER — Ambulatory Visit: Payer: Medicare Other | Admitting: Specialist

## 2020-10-15 ENCOUNTER — Ambulatory Visit: Payer: Self-pay

## 2020-10-15 VITALS — BP 129/80 | HR 85 | Ht 64.0 in | Wt 155.0 lb

## 2020-10-15 DIAGNOSIS — M542 Cervicalgia: Secondary | ICD-10-CM

## 2020-10-15 DIAGNOSIS — G4486 Cervicogenic headache: Secondary | ICD-10-CM | POA: Diagnosis not present

## 2020-10-15 DIAGNOSIS — M47812 Spondylosis without myelopathy or radiculopathy, cervical region: Secondary | ICD-10-CM | POA: Diagnosis not present

## 2020-10-15 MED ORDER — DICLOFENAC POTASSIUM 50 MG PO TABS: 50.0000 mg | ORAL_TABLET | Freq: Two times a day (BID) | ORAL | 2 refills | Status: DC

## 2020-10-15 NOTE — Progress Notes (Signed)
Office Visit Note   Patient: Christina Deleon           Date of Birth: October 23, 1950           MRN: [id number] Visit Date: 10/15/2020              Requested by: Bo Merino, MD [address] [place],  [place] [address] PCP: Wenda Low, MD   Assessment & Plan: Visit Diagnoses:  1. Osteoarthritis of cervical spine, unspecified spinal osteoarthritis complication status   2. Cervicalgia   3. Spondylosis without myelopathy or radiculopathy, cervical region   4. Headache, cervicogenic     Plan: Avoid overhead lifting and overhead use of the arms. Do not lift greater than 5 lbs. Adjust head rest in vehicle to prevent hyperextension if rear ended. Take extra precautions to avoid falling. Weight loss, NSIADs like diclofenac and exercise. Hemp CBD capsules, [email or web address] 5,000-7,000 mg per bottle, 60 capsules per bottle, take one capsule twice a day.  Follow-Up Instructions: No follow-ups on file.    Follow-Up Instructions: Return in about 4 weeks (around 11/12/2020).   Orders:  Orders Placed This Encounter  Procedures   XR Cervical Spine 2 or 3 views   No orders of the defined types were placed in this encounter.     Procedures: No procedures performed   Clinical Data: No additional findings.   Subjective: Chief Complaint  Patient presents with   Neck - Pain    New Patient referred by Dr. Arlean Hopping    70 year old female right handed with history of left neck pain and pain that shoots into the lateral neck and occasionally down the arms. She takes some meds for inflamation that helps. She sees Dr. Estanislado Pandy for fibromyalgia and takes relafen and robaxin for this. She has experienced pain into the sides of the neck that awaken her while sleeping and this has been happening over the last 2 months. On a scale of 1-10 the pain is an 8-10. She feels like the pain then will go into a migraine. No bowel or bladder difficulty, had a colonoscopy 2 weeks ago and had  4 polyps removed benign.  There is no numbness in the arms.There is a sharp pain that shoots through the arms. She is having difficulty walking sometimes due to stiffness and pain into the hips and knees she related to her back pain.   Review of Systems  Constitutional:  Negative for activity change, appetite change, chills, diaphoresis, fatigue, fever and unexpected weight change.  HENT:  Positive for congestion, postnasal drip, sinus pressure and sinus pain. Negative for rhinorrhea.   Eyes:  Positive for redness and itching.  Respiratory: Negative.    Cardiovascular: Negative.   Gastrointestinal: Negative.   Endocrine: Negative.   Genitourinary: Negative.   Musculoskeletal:  Positive for neck pain and neck stiffness.  Neurological:  Positive for weakness and headaches.  Hematological: Negative.   Psychiatric/Behavioral:  Positive for sleep disturbance. The patient is nervous/anxious.     Objective: Vital Signs: BP 129/80 (BP Location: Left Arm, Patient Position: Sitting)   Pulse 85   Ht 5\' 4"  (1.626 m)   Wt 155 lb (70.3 kg)   LMP 07/02/1994   BMI 26.61 kg/m   Physical Exam Constitutional:      Appearance: She is well-developed.  HENT:     Head: Normocephalic and atraumatic.  Eyes:     Pupils: Pupils are equal, round, and reactive to light.  Pulmonary:  Effort: Pulmonary effort is normal.     Breath sounds: Normal breath sounds.  Abdominal:     General: Bowel sounds are normal.     Palpations: Abdomen is soft.  Musculoskeletal:     Cervical back: Normal range of motion and neck supple.     Lumbar back: Negative right straight leg raise test and negative left straight leg raise test.  Skin:    General: Skin is warm and dry.  Neurological:     Mental Status: She is alert and oriented to person, place, and time.  Psychiatric:        Behavior: Behavior normal.        Thought Content: Thought content normal.        Judgment: Judgment normal.   Back Exam   Range of  Motion  Extension:  80 abnormal  Flexion:  80  Lateral bend right:  70 abnormal  Lateral bend left:  70 abnormal  Rotation right:  abnormal  Rotation left:  70 abnormal   Tests  Straight leg raise right: negative Straight leg raise left: negative    Specialty Comments:  No specialty comments available.  Imaging: No results found.   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   Primary osteoarthritis of both knees 04/19/2016   Leg pain 01/22/2016   Restless legs syndrome 01/22/2016   Snoring 11/19/2015   Periodic limb movement sleep disorder 11/19/2015   GERD (gastroesophageal reflux disease) 10/08/2015   Mild persistent asthma 04/09/2015   LPRD (laryngopharyngeal reflux disease) 04/09/2015   Allergic rhinoconjunctivitis 04/09/2015   HEMATOCHEZIA 04/01/2010   COUGH 04/01/2010   GASTRITIS 09/30/2009   NAUSEA 09/30/2009   ABDOMINAL PAIN-EPIGASTRIC 09/30/2009   DYSTHYMIC DISORDER 09/26/2009   PALPITATIONS 06/17/2009   Impingement syndrome of shoulder, left 06/04/2009   CONSTIPATION 02/05/2009   PERSONAL HX COLONIC POLYPS 02/05/2009   ASTHMA, PERSISTENT, MILD 02/04/2009   CHEST PAIN, ATYPICAL 02/04/2009   COLONIC POLYPS, BENIGN 12/18/2008   Hyperlipidemia LDL goal <70 12/18/2008   Insomnia 12/18/2008   MIGRAINE, COMMON 12/18/2008   Essential hypertension 12/18/2008   ALLERGIC RHINITIS 12/18/2008   Primary osteoarthritis of both hands 12/18/2008   DJD (degenerative joint disease), cervical 12/18/2008   NECK PAIN, CHRONIC 12/18/2008   LOW BACK PAIN, CHRONIC 12/18/2008   Past Medical History:  Diagnosis Date   Adenomatous colon polyp 10/1997, and 03/2009   Allergic rhinitis    Allergy    Arthritis    osteo   Asthma    Chronic gastritis    Fibromyalgia    GERD (gastroesophageal reflux  disease)    Hemorrhoids    Hyperlipidemia    Hypertension    Menorrhagia    partial hysterectomy   Migraines    Osteoarthritis    PONV (postoperative nausea and vomiting)    Sleep disturbance    STD (sexually transmitted disease) 01/22/11 culture proven   HSV type I labia    Family History  Problem Relation Age of Onset   Throat cancer Father    Coronary artery disease Father    Heart attack Father 61   Stroke Father        during procedure   Coronary artery disease Mother    Hypertension Mother    Hyperlipidemia Mother    Pulmonary fibrosis Mother    Lung  disease Mother    Prostate cancer Brother    Heart disease Brother    Diabetes Brother    Diabetes Paternal Aunt        x 2   Diabetes Paternal Grandmother    Coronary artery disease Paternal Grandmother    Stroke Maternal Grandmother        or MI   Prostate cancer Paternal Uncle    Migraines Daughter    Migraines Daughter    Colon cancer Neg Hx     Past Surgical History:  Procedure Laterality Date   CATARACT EXTRACTION Bilateral 10/16 & 11/16   EXCISION METACARPAL MASS Right 11/16/2016   Procedure: RIGHT LITTLE FINGER CYST EXCISION;  Surgeon: Milly Jakob, MD;  Location: [facility];  Service: Orthopedics;  Laterality: Right;   TONSILLECTOMY  age 70   tooth extract     with bone graft   Turbinate sinus surgery  2003   [place]   secondary to prolapse, ovaries remain   Social History   Occupational History   Occupation: Retired    Comment:  Metallurgist unemployed sincesince 6/10  Tobacco Use   Smoking status: Never   Smokeless tobacco: Never   Tobacco comments:    Father growing up  Vaping Use   Vaping Use: Never used  Substance and Sexual Activity   Alcohol use: No    Alcohol/week: 0.0 standard drinks   Drug use: Never   Sexual activity: Yes    Partners: Male    Birth control/protection: Surgical    Comment: hysterectomy

## 2020-10-15 NOTE — Patient Instructions (Signed)
Avoid overhead lifting and overhead use of the arms. Do not lift greater than 5 lbs. Adjust head rest in vehicle to prevent hyperextension if rear ended. Take extra precautions to avoid falling. Weight loss, NSIADs like diclofenac and exercise. Hemp CBD capsules, [email or web address] 5,000-7,000 mg per bottle, 60 capsules per bottle, take one capsule twice a day.  Follow-Up Instructions: No follow-ups on file.

## 2020-10-23 ENCOUNTER — Other Ambulatory Visit: Payer: Self-pay | Admitting: Physician Assistant

## 2020-10-24 NOTE — Telephone Encounter (Signed)
Next Visit: 02/03/2021   Last Visit: 08/05/2020,    Last Fill: 09/25/2020   Dx: Other insomnia   Current Dose per office note on 08/05/2020, Ambien 5 mg p.o. nightly as needed   Okay to refill Ambien?

## 2020-11-11 ENCOUNTER — Other Ambulatory Visit: Payer: Self-pay | Admitting: Allergy and Immunology

## 2020-11-11 ENCOUNTER — Other Ambulatory Visit: Payer: Self-pay | Admitting: Interventional Cardiology

## 2020-11-12 ENCOUNTER — Ambulatory Visit: Payer: Medicare Other | Admitting: Specialist

## 2020-11-12 ENCOUNTER — Other Ambulatory Visit: Payer: Self-pay

## 2020-11-12 ENCOUNTER — Encounter: Payer: Self-pay | Admitting: Specialist

## 2020-11-12 VITALS — BP 122/76 | HR 84 | Ht 64.0 in | Wt 155.0 lb

## 2020-11-12 DIAGNOSIS — Z791 Long term (current) use of non-steroidal anti-inflammatories (NSAID): Secondary | ICD-10-CM

## 2020-11-12 DIAGNOSIS — M542 Cervicalgia: Secondary | ICD-10-CM

## 2020-11-12 DIAGNOSIS — M47812 Spondylosis without myelopathy or radiculopathy, cervical region: Secondary | ICD-10-CM

## 2020-11-12 DIAGNOSIS — G4486 Cervicogenic headache: Secondary | ICD-10-CM | POA: Diagnosis not present

## 2020-11-12 NOTE — Patient Instructions (Signed)
Plan: Avoid overhead lifting and overhead use of the arms. Do not lift greater than 5 lbs. Adjust head rest in vehicle to prevent hyperextension if rear ended. Take extra precautions to avoid falling. Weight loss, NSIADs like diclofenac and exercise. Laboratory test for evaluation for ensuring that diclofenac does not cause liver of kidney irritation. Follow-Up Instructions: No follow-ups on file.

## 2020-11-12 NOTE — Progress Notes (Signed)
Office Visit Note   Patient: Christina Deleon           Date of Birth: December 24, 1950           MRN: [id number] Visit Date: 11/12/2020              Requested by: Wenda Low, MD [address] [place] [place],  [place] [address] PCP: Wenda Low, MD   Assessment & Plan: Visit Diagnoses:  1. Osteoarthritis of cervical spine, unspecified spinal osteoarthritis complication status   2. Headache, cervicogenic   3. Cervicalgia   4. Spondylosis without myelopathy or radiculopathy, cervical region    Plan: Avoid overhead lifting and overhead use of the arms. Do not lift greater than 5 lbs. Adjust head rest in vehicle to prevent hyperextension if rear ended. Take extra precautions to avoid falling. Weight loss, NSIADs like diclofenac and exercise. Least amount that is effective is good.  If pain worsens then therapy or over the door traction unit may be helpful. Obtain laboratory test to be sure medication does not cause liver or kidney irritation.    Follow-Up Instructions: No follow-ups on file.    Follow-Up Instructions: No follow-ups on file.   Orders:  No orders of the defined types were placed in this encounter.  No orders of the defined types were placed in this encounter.     Procedures: No procedures performed   Clinical Data: No additional findings.   Subjective: Chief Complaint  Patient presents with   Neck - Pain    69 year old female right handed with history of neck pain without arm pain or numbness or paresthesias. No bowel or bladder difficulty. No pain with Coughing or sneezing. Has AM stiffness and lower back pains.    Review of Systems  Constitutional: Negative.   HENT: Negative.    Eyes: Negative.   Respiratory: Negative.    Cardiovascular: Negative.   Gastrointestinal: Negative.   Endocrine: Negative.   Genitourinary: Negative.   Musculoskeletal: Negative.   Skin: Negative.   Allergic/Immunologic: Negative.   Neurological: Negative.    Hematological: Negative.   Psychiatric/Behavioral: Negative.      Objective: Vital Signs: BP 122/76   Pulse 84   Ht 5\' 4"  (1.626 m)   Wt 155 lb (70.3 kg)   LMP 07/02/1994   BMI 26.61 kg/m   Physical Exam Constitutional:      Appearance: She is well-developed.  HENT:     Head: Normocephalic and atraumatic.  Eyes:     Pupils: Pupils are equal, round, and reactive to light.  Pulmonary:     Effort: Pulmonary effort is normal.     Breath sounds: Normal breath sounds.  Abdominal:     General: Bowel sounds are normal.     Palpations: Abdomen is soft.  Musculoskeletal:     Cervical back: Normal range of motion and neck supple.     Lumbar back: Negative right straight leg raise test and negative left straight leg raise test.  Skin:    General: Skin is warm and dry.  Neurological:     Mental Status: She is alert and oriented to person, place, and time.  Psychiatric:        Behavior: Behavior normal.        Thought Content: Thought content normal.        Judgment: Judgment normal.   Back Exam   Tenderness  The patient is experiencing tenderness in the cervical.  Range of Motion  Extension:  abnormal  Flexion:  abnormal  Lateral bend right:  abnormal  Lateral bend left:  abnormal  Rotation right:  abnormal  Rotation left:  abnormal   Muscle Strength  Right Quadriceps:  5/5  Left Quadriceps:  5/5  Right Hamstrings:  5/5  Left Hamstrings:  5/5   Tests  Straight leg raise right: negative Straight leg raise left: negative  Other  Toe walk: normal Heel walk: normal Gait: normal   Comments:  Motor is intact.    Specialty Comments:  No specialty comments available.  Imaging: No results found.   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   Primary osteoarthritis of both knees  04/19/2016   Leg pain 01/22/2016   Restless legs syndrome 01/22/2016   Snoring 11/19/2015   Periodic limb movement sleep disorder 11/19/2015   GERD (gastroesophageal reflux disease) 10/08/2015   Mild persistent asthma 04/09/2015   LPRD (laryngopharyngeal reflux disease) 04/09/2015   Allergic rhinoconjunctivitis 04/09/2015   HEMATOCHEZIA 04/01/2010   COUGH 04/01/2010   GASTRITIS 09/30/2009   NAUSEA 09/30/2009   ABDOMINAL PAIN-EPIGASTRIC 09/30/2009   DYSTHYMIC DISORDER 09/26/2009   PALPITATIONS 06/17/2009   Impingement syndrome of shoulder, left 06/04/2009   CONSTIPATION 02/05/2009   PERSONAL HX COLONIC POLYPS 02/05/2009   ASTHMA, PERSISTENT, MILD 02/04/2009   CHEST PAIN, ATYPICAL 02/04/2009   COLONIC POLYPS, BENIGN 12/18/2008   Hyperlipidemia LDL goal <70 12/18/2008   Insomnia 12/18/2008   MIGRAINE, COMMON 12/18/2008   Essential hypertension 12/18/2008   ALLERGIC RHINITIS 12/18/2008   Primary osteoarthritis of both hands 12/18/2008   DJD (degenerative joint disease), cervical 12/18/2008   NECK PAIN, CHRONIC 12/18/2008   LOW BACK PAIN, CHRONIC 12/18/2008   Past Medical History:  Diagnosis Date   Adenomatous colon polyp 10/1997, and 03/2009   Allergic rhinitis    Allergy    Arthritis    osteo   Asthma    Chronic gastritis    Fibromyalgia    GERD (gastroesophageal reflux disease)    Hemorrhoids    Hyperlipidemia    Hypertension    Menorrhagia    partial hysterectomy   Migraines    Osteoarthritis    PONV (postoperative nausea and vomiting)    Sleep disturbance    STD (sexually transmitted disease) 01/22/11 culture proven   HSV type I labia    Family History  Problem Relation Age of Onset   Throat cancer Father    Coronary artery disease Father    Heart attack Father 62   Stroke Father        during procedure   Coronary artery disease Mother    Hypertension Mother    Hyperlipidemia Mother    Pulmonary fibrosis Mother    Lung disease Mother    Prostate cancer  Brother    Heart disease Brother    Diabetes Brother    Diabetes Paternal Aunt        x 2   Diabetes Paternal Grandmother    Coronary artery disease Paternal Grandmother    Stroke Maternal Grandmother        or MI   Prostate cancer Paternal Uncle    Migraines Daughter    Migraines Daughter    Colon cancer Neg Hx     Past Surgical History:  Procedure Laterality Date   CATARACT EXTRACTION Bilateral 10/16 & 11/16  EXCISION METACARPAL MASS Right 11/16/2016   Procedure: RIGHT LITTLE FINGER CYST EXCISION;  Surgeon: Milly Jakob, MD;  Location: [facility];  Service: Orthopedics;  Laterality: Right;   TONSILLECTOMY  age 19   tooth extract     with bone graft   Turbinate sinus surgery  2003   [place]   secondary to prolapse, ovaries remain   Social History   Occupational History   Occupation: Retired    Comment:  Metallurgist unemployed sincesince 6/10  Tobacco Use   Smoking status: Never   Smokeless tobacco: Never   Tobacco comments:    Father growing up  Vaping Use   Vaping Use: Never used  Substance and Sexual Activity   Alcohol use: No    Alcohol/week: 0.0 standard drinks   Drug use: Never   Sexual activity: Yes    Partners: Male    Birth control/protection: Surgical    Comment: hysterectomy

## 2020-11-13 LAB — COMPREHENSIVE METABOLIC PANEL
AG Ratio: 1.6 (calc) (ref 1.0–2.5)
ALT: 12 U/L (ref 6–29)
AST: 16 U/L (ref 10–35)
Albumin: 3.9 g/dL (ref 3.6–5.1)
Alkaline phosphatase (APISO): 61 U/L (ref 37–153)
BUN: 14 mg/dL (ref 7–25)
CO2: 28 mmol/L (ref 20–32)
Calcium: 9.2 mg/dL (ref 8.6–10.4)
Chloride: 108 mmol/L (ref 98–110)
Creat: 0.91 mg/dL (ref 0.60–1.00)
Globulin: 2.5 g/dL (calc) (ref 1.9–3.7)
Glucose, Bld: 84 mg/dL (ref 65–99)
Potassium: 3.9 mmol/L (ref 3.5–5.3)
Sodium: 143 mmol/L (ref 135–146)
Total Bilirubin: 0.3 mg/dL (ref 0.2–1.2)
Total Protein: 6.4 g/dL (ref 6.1–8.1)

## 2020-11-21 ENCOUNTER — Other Ambulatory Visit: Payer: Self-pay | Admitting: Rheumatology

## 2020-11-21 NOTE — Telephone Encounter (Signed)
Next Visit: 02/03/2021   Last Visit: 08/05/2020,    Last Fill: 10/24/2020   Dx: Other insomnia   Current Dose per office note on 08/05/2020, Ambien 5 mg p.o. nightly as needed   Okay to refill Ambien?

## 2020-12-07 ENCOUNTER — Encounter: Payer: Self-pay | Admitting: Physician Assistant

## 2020-12-07 NOTE — Progress Notes (Signed)
Cardiology Office Note    Date:  12/08/2020   ID:  Christina Deleon, DOB [birth year], MRN [id number]  PCP:  Wenda Low, MD  Cardiologist:  Sinclair Grooms, MD  Electrophysiologist:  None   Chief Complaint: f/u palpitations  History of Present Illness:   Christina Deleon is a 70 y.o. female with history of HTN, HLD, asthma, PACs, elevated calcium score and aortic atherosclerosis, fibromyalgia, migraines who presents for routine follow-up. She established care with Dr. Tamala Julian in 11/2019 for palpitations. 2D Echo 12/2019 EF 60-65%, grade 1 DD, mild aortic sclerosis without stenosis. Event monitor showed NSR with rare PACs (symptoms of fluttering did not correlate with significant arrhythmia), average HR 86. Calcium score 12/2019 of 177 (85%ile) with descending aortic atherosclerosis.  She is seen back for follow-up overall doing well. She has rare episodic "skip" type palpitations but no sustained tachypalpitations. She does not formally exercise but remains active with household ADLs and occasionally working in the garden. She has not had any new chest pain or dyspnea with these activities. Her major limitation is related to arthritis issues in her neck.  Labwork independently reviewed: 10/2020 CMET wnl - K 3.9 08/2020 TSH wnl 08/2020 CBC wnl 03/2020 KPN LDL 71 01/2020 LDL 70, trig 67   Past Medical History:  Diagnosis Date   Adenomatous colon polyp 10/1997, and 03/2009   Allergic rhinitis    Allergy    Aortic atherosclerosis (HCC)    Arthritis    osteo   Asthma    Chronic gastritis    Coronary artery calcification seen on CT scan    Fibromyalgia    GERD (gastroesophageal reflux disease)    Hemorrhoids    Hyperlipidemia    Hypertension    Menorrhagia    partial hysterectomy   Migraines    Osteoarthritis    PONV (postoperative nausea and vomiting)    Premature atrial contractions    Sleep disturbance    STD (sexually transmitted disease) 01/22/11 culture proven   HSV type I labia     Past Surgical History:  Procedure Laterality Date   CATARACT EXTRACTION Bilateral 10/16 & 11/16   EXCISION METACARPAL MASS Right 11/16/2016   Procedure: RIGHT LITTLE FINGER CYST EXCISION;  Surgeon: Milly Jakob, MD;  Location: [place];  Service: Orthopedics;  Laterality: Right;   TONSILLECTOMY  age 84   tooth extract     with bone graft   Turbinate sinus surgery  2003   [place]   secondary to prolapse, ovaries remain    Current Medications: Current Meds  Medication Sig   albuterol (PROAIR HFA) 108 (90 Base) MCG/ACT inhaler Inhale 2 puffs into the lungs every 6 (six) hours as needed for wheezing or shortness of breath.   ALPRAZolam (XANAX) 0.5 MG tablet as needed. (Patient takes very rarely - one dose at a time - was given rx for 15 pills, has 14 left)   Ascorbic Acid (VITAMIN C PO) Take 1 tablet by mouth daily.   Azelastine-Fluticasone 137-50 MCG/ACT SUSP Use one spray in each nostril twice daily   CALCIUM PO Take 1 tablet by mouth daily.   candesartan (ATACAND) 16 MG tablet Take 8 mg by mouth at bedtime.   cetirizine (ZYRTEC) 10 MG tablet Take 1 tablet (10 mg total) by mouth daily. One tab daily for allergies   Cholecalciferol (VITAMIN D) 2000 units tablet Take 2,000 Units by mouth daily.   Cyanocobalamin (VITAMIN B-12 PO) Take 1 tablet by  mouth daily.   diclofenac (CATAFLAM) 50 MG tablet Take 50 mg by mouth once as needed (neck pain).   dicyclomine (BENTYL) 10 MG capsule Take 1 capsule (10 mg total) by mouth 3 (three) times daily before meals.   famotidine (PEPCID) 40 MG tablet Take 1 tablet (40 mg total) by mouth at bedtime.   fluticasone-salmeterol (ADVAIR HFA) 115-21 MCG/ACT inhaler 2 inhalations 1-2 times per day   Lifitegrast 5 % SOLN Apply 1 drop to eye 2 (two) times daily.   methocarbamol (ROBAXIN) 500 MG tablet TAKE 1 TABLET BY MOUTH DAILY AS NEEDED FOR MUSCLE SPASMS   montelukast (SINGULAIR) 10 MG tablet TAKE 1 TABLET(10 MG) BY  MOUTH DAILY   nystatin (MYCOSTATIN) 100000 UNIT/ML suspension Swish and swallow with 5 ml twice daily as directed.   OMEPRAZOLE PO Take 1 capsule by mouth daily.   pantoprazole (PROTONIX) 40 MG tablet TAKE 1 TABLET(40 MG) BY MOUTH TWICE DAILY   rosuvastatin (CRESTOR) 20 MG tablet TAKE 1 TABLET(20 MG) BY MOUTH DAILY   VITAMIN A PO Take by mouth daily.   zolmitriptan (ZOMIG-ZMT) 5 MG disintegrating tablet Take 1 tablet (5 mg total) by mouth as needed for migraine.   zolpidem (AMBIEN) 5 MG tablet TAKE 1 TABLET(5 MG) BY MOUTH AT BEDTIME AS NEEDED   Patient did not know sigs on vitamins  Allergies:   Trazodone hcl, Clindamycin/lincomycin, and Penicillins   Social History   Socioeconomic History   Marital status: Married    Spouse name: Not on file   Number of children: 2   Years of education: Not on file   Highest education level: Not on file  Occupational History   Occupation: Retired    Comment:  Metallurgist unemployed sincesince 6/10  Tobacco Use   Smoking status: Never   Smokeless tobacco: Never   Tobacco comments:    Father growing up  Vaping Use   Vaping Use: Never used  Substance and Sexual Activity   Alcohol use: No    Alcohol/week: 0.0 standard drinks   Drug use: Never   Sexual activity: Yes    Partners: Male    Birth control/protection: Surgical    Comment: hysterectomy  Other Topics Concern   Not on file  Social History Narrative   She lives with husband.  They have 2 grown children.   She is retired Product/process development scientist.   Highest level of education:  2 years of college   Right handed   Regular exercise, diet of fruits, veggies, limited fried foods, limited water      [facility]:   Originally from [place]. Has also lived in [place]. Previously worked doing Software engineer. No pets currently. No bird, mold, or hot tub exposure. Does have a musty smell in their home but it is new. Enjoys reading. 1 indoor plant. Has  carpet in her home including in the bedroom. Also has draperies.    Right handed   Social Determinants of Health   Financial Resource Strain: Not on file  Food Insecurity: Not on file  Transportation Needs: Not on file  Physical Activity: Not on file  Stress: Not on file  Social Connections: Not on file     Family History:  The patient's family history includes Coronary artery disease in her father, mother, and paternal grandmother; Diabetes in her brother, paternal aunt, and paternal grandmother; Heart attack (age of onset: 43) in her father; Heart disease in her brother; Hyperlipidemia in her mother; Hypertension in her  mother; Lung disease in her mother; Migraines in her daughter and daughter; Prostate cancer in her brother and paternal uncle; Pulmonary fibrosis in her mother; Stroke in her father and maternal grandmother; Throat cancer in her father. There is no history of Colon cancer.  ROS:   Please see the history of present illness. + occasional stomach upset eating certain foods. All other systems are reviewed and otherwise negative.    EKGs/Labs/Other Studies Reviewed:    Studies reviewed are outlined and summarized above. Reports included below if pertinent.  2D Echo 12/2019    1. Left ventricular ejection fraction, by estimation, is 60 to 65%. The  left ventricle has normal function. The left ventricle has no regional  wall motion abnormalities. Left ventricular diastolic parameters are  consistent with Grade I diastolic  dysfunction (impaired relaxation).   2. Right ventricular systolic function is normal. The right ventricular  size is normal.   3. The mitral valve is normal in structure. No evidence of mitral valve  regurgitation. No evidence of mitral stenosis.   4. The aortic valve is tricuspid. Aortic valve regurgitation is not  visualized. Mild aortic valve sclerosis is present, with no evidence of  aortic valve stenosis.   5. The inferior vena cava is normal  in size with greater than 50%  respiratory variability, suggesting right atrial pressure of 3 mmHg.   Cardiac monitor 12/2019 Sinus rhythm Rare PAC's Symptom of flutter did not corrlate with significant arrhythmia. On one occasion there was an isolated [place] No sustained atrial or ventricular rhythms  Coronary Calcium Score 12/2019 EXAM: Coronary Calcium Score   TECHNIQUE: The patient was scanned on a [employer]. Axial non-contrast 3 mm slices were carried out through the heart. The data set was analyzed on a dedicated work station and scored using the [place].   FINDINGS: Non-cardiac: See separate report from [facility].   Ascending Aorta: Scattered descending aortic atherosclerosis   Pericardium: Normal   Coronary arteries: Normal origins. Distal left main, proximal LAD and RCA calcification.   IMPRESSION: 1. Coronary calcium score of 177. This was 7 percentile for age and sex matched control.   2.  Descending aortic atherosclerosis.   Candee Furbish, MD [facility]      EKG:  EKG is ordered today, personally reviewed, demonstrating NSR 97bpm, no acute STT Changes  Recent Labs: 08/05/2020: Hemoglobin 13.2; Platelets 344 11/12/2020: ALT 12; BUN 14; Creat 0.91; Potassium 3.9; Sodium 143  Recent Lipid Panel    Component Value Date/Time   CHOL 161 01/16/2020 0936   TRIG 67 01/16/2020 0936   HDL 78 01/16/2020 0936   CHOLHDL 2.1 01/16/2020 0936   CHOLHDL 3 12/18/2008 1034   VLDL 16.8 12/18/2008 1034   LDLCALC 70 01/16/2020 0936    PHYSICAL EXAM:    VS:  BP 122/60   Pulse 97   Ht '5\' 4"'$  (1.626 m)   Wt 156 lb 12.8 oz (71.1 kg)   LMP 07/02/1994   SpO2 96%   BMI 26.91 kg/m   BMI: Body mass index is 26.91 kg/m.  GEN: Well nourished, well developed female in no acute distress HEENT: normocephalic, atraumatic Neck: no JVD, carotid bruits, or masses Cardiac: RRR; no murmurs, rubs, or gallops, no edema  Respiratory:  clear to auscultation  bilaterally, normal work of breathing GI: soft, nontender, nondistended, + BS MS: no deformity or atrophy Skin: warm and dry, no rash Neuro:  Alert and Oriented x 3, Strength and sensation are intact, follows commands  Psych: euthymic mood, full affect  Wt Readings from Last 3 Encounters:  12/08/20 156 lb 12.8 oz (71.1 kg)  11/12/20 155 lb (70.3 kg)  10/15/20 155 lb (70.3 kg)     ASSESSMENT & PLAN:   1. Palpitations with known history of PACs - no accelerating symptoms. We discussed option of medication therapy if these become bothersome but she prefers to hold off. Her HR is noted in the 90s today which she states is normal for her (HR 80s-100s in EMR). We will update her labs today to include CBC, TSH, Mg, CMET for lytes. She will notify us for any accelerating symptoms.  2. Coronary calcification seen on CT scan with aortic atherosclerosis - asymptomatic. Continue risk factor modification. Blood pressure is well controlled. Will check CMET and fasting lipid profile today. I will reach out to Dr. Tamala Julian to find out if he feels she should be started on prophylactic baby aspirin.  3. Essential HTN - BP is well controlled on current therapy (ARB). No changes made today.  4. Hyperlipidemia goal LDL <70 - recheck lipids with CMET today. If LDL is higher than 70 would consider increasing rosuvastatin dose.   Disposition: F/u with Dr. Tamala Julian in 1 year. She is also noted to have 2 PPIs listed on her medicine list - she thinks this was intentional from her asthma team but I asked her to reach out to the prescribing doctor to clarify what she should be taking.    Medication Adjustments/Labs and Tests Ordered: Current medicines are reviewed at length with the patient today.  Concerns regarding medicines are outlined above. Medication changes, Labs and Tests ordered today are summarized above and listed in the Patient Instructions accessible in Encounters.   Signed, Charlie Pitter, PA-C  12/08/2020  8:57 AM    [address], [place], [place]  [address] Phone: [phone]; Fax: [phone]

## 2020-12-08 ENCOUNTER — Encounter: Payer: Self-pay | Admitting: Physician Assistant

## 2020-12-08 ENCOUNTER — Ambulatory Visit: Payer: Medicare Other | Admitting: Physician Assistant

## 2020-12-08 ENCOUNTER — Other Ambulatory Visit: Payer: Self-pay

## 2020-12-08 VITALS — BP 122/60 | HR 97 | Ht 64.0 in | Wt 156.8 lb

## 2020-12-08 DIAGNOSIS — I2510 Atherosclerotic heart disease of native coronary artery without angina pectoris: Secondary | ICD-10-CM

## 2020-12-08 DIAGNOSIS — R002 Palpitations: Secondary | ICD-10-CM

## 2020-12-08 DIAGNOSIS — I700 Atherosclerosis of aorta: Secondary | ICD-10-CM | POA: Diagnosis not present

## 2020-12-08 DIAGNOSIS — E785 Hyperlipidemia, unspecified: Secondary | ICD-10-CM

## 2020-12-08 DIAGNOSIS — I491 Atrial premature depolarization: Secondary | ICD-10-CM

## 2020-12-08 DIAGNOSIS — I10 Essential (primary) hypertension: Secondary | ICD-10-CM

## 2020-12-08 LAB — COMPREHENSIVE METABOLIC PANEL
ALT: 13 IU/L (ref 0–32)
AST: 20 IU/L (ref 0–40)
Albumin/Globulin Ratio: 1.4 (ref 1.2–2.2)
Albumin: 3.8 g/dL (ref 3.8–4.8)
Alkaline Phosphatase: 77 IU/L (ref 44–121)
BUN/Creatinine Ratio: 16 (ref 12–28)
BUN: 15 mg/dL (ref 8–27)
Bilirubin Total: <0.2 mg/dL (ref 0.0–1.2)
CO2: 25 mmol/L (ref 20–29)
Calcium: 9.2 mg/dL (ref 8.7–10.3)
Chloride: 107 mmol/L — ABNORMAL HIGH (ref 96–106)
Creatinine, Ser: 0.92 mg/dL (ref 0.57–1.00)
Globulin, Total: 2.7 g/dL (ref 1.5–4.5)
Glucose: 101 mg/dL — ABNORMAL HIGH (ref 65–99)
Potassium: 4.1 mmol/L (ref 3.5–5.2)
Sodium: 144 mmol/L (ref 134–144)
Total Protein: 6.5 g/dL (ref 6.0–8.5)
eGFR: 67 mL/min/{1.73_m2} (ref >59)

## 2020-12-08 LAB — LIPID PANEL
Chol/HDL Ratio: 2.3 ratio (ref 0.0–4.4)
Cholesterol, Total: 130 mg/dL (ref 100–199)
HDL: 57 mg/dL (ref >39)
LDL Chol Calc (NIH): 59 mg/dL (ref 0–99)
Triglycerides: 71 mg/dL (ref 0–149)
VLDL Cholesterol Cal: 14 mg/dL (ref 5–40)

## 2020-12-08 LAB — TSH: TSH: 6.78 u[IU]/mL — ABNORMAL HIGH (ref 0.450–4.500)

## 2020-12-08 LAB — CBC
Hematocrit: 37.2 % (ref 34.0–46.6)
Hemoglobin: 12.5 g/dL (ref 11.1–15.9)
MCH: 29.6 pg (ref 26.6–33.0)
MCHC: 33.6 g/dL (ref 31.5–35.7)
MCV: 88 fL (ref 79–97)
Platelets: 262 10*3/uL (ref 150–450)
RBC: 4.22 x10E6/uL (ref 3.77–5.28)
RDW: 12.2 % (ref 11.7–15.4)
WBC: 4.1 10*3/uL (ref 3.4–10.8)

## 2020-12-08 LAB — MAGNESIUM: Magnesium: 2.3 mg/dL (ref 1.6–2.3)

## 2020-12-08 NOTE — Patient Instructions (Signed)
Medication Instructions:  Your physician recommends that you continue on your current medications as directed. Please refer to the Current Medication list given to you today.  *If you need a refill on your cardiac medications before your next appointment, please call your pharmacy*   Lab Work: TODAY:  CMET6, MAG, LIPID, CBC, & TSH  If you have labs (blood work) drawn today and your tests are completely normal, you will receive your results only by: [place] (if you have MyChart) OR A paper copy in the mail If you have any lab test that is abnormal or we need to change your treatment, we will call you to review the results.   Testing/Procedures:  None ordered   Follow-Up: At [facility], you and your health needs are our priority.  As part of our continuing mission to provide you with exceptional heart care, we have created designated Provider Care Teams.  These Care Teams include your primary Cardiologist (physician) and Advanced Practice Providers (APPs -  Physician Assistants and Nurse Practitioners) who all work together to provide you with the care you need, when you need it.  We recommend signing up for the patient portal called "MyChart".  Sign up information is provided on this After Visit Summary.  MyChart is used to connect with patients for Virtual Visits (Telemedicine).  Patients are able to view lab/test results, encounter notes, upcoming appointments, etc.  Non-urgent messages can be sent to your provider as well.   To learn more about what you can do with MyChart, go to [email or web address].    Your next appointment:   12 month(s)  The format for your next appointment:   In Person  Provider:   You may see Sinclair Grooms, MD or one of the following Advanced Practice Providers on your designated Care Team:   Cecilie Kicks, NP    Other Instructions Please talk to your prescribing doctor about whether you should be on both, Omeprazole & Pantoprazole.

## 2020-12-09 ENCOUNTER — Telehealth: Payer: Self-pay | Admitting: Physician Assistant

## 2020-12-09 NOTE — Telephone Encounter (Signed)
   Please let pt know I had reached out to Dr. Tamala Julian after recent OV whether she should start a baby aspirin '81mg'$  given her elevated calcium score - he would recommend we go ahead and start this. Remind her that for any pt on a blood thinner, if she notices any bleeding such as blood in stool, black tarry stools, blood in urine, nosebleeds or any other unusual bleeding, reach out to her primary care doctor. It is not normal to have this kind of bleeding while on a blood thinner and sometimes indicates there is an underlying problem that needs to be checked out. Thx.

## 2020-12-10 MED ORDER — ASPIRIN EC 81 MG PO TBEC: 81.0000 mg | DELAYED_RELEASE_TABLET | Freq: Every day | ORAL | 3 refills | Status: DC

## 2020-12-10 NOTE — Telephone Encounter (Signed)
Pt has been made aware that she needed to start Aspiri 81 mg daily.

## 2020-12-10 NOTE — Addendum Note (Signed)
Addended by: Gaetano Net on: 12/10/2020 09:29 AM   Modules accepted: Orders

## 2020-12-22 ENCOUNTER — Other Ambulatory Visit: Payer: Self-pay | Admitting: Physician Assistant

## 2020-12-22 NOTE — Telephone Encounter (Signed)
Next Visit: 02/03/2021   Last Visit: 08/05/2020,    Last Fill: 11/21/2020   Dx: Other insomnia   Current Dose per office note on 08/05/2020, Ambien 5 mg p.o. nightly as needed   Okay to refill Ambien?

## 2020-12-30 ENCOUNTER — Telehealth: Payer: Self-pay | Admitting: *Deleted

## 2020-12-30 NOTE — Telephone Encounter (Signed)
Received a potential clinical concern.   Potential Clinical Concern: Drug-Age: Zolpidem  Possible Risks: Adverse effects such as delirium and falls, increasing the risk for fractures in elderly.   Reviewed by: Hazel Sams, PA-C  Recommendations: Notify patient of this alert. Advise patient she should take Ambien as needed. Patient is on the reduced dose of Ambien.   Notified patient of potential clinical concern. Advised risk include: Adverse effects such as delirium and falls, increasing the risk for fractures in elderly. Patient advised of the following recommendations: she should take Ambien as needed. Patient is on the reduced dose of Ambien. Patient expressed understanding.

## 2021-01-07 ENCOUNTER — Encounter: Payer: Self-pay | Admitting: Allergy and Immunology

## 2021-01-07 ENCOUNTER — Other Ambulatory Visit: Payer: Self-pay

## 2021-01-07 ENCOUNTER — Ambulatory Visit: Payer: Medicare Other | Admitting: Allergy and Immunology

## 2021-01-07 VITALS — BP 122/82 | HR 79 | Resp 16

## 2021-01-07 DIAGNOSIS — K219 Gastro-esophageal reflux disease without esophagitis: Secondary | ICD-10-CM | POA: Diagnosis not present

## 2021-01-07 DIAGNOSIS — J3089 Other allergic rhinitis: Secondary | ICD-10-CM | POA: Diagnosis not present

## 2021-01-07 DIAGNOSIS — J4540 Moderate persistent asthma, uncomplicated: Secondary | ICD-10-CM | POA: Diagnosis not present

## 2021-01-07 NOTE — Progress Notes (Signed)
[place]   Follow-up Note  Referring Provider: Wenda Low, MD Primary Provider: Wenda Low, MD Date of Office Visit: 01/07/2021  Subjective:   Christina Deleon (DOB: 1951/04/11) is a 70 y.o. female who returns to the [facility] and [place] on 01/07/2021 in re-evaluation of the following:  HPI: Kwame returns to this clinic in reevaluation of asthma and allergic rhinitis and LPR and a family history of osteoporosis.  Her last visit to this clinic was 04 July 2019.  She has not required a systemic steroid or an antibiotic regarding any type of airway issue.  Her use of a short acting bronchodilator is less than twice a week.  She uses her Advair mostly 1 time per day.  She intermittently uses Dymista and consistently uses montelukast.  She still has throat clearing and a sensation that there is a coating in her throat.  She has been very good about treating her LPR with a proton pump inhibitor and H2 receptor blocker.  She does clear her throat a lot.  She continues to use nystatin mostly once a day and occasionally twice a day to prevent and treat recurrent thrush.  Her bone density scan was completed.  Apparently she has osteopenia and she had a vitamin D level checked and it was okay.  Allergies as of 01/07/2021       Reactions   Trazodone Hcl Other (See Comments)   REACTION: bad headaches   Clindamycin/lincomycin Other (See Comments)   headaches   Penicillins Nausea Only, Rash   Patient has taken Amoxicillin without an issue, per patient. Has patient had a PCN reaction causing immediate rash, facial/tongue/throat swelling, SOB or lightheadedness with hypotension: No Has patient had a PCN reaction causing severe rash involving mucus membranes or skin necrosis: No Has patient had a PCN reaction that required hospitalization No Has patient had a PCN reaction occurring within the last 10 years: No If all of the above answers  are "NO", then may proceed with Cephalosporin use.        Medication List    Advair HFA 115-21 MCG/ACT inhaler Generic drug: fluticasone-salmeterol 2 inhalations 1-2 times per day   albuterol 108 (90 Base) MCG/ACT inhaler Commonly known as: ProAir HFA Inhale 2 puffs into the lungs every 6 (six) hours as needed for wheezing or shortness of breath.   ALPRAZolam 0.5 MG tablet Commonly known as: XANAX as needed.   aspirin EC 81 MG tablet Take 1 tablet (81 mg total) by mouth daily. Swallow whole.   Azelastine-Fluticasone 137-50 MCG/ACT Susp Use one spray in each nostril twice daily   CALCIUM PO Take 1 tablet by mouth daily.   candesartan 16 MG tablet Commonly known as: ATACAND Take 8 mg by mouth at bedtime.   cetirizine 10 MG tablet Commonly known as: ZYRTEC Take 1 tablet (10 mg total) by mouth daily. One tab daily for allergies   diclofenac 50 MG tablet Commonly known as: CATAFLAM Take 50 mg by mouth once as needed (neck pain).   dicyclomine 10 MG capsule Commonly known as: BENTYL Take 1 capsule (10 mg total) by mouth 3 (three) times daily before meals.   famotidine 40 MG tablet Commonly known as: Pepcid Take 1 tablet (40 mg total) by mouth at bedtime.   Lifitegrast 5 % Soln Apply 1 drop to eye 2 (two) times daily.   methocarbamol 500 MG tablet Commonly known as: ROBAXIN TAKE 1 TABLET BY MOUTH DAILY  AS NEEDED FOR MUSCLE SPASMS   montelukast 10 MG tablet Commonly known as: SINGULAIR TAKE 1 TABLET(10 MG) BY MOUTH DAILY   nystatin 100000 UNIT/ML suspension Commonly known as: MYCOSTATIN Swish and swallow with 5 ml twice daily as directed.   OMEPRAZOLE PO Take 1 capsule by mouth daily.   pantoprazole 40 MG tablet Commonly known as: PROTONIX TAKE 1 TABLET(40 MG) BY MOUTH TWICE DAILY   rosuvastatin 20 MG tablet Commonly known as: CRESTOR TAKE 1 TABLET(20 MG) BY MOUTH DAILY   VITAMIN A PO Take by mouth daily.   VITAMIN B-12 PO Take 1 tablet by mouth  daily.   VITAMIN C PO Take 1 tablet by mouth daily.   Vitamin D 50 MCG (2000 UT) tablet Take 2,000 Units by mouth daily.   zolmitriptan 5 MG disintegrating tablet Commonly known as: ZOMIG-ZMT Take 1 tablet (5 mg total) by mouth as needed for migraine.   zolpidem 5 MG tablet Commonly known as: AMBIEN TAKE 1 TABLET(5 MG) BY MOUTH AT BEDTIME AS NEEDED    Past Medical History:  Diagnosis Date   Adenomatous colon polyp 10/1997, and 03/2009   Allergic rhinitis    Allergy    Aortic atherosclerosis (HCC)    Arthritis    osteo   Asthma    Chronic gastritis    Coronary artery calcification seen on CT scan    Fibromyalgia    GERD (gastroesophageal reflux disease)    Hemorrhoids    Hyperlipidemia    Hypertension    Menorrhagia    partial hysterectomy   Migraines    Osteoarthritis    PONV (postoperative nausea and vomiting)    Premature atrial contractions    Sleep disturbance    STD (sexually transmitted disease) 01/22/11 culture proven   HSV type I labia    Past Surgical History:  Procedure Laterality Date   CATARACT EXTRACTION Bilateral 10/16 & 11/16   EXCISION METACARPAL MASS Right 11/16/2016   Procedure: RIGHT LITTLE FINGER CYST EXCISION;  Surgeon: Milly Jakob, MD;  Location: [place];  Service: Orthopedics;  Laterality: Right;   TONSILLECTOMY  age 70   tooth extract     with bone graft   Turbinate sinus surgery  2003   [place]   secondary to prolapse, ovaries remain    Review of systems negative except as noted in HPI / PMHx or noted below:  Review of Systems  Constitutional: Negative.   HENT: Negative.    Eyes: Negative.   Respiratory: Negative.    Cardiovascular: Negative.   Gastrointestinal: Negative.   Genitourinary: Negative.   Musculoskeletal: Negative.   Skin: Negative.   Neurological: Negative.   Endo/Heme/Allergies: Negative.   Psychiatric/Behavioral: Negative.      Objective:   Vitals:   01/07/21 1044   BP: 122/82  Pulse: 79  Resp: 16  SpO2: 99%          Physical Exam Constitutional:      Appearance: She is not diaphoretic.     Comments: Throat clearing  HENT:     Head: Normocephalic.     Right Ear: Tympanic membrane, ear canal and external ear normal.     Left Ear: Tympanic membrane, ear canal and external ear normal.     Nose: Nose normal. No mucosal edema or rhinorrhea.     Mouth/Throat:     Pharynx: Uvula midline. No oropharyngeal exudate.  Eyes:     Conjunctiva/sclera: Conjunctivae normal.  Neck:     Thyroid: No  thyromegaly.     Trachea: Trachea normal. No tracheal tenderness or tracheal deviation.  Cardiovascular:     Rate and Rhythm: Normal rate and regular rhythm.     Heart sounds: Normal heart sounds, S1 normal and S2 normal. No murmur heard. Pulmonary:     Effort: No respiratory distress.     Breath sounds: Normal breath sounds. No stridor. No wheezing or rales.  Lymphadenopathy:     Head:     Right side of head: No tonsillar adenopathy.     Left side of head: No tonsillar adenopathy.     Cervical: No cervical adenopathy.  Skin:    Findings: No erythema or rash.     Nails: There is no clubbing.  Neurological:     Mental Status: She is alert.    Diagnostics:    Spirometry was performed and demonstrated an FEV1 of 1.33 at 71 % of predicted.  Assessment and Plan:   1. Asthma, moderate persistent, well-controlled   2. Other allergic rhinitis   3. LPRD (laryngopharyngeal reflux disease)     1. Continue to treat reflux/LPR:    A. Pantoprazole '40mg'$  twice a day  B. Famotidine 40 mg in evening   C.  Replace throat clearing with swallowing/drinking maneuver  2. Continue to treat and prevent inflammation:   A. Dymista -1 spray each nostril 2 times per day  B. montelukast '10mg'$  -1 time per day  C. Advair 115 HFA - 2 inhalations 1-2 times per day  3. If needed:   A. ProAir HFA  B. Zyrtec  C. Nystatin oral solution  D. nasal saline    4.  Return  to clinic in 6 months or earlier if problem  5. Obtain fall flu vaccine   Overall Elaysia is doing relatively well regarding her respiratory tract issue.  Her LPR is always an active issue and I did have a talk with her today about the need to stop throat clearing as every time she throws clears she is producing an irritation and inflammatory area within her [place].  She will remain on aggressive therapy directed against LPR and anti-inflammatory agents for her airway as noted above and she can use nystatin should she require this agent for recurrent thrush.  I will see her back in this clinic in 6 months or earlier if there is a problem.     Allena Katz, MD Allergy / Immunology [place]

## 2021-01-07 NOTE — Patient Instructions (Signed)
  1. Continue to treat reflux/LPR:    A. Pantoprazole '40mg'$  twice a day  B. Famotidine 40 mg in evening   C.  Replace throat clearing with swallowing/drinking maneuver  2. Continue to treat and prevent inflammation:   A. Dymista -1 spray each nostril 2 times per day  B. montelukast '10mg'$  -1 time per day  C. Advair 115 HFA - 2 inhalations 1-2 times per day  3. If needed:   A. ProAir HFA  B. Zyrtec  C. Nystatin oral solution  D. nasal saline    4.  Return to clinic in 6 months or earlier if problem  5. Obtain fall flu vaccine

## 2021-01-08 ENCOUNTER — Encounter: Payer: Self-pay | Admitting: Allergy and Immunology

## 2021-01-20 ENCOUNTER — Other Ambulatory Visit: Payer: Self-pay | Admitting: Physician Assistant

## 2021-01-20 NOTE — Progress Notes (Signed)
Office Visit Note  Patient: Christina Deleon             Date of Birth: [birth year]           MRN: [id number]             PCP: Wenda Low, MD Referring: Wenda Low, MD Visit Date: 02/03/2021 Occupation: @GUAROCC @  Subjective:  Generalized pain   History of Present Illness: Christina Deleon is a 70 y.o. female with history of osteoarthritis, fibromyalgia, and DDD.  Patient reports that she continues to have generalized myalgias and muscle tenderness due to fibromyalgia.  She states that she has ongoing trapezius muscle tension and tenderness bilaterally.  She experiences muscle spasms on occasion.  She takes methocarbamol 500 mg 1 tablet daily as needed for muscle spasms.  She states that she has been having increased pain and stiffness in both hands and both ankle joints.  She denies any obvious joint swelling.  She experiences nocturnal pain in her lower back.  She takes Ambien 5 mg at bedtime and typically sleeps about 4 hours straight.   Activities of Daily Living:  Patient reports morning stiffness for several hours.   Patient Reports nocturnal pain.  Difficulty dressing/grooming: Reports Difficulty climbing stairs: Reports Difficulty getting out of chair: Reports Difficulty using hands for taps, buttons, cutlery, and/or writing: Reports  Review of Systems  Constitutional:  Positive for fatigue.  HENT:  Positive for mouth dryness and nose dryness. Negative for mouth sores.   Eyes:  Positive for pain, itching and dryness. Negative for visual disturbance.  Respiratory:  Negative for cough, hemoptysis and difficulty breathing.   Cardiovascular:  Negative for chest pain, palpitations, hypertension and swelling in legs/feet.  Gastrointestinal:  Positive for constipation. Negative for blood in stool and diarrhea.  Endocrine: Negative for increased urination.  Genitourinary:  Negative for difficulty urinating and painful urination.  Musculoskeletal:  Positive for joint pain, joint  pain, joint swelling, myalgias, morning stiffness, muscle tenderness and myalgias. Negative for muscle weakness.  Skin:  Negative for color change, pallor, rash, hair loss, nodules/bumps, redness, skin tightness, ulcers and sensitivity to sunlight.  Allergic/Immunologic: Negative for susceptible to infections.  Neurological:  Positive for dizziness, headaches and memory loss. Negative for weakness.  Hematological:  Negative for bruising/bleeding tendency and swollen glands.  Psychiatric/Behavioral:  Negative for depressed mood and sleep disturbance. The patient is not nervous/anxious.    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   Primary osteoarthritis of both knees 04/19/2016   Leg pain 01/22/2016   Restless legs syndrome 01/22/2016   Snoring 11/19/2015   Periodic limb movement sleep disorder 11/19/2015   GERD (gastroesophageal reflux disease) 10/08/2015   Mild persistent asthma 04/09/2015   LPRD (laryngopharyngeal reflux disease) 04/09/2015   Allergic rhinoconjunctivitis 04/09/2015   HEMATOCHEZIA 04/01/2010   COUGH 04/01/2010   GASTRITIS 09/30/2009   NAUSEA 09/30/2009   ABDOMINAL PAIN-EPIGASTRIC 09/30/2009   DYSTHYMIC DISORDER 09/26/2009   PALPITATIONS 06/17/2009   Impingement syndrome of shoulder, left 06/04/2009   CONSTIPATION 02/05/2009   PERSONAL HX COLONIC POLYPS 02/05/2009   ASTHMA, PERSISTENT, MILD 02/04/2009   CHEST PAIN, ATYPICAL 02/04/2009   COLONIC POLYPS, BENIGN 12/18/2008   Hyperlipidemia LDL goal <70 12/18/2008   Insomnia 12/18/2008   MIGRAINE, COMMON 12/18/2008   Essential hypertension 12/18/2008  ALLERGIC RHINITIS 12/18/2008   Primary osteoarthritis of both hands 12/18/2008   DJD (degenerative joint disease), cervical 12/18/2008   NECK PAIN, CHRONIC 12/18/2008   LOW BACK PAIN,  CHRONIC 12/18/2008    Past Medical History:  Diagnosis Date   Adenomatous colon polyp 10/1997, and 03/2009   Allergic rhinitis    Allergy    Aortic atherosclerosis (HCC)    Arthritis    osteo   Asthma    Chronic gastritis    Coronary artery calcification seen on CT scan    Fibromyalgia    GERD (gastroesophageal reflux disease)    Hemorrhoids    Hyperlipidemia    Hypertension    Menorrhagia    partial hysterectomy   Migraines    Osteoarthritis    PONV (postoperative nausea and vomiting)    Premature atrial contractions    Sleep disturbance    STD (sexually transmitted disease) 01/22/11 culture proven   HSV type I labia    Family History  Problem Relation Age of Onset   Throat cancer Father    Coronary artery disease Father    Heart attack Father 62   Stroke Father        during procedure   Coronary artery disease Mother    Hypertension Mother    Hyperlipidemia Mother    Pulmonary fibrosis Mother    Lung disease Mother    Prostate cancer Brother    Heart disease Brother    Diabetes Brother    Diabetes Paternal Aunt        x 2   Diabetes Paternal Grandmother    Coronary artery disease Paternal Grandmother    Stroke Maternal Grandmother        or MI   Prostate cancer Paternal Uncle    Migraines Daughter    Migraines Daughter    Colon cancer Neg Hx    Past Surgical History:  Procedure Laterality Date   CATARACT EXTRACTION Bilateral 10/16 & 11/16   EXCISION METACARPAL MASS Right 11/16/2016   Procedure: RIGHT LITTLE FINGER CYST EXCISION;  Surgeon: Milly Jakob, MD;  Location: [facility];  Service: Orthopedics;  Laterality: Right;   TONSILLECTOMY  age 59   tooth extract     with bone graft   Turbinate sinus surgery  2003   [place]   secondary to prolapse, ovaries remain   Social History   Social History Narrative   She lives with husband.  They have 2 grown children.   She is retired Product/process development scientist.   Highest  level of education:  2 years of college   Right handed   Regular exercise, diet of fruits, veggies, limited fried foods, limited water      [facility]:   Originally from [place]. Has also lived in [place]. Previously worked doing Software engineer. No pets currently. No bird, mold, or hot tub exposure. Does have a musty smell in their home but it is new. Enjoys reading. 1 indoor plant. Has carpet in her home including in the bedroom. Also has draperies.    Right handed   Immunization History  Administered Date(s) Administered   Influenza, Seasonal, Injecte, Preservative Fre 04/09/2015   Influenza,inj,Quad PF,6+ Mos 01/14/2016   PFIZER(Purple Top)SARS-COV-2 Vaccination 06/23/2019, 07/18/2019, 04/04/2020   Pneumococcal Conjugate-13 11/25/2015   Pneumococcal Polysaccharide-23 11/20/2013   Tdap 11/20/2009     Objective: Vital Signs: BP (!) 146/78 (BP Location: Left Arm, Patient Position: Sitting, Cuff Size: Normal)   Pulse 94  Ht 5\' 4"  (1.626 m)   Wt 158 lb 9.6 oz (71.9 kg)   LMP 07/02/1994   BMI 27.22 kg/m    Physical Exam Vitals and nursing note reviewed.  Constitutional:      Appearance: She is well-developed.  HENT:     Head: Normocephalic and atraumatic.  Eyes:     Conjunctiva/sclera: Conjunctivae normal.  Pulmonary:     Effort: Pulmonary effort is normal.  Abdominal:     Palpations: Abdomen is soft.  Musculoskeletal:     Cervical back: Normal range of motion.  Skin:    General: Skin is warm and dry.     Capillary Refill: Capillary refill takes less than 2 seconds.  Neurological:     Mental Status: She is alert and oriented to person, place, and time.  Psychiatric:        Behavior: Behavior normal.     Musculoskeletal Exam: Generalized hyperalgesia and positive tender points on examination.  C-spine has good range of motion with no discomfort.  Some trapezius muscle tension and tenderness bilaterally.  Shoulder joints, elbow joints, wrist  joints, MCPs, PIPs, DIPs have good range of motion with no synovitis.  DIP thickening consistent with osteoarthritis of both hands noted.  Complete fist formation bilaterally.  Hip joints have good range of motion with no discomfort.  Tenderness over bilateral trochanteric bursa.  Knee joints have good range of motion with no warmth or effusion.  Ankle joints have good range of motion with no tenderness or joint swelling.  CDAI Exam: CDAI Score: -- Patient Global: --; Provider Global: -- Swollen: --; Tender: -- Joint Exam 02/03/2021   No joint exam has been documented for this visit   There is currently no information documented on the homunculus. Go to the Rheumatology activity and complete the homunculus joint exam.  Investigation: No additional findings.  Imaging: No results found.  Recent Labs: Lab Results  Component Value Date   WBC 4.1 12/08/2020   HGB 12.5 12/08/2020   PLT 262 12/08/2020   NA 144 12/08/2020   K 4.1 12/08/2020   CL 107 (H) 12/08/2020   CO2 25 12/08/2020   GLUCOSE 101 (H) 12/08/2020   BUN 15 12/08/2020   CREATININE 0.92 12/08/2020   BILITOT <0.2 12/08/2020   ALKPHOS 77 12/08/2020   AST 20 12/08/2020   ALT 13 12/08/2020   PROT 6.5 12/08/2020   ALBUMIN 3.8 12/08/2020   CALCIUM 9.2 12/08/2020   GFRAA 87 08/05/2020    Speciality Comments: No specialty comments available.  Procedures:  No procedures performed Allergies: Trazodone hcl, Clindamycin/lincomycin, and Penicillins   Assessment / Plan:     Visit Diagnoses: Fibromyalgia: She has generalized hyperalgesia and positive tender points on examination.  She continues to experience intermittent myalgias and muscle tenderness.  She is currently having trapezius muscle tension and tenderness bilaterally.  She takes methocarbamol 500 mg 1 tablet daily as needed for muscle spasms.  A refill was sent to the pharmacy today.  She continues to have chronic fatigue secondary to insomnia.  She takes Ambien 5 mg  at bedtime which helps her to sleep for about 4 hours straight.  Discussed the importance of regular exercise and good sleep hygiene.  She will follow-up in the office in 6 months.  Other insomnia -She takes Ambien 5 mg 1 tablet by mouth at bedtime for insomnia.  She sleeps about 4 hours straight after taking her dose of Ambien but after that point has interrupted sleep at night.  Discussed  the importance of good sleep hygiene.  Other fatigue: Chronic and secondary to insomnia.  Discussed the importance of regular exercise.  DDD (degenerative disc disease), cervical -followed by Dr. Louanne Skye.  She had updated x-rays of the C-spine on 10/15/2020 which revealed generalized disc narrowing and minimal anterior listhesis C7-T1.  She has trapezius muscle tension and tenderness bilaterally.  She has been taking methocarbamol 500 mg 1 tablet daily as needed for muscle spasms.  A refill was sent to the pharmacy today.  DDD lumbar spine: She continues to experience intermittent lower back pain and stiffness.  No symptoms of radiculopathy.  Her discomfort is exacerbated by physical activity especially yard work.  She was given a handout of back exercises and core strengthening exercises to perform on a daily basis.  Chronic pain of both shoulders - History of left shoulder impingement.  She has good range of motion of both shoulder joints with no discomfort.  Primary osteoarthritis of both hands: DIP thickening consistent with osteoarthritis of both hands noted.  Complete fist formation bilaterally.  No inflammation was noted on examination today.  She was given a handout of hand exercises to perform.  Discussed the importance of joint protection and muscle strengthening.  Discussed the list of natural anti-inflammatories which she can start taking.  Trochanteric bursitis of both hips: She has tenderness over bilateral trochanteric bursa.  Discussed the importance of performing stretching exercises on a daily  basis.  Primary osteoarthritis of both knees: She has good range of motion of both knee joints on examination today.  No warmth or effusion was noted.  She experiences pain when climbing steps.  We discussed the list of natural anti-inflammatories to start taking.  We also discussed the importance of regular exercise and lower extremity muscle strengthening.  Plantar fasciitis, right: Resolved  Other medical conditions are listed as follows:  History of gastroesophageal reflux (GERD)  History of hypertension  History of asthma  History of hyperlipidemia  History of migraine  History of colon polyps  Orders: No orders of the defined types were placed in this encounter.  Meds ordered this encounter  Medications   methocarbamol (ROBAXIN) 500 MG tablet    Sig: TAKE 1 TABLET BY MOUTH DAILY AS NEEDED FOR MUSCLE SPASMS    Dispense:  90 tablet    Refill:  0       Follow-Up Instructions: Return in about 6 months (around 08/04/2021) for Osteoarthritis, Fibromyalgia, DDD.   Ofilia Neas, PA-C  Note - This record has been created using Dragon software.  Chart creation errors have been sought, but may not always  have been located. Such creation errors do not reflect on  the standard of medical care.

## 2021-01-21 NOTE — Telephone Encounter (Signed)
Next Visit: 02/03/2021   Last Visit: 08/05/2020,    Last Fill: 12/22/2020   Dx: Other insomnia   Current Dose per office note on 08/05/2020, Ambien 5 mg p.o. nightly as needed   Okay to refill Ambien?

## 2021-02-03 ENCOUNTER — Ambulatory Visit: Payer: Medicare Other | Admitting: Physician Assistant

## 2021-02-03 ENCOUNTER — Other Ambulatory Visit: Payer: Self-pay

## 2021-02-03 ENCOUNTER — Encounter: Payer: Self-pay | Admitting: Physician Assistant

## 2021-02-03 VITALS — BP 146/78 | HR 94 | Ht 64.0 in | Wt 158.6 lb

## 2021-02-03 DIAGNOSIS — M722 Plantar fascial fibromatosis: Secondary | ICD-10-CM

## 2021-02-03 DIAGNOSIS — M5030 Other cervical disc degeneration, unspecified cervical region: Secondary | ICD-10-CM

## 2021-02-03 DIAGNOSIS — G8929 Other chronic pain: Secondary | ICD-10-CM

## 2021-02-03 DIAGNOSIS — M47816 Spondylosis without myelopathy or radiculopathy, lumbar region: Secondary | ICD-10-CM

## 2021-02-03 DIAGNOSIS — Z8709 Personal history of other diseases of the respiratory system: Secondary | ICD-10-CM

## 2021-02-03 DIAGNOSIS — Z79899 Other long term (current) drug therapy: Secondary | ICD-10-CM

## 2021-02-03 DIAGNOSIS — M19041 Primary osteoarthritis, right hand: Secondary | ICD-10-CM

## 2021-02-03 DIAGNOSIS — M25511 Pain in right shoulder: Secondary | ICD-10-CM

## 2021-02-03 DIAGNOSIS — G4709 Other insomnia: Secondary | ICD-10-CM | POA: Diagnosis not present

## 2021-02-03 DIAGNOSIS — M25512 Pain in left shoulder: Secondary | ICD-10-CM

## 2021-02-03 DIAGNOSIS — M797 Fibromyalgia: Secondary | ICD-10-CM | POA: Diagnosis not present

## 2021-02-03 DIAGNOSIS — Z8669 Personal history of other diseases of the nervous system and sense organs: Secondary | ICD-10-CM

## 2021-02-03 DIAGNOSIS — M7062 Trochanteric bursitis, left hip: Secondary | ICD-10-CM

## 2021-02-03 DIAGNOSIS — M7061 Trochanteric bursitis, right hip: Secondary | ICD-10-CM

## 2021-02-03 DIAGNOSIS — Z860100 Personal history of colon polyps, unspecified: Secondary | ICD-10-CM

## 2021-02-03 DIAGNOSIS — R5383 Other fatigue: Secondary | ICD-10-CM

## 2021-02-03 DIAGNOSIS — M170 Bilateral primary osteoarthritis of knee: Secondary | ICD-10-CM

## 2021-02-03 DIAGNOSIS — M19042 Primary osteoarthritis, left hand: Secondary | ICD-10-CM

## 2021-02-03 DIAGNOSIS — Z8719 Personal history of other diseases of the digestive system: Secondary | ICD-10-CM

## 2021-02-03 DIAGNOSIS — Z8639 Personal history of other endocrine, nutritional and metabolic disease: Secondary | ICD-10-CM

## 2021-02-03 DIAGNOSIS — Z8679 Personal history of other diseases of the circulatory system: Secondary | ICD-10-CM

## 2021-02-03 MED ORDER — METHOCARBAMOL 500 MG PO TABS: ORAL_TABLET | ORAL | 0 refills | Status: DC

## 2021-02-03 NOTE — Patient Instructions (Addendum)
Hand Exercises Hand exercises can be helpful for almost anyone. These exercises can strengthen the hands, improve flexibility and movement, and increase blood flow to the hands. These results can make work and daily tasks easier. Hand exercises can be especially helpful for people who have joint pain from arthritis or have nerve damage from overuse (carpal tunnel syndrome). These exercises can also help people who have injured a hand. Exercises Most of these hand exercises are gentle stretching and motion exercises. It is usually safe to do them often throughout the day. Warming up your hands before exercise may help to reduce stiffness. You can do this with gentle massage or by placing your hands in warm water for 10-15 minutes. It is normal to feel some stretching, pulling, tightness, or mild discomfort as you begin new exercises. This will gradually improve. Stop an exercise right away if you feel sudden, severe pain or your pain gets worse. Ask your health care provider which exercises are best for you. Knuckle bend or "claw" fist  Stand or sit with your arm, hand, and all five fingers pointed straight up. Make sure to keep your wrist straight during the exercise. Gently bend your fingers down toward your palm until the tips of your fingers are touching the top of your palm. Keep your big knuckle straight and just bend the small knuckles in your fingers. Hold this position for __________ seconds. Straighten (extend) your fingers back to the starting position. Repeat this exercise 5-10 times with each hand. Full finger fist  Stand or sit with your arm, hand, and all five fingers pointed straight up. Make sure to keep your wrist straight during the exercise. Gently bend your fingers into your palm until the tips of your fingers are touching the middle of your palm. Hold this position for __________ seconds. Extend your fingers back to the starting position, stretching every joint fully. Repeat  this exercise 5-10 times with each hand. Straight fist Stand or sit with your arm, hand, and all five fingers pointed straight up. Make sure to keep your wrist straight during the exercise. Gently bend your fingers at the big knuckle, where your fingers meet your hand, and the middle knuckle. Keep the knuckle at the tips of your fingers straight and try to touch the bottom of your palm. Hold this position for __________ seconds. Extend your fingers back to the starting position, stretching every joint fully. Repeat this exercise 5-10 times with each hand. Tabletop  Stand or sit with your arm, hand, and all five fingers pointed straight up. Make sure to keep your wrist straight during the exercise. Gently bend your fingers at the big knuckle, where your fingers meet your hand, as far down as you can while keeping the small knuckles in your fingers straight. Think of forming a tabletop with your fingers. Hold this position for __________ seconds. Extend your fingers back to the starting position, stretching every joint fully. Repeat this exercise 5-10 times with each hand. Finger spread  Place your hand flat on a table with your palm facing down. Make sure your wrist stays straight as you do this exercise. Spread your fingers and thumb apart from each other as far as you can until you feel a gentle stretch. Hold this position for __________ seconds. Bring your fingers and thumb tight together again. Hold this position for __________ seconds. Repeat this exercise 5-10 times with each hand. Making circles  Stand or sit with your arm, hand, and all five fingers pointed   straight up. Make sure to keep your wrist straight during the exercise. Make a circle by touching the tip of your thumb to the tip of your index finger. Hold for __________ seconds. Then open your hand wide. Repeat this motion with your thumb and each finger on your hand. Repeat this exercise 5-10 times with each hand. Thumb  motion  Sit with your forearm resting on a table and your wrist straight. Your thumb should be facing up toward the ceiling. Keep your fingers relaxed as you move your thumb. Lift your thumb up as high as you can toward the ceiling. Hold for __________ seconds. Bend your thumb across your palm as far as you can, reaching the tip of your thumb for the small finger (pinkie) side of your palm. Hold for __________ seconds. Repeat this exercise 5-10 times with each hand. Grip strengthening  Hold a stress ball or other soft ball in the middle of your hand. Slowly increase the pressure, squeezing the ball as much as you can without causing pain. Think of bringing the tips of your fingers into the middle of your palm. All of your finger joints should bend when doing this exercise. Hold your squeeze for __________ seconds, then relax. Repeat this exercise 5-10 times with each hand. Contact a health care provider if: Your hand pain or discomfort gets much worse when you do an exercise. Your hand pain or discomfort does not improve within 2 hours after you exercise. If you have any of these problems, stop doing these exercises right away. Do not do them again unless your health care provider says that you can. Get help right away if: You develop sudden, severe hand pain or swelling. If this happens, stop doing these exercises right away. Do not do them again unless your health care provider says that you can. This information is not intended to replace advice given to you by your health care provider. Make sure you discuss any questions you have with your health care provider. Document Revised: 08/07/2020 Document Reviewed: 08/07/2020 Elsevier Patient Education  [place].  Back Exercises These exercises help to make your trunk and back strong. They also help to keep the lower back flexible. Doing these exercises can help to prevent or lessen pain in your lower back. If you have back pain, try  to do these exercises 2-3 times each day or as told by your doctor. As you get better, do the exercises once each day. Repeat the exercises more often as told by your doctor. To stop back pain from coming back, do the exercises once each day, or as told by your doctor. Do exercises exactly as told by your doctor. Stop right away if you feel sudden pain or your pain gets worse. Exercises Single knee to chest Do these steps 3-5 times in a row for each leg: Lie on your back on a firm bed or the floor with your legs stretched out. Bring one knee to your chest. Grab your knee or thigh with both hands and hold it in place. Pull on your knee until you feel a gentle stretch in your lower back or butt. Keep doing the stretch for 10-30 seconds. Slowly let go of your leg and straighten it. Pelvic tilt Do these steps 5-10 times in a row: Lie on your back on a firm bed or the floor with your legs stretched out. Bend your knees so they point up to the ceiling. Your feet should be flat on the  floor. Tighten your lower belly (abdomen) muscles to press your lower back against the floor. This will make your tailbone point up to the ceiling instead of pointing down to your feet or the floor. Stay in this position for 5-10 seconds while you gently tighten your muscles and breathe evenly. Cat-cow Do these steps until your lower back bends more easily: Get on your hands and knees on a firm bed or the floor. Keep your hands under your shoulders, and keep your knees under your hips. You may put padding under your knees. Let your head hang down toward your chest. Tighten (contract) the muscles in your belly. Point your tailbone toward the floor so your lower back becomes rounded like the back of a cat. Stay in this position for 5 seconds. Slowly lift your head. Let the muscles of your belly relax. Point your tailbone up toward the ceiling so your back forms a sagging arch like the back of a cow. Stay in this  position for 5 seconds.  Press-ups Do these steps 5-10 times in a row: Lie on your belly (face-down) on a firm bed or the floor. Place your hands near your head, about shoulder-width apart. While you keep your back relaxed and keep your hips on the floor, slowly straighten your arms to raise the top half of your body and lift your shoulders. Do not use your back muscles. You may change where you place your hands to make yourself more comfortable. Stay in this position for 5 seconds. Keep your back relaxed. Slowly return to lying flat on the floor.  Bridges Do these steps 10 times in a row: Lie on your back on a firm bed or the floor. Bend your knees so they point up to the ceiling. Your feet should be flat on the floor. Your arms should be flat at your sides, next to your body. Tighten your butt muscles and lift your butt off the floor until your waist is almost as high as your knees. If you do not feel the muscles working in your butt and the back of your thighs, slide your feet 1-2 inches (2.5-5 cm) farther away from your butt. Stay in this position for 3-5 seconds. Slowly lower your butt to the floor, and let your butt muscles relax. If this exercise is too easy, try doing it with your arms crossed over your chest. Belly crunches Do these steps 5-10 times in a row: Lie on your back on a firm bed or the floor with your legs stretched out. Bend your knees so they point up to the ceiling. Your feet should be flat on the floor. Cross your arms over your chest. Tip your chin a little bit toward your chest, but do not bend your neck. Tighten your belly muscles and slowly raise your chest just enough to lift your shoulder blades a tiny bit off the floor. Avoid raising your body higher than that because it can put too much stress on your lower back. Slowly lower your chest and your head to the floor. Back lifts Do these steps 5-10 times in a row: Lie on your belly (face-down) with your arms at  your sides, and rest your forehead on the floor. Tighten the muscles in your legs and your butt. Slowly lift your chest off the floor while you keep your hips on the floor. Keep the back of your head in line with the curve in your back. Look at the floor while you do this. Stay  in this position for 3-5 seconds. Slowly lower your chest and your face to the floor. Contact a doctor if: Your back pain gets a lot worse when you do an exercise. Your back pain does not get better within 2 hours after you exercise. If you have any of these problems, stop doing the exercises. Do not do them again unless your doctor says it is okay. Get help right away if: You have sudden, very bad back pain. If this happens, stop doing the exercises. Do not do them again unless your doctor says it is okay. This information is not intended to replace advice given to you by your health care provider. Make sure you discuss any questions you have with your health care provider. Document Revised: 07/02/2020 Document Reviewed: 07/02/2020 Elsevier Patient Education  [place] Exercises Ask your health care provider which exercises are safe for you. Do exercises exactly as told by your health care provider and adjust them as directed. It is normal to feel mild stretching, pulling, tightness, or discomfort as you do these exercises. Stop right away if you feel sudden pain or your pain gets worse. Do not begin these exercises until told by your health care provider. Benefits of core strength exercises Core exercises help to build strength in the muscles between your ribs and your hips (abdominal muscles). These muscles help to support your body and keep your spine stable. It is important to maintain strength in your core to prevent injury and pain. Some activities, such as yoga and Pilates, can help to strengthen core muscles. You can also strengthen core muscles with exercises at home. It is important to  talk to your health care provider before you start a new exercise routine. Core strength exercises can: Reduce back pain. Help to rebuild strength after a back or spine injury. Help to prevent injury during physical activity, especially injuries to the back, hips, and knees. How to do core strength exercises Repeat these exercises 10-15 times, or until you are tired. Stop if you feel any pain while doing these exercises. Contact your health care provider if your pain continues or gets worse while doing or after doing core strength exercises. For strength exercises that are done on the floor, use a padded yoga mat or an exercise mat. Bridging  Lie on your back on a firm surface with your knees bent and your feet flat on the floor. Raise your hips so that your knees, hips, and shoulders together form a straight line. Do not excessively arch your back. Keep your abdominal muscles tight. Hold this position for 3-5 seconds. Slowly lower your hips to the starting position. Let your muscles relax completely between repetitions. Single-leg bridge Lie on your back on a firm surface with your knees bent and your feet flat on the floor. Raise your hips so that your knees, hips, and shoulders together form a straight line. Do not excessively arch your back. Keep your abdominal muscles tight. Lift one foot off the floor while maintaining alignment in your knees, hips, and shoulders. Then, completely straighten the lifted leg. Hold this position for 3-5 seconds. Put the straight leg back down in the bent position. Slowly lower your hips to the starting position. Repeat these steps using your other leg. Side bridge Lie on your side with your knees bent. Prop yourself up on the elbow that is near the floor. Using your abdominal muscles and the elbow you are propped up on, raise your body  off the floor. Raise your hip so that your shoulder, hip, and foot together form a straight line. Hold this position for  10 seconds. Keep your head and neck raised and away from your shoulder (in their normal, neutral position). Keep your abdominal muscles tight. Slowly lower your hip to the starting position. Repeat and try to hold this position longer, working your way up to 30 seconds. Abdominal crunch Lie on your back on a firm surface. Bend your knees and keep your feet flat on the floor. Cross your arms over your chest. Without bending your neck, tip your chin slightly toward your chest. Tighten your abdominal muscles as you lift your chest just high enough to lift your shoulder blades off the floor. Do not hold your breath. You can do this with short lifts or long lifts. Slowly return to the starting position. Bird dog  Get on your hands and knees, with your legs shoulder-width apart and your arms under your shoulders. Keep your back straight. Tighten your abdominal muscles. Raise one of your legs off the floor and straighten it. Try to keep it parallel to the floor. Slowly lower your leg to the starting position. Raise one of your arms off the floor and straighten it. Try to keep it parallel to the floor. Slowly lower your arm to the starting position. Repeat with the other arm and leg. If possible, try raising a leg and an arm at the same time, on opposite sides of the body. For example, raise your left hand and your right leg. Rosilyn Mings on your belly. Prop up your body onto your forearms and your feet, keeping your legs straight. Your body should make a straight line between your shoulders and feet. Hold this position for 10 seconds while keeping your abdominal muscles tight. Lower your body to the starting position. Repeat and try to hold this position longer, working your way up to 30 seconds. Cross-core strengthening Stand with your feet shoulder-width apart. Hold a ball out in front of you. Keep your arms straight. Tighten your abdominal muscles and slowly rotate at your waist from side to  side. Keep your feet flat. Once you are comfortable, try repeating this exercise with a heavier ball. Top core strengthening Stand about 18 inches (46 cm) out from a wall, with your back to the wall. Keep your feet flat and shoulder-width apart. Tighten your abdominal muscles. Bend your hips and knees. Slowly reach between your legs to touch the wall behind you. Slowly stand back up. Raise your arms over your head and reach behind you. Return to the starting position. General tips Do not do any exercises that cause pain. If you have pain while exercising, talk to your health care provider. Always stretch before and after doing these exercises. This can help prevent injury. Maintain a healthy weight. Ask your health care provider what weight is healthy for you. Contact a health care provider if: You have back pain that gets worse or does not go away. You feel pain while doing core strength exercises. Get help right away if: You have severe pain that does not get better with medicine. Summary Core exercises help to build strength in the muscles between your ribs and your waist. Core muscles help to support your body and keep your spine stable. Some activities, such as yoga and Pilates, can help to strengthen core muscles. Core strength exercises can help back pain and can prevent injury. Stop if you feel any pain while doing  core strength exercises. This information is not intended to replace advice given to you by your health care provider. Make sure you discuss any questions you have with your health care provider. Document Revised: 01/15/2020 Document Reviewed: 01/15/2020 Elsevier Patient Education  2022 [employer].

## 2021-02-09 ENCOUNTER — Other Ambulatory Visit: Payer: Self-pay | Admitting: Allergy and Immunology

## 2021-02-13 ENCOUNTER — Other Ambulatory Visit: Payer: Self-pay | Admitting: Rheumatology

## 2021-02-13 ENCOUNTER — Other Ambulatory Visit: Payer: Self-pay | Admitting: Allergy and Immunology

## 2021-02-13 NOTE — Telephone Encounter (Signed)
Next Visit: 08/04/2021  Last Visit: 02/03/2021  Last Fill:01/21/2021  Dx: Other insomnia  Current Dose per office note on 02/03/2021, Ambien 5 mg 1 tablet by mouth at bedtime for insomnia:   Okay to refill Ambien?

## 2021-02-16 ENCOUNTER — Telehealth: Payer: Self-pay | Admitting: Allergy and Immunology

## 2021-02-16 ENCOUNTER — Other Ambulatory Visit: Payer: Self-pay

## 2021-02-16 MED ORDER — AZELASTINE-FLUTICASONE 137-50 MCG/ACT NA SUSP: NASAL | 5 refills | Status: DC

## 2021-02-16 MED ORDER — ZOLPIDEM TARTRATE 5 MG PO TABS: 5.0000 mg | ORAL_TABLET | Freq: Every evening | ORAL | 0 refills | Status: DC | PRN

## 2021-02-16 NOTE — Telephone Encounter (Signed)
According to patient's chart the prescription was sent on 02/13/2021. Contacted the pharmacy and spoke with Ronalee Belts who states they have not received the Ambien prescription. CAn you please resend the prescription. Thanks!

## 2021-02-16 NOTE — Telephone Encounter (Signed)
Patient called stating she requested a prescription refill of Zolpidem last week and when she spoke with the pharmacist this morning was told they haven't received the prescription.

## 2021-02-16 NOTE — Telephone Encounter (Signed)
Refill has been sent in to [facility] on [address]

## 2021-02-16 NOTE — Telephone Encounter (Signed)
Patient is requesting a refill on Azelastine sent to [facility] on [address].

## 2021-02-18 ENCOUNTER — Telehealth: Payer: Self-pay | Admitting: *Deleted

## 2021-02-18 MED ORDER — AZELASTINE HCL 0.1 % NA SOLN: 1.0000 | Freq: Two times a day (BID) | NASAL | 5 refills | Status: DC | PRN

## 2021-02-18 MED ORDER — FLUTICASONE PROPIONATE 50 MCG/ACT NA SUSP: 2.0000 | Freq: Every day | NASAL | 5 refills | Status: DC | PRN

## 2021-02-18 NOTE — Telephone Encounter (Signed)
Received pa request for Dymista- stating that brand or generic are not covered. I will send in split dose of fluticasone and Azelastine separately.

## 2021-02-19 ENCOUNTER — Ambulatory Visit
Admission: RE | Admit: 2021-02-19 | Discharge: 2021-02-19 | Disposition: A | Discharge status: Home / Self Care | Payer: Medicare Other | Source: Ambulatory Visit | Attending: Internal Medicine | Admitting: Internal Medicine

## 2021-02-19 ENCOUNTER — Other Ambulatory Visit: Payer: Self-pay | Admitting: Internal Medicine

## 2021-02-19 DIAGNOSIS — M940 Chondrocostal junction syndrome [Tietze]: Secondary | ICD-10-CM

## 2021-03-19 ENCOUNTER — Other Ambulatory Visit: Payer: Self-pay | Admitting: Physician Assistant

## 2021-03-19 ENCOUNTER — Other Ambulatory Visit: Payer: Self-pay

## 2021-03-19 MED ORDER — ROSUVASTATIN CALCIUM 20 MG PO TABS: ORAL_TABLET | ORAL | 2 refills | Status: DC

## 2021-03-19 NOTE — Telephone Encounter (Signed)
Next Visit: 08/04/2021   Last Visit: 02/03/2021   Last Fill: 02/16/2021   Dx: Other insomnia   Current Dose per office note on 02/03/2021, Ambien 5 mg 1 tablet by mouth at bedtime for insomnia:    Okay to refill Ambien?

## 2021-04-06 ENCOUNTER — Ambulatory Visit: Payer: Medicare Other | Admitting: Neurology

## 2021-04-20 ENCOUNTER — Other Ambulatory Visit: Payer: Self-pay | Admitting: Rheumatology

## 2021-04-21 NOTE — Telephone Encounter (Signed)
Next Visit: 08/04/2021   Last Visit: 02/03/2021   Last Fill: 03/19/2021   Dx: Other insomnia   Current Dose per office note on 02/03/2021, Ambien 5 mg 1 tablet by mouth at bedtime for insomnia:    Okay to refill Ambien?

## 2021-05-05 NOTE — Progress Notes (Signed)
Follow-up Visit   Date: 05/06/21   Christina Deleon MRN: [id number] DOB: 1950-09-22   Interim History: Christina Deleon is a 71 y.o. right-handed female with GERD, hypertension, hyperlipidemia, and fibromyalgia returning to the clinic for follow-up of chronic migraine and periodic limb movements.  The patient was accompanied to the clinic by self.  Overall, she is doing fairly well. She was referred for PT, but did not start this. She saw Dr. Louanne Skye who started her on a medication (she does not recall the name) which has helped.  Migraines are stable and continue to be triggered by neck pain and stress. She takes zomig 2.5-5mg  about once per week, which helps her migraines. Her mother passed away about two weeks ago.  No new complaints.   Medications:  Current Outpatient Medications on File Prior to Visit  Medication Sig Dispense Refill   albuterol (PROAIR HFA) 108 (90 Base) MCG/ACT inhaler Inhale 2 puffs into the lungs every 6 (six) hours as needed for wheezing or shortness of breath. 18 g 1   ALPRAZolam (XANAX) 0.5 MG tablet as needed.     Ascorbic Acid (VITAMIN C PO) Take 1 tablet by mouth daily.     aspirin EC 81 MG tablet Take 1 tablet (81 mg total) by mouth daily. Swallow whole. 90 tablet 3   azelastine (ASTELIN) 0.1 % nasal spray Place 1-2 sprays into both nostrils 2 (two) times daily as needed. 30 mL 5   Azelastine-Fluticasone 137-50 MCG/ACT SUSP SHAKE LIQUID AND USE 1 SPRAY IN EACH NOSTRIL TWICE DAILY 23 g 5   CALCIUM PO Take 1 tablet by mouth daily.     candesartan (ATACAND) 16 MG tablet Take 8 mg by mouth at bedtime.     cetirizine (ZYRTEC) 10 MG tablet Take 1 tablet (10 mg total) by mouth daily. One tab daily for allergies 30 tablet 1   Cholecalciferol (VITAMIN D) 2000 units tablet Take 2,000 Units by mouth daily.     Cyanocobalamin (VITAMIN B-12 PO) Take 1 tablet by mouth daily.     diclofenac (CATAFLAM) 50 MG tablet Take 50 mg by mouth once as needed (neck pain).      dicyclomine (BENTYL) 10 MG capsule Take 1 capsule (10 mg total) by mouth 3 (three) times daily before meals. 90 capsule 11   famotidine (PEPCID) 40 MG tablet Take 1 tablet (40 mg total) by mouth at bedtime. 90 tablet 1   fluticasone (FLONASE) 50 MCG/ACT nasal spray Place 2 sprays into both nostrils daily as needed for allergies or rhinitis. 16 mL 5   fluticasone-salmeterol (ADVAIR HFA) 115-21 MCG/ACT inhaler 2 inhalations 1-2 times per day 12 g 5   Lifitegrast 5 % SOLN Apply 1 drop to eye 2 (two) times daily.     methocarbamol (ROBAXIN) 500 MG tablet TAKE 1 TABLET BY MOUTH DAILY AS NEEDED FOR MUSCLE SPASMS 90 tablet 0   montelukast (SINGULAIR) 10 MG tablet TAKE 1 TABLET(10 MG) BY MOUTH DAILY 90 tablet 1   nystatin (MYCOSTATIN) 100000 UNIT/ML suspension Swish and swallow with 5 ml twice daily as directed. 300 mL 5   OMEPRAZOLE PO Take 1 capsule by mouth daily.     pantoprazole (PROTONIX) 40 MG tablet TAKE 1 TABLET(40 MG) BY MOUTH TWICE DAILY 180 tablet 1   rosuvastatin (CRESTOR) 20 MG tablet TAKE 1 TABLET(20 MG) BY MOUTH DAILY 90 tablet 2   VITAMIN A PO Take by mouth daily.     zolpidem (AMBIEN) 5 MG tablet TAKE 1  TABLET(5 MG) BY MOUTH AT BEDTIME AS NEEDED FOR SLEEP 30 tablet 0   No current facility-administered medications on file prior to visit.    Allergies:  Allergies  Allergen Reactions   Trazodone Hcl Other (See Comments)    REACTION: bad headaches   Clindamycin/Lincomycin Other (See Comments)    headaches   Penicillins Nausea Only and Rash    Patient has taken Amoxicillin without an issue, per patient. Has patient had a PCN reaction causing immediate rash, facial/tongue/throat swelling, SOB or lightheadedness with hypotension: No Has patient had a PCN reaction causing severe rash involving mucus membranes or skin necrosis: No Has patient had a PCN reaction that required hospitalization No Has patient had a PCN reaction occurring within the last 10 years: No If all of the above  answers are "NO", then may proceed with Cephalosporin use.     Vital Signs:  BP 135/81    Pulse 85    Ht 5\' 4"  (1.626 m)    Wt 158 lb (71.7 kg)    LMP 07/02/1994    SpO2 99%    BMI 27.12 kg/m   Neurological Exam: MENTAL STATUS including orientation to time, place, person, recent and remote memory, attention span and concentration, language, and fund of knowledge is normal.  Speech is not dysarthric.  CRANIAL NERVES:   Pupils round and reactive bilaterally. No ptosis. Extraocular muscles intact.   MOTOR:  Motor strength is 5/5 in all extremities. Neck ROM is limited with side-to-side rotation, especially on the left.  Neck strength is 5/5.  No atrophy, fasciculations or abnormal movements.    MSRs:  Reflexes are 2+/4 throughout  SENSATION:  Vibration intact throughout.  COORDINATION/GAIT:   Gait narrow based and stable.   Data:  MRI/A brain 10/01/2020:   Unremarkable MRI and MR angiogram of the brain.  IMPRESSION/PLAN: Cervicalgia, improved.  - Recommend neck PT, if symptoms get worse  Episodic migraine, triggered by stress. MRI/A brain is normal  - Continue zomig 2.5mg  - 5mg  limit to twice per week  3.  Periodic limb movements of sleep, stable  -Continue iron supplements and Robaxin as needed  Return to clinic in 1 year  Thank you for allowing me to participate in patient's care.  If I can answer any additional questions, I would be pleased to do so.    Sincerely,    Juluis Fitzsimmons K. Posey Pronto, DO

## 2021-05-06 ENCOUNTER — Encounter: Payer: Self-pay | Admitting: Neurology

## 2021-05-06 ENCOUNTER — Other Ambulatory Visit: Payer: Self-pay

## 2021-05-06 ENCOUNTER — Ambulatory Visit: Payer: Medicare Other | Admitting: Neurology

## 2021-05-06 VITALS — BP 135/81 | HR 85 | Ht 64.0 in | Wt 158.0 lb

## 2021-05-06 DIAGNOSIS — G43009 Migraine without aura, not intractable, without status migrainosus: Secondary | ICD-10-CM | POA: Diagnosis not present

## 2021-05-06 DIAGNOSIS — G4761 Periodic limb movement disorder: Secondary | ICD-10-CM

## 2021-05-06 DIAGNOSIS — M542 Cervicalgia: Secondary | ICD-10-CM | POA: Diagnosis not present

## 2021-05-06 MED ORDER — ZOLMITRIPTAN 5 MG PO TBDP: 5.0000 mg | ORAL_TABLET | ORAL | 11 refills | Status: DC | PRN

## 2021-05-06 NOTE — Patient Instructions (Signed)
Return to clinic in 1 year.

## 2021-05-15 ENCOUNTER — Other Ambulatory Visit: Payer: Self-pay

## 2021-05-15 ENCOUNTER — Encounter: Payer: Self-pay | Admitting: Specialist

## 2021-05-15 ENCOUNTER — Ambulatory Visit: Payer: Medicare Other | Admitting: Specialist

## 2021-05-15 VITALS — BP 137/82 | HR 81 | Ht 64.0 in | Wt 158.0 lb

## 2021-05-15 DIAGNOSIS — M5481 Occipital neuralgia: Secondary | ICD-10-CM

## 2021-05-15 DIAGNOSIS — G4486 Cervicogenic headache: Secondary | ICD-10-CM

## 2021-05-15 DIAGNOSIS — Z791 Long term (current) use of non-steroidal anti-inflammatories (NSAID): Secondary | ICD-10-CM

## 2021-05-15 DIAGNOSIS — M542 Cervicalgia: Secondary | ICD-10-CM

## 2021-05-15 DIAGNOSIS — M47812 Spondylosis without myelopathy or radiculopathy, cervical region: Secondary | ICD-10-CM

## 2021-05-15 MED ORDER — DICLOFENAC SODIUM 1 % EX GEL: 2.0000 g | Freq: Four times a day (QID) | CUTANEOUS | 3 refills | Status: DC

## 2021-05-15 NOTE — Progress Notes (Signed)
Office Visit Note   Patient: Christina Deleon           Date of Birth: October 31, 1950           MRN: [id number] Visit Date: 05/15/2021              Requested by: Wenda Low, MD [address] [place],  [place] [address] PCP: Wenda Low, MD   Assessment & Plan: Visit Diagnoses:  1. Spondylosis without myelopathy or radiculopathy, cervical region   2. Osteoarthritis of cervical spine, unspecified spinal osteoarthritis complication status   3. Headache, cervicogenic   4. Cervicalgia   5. NSAID long-term use     Plan: Avoid overhead lifting and overhead use of the arms. Do not lift greater than 5 lbs. Adjust head rest in vehicle to prevent hyperextension if rear ended. Take extra precautions to avoid falling.your current symptoms or signs.  Diclofenac in form of cataflam for neck pain and arthrosis, and transdermal local diclofenac Gel for nerve and muscle pain local posterior neck and scalp.   Follow-Up Instructions: No follow-ups on file.   Orders:  No orders of the defined types were placed in this encounter.  No orders of the defined types were placed in this encounter.     Procedures: No procedures performed   Clinical Data: No additional findings.   Subjective: Chief Complaint  Patient presents with   Neck - Follow-up    71 year old right handed female with posterior neck pain and bilateral shoulder pain. She has pain when first up in the AM with bilateral occipital pain. She has good and bad days. Has finger stiffness and leg stiffness. "Everything is stiff, ankle, feet and hips and neck"  Review of Systems  Constitutional: Negative.   HENT: Negative.    Eyes: Negative.   Respiratory: Negative.    Cardiovascular: Negative.   Gastrointestinal: Negative.   Endocrine: Negative.   Genitourinary: Negative.   Musculoskeletal: Negative.   Skin: Negative.   Allergic/Immunologic: Negative.   Neurological: Negative.   Hematological: Negative.    Psychiatric/Behavioral: Negative.      Objective: Vital Signs: BP 137/82 (BP Location: Left Arm, Patient Position: Sitting)   Pulse 81   Ht 5\' 4"  (1.626 m)   Wt 158 lb (71.7 kg)   LMP 07/02/1994   BMI 27.12 kg/m   Physical Exam Constitutional:      Appearance: She is well-developed.  HENT:     Head: Normocephalic and atraumatic.  Eyes:     Pupils: Pupils are equal, round, and reactive to light.  Pulmonary:     Effort: Pulmonary effort is normal.     Breath sounds: Normal breath sounds.  Abdominal:     General: Bowel sounds are normal.     Palpations: Abdomen is soft.  Musculoskeletal:     Cervical back: Normal range of motion and neck supple.     Lumbar back: Negative right straight leg raise test and negative left straight leg raise test.  Skin:    General: Skin is warm and dry.  Neurological:     Mental Status: She is alert and oriented to person, place, and time.  Psychiatric:        Behavior: Behavior normal.        Thought Content: Thought content normal.        Judgment: Judgment normal.   Back Exam   Tenderness  The patient is experiencing tenderness in the cervical.  Range of Motion  Extension:  abnormal  Flexion:  abnormal  Lateral bend right:  abnormal  Lateral bend left:  abnormal  Rotation right:  abnormal  Rotation left:  abnormal   Muscle Strength  Right Quadriceps:  5/5  Left Quadriceps:  5/5  Right Hamstrings:  5/5  Left Hamstrings:  5/5   Tests  Straight leg raise right: negative Straight leg raise left: negative  Other  Toe walk: normal Heel walk: normal Erythema: no back redness Scars: absent    Specialty Comments:  No specialty comments available.  Imaging: No results found.   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   Primary osteoarthritis of  both knees 04/19/2016   Leg pain 01/22/2016   Restless legs syndrome 01/22/2016   Snoring 11/19/2015   Periodic limb movement sleep disorder 11/19/2015   GERD (gastroesophageal reflux disease) 10/08/2015   Mild persistent asthma 04/09/2015   LPRD (laryngopharyngeal reflux disease) 04/09/2015   Allergic rhinoconjunctivitis 04/09/2015   HEMATOCHEZIA 04/01/2010   COUGH 04/01/2010   GASTRITIS 09/30/2009   NAUSEA 09/30/2009   ABDOMINAL PAIN-EPIGASTRIC 09/30/2009   DYSTHYMIC DISORDER 09/26/2009   PALPITATIONS 06/17/2009   Impingement syndrome of shoulder, left 06/04/2009   CONSTIPATION 02/05/2009   PERSONAL HX COLONIC POLYPS 02/05/2009   ASTHMA, PERSISTENT, MILD 02/04/2009   CHEST PAIN, ATYPICAL 02/04/2009   COLONIC POLYPS, BENIGN 12/18/2008   Hyperlipidemia LDL goal <70 12/18/2008   Insomnia 12/18/2008   MIGRAINE, COMMON 12/18/2008   Essential hypertension 12/18/2008   ALLERGIC RHINITIS 12/18/2008   Primary osteoarthritis of both hands 12/18/2008   DJD (degenerative joint disease), cervical 12/18/2008   NECK PAIN, CHRONIC 12/18/2008   LOW BACK PAIN, CHRONIC 12/18/2008   Past Medical History:  Diagnosis Date   Adenomatous colon polyp 10/1997, and 03/2009   Allergic rhinitis    Allergy    Aortic atherosclerosis (HCC)    Arthritis    osteo   Asthma    Chronic gastritis    Coronary artery calcification seen on CT scan    Fibromyalgia    GERD (gastroesophageal reflux disease)    Hemorrhoids    Hyperlipidemia    Hypertension    Menorrhagia    partial hysterectomy   Migraines    Osteoarthritis    PONV (postoperative nausea and vomiting)    Premature atrial contractions    Sleep disturbance    STD (sexually transmitted disease) 01/22/11 culture proven   HSV type I labia    Family History  Problem Relation Age of Onset   Throat cancer Father    Coronary artery disease Father    Heart attack Father 69   Stroke Father        during procedure   Coronary artery disease  Mother    Hypertension Mother    Hyperlipidemia Mother    Pulmonary fibrosis Mother    Lung disease Mother    Prostate cancer Brother    Heart disease Brother    Diabetes Brother    Diabetes Paternal Aunt        x 2   Diabetes Paternal Grandmother    Coronary artery disease Paternal Grandmother    Stroke Maternal Grandmother        or MI   Prostate cancer Paternal Uncle    Migraines Daughter    Migraines Daughter    Colon cancer Neg Hx  Past Surgical History:  Procedure Laterality Date   CATARACT EXTRACTION Bilateral 10/16 & 11/16   EXCISION METACARPAL MASS Right 11/16/2016   Procedure: RIGHT LITTLE FINGER CYST EXCISION;  Surgeon: Milly Jakob, MD;  Location: [facility];  Service: Orthopedics;  Laterality: Right;   TONSILLECTOMY  age 62   tooth extract     with bone graft   Turbinate sinus surgery  2003   [place]   secondary to prolapse, ovaries remain   Social History   Occupational History   Occupation: Retired    Comment:  Metallurgist unemployed sincesince 6/10  Tobacco Use   Smoking status: Never   Smokeless tobacco: Never   Tobacco comments:    Father growing up  Vaping Use   Vaping Use: Never used  Substance and Sexual Activity   Alcohol use: No    Alcohol/week: 0.0 standard drinks   Drug use: Never   Sexual activity: Yes    Partners: Male    Birth control/protection: Surgical    Comment: hysterectomy

## 2021-05-15 NOTE — Patient Instructions (Signed)
°  Plan: Avoid overhead lifting and overhead use of the arms. Do not lift greater than 5 lbs. Adjust head rest in vehicle to prevent hyperextension if rear ended. Take extra precautions to avoid falling.your current symptoms or signs.  Diclofenac in form of cataflam for neck pain and arthrosis, and transdermal local diclofenac Gel for nerve and muscle pain local posterior neck and scalp.

## 2021-05-19 NOTE — Progress Notes (Signed)
71 y.o. G79P2002 Married Black or [place] American Not Hispanic or Latino female here for annual exam.  H/O hysterectomy. H/O genital prolapse. In 11/21 she was noted to have a grade 2 cystocele, a grade 2 rectocele, and a grade 1 vault prolapse. She is aware of it, but it doesn't bother her.  She has chronic constipation. She uses stool softeners. Using senakot every night. Having a BM every other day, stools can be normal or hard, sometimes needs to strain. She hasn't tried try reducing the rectocele to help with defecation.  H/O urge incontinence with a full bladder, not problematic.     Occasionally sexually active, no pain.   Her Mom died last month at 38.   Patient's last menstrual period was 07/02/1994.          Sexually active: No.  The current method of family planning is status post hysterectomy.    Exercising: No.  The patient does not participate in regular exercise at present. Smoker:  no  Health Maintenance: Pap:  10/01/08 patient unsure  History of abnormal Pap:  no MMG:  She reports having a normal in the spring 2022. 03/01/17 Bi-rads 1 neg  BMD:   2022, patient states normal.  Colonoscopy: 09/17/20 polyps f/u 3 years  TDaP:  11/28/09  Gardasil: none    reports that she has never smoked. She has never used smokeless tobacco. She reports that she does not drink alcohol and does not use drugs. 2 grown children, one in [place] and one in [place]. 2 step children.  4 grand children,  9 great grand children (between her and her husband). First husband died of CLL, he was her first love, [place] vet. Married to her second husband for 17 years.   Past Medical History:  Diagnosis Date   Adenomatous colon polyp 10/1997, and 03/2009   Allergic rhinitis    Allergy    Aortic atherosclerosis (HCC)    Arthritis    osteo   Asthma    Chronic gastritis    Coronary artery calcification seen on CT scan    Fibromyalgia    GERD (gastroesophageal reflux disease)    Hemorrhoids     Hyperlipidemia    Hypertension    Menorrhagia    partial hysterectomy   Migraines    Osteoarthritis    PONV (postoperative nausea and vomiting)    Premature atrial contractions    Sleep disturbance    STD (sexually transmitted disease) 01/22/11 culture proven   HSV type I labia    Past Surgical History:  Procedure Laterality Date   CATARACT EXTRACTION Bilateral 10/16 & 11/16   EXCISION METACARPAL MASS Right 11/16/2016   Procedure: RIGHT LITTLE FINGER CYST EXCISION;  Surgeon: Milly Jakob, MD;  Location: [facility];  Service: Orthopedics;  Laterality: Right;   TONSILLECTOMY  age 89   tooth extract     with bone graft   Turbinate sinus surgery  2003   [place]   secondary to prolapse, ovaries remain    Current Outpatient Medications  Medication Sig Dispense Refill   albuterol (PROAIR HFA) 108 (90 Base) MCG/ACT inhaler Inhale 2 puffs into the lungs every 6 (six) hours as needed for wheezing or shortness of breath. 18 g 1   ALPRAZolam (XANAX) 0.5 MG tablet as needed.     Ascorbic Acid (VITAMIN C PO) Take 1 tablet by mouth daily.     aspirin EC 81 MG tablet Take 1 tablet (81 mg total) by mouth  daily. Swallow whole. 90 tablet 3   Azelastine-Fluticasone 137-50 MCG/ACT SUSP SHAKE LIQUID AND USE 1 SPRAY IN EACH NOSTRIL TWICE DAILY 23 g 5   CALCIUM PO Take 1 tablet by mouth daily.     candesartan (ATACAND) 16 MG tablet Take 8 mg by mouth at bedtime.     cetirizine (ZYRTEC) 10 MG tablet Take 1 tablet (10 mg total) by mouth daily. One tab daily for allergies 30 tablet 1   Cholecalciferol (VITAMIN D) 2000 units tablet Take 2,000 Units by mouth daily.     Cyanocobalamin (VITAMIN B-12 PO) Take 1 tablet by mouth daily.     diclofenac (CATAFLAM) 50 MG tablet Take 50 mg by mouth once as needed (neck pain).     diclofenac Sodium (VOLTAREN) 1 % GEL Apply 2 g topically 4 (four) times daily. 350 g 3   dicyclomine (BENTYL) 10 MG capsule Take 1 capsule (10 mg total)  by mouth 3 (three) times daily before meals. 90 capsule 11   famotidine (PEPCID) 40 MG tablet Take 1 tablet (40 mg total) by mouth at bedtime. 90 tablet 1   fluticasone-salmeterol (ADVAIR HFA) 115-21 MCG/ACT inhaler 2 inhalations 1-2 times per day 12 g 5   Lifitegrast 5 % SOLN Apply 1 drop to eye 2 (two) times daily.     methocarbamol (ROBAXIN) 500 MG tablet TAKE 1 TABLET BY MOUTH DAILY AS NEEDED FOR MUSCLE SPASMS 90 tablet 0   montelukast (SINGULAIR) 10 MG tablet TAKE 1 TABLET(10 MG) BY MOUTH DAILY 90 tablet 1   nystatin (MYCOSTATIN) 100000 UNIT/ML suspension Swish and swallow with 5 ml twice daily as directed. 300 mL 5   OMEPRAZOLE PO Take 1 capsule by mouth daily.     pantoprazole (PROTONIX) 40 MG tablet TAKE 1 TABLET(40 MG) BY MOUTH TWICE DAILY 180 tablet 1   rosuvastatin (CRESTOR) 20 MG tablet TAKE 1 TABLET(20 MG) BY MOUTH DAILY 90 tablet 2   VITAMIN A PO Take by mouth daily.     zolmitriptan (ZOMIG-ZMT) 5 MG disintegrating tablet Take 1 tablet (5 mg total) by mouth as needed for migraine. 10 tablet 11   zolpidem (AMBIEN) 5 MG tablet TAKE 1 TABLET(5 MG) BY MOUTH AT BEDTIME AS NEEDED FOR SLEEP 30 tablet 0   No current facility-administered medications for this visit.    Family History  Problem Relation Age of Onset   Throat cancer Father    Coronary artery disease Father    Heart attack Father 58   Stroke Father        during procedure   Coronary artery disease Mother    Hypertension Mother    Hyperlipidemia Mother    Pulmonary fibrosis Mother    Lung disease Mother    Prostate cancer Brother    Heart disease Brother    Diabetes Brother    Diabetes Paternal Aunt        x 2   Diabetes Paternal Grandmother    Coronary artery disease Paternal Grandmother    Stroke Maternal Grandmother        or MI   Prostate cancer Paternal Uncle    Migraines Daughter    Migraines Daughter    Colon cancer Neg Hx     Review of Systems  All other systems reviewed and are negative.  Exam:    BP 118/70    Pulse 78    Ht 5\' 4"  (1.626 m)    Wt 155 lb (70.3 kg)    LMP 07/02/1994  SpO2 99%    BMI 26.61 kg/m   Weight change: @WEIGHTCHANGE @ Height:   Height: 5\' 4"  (162.6 cm)  Ht Readings from Last 3 Encounters:  05/21/21 5\' 4"  (1.626 m)  05/15/21 5\' 4"  (1.626 m)  05/06/21 5\' 4"  (1.626 m)    General appearance: alert, cooperative and appears stated age Head: Normocephalic, without obvious abnormality, atraumatic Neck: no adenopathy, supple, symmetrical, trachea midline and thyroid normal to inspection and palpation Breasts: normal appearance, no masses or tenderness Abdomen: soft, non-tender; non distended,  no masses,  no organomegaly Extremities: extremities normal, atraumatic, no cyanosis or edema Skin: Skin color, texture, turgor normal. No rashes or lesions Lymph nodes: Cervical, supraclavicular, and axillary nodes normal. No abnormal inguinal nodes palpated Neurologic: Grossly normal   Pelvic: External genitalia:  no lesions              Urethra:  normal appearing urethra with no masses, tenderness or lesions              Bartholins and Skenes: normal                 Vagina: mildly atrophic appearing vagina with normal color and discharge, no lesions. Grade 2 cystocele and rectocele, grade 1 vault prolapse (examined supine with and without valsalva)              Cervix: absent               Bimanual Exam:  Uterus:  uterus absent              Adnexa: no mass, fullness, tenderness               Rectovaginal: Confirms               Anus:  normal sphincter tone, no lesions  Karmen Bongo, RN chaperoned for the exam.     1. Encounter for gynecological examination with abnormal finding Stable genital prolapse She reports that her mammogram, colonoscopy and DEXA are UTD Labs with primary No pap needed  2. Cystocele, midline Try to avoid heavy lifting and constipation Call if bothersome  3. Rectocele Try to avoid heavy lifting and constipaiton Call if  bothersome Discussed reducing the rectocele if needed to have a BM  Counseled about genital prolapse. Information given in [place]

## 2021-05-21 ENCOUNTER — Other Ambulatory Visit: Payer: Self-pay | Admitting: Rheumatology

## 2021-05-21 ENCOUNTER — Ambulatory Visit (INDEPENDENT_AMBULATORY_CARE_PROVIDER_SITE_OTHER): Payer: Medicare Other | Admitting: Obstetrics and Gynecology

## 2021-05-21 ENCOUNTER — Other Ambulatory Visit: Payer: Self-pay

## 2021-05-21 ENCOUNTER — Encounter: Payer: Self-pay | Admitting: Obstetrics and Gynecology

## 2021-05-21 VITALS — BP 118/70 | HR 78 | Ht 64.0 in | Wt 155.0 lb

## 2021-05-21 DIAGNOSIS — I700 Atherosclerosis of aorta: Secondary | ICD-10-CM | POA: Insufficient documentation

## 2021-05-21 DIAGNOSIS — F411 Generalized anxiety disorder: Secondary | ICD-10-CM | POA: Insufficient documentation

## 2021-05-21 DIAGNOSIS — K644 Residual hemorrhoidal skin tags: Secondary | ICD-10-CM | POA: Insufficient documentation

## 2021-05-21 DIAGNOSIS — F419 Anxiety disorder, unspecified: Secondary | ICD-10-CM | POA: Insufficient documentation

## 2021-05-21 DIAGNOSIS — Z01411 Encounter for gynecological examination (general) (routine) with abnormal findings: Secondary | ICD-10-CM

## 2021-05-21 DIAGNOSIS — N200 Calculus of kidney: Secondary | ICD-10-CM | POA: Insufficient documentation

## 2021-05-21 DIAGNOSIS — N816 Rectocele: Secondary | ICD-10-CM | POA: Diagnosis not present

## 2021-05-21 DIAGNOSIS — E042 Nontoxic multinodular goiter: Secondary | ICD-10-CM | POA: Insufficient documentation

## 2021-05-21 DIAGNOSIS — M8580 Other specified disorders of bone density and structure, unspecified site: Secondary | ICD-10-CM | POA: Insufficient documentation

## 2021-05-21 DIAGNOSIS — M519 Unspecified thoracic, thoracolumbar and lumbosacral intervertebral disc disorder: Secondary | ICD-10-CM | POA: Insufficient documentation

## 2021-05-21 DIAGNOSIS — E041 Nontoxic single thyroid nodule: Secondary | ICD-10-CM | POA: Insufficient documentation

## 2021-05-21 DIAGNOSIS — Z9189 Other specified personal risk factors, not elsewhere classified: Secondary | ICD-10-CM

## 2021-05-21 DIAGNOSIS — N8111 Cystocele, midline: Secondary | ICD-10-CM | POA: Diagnosis not present

## 2021-05-21 DIAGNOSIS — E559 Vitamin D deficiency, unspecified: Secondary | ICD-10-CM | POA: Insufficient documentation

## 2021-05-21 DIAGNOSIS — Z90710 Acquired absence of both cervix and uterus: Secondary | ICD-10-CM | POA: Insufficient documentation

## 2021-05-21 DIAGNOSIS — E785 Hyperlipidemia, unspecified: Secondary | ICD-10-CM | POA: Insufficient documentation

## 2021-05-21 DIAGNOSIS — M5430 Sciatica, unspecified side: Secondary | ICD-10-CM | POA: Insufficient documentation

## 2021-05-21 DIAGNOSIS — E7800 Pure hypercholesterolemia, unspecified: Secondary | ICD-10-CM | POA: Insufficient documentation

## 2021-05-21 DIAGNOSIS — R1310 Dysphagia, unspecified: Secondary | ICD-10-CM | POA: Insufficient documentation

## 2021-05-21 DIAGNOSIS — Z638 Other specified problems related to primary support group: Secondary | ICD-10-CM | POA: Insufficient documentation

## 2021-05-21 DIAGNOSIS — M5090 Cervical disc disorder, unspecified, unspecified cervical region: Secondary | ICD-10-CM | POA: Insufficient documentation

## 2021-05-21 NOTE — Patient Instructions (Signed)
EXERCISE   We recommended that you start or continue a regular exercise program for good health. Physical activity is anything that gets your body moving, some is better than none. The CDC recommends 150 minutes per week of Moderate-Intensity Aerobic Activity and 2 or more days of Muscle Strengthening Activity.  Benefits of exercise are limitless: helps weight loss/weight maintenance, improves mood and energy, helps with depression and anxiety, improves sleep, tones and strengthens muscles, improves balance, improves bone density, protects from chronic conditions such as heart disease, high blood pressure and diabetes and so much more. To learn more visit: https://www.cdc.gov/physicalactivity/index.html  DIET: Good nutrition starts with a healthy diet of fruits, vegetables, whole grains, and lean protein sources. Drink plenty of water for hydration. Minimize empty calories, sodium, sweets. For more information about dietary recommendations visit: https://health.gov/our-work/nutrition-physical-activity/dietary-guidelines and https://www.myplate.gov/  ALCOHOL:  Women should limit their alcohol intake to no more than 7 drinks/beers/glasses of wine (combined, not each!) per week. Moderation of alcohol intake to this level decreases your risk of breast cancer and liver damage.  If you are concerned that you may have a problem, or your friends have told you they are concerned about your drinking, there are many resources to help. A well-known program that is free, effective, and available to all people all over the nation is Alcoholics Anonymous.  Check out this site to learn more: https://www.aa.org/   CALCIUM AND VITAMIN D:  Adequate intake of calcium and Vitamin D are recommended for bone health.  You should be getting between 1000-1200 mg of calcium and 800 units of Vitamin D daily between diet and supplements  PAP SMEARS:  Pap smears, to check for cervical cancer or precancers,  have traditionally been  done yearly, scientific advances have shown that most women can have pap smears less often.  However, every woman still should have a physical exam from her gynecologist every year. It will include a breast check, inspection of the vulva and vagina to check for abnormal growths or skin changes, a visual exam of the cervix, and then an exam to evaluate the size and shape of the uterus and ovaries. We will also provide age appropriate advice regarding health maintenance, like when you should have certain vaccines, screening for sexually transmitted diseases, bone density testing, colonoscopy, mammograms, etc.   MAMMOGRAMS:  All women over 40 years old should have a routine mammogram.   COLON CANCER SCREENING: Now recommend starting at age 45. At this time colonoscopy is not covered for routine screening until 50. There are take home tests that can be done between 45-49.   COLONOSCOPY:  Colonoscopy to screen for colon cancer is recommended for all women at age 50.  We know, you hate the idea of the prep.  We agree, BUT, having colon cancer and not knowing it is worse!!  Colon cancer so often starts as a polyp that can be seen and removed at colonscopy, which can quite literally save your life!  And if your first colonoscopy is normal and you have no family history of colon cancer, most women don't have to have it again for 10 years.  Once every ten years, you can do something that may end up saving your life, right?  We will be happy to help you get it scheduled when you are ready.  Be sure to check your insurance coverage so you understand how much it will cost.  It may be covered as a preventative service at no cost, but you should check   your particular policy.      Breast Self-Awareness Breast self-awareness means being familiar with how your breasts look and feel. It involves checking your breasts regularly and reporting any changes to your health care provider. Practicing breast self-awareness is  important. A change in your breasts can be a sign of a serious medical problem. Being familiar with how your breasts look and feel allows you to find any problems early, when treatment is more likely to be successful. All women should practice breast self-awareness, including women who have had breast implants. How to do a breast self-exam One way to learn what is normal for your breasts and whether your breasts are changing is to do a breast self-exam. To do a breast self-exam: Look for Changes  Remove all the clothing above your waist. Stand in front of a mirror in a room with good lighting. Put your hands on your hips. Push your hands firmly downward. Compare your breasts in the mirror. Look for differences between them (asymmetry), such as: Differences in shape. Differences in size. Puckers, dips, and bumps in one breast and not the other. Look at each breast for changes in your skin, such as: Redness. Scaly areas. Look for changes in your nipples, such as: Discharge. Bleeding. Dimpling. Redness. A change in position. Feel for Changes Carefully feel your breasts for lumps and changes. It is best to do this while lying on your back on the floor and again while sitting or standing in the shower or tub with soapy water on your skin. Feel each breast in the following way: Place the arm on the side of the breast you are examining above your head. Feel your breast with the other hand. Start in the nipple area and make  inch (2 cm) overlapping circles to feel your breast. Use the pads of your three middle fingers to do this. Apply light pressure, then medium pressure, then firm pressure. The light pressure will allow you to feel the tissue closest to the skin. The medium pressure will allow you to feel the tissue that is a little deeper. The firm pressure will allow you to feel the tissue close to the ribs. Continue the overlapping circles, moving downward over the breast until you feel your  ribs below your breast. Move one finger-width toward the center of the body. Continue to use the  inch (2 cm) overlapping circles to feel your breast as you move slowly up toward your collarbone. Continue the up and down exam using all three pressures until you reach your armpit.  Write Down What You Find  Write down what is normal for each breast and any changes that you find. Keep a written record with breast changes or normal findings for each breast. By writing this information down, you do not need to depend only on memory for size, tenderness, or location. Write down where you are in your menstrual cycle, if you are still menstruating. If you are having trouble noticing differences in your breasts, do not get discouraged. With time you will become more familiar with the variations in your breasts and more comfortable with the exam. How often should I examine my breasts? Examine your breasts every month. If you are breastfeeding, the best time to examine your breasts is after a feeding or after using a breast pump. If you menstruate, the best time to examine your breasts is 5-7 days after your period is over. During your period, your breasts are lumpier, and it may be more   difficult to notice changes. When should I see my health care provider? See your health care provider if you notice: A change in shape or size of your breasts or nipples. A change in the skin of your breast or nipples, such as a reddened or scaly area. Unusual discharge from your nipples. A lump or thick area that was not there before. Pain in your breasts. Anything that concerns you. About Rectocele  Overview  A rectocele is a type of hernia which causes different degrees of bulging of the rectal tissues into the vaginal wall.  You may even notice that it presses against the vaginal wall so much that some vaginal tissues droop outside of the opening of your vagina.  Causes of Rectocele  The most common cause is  childbirth.  The muscles and ligaments in the pelvis that hold up and support the female organs and vagina become stretched and weakened during labor and delivery.  The more babies you have, the more the support tissues are stretched and weakened.  Not everyone who has a baby will develop a rectocele.  Some women have stronger supporting tissue in the pelvis and may not have as much of a problem as others.  Women who have a Cesarean section usually do not get rectocele's unless they pushed a long time prior to the cesarean delivery.  Other conditions that can cause a rectocele include chronic constipation, a chronic cough, a lot of heavy lifting, and obesity.  Older women may have this problem because the loss of female hormones causes the vaginal tissue to become weaker.  Symptoms  There may not be any symptoms.  If you do have symptoms, they may include: Pelvic pressure in the rectal area Protrusion of the lower part of the vagina through the opening of the vagina Constipation and trapping of the stool, making it difficult to have a bowel movement.  In severe cases, you may have to press on the lower part of your vagina to help push the stool out of you rectum.  This is called splinting to empty.  Diagnosing Rectocele  Your health care provider will ask about your symptoms and perform a pelvic exam.  S/he will ask you to bear down, pushing like you are having a bowel movement so as to see how far the lower part of the vagina protrudes into the vagina and possible outside of the vagina.  Your provider will also ask you to contract the muscles of your pelvis (like you are stopping the stream in the middle of urinating) to determine the strength of your pelvic muscles.  Your provider may also do a rectal exam.  Treatment Options  If you do not have any symptoms, no treatment may be necessary.  Other treatment options include: Pelvic floor exercises: Contracting the muscles in your genital area may  help strengthen your muscles and support the organs.  Be sure to get proper exercise instruction from you physical therapist. A pessary (removealbe pelvic support device) sometimes helps rectocele symptoms. Surgery: Surgical repair may be necessary. In some cases the uterus may need to be taken out ( a hysterectomy) as well.  There are many types of surgery for pelvic support problems.  Look for physicians who specialize in repair procedures.  You can take care of yourself by: Treating and preventing constipation Avoiding heavy lifting, and lifting correctly (with your legs, not with you waist or back) Treating a chronic cough or bronchitis Not smoking avoiding too much weight gain Doing  pelvic floor exercises   2007, [employer] Doc.33About Cystocele  Overview  The pelvic organs, including the bladder, are normally supported by pelvic floor muscles and ligaments.  When these muscles and ligaments are stretched, weakened or torn, the wall between the bladder and the vagina sags or herniates causing a prolapse, sometimes called a cystocele.  This condition may cause discomfort and problems with emptying the bladder.  It can be present in various stages.  Some people are not aware of the changes.  Others may notice changes at the vaginal opening or a feeling of the bladder dropping outside the body.  Causes of a Cystocele  A cystocele is usually caused by muscle straining or stretching during childbirth.  In addition, cystocele is more common after menopause, because the hormone estrogen helps keep the elastic tissues around the pelvic organs strong.  A cystocele is more likely to occur when levels of estrogen decrease.  Other causes include: heavy lifting, chronic coughing, previous pelvic surgery and obesity.  Symptoms  A bladder that has dropped from its normal position may cause: unwanted urine leakage (stress incontinence), frequent urination or urge to urinate, incomplete  emptying of the bladder (not feeling bladder relief after emptying), pain or discomfort in the vagina, pelvis, groin, lower back or lower abdomen and frequent urinary tract infections.  Mild cases may not cause any symptoms.  Treatment Options Pelvic floor (Kegel) exercises:  Strength training the muscles in your genital area Behavioral changes: Treating and preventing constipation, taking time to empty your bladder properly, learning to lift properly and/or avoid heavy lifting when possible, stopping smoking, avoiding weight gain and treating a chronic cough or bronchitis. A pessary: A vaginal support device is sometimes used to help pelvic support caused by muscle and ligament changes. Surgery: Surgical repair may be necessary if symptoms cannot be managed with exercise, behavioral changes and a pessary.  Surgery is usually considered for severe cases.   2007, [employer]

## 2021-05-21 NOTE — Telephone Encounter (Signed)
Next Visit: 08/04/2021   Last Visit: 02/03/2021   Last Fill: 04/21/2021   Dx: Other insomnia   Current Dose per office note on 02/03/2021, Ambien 5 mg 1 tablet by mouth at bedtime for insomnia:    Okay to refill Ambien?

## 2021-06-18 ENCOUNTER — Other Ambulatory Visit: Payer: Self-pay | Admitting: Rheumatology

## 2021-06-18 NOTE — Telephone Encounter (Signed)
Next Visit: 08/04/2021   Last Visit: 02/03/2021   Last Fill: 05/21/2021   Dx: Other insomnia   Current Dose per office note on 02/03/2021, Ambien 5 mg 1 tablet by mouth at bedtime for insomnia:    Okay to refill Ambien?

## 2021-07-08 ENCOUNTER — Ambulatory Visit: Payer: Medicare Other | Admitting: Allergy and Immunology

## 2021-07-15 ENCOUNTER — Ambulatory Visit: Payer: Medicare Other | Admitting: Allergy and Immunology

## 2021-07-15 ENCOUNTER — Other Ambulatory Visit: Payer: Self-pay

## 2021-07-15 ENCOUNTER — Encounter: Payer: Self-pay | Admitting: Allergy and Immunology

## 2021-07-15 VITALS — BP 144/80 | HR 84 | Resp 12 | Ht 64.0 in | Wt 156.4 lb

## 2021-07-15 DIAGNOSIS — J4540 Moderate persistent asthma, uncomplicated: Secondary | ICD-10-CM | POA: Diagnosis not present

## 2021-07-15 DIAGNOSIS — J069 Acute upper respiratory infection, unspecified: Secondary | ICD-10-CM

## 2021-07-15 DIAGNOSIS — J3089 Other allergic rhinitis: Secondary | ICD-10-CM

## 2021-07-15 DIAGNOSIS — K219 Gastro-esophageal reflux disease without esophagitis: Secondary | ICD-10-CM | POA: Diagnosis not present

## 2021-07-15 MED ORDER — AZELASTINE-FLUTICASONE 137-50 MCG/ACT NA SUSP: NASAL | 5 refills | Status: DC

## 2021-07-15 MED ORDER — CLOTRIMAZOLE 10 MG MT TROC: OROMUCOSAL | 5 refills | Status: DC

## 2021-07-15 NOTE — Progress Notes (Signed)
? ?[facility] ? ? ?Follow-up Note ? ?Referring Provider: Wenda Low, MD ?Primary Provider: Wenda Low, MD ?Date of Office Visit: 07/15/2021 ? ?Subjective:  ? ?Christina Deleon (DOB: October 08, 1950) is a 71 y.o. female who returns to the [facility] on 07/15/2021 in re-evaluation of the following: ? ?HPI: Christina Deleon returns to this clinic in reevaluation of asthma, allergic rhinitis, and LPR.  Her last visit to this clinic was 07 January 2021. ? ?She has done very well since her last visit.  She has not required a systemic steroid to treat an exacerbation of her asthma and she rarely uses short acting bronchodilator and she can exert self without any problem while using Advair about every other day.  Likewise, she has had very little problems with her upper airway while using Dymista about every other day. ? ?Unfortunately, about 1 week ago she started developing some coughing and some throat clearing and some postnasal drip and a little bit of runny nose and sneezing.  She has not had any fever or ugly nasal discharge or other respiratory tract symptoms. ? ?Her reflux is under pretty good control at this point in time while using pantoprazole once a day and famotidine once a day. ? ?She continues to use nystatin intermittently to prevent herself from developing recurrent thrush while using her inhaled steroid.  She is interested in using a tablet or trouche instead of the solution. ? ?Allergies as of 07/15/2021   ? ?   Reactions  ? Trazodone Hcl Other (See Comments)  ? REACTION: bad headaches  ? Clindamycin/lincomycin Other (See Comments)  ? headaches  ? Penicillins Nausea Only, Rash  ? Patient has taken Amoxicillin without an issue, per patient. ?Has patient had a PCN reaction causing immediate rash, facial/tongue/throat swelling, SOB or lightheadedness with hypotension: No ?Has patient had a PCN reaction causing severe rash involving mucus membranes or skin  necrosis: No ?Has patient had a PCN reaction that required hospitalization No ?Has patient had a PCN reaction occurring within the last 10 years: No ?If all of the above answers are "NO", then may proceed with Cephalosporin use.  ? ?  ? ?  ?Medication List  ? ? ?Advair HFA 115-21 MCG/ACT inhaler ?Generic drug: fluticasone-salmeterol ?2 inhalations 1-2 times per day ?  ?albuterol 108 (90 Base) MCG/ACT inhaler ?Commonly known as: ProAir HFA ?Inhale 2 puffs into the lungs every 6 (six) hours as needed for wheezing or shortness of breath. ?  ?ALPRAZolam 0.5 MG tablet ?Commonly known as: XANAX ?as needed. ?  ?Azelastine-Fluticasone 137-50 MCG/ACT Susp ?SHAKE LIQUID AND USE 1 SPRAY IN EACH NOSTRIL TWICE DAILY ?  ?CALCIUM PO ?Take 1 tablet by mouth daily. ?  ?candesartan 16 MG tablet ?Commonly known as: ATACAND ?Take 8 mg by mouth at bedtime. ?  ?cetirizine 10 MG tablet ?Commonly known as: ZYRTEC ?Take 1 tablet (10 mg total) by mouth daily. One tab daily for allergies ?  ?diclofenac 50 MG tablet ?Commonly known as: CATAFLAM ?Take 50 mg by mouth once as needed (neck pain). ?  ?diclofenac Sodium 1 % Gel ?Commonly known as: VOLTAREN ?Apply 2 g topically 4 (four) times daily. ?  ?famotidine 40 MG tablet ?Commonly known as: Pepcid ?Take 1 tablet (40 mg total) by mouth at bedtime. ?  ?Lifitegrast 5 % Soln ?Apply 1 drop to eye 2 (two) times daily. ?  ?methocarbamol 500 MG tablet ?Commonly known as: ROBAXIN ?TAKE 1 TABLET BY MOUTH  DAILY AS NEEDED FOR MUSCLE SPASMS ?  ?montelukast 10 MG tablet ?Commonly known as: SINGULAIR ?TAKE 1 TABLET(10 MG) BY MOUTH DAILY ?  ?nystatin 100000 UNIT/ML suspension ?Commonly known as: MYCOSTATIN ?Swish and swallow with 5 ml twice daily as directed. ?  ?pantoprazole 40 MG tablet ?Commonly known as: PROTONIX ?TAKE 1 TABLET(40 MG) BY MOUTH TWICE DAILY ?  ?rosuvastatin 20 MG tablet ?Commonly known as: CRESTOR ?TAKE 1 TABLET(20 MG) BY MOUTH DAILY ?  ?VITAMIN A PO ?Take by mouth daily. ?  ?VITAMIN C  PO ?Take 1 tablet by mouth daily. ?  ?Vitamin D 50 MCG (2000 UT) tablet ?Take 2,000 Units by mouth daily. ?  ?zolmitriptan 5 MG disintegrating tablet ?Commonly known as: ZOMIG-ZMT ?Take 1 tablet (5 mg total) by mouth as needed for migraine. ?  ?zolpidem 5 MG tablet ?Commonly known as: AMBIEN ?TAKE 1 TABLET(5 MG) BY MOUTH AT BEDTIME AS NEEDED FOR SLEEP ?  ? ?Past Medical History:  ?Diagnosis Date  ? Adenomatous colon polyp 10/1997, and 03/2009  ? Allergic rhinitis   ? Allergy   ? Aortic atherosclerosis ([place])   ? Arthritis   ? osteo  ? Asthma   ? Chronic gastritis   ? Coronary artery calcification seen on CT scan   ? Fibromyalgia   ? GERD (gastroesophageal reflux disease)   ? Hemorrhoids   ? Hyperlipidemia   ? Hypertension   ? Menorrhagia   ? partial hysterectomy  ? Migraines   ? Osteoarthritis   ? PONV (postoperative nausea and vomiting)   ? Premature atrial contractions   ? Sleep disturbance   ? STD (sexually transmitted disease) 01/22/11 culture proven  ? HSV type I labia  ? ? ?Past Surgical History:  ?Procedure Laterality Date  ? CATARACT EXTRACTION Bilateral 10/16 & 11/16  ? EXCISION METACARPAL MASS Right 11/16/2016  ? Procedure: RIGHT LITTLE FINGER CYST EXCISION;  Surgeon: Milly Jakob, MD;  Location: [place];  Service: Orthopedics;  Laterality: Right;  ? TONSILLECTOMY  age 35  ? tooth extract    ? with bone graft  ? Turbinate sinus surgery  2003  ? VAGINAL HYSTERECTOMY  1998  ? secondary to prolapse, ovaries remain  ? ? ?Review of systems negative except as noted in HPI / PMHx or noted below: ? ?Review of Systems  ?Constitutional: Negative.   ?HENT: Negative.    ?Eyes: Negative.   ?Respiratory: Negative.    ?Cardiovascular: Negative.   ?Gastrointestinal: Negative.   ?Genitourinary: Negative.   ?Musculoskeletal: Negative.   ?Skin: Negative.   ?Neurological: Negative.   ?Endo/Heme/Allergies: Negative.   ?Psychiatric/Behavioral: Negative.    ? ? ?Objective:  ? ?Vitals:  ? 07/15/21 1600  ?BP: (!)  144/80  ?Pulse: 84  ?Resp: 12  ?SpO2: 98%  ? ?Height: '5\' 4"'$  (162.6 cm)  ?Weight: 156 lb 6.4 oz (70.9 kg)  ? ?Physical Exam ?Constitutional:   ?   Appearance: She is not diaphoretic.  ?HENT:  ?   Head: Normocephalic.  ?   Right Ear: Tympanic membrane, ear canal and external ear normal.  ?   Left Ear: Tympanic membrane, ear canal and external ear normal.  ?   Nose: Nose normal. No mucosal edema or rhinorrhea.  ?   Mouth/Throat:  ?   Pharynx: Uvula midline. No oropharyngeal exudate.  ?Eyes:  ?   Conjunctiva/sclera: Conjunctivae normal.  ?Neck:  ?   Thyroid: No thyromegaly.  ?   Trachea: Trachea normal. No tracheal tenderness or tracheal deviation.  ?Cardiovascular:  ?  Rate and Rhythm: Normal rate and regular rhythm.  ?   Heart sounds: Normal heart sounds, S1 normal and S2 normal. No murmur heard. ?Pulmonary:  ?   Effort: No respiratory distress.  ?   Breath sounds: Normal breath sounds. No stridor. No wheezing or rales.  ?Lymphadenopathy:  ?   Head:  ?   Right side of head: No tonsillar adenopathy.  ?   Left side of head: No tonsillar adenopathy.  ?   Cervical: No cervical adenopathy.  ?Skin: ?   Findings: No erythema or rash.  ?   Nails: There is no clubbing.  ?Neurological:  ?   Mental Status: She is alert.  ? ? ?Diagnostics:  ?  ?Spirometry was performed and demonstrated an FEV1 of 1.66 at 89 % of predicted. ? ?Assessment and Plan:  ? ?1. Not well controlled moderate persistent asthma   ?2. Other allergic rhinitis   ?3. LPRD (laryngopharyngeal reflux disease)   ?4. Viral upper respiratory tract infection   ? ? ?1. Continue to treat reflux/LPR:  ? ? A. Pantoprazole '40mg'$  1-2 times a day ? B. Famotidine 40 mg in evening  ? C. Replace throat clearing with swallowing/drinking maneuver ? ?2. Continue to treat and prevent inflammation: ? ? A. Dymista -1 spray each nostril 1-2 times per day ? B. montelukast '10mg'$  -1 time per day ? C. Advair 115 HFA - 2 inhalations 1-2 times per day ? ?3. If needed: ? ? A. ProAir HFA ? B.  Zyrtec ? C. Clotrimazole troches - 1 time per day ? D. nasal saline   ? ?4. For this episode: ? ? A. Prednisone 10 mg - 1 tablet 1 time per day for 7 days only ? ?5.  Return to clinic in 6 months or earlier

## 2021-07-15 NOTE — Patient Instructions (Addendum)
?  1. Continue to treat reflux/LPR:  ? ? A. Pantoprazole '40mg'$  1-2 times a day ? B. Famotidine 40 mg in evening  ? C. Replace throat clearing with swallowing/drinking maneuver ? ?2. Continue to treat and prevent inflammation: ? ? A. Dymista -1 spray each nostril 1-2 times per day ? B. montelukast '10mg'$  -1 time per day ? C. Advair 115 HFA - 2 inhalations 1-2 times per day ? ?3. If needed: ? ? A. ProAir HFA ? B. Zyrtec ? C. Clotrimazole troches - 1 time per day ? D. nasal saline   ? ?4. For this episode: ? ? A. Prednisone 10 mg - 1 tablet 1 time per day for 7 days only ? ?5.  Return to clinic in 6 months or earlier if problem ?  ?  ? ?  ? ?    ?

## 2021-07-16 ENCOUNTER — Encounter: Payer: Self-pay | Admitting: Allergy and Immunology

## 2021-07-21 ENCOUNTER — Other Ambulatory Visit: Payer: Self-pay | Admitting: Physician Assistant

## 2021-07-21 NOTE — Telephone Encounter (Signed)
Next Visit: 08/04/2021 ? ?Last Visit: 02/03/2021 ? ?Last Fill: 06/18/2021 ? ?DX: Other insomnia  ? ?Current Dose per office note on 02/03/2021: Ambien 5 mg 1 tablet by mouth at bedtime for insomnia.  ? ?Okay to refill Lorrin Mais?  ?

## 2021-07-21 NOTE — Progress Notes (Signed)
? ?Office Visit Note ? ?Patient: Christina Deleon             ?Date of Birth: [birth year]           ?MRN: [id number]             ?PCP: Wenda Low, MD ?Referring: Wenda Low, MD ?Visit Date: 08/04/2021 ?Occupation: '@GUAROCC'$ @ ? ?Subjective:  ?Medication management ? ?History of Present Illness: Christina Deleon is a 71 y.o. female with history of fibromyalgia, osteoarthritis and degenerative disc disease.  She continues to have discomfort in her neck and lower back.  She also complains of discomfort in her hands, knee joints and her right plantar fascia off-and-on.  She continues to have generalized pain from fibromyalgia syndrome.  She takes Robaxin on as needed basis.  She has been taking Ambien 5 mg at bedtime for insomnia. ? ?Activities of Daily Living:  ?Patient reports morning stiffness for all day. ?Patient Reports nocturnal pain.  ?Difficulty dressing/grooming: Denies ?Difficulty climbing stairs: Reports ?Difficulty getting out of chair: Reports ?Difficulty using hands for taps, buttons, cutlery, and/or writing: Reports ? ?Review of Systems  ?Constitutional:  Positive for fatigue.  ?HENT:  Positive for mouth dryness and nose dryness. Negative for mouth sores.   ?Eyes:  Positive for itching and dryness. Negative for pain.  ?Respiratory:  Positive for shortness of breath and difficulty breathing.   ?     History of asthma and allergies  ?Cardiovascular:  Positive for palpitations. Negative for chest pain.  ?     Followed by cardiology  ?Gastrointestinal:  Positive for constipation. Negative for blood in stool and diarrhea.  ?Endocrine: Negative for increased urination.  ?Genitourinary:  Negative for difficulty urinating.  ?Musculoskeletal:  Positive for joint pain, joint pain, myalgias, morning stiffness, muscle tenderness and myalgias. Negative for joint swelling.  ?Skin:  Negative for color change, rash, redness and sensitivity to sunlight.  ?Allergic/Immunologic: Positive for susceptible to infections.   ?Neurological:  Positive for dizziness, headaches and weakness. Negative for numbness and memory loss.  ?Hematological:  Negative for bruising/bleeding tendency.  ?Psychiatric/Behavioral:  Positive for depressed mood and sleep disturbance. Negative for confusion. The patient is nervous/anxious.   ? ?PMFS History:  ?Patient Active Problem List  ? Diagnosis Date Noted  ? Anxiety 05/21/2021  ? Dyslipidemia 05/21/2021  ? Dysphagia 05/21/2021  ? External hemorrhoids without complication 47/42/5956  ? Generalized anxiety disorder 05/21/2021  ? Hardening of the aorta (main artery of the heart) ([place]) 05/21/2021  ? History of hysterectomy 05/21/2021  ? Thyroid nodule 05/21/2021  ? Kidney stone 05/21/2021  ? Multinodular goiter 05/21/2021  ? Osteopenia 05/21/2021  ? Pure hypercholesterolemia 05/21/2021  ? Sciatica 05/21/2021  ? Stress due to family tension 05/21/2021  ? Vitamin D deficiency 05/21/2021  ? Cervical disc disease 05/21/2021  ? Unspecified thoracic, thoracolumbar and lumbosacral intervertebral disc disorder 05/21/2021  ? Dysosmia 12/18/2019  ? Cough variant asthma vs uacs  04/13/2018  ? Fibromyalgia 04/19/2016  ? Other fatigue 04/19/2016  ? 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  ? Primary osteoarthritis of both knees 04/19/2016  ? Leg pain 01/22/2016  ? Restless legs syndrome 01/22/2016  ? Snoring 11/19/2015  ? Periodic limb movement sleep disorder 11/19/2015  ? GERD (gastroesophageal reflux disease) 10/08/2015  ? Mild persistent asthma 04/09/2015  ? LPRD (laryngopharyngeal reflux disease) 04/09/2015  ? Allergic rhinoconjunctivitis 04/09/2015  ? HEMATOCHEZIA 04/01/2010  ? COUGH 04/01/2010  ? GASTRITIS 09/30/2009  ? NAUSEA 09/30/2009  ? ABDOMINAL PAIN-EPIGASTRIC 09/30/2009  ? DYSTHYMIC DISORDER  09/26/2009  ? PALPITATIONS 06/17/2009  ? Impingement syndrome of shoulder, left 06/04/2009  ? CONSTIPATION 02/05/2009  ? PERSONAL HX COLONIC POLYPS 02/05/2009  ? ASTHMA, PERSISTENT, MILD 02/04/2009  ? CHEST PAIN, ATYPICAL 02/04/2009   ? COLONIC POLYPS, BENIGN 12/18/2008  ? Hyperlipidemia LDL goal <70 12/18/2008  ? Insomnia 12/18/2008  ? MIGRAINE, COMMON 12/18/2008  ? Essential hypertension 12/18/2008  ? ALLERGIC RHINITIS 12/18/2008  ? Primary osteoarthritis of both hands 12/18/2008  ? DJD (degenerative joint disease), cervical 12/18/2008  ? NECK PAIN, CHRONIC 12/18/2008  ? LOW BACK PAIN, CHRONIC 12/18/2008  ?  ?Past Medical History:  ?Diagnosis Date  ? Adenomatous colon polyp 10/1997, and 03/2009  ? Allergic rhinitis   ? Allergy   ? Aortic atherosclerosis ([place])   ? Arthritis   ? osteo  ? Asthma   ? Chronic gastritis   ? Coronary artery calcification seen on CT scan   ? Fibromyalgia   ? GERD (gastroesophageal reflux disease)   ? Hemorrhoids   ? Hyperlipidemia   ? Hypertension   ? Menorrhagia   ? partial hysterectomy  ? Migraines   ? Osteoarthritis   ? PONV (postoperative nausea and vomiting)   ? Premature atrial contractions   ? Sleep disturbance   ? STD (sexually transmitted disease) 01/22/11 culture proven  ? HSV type I labia  ?  ?Family History  ?Problem Relation Age of Onset  ? Coronary artery disease Mother   ? Hypertension Mother   ? Hyperlipidemia Mother   ? Pulmonary fibrosis Mother   ? Lung disease Mother   ? Throat cancer Father   ? Coronary artery disease Father   ? Heart attack Father 44  ? Stroke Father   ?     during procedure  ? Prostate cancer Brother   ? Heart disease Brother   ? Diabetes Brother   ? Diabetes Paternal Aunt   ?     x 2  ? Prostate cancer Paternal Uncle   ? Stroke Maternal Grandmother   ?     or MI  ? Diabetes Paternal Grandmother   ? Coronary artery disease Paternal Grandmother   ? Migraines Daughter   ? Migraines Daughter   ? Colon cancer Neg Hx   ? ?Past Surgical History:  ?Procedure Laterality Date  ? CATARACT EXTRACTION Bilateral 10/16 & 11/16  ? EXCISION METACARPAL MASS Right 11/16/2016  ? Procedure: RIGHT LITTLE FINGER CYST EXCISION;  Surgeon: Milly Jakob, MD;  Location: [facility];  Service:  Orthopedics;  Laterality: Right;  ? TONSILLECTOMY  age 67  ? tooth extract    ? with bone graft  ? Turbinate sinus surgery  2003  ? VAGINAL HYSTERECTOMY  1998  ? secondary to prolapse, ovaries remain  ? ?Social History  ? ?Social History Narrative  ? She lives with husband.  They have 2 grown children.  ? She is retired Product/process development scientist.  ? Highest level of education:  2 years of college  ? Right handed  ? Regular exercise, diet of fruits, veggies, limited fried foods, limited water  ?   ? [facility]:  ? Originally from [place]. Has also lived in [place]. Previously worked doing Software engineer. No pets currently. No bird, mold, or hot tub exposure. Does have a musty smell in their home but it is new. Enjoys reading. 1 indoor plant. Has carpet in her home including in the bedroom. Also has draperies.   ? Right handed  ? ?Immunization History  ?  Administered Date(s) Administered  ? Influenza Split 04/02/2014  ? Influenza, Seasonal, Injecte, Preservative Fre 04/09/2015  ? Influenza,inj,Quad PF,6+ Mos 01/14/2016, 03/04/2017, 01/24/2018  ? Influenza-Unspecified 02/08/2019, 02/25/2020, 02/17/2021  ? PFIZER(Purple Top)SARS-COV-2 Vaccination 06/23/2019, 07/18/2019, 04/04/2020  ? Pneumococcal Conjugate-13 11/01/2015, 11/25/2015  ? Pneumococcal Polysaccharide-23 11/20/2013  ? Tdap 11/20/2009  ?  ? ?Objective: ?Vital Signs: BP 124/79 (BP Location: Left Arm, Patient Position: Sitting, Cuff Size: Normal)   Pulse 92   Ht '5\' 4"'$  (1.626 m)   Wt 156 lb 12.8 oz (71.1 kg)   LMP 07/02/1994   BMI 26.91 kg/m?   ? ?Physical Exam ?Vitals and nursing note reviewed.  ?Constitutional:   ?   Appearance: She is well-developed.  ?HENT:  ?   Head: Normocephalic and atraumatic.  ?Eyes:  ?   Conjunctiva/sclera: Conjunctivae normal.  ?Cardiovascular:  ?   Rate and Rhythm: Normal rate and regular rhythm.  ?   Heart sounds: Normal heart sounds.  ?Pulmonary:  ?   Effort: Pulmonary effort is normal.  ?   Breath sounds:  Normal breath sounds.  ?Abdominal:  ?   General: Bowel sounds are normal.  ?   Palpations: Abdomen is soft.  ?Musculoskeletal:  ?   Cervical back: Normal range of motion.  ?Lymphadenopathy:  ?   Cervical: No cerv

## 2021-07-28 ENCOUNTER — Other Ambulatory Visit: Payer: Self-pay | Admitting: Allergy and Immunology

## 2021-07-29 ENCOUNTER — Telehealth: Payer: Self-pay | Admitting: Allergy and Immunology

## 2021-07-29 NOTE — Telephone Encounter (Signed)
Patient states Advair is costing her $200. She went on [employer] and found 3 generic alternatives that are cheaper. She mentioned they are not exactly the same dosage. She wanted to speak with a nurse to explain the medications she found and to see if it was possible we could send in one of the 3.  ? ?Best pharmacy- ?[facility] [address]  ?

## 2021-07-29 NOTE — Telephone Encounter (Signed)
Talked with patient.  She mentioned that on [email or web address] she found that generic Advair 100/50, 250/50, and 500/50 are cheaper than the Advair.  She did mention that she cannot do powder inhalers due to thrush. I told her that I think all those strengths of generic Advair are powder inhalers.  Any suggestions?   ?

## 2021-07-30 MED ORDER — FLUTICASONE-SALMETEROL 113-14 MCG/ACT IN AEPB: INHALATION_SPRAY | RESPIRATORY_TRACT | 5 refills | Status: DC

## 2021-07-30 NOTE — Telephone Encounter (Signed)
Talked with Dr. Neldon Mc and did some research on [email or web address].  Generic Advair- Fluticasone Salmeterol 989/21 respiclick should be $19.41 at [facility] according to [email or web address]. I informed patient and she asked me to send RX to [facility] on [address] in [place].  [facility] sent.  I asked her to let us know if she has any issues.  ?

## 2021-07-30 NOTE — Telephone Encounter (Signed)
Please inform Sakara that the only nonpowered her alternatives or Symbicort, Advair HFA, AirDuo. Will her insurance cover any of these choices? ?

## 2021-07-31 ENCOUNTER — Telehealth: Payer: Self-pay

## 2021-07-31 NOTE — Telephone Encounter (Signed)
Pharmacy called to verify the instructions for the use of Advair. Pharmacist stated that if the patient uses as directed per Dr. Neldon Mc 1-2 times a day that the prescription would run out faster than usual. Let pharmacy know that per the AVS 1 inhalation 1-2 times per day is what was prescribed. ?

## 2021-08-04 ENCOUNTER — Encounter: Payer: Self-pay | Admitting: Rheumatology

## 2021-08-04 ENCOUNTER — Ambulatory Visit: Payer: Medicare Other | Admitting: Rheumatology

## 2021-08-04 VITALS — BP 124/79 | HR 92 | Ht 64.0 in | Wt 156.8 lb

## 2021-08-04 DIAGNOSIS — Z8639 Personal history of other endocrine, nutritional and metabolic disease: Secondary | ICD-10-CM

## 2021-08-04 DIAGNOSIS — M47816 Spondylosis without myelopathy or radiculopathy, lumbar region: Secondary | ICD-10-CM

## 2021-08-04 DIAGNOSIS — Z8709 Personal history of other diseases of the respiratory system: Secondary | ICD-10-CM

## 2021-08-04 DIAGNOSIS — Z8719 Personal history of other diseases of the digestive system: Secondary | ICD-10-CM

## 2021-08-04 DIAGNOSIS — M170 Bilateral primary osteoarthritis of knee: Secondary | ICD-10-CM

## 2021-08-04 DIAGNOSIS — M7062 Trochanteric bursitis, left hip: Secondary | ICD-10-CM

## 2021-08-04 DIAGNOSIS — R5383 Other fatigue: Secondary | ICD-10-CM | POA: Diagnosis not present

## 2021-08-04 DIAGNOSIS — M19042 Primary osteoarthritis, left hand: Secondary | ICD-10-CM

## 2021-08-04 DIAGNOSIS — M722 Plantar fascial fibromatosis: Secondary | ICD-10-CM

## 2021-08-04 DIAGNOSIS — Z8679 Personal history of other diseases of the circulatory system: Secondary | ICD-10-CM

## 2021-08-04 DIAGNOSIS — G4709 Other insomnia: Secondary | ICD-10-CM

## 2021-08-04 DIAGNOSIS — M797 Fibromyalgia: Secondary | ICD-10-CM | POA: Diagnosis not present

## 2021-08-04 DIAGNOSIS — M25511 Pain in right shoulder: Secondary | ICD-10-CM

## 2021-08-04 DIAGNOSIS — M25512 Pain in left shoulder: Secondary | ICD-10-CM

## 2021-08-04 DIAGNOSIS — Z8669 Personal history of other diseases of the nervous system and sense organs: Secondary | ICD-10-CM

## 2021-08-04 DIAGNOSIS — M19041 Primary osteoarthritis, right hand: Secondary | ICD-10-CM

## 2021-08-04 DIAGNOSIS — Z86010 Personal history of colonic polyps: Secondary | ICD-10-CM

## 2021-08-04 DIAGNOSIS — M5030 Other cervical disc degeneration, unspecified cervical region: Secondary | ICD-10-CM

## 2021-08-04 DIAGNOSIS — M542 Cervicalgia: Secondary | ICD-10-CM

## 2021-08-04 DIAGNOSIS — G8929 Other chronic pain: Secondary | ICD-10-CM

## 2021-08-04 DIAGNOSIS — M7061 Trochanteric bursitis, right hip: Secondary | ICD-10-CM

## 2021-08-04 MED ORDER — LIDOCAINE HCL 1 % IJ SOLN
0.5000 mL | INTRAMUSCULAR | Status: AC | PRN
  Administered 2021-08-04: .5 mL

## 2021-08-04 MED ORDER — METHOCARBAMOL 500 MG PO TABS: ORAL_TABLET | ORAL | 2 refills | Status: DC

## 2021-08-04 MED ORDER — TRIAMCINOLONE ACETONIDE 40 MG/ML IJ SUSP
10.0000 mg | INTRAMUSCULAR | Status: AC | PRN
  Administered 2021-08-04: 10 mg via INTRAMUSCULAR

## 2021-08-04 NOTE — Patient Instructions (Signed)
Back Exercises ?The following exercises strengthen the muscles that help to support the trunk (torso) and back. They also help to keep the lower back flexible. Doing these exercises can help to prevent or lessen existing low back pain. ?If you have back pain or discomfort, try doing these exercises 2-3 times each day or as told by your health care provider. ?As your pain improves, do them once each day, but increase the number of times that you repeat the steps for each exercise (do more repetitions). ?To prevent the recurrence of back pain, continue to do these exercises once each day or as told by your health care provider. ?Do exercises exactly as told by your health care provider and adjust them as directed. It is normal to feel mild stretching, pulling, tightness, or discomfort as you do these exercises, but you should stop right away if you feel sudden pain or your pain gets worse. ?Exercises ?Single knee to chest ?Repeat these steps 3-5 times for each leg: ?Lie on your back on a firm bed or the floor with your legs extended. ?Bring one knee to your chest. Your other leg should stay extended and in contact with the floor. ?Hold your knee in place by grabbing your knee or thigh with both hands and hold. ?Pull on your knee until you feel a gentle stretch in your lower back or buttocks. ?Hold the stretch for 10-30 seconds. ?Slowly release and straighten your leg. ? ?Pelvic tilt ?Repeat these steps 5-10 times: ?Lie on your back on a firm bed or the floor with your legs extended. ?Bend your knees so they are pointing toward the ceiling and your feet are flat on the floor. ?Tighten your lower abdominal muscles to press your lower back against the floor. This motion will tilt your pelvis so your tailbone points up toward the ceiling instead of pointing to your feet or the floor. ?With gentle tension and even breathing, hold this position for 5-10 seconds. ? ?Cat-cow ?Repeat these steps until your lower back becomes  more flexible: ?Get into a hands-and-knees position on a firm bed or the floor. Keep your hands under your shoulders, and keep your knees under your hips. You may place padding under your knees for comfort. ?Let your head hang down toward your chest. Contract your abdominal muscles and point your tailbone toward the floor so your lower back becomes rounded like the back of a cat. ?Hold this position for 5 seconds. ?Slowly lift your head, let your abdominal muscles relax, and point your tailbone up toward the ceiling so your back forms a sagging arch like the back of a cow. ?Hold this position for 5 seconds. ? ?Press-ups ?Repeat these steps 5-10 times: ?Lie on your abdomen (face-down) on a firm bed or the floor. ?Place your palms near your head, about shoulder-width apart. ?Keeping your back as relaxed as possible and keeping your hips on the floor, slowly straighten your arms to raise the top half of your body and lift your shoulders. Do not use your back muscles to raise your upper torso. You may adjust the placement of your hands to make yourself more comfortable. ?Hold this position for 5 seconds while you keep your back relaxed. ?Slowly return to lying flat on the floor. ? ?Bridges ?Repeat these steps 10 times: ?Lie on your back on a firm bed or the floor. ?Bend your knees so they are pointing toward the ceiling and your feet are flat on the floor. Your arms should be flat  at your sides, next to your body. ?Tighten your buttocks muscles and lift your buttocks off the floor until your waist is at almost the same height as your knees. You should feel the muscles working in your buttocks and the back of your thighs. If you do not feel these muscles, slide your feet 1-2 inches (2.5-5 cm) farther away from your buttocks. ?Hold this position for 3-5 seconds. ?Slowly lower your hips to the starting position, and allow your buttocks muscles to relax completely. ?If this exercise is too easy, try doing it with your arms  crossed over your chest. ?Abdominal crunches ?Repeat these steps 5-10 times: ?Lie on your back on a firm bed or the floor with your legs extended. ?Bend your knees so they are pointing toward the ceiling and your feet are flat on the floor. ?Cross your arms over your chest. ?Tip your chin slightly toward your chest without bending your neck. ?Tighten your abdominal muscles and slowly raise your torso high enough to lift your shoulder blades a tiny bit off the floor. Avoid raising your torso higher than that because it can put too much stress on your lower back and does not help to strengthen your abdominal muscles. ?Slowly return to your starting position. ? ?Back lifts ?Repeat these steps 5-10 times: ?Lie on your abdomen (face-down) with your arms at your sides, and rest your forehead on the floor. ?Tighten the muscles in your legs and your buttocks. ?Slowly lift your chest off the floor while you keep your hips pressed to the floor. Keep the back of your head in line with the curve in your back. Your eyes should be looking at the floor. ?Hold this position for 3-5 seconds. ?Slowly return to your starting position. ? ?Contact a health care provider if: ?Your back pain or discomfort gets much worse when you do an exercise. ?Your worsening back pain or discomfort does not lessen within 2 hours after you exercise. ?If you have any of these problems, stop doing these exercises right away. Do not do them again unless your health care provider says that you can. ?Get help right away if: ?You develop sudden, severe back pain. If this happens, stop doing the exercises right away. Do not do them again unless your health care provider says that you can. ?This information is not intended to replace advice given to you by your health care provider. Make sure you discuss any questions you have with your health care provider. ?Document Revised: 10/14/2020 Document Reviewed: 07/02/2020 ?Elsevier Patient Education ? [place]. ?Cervical Strain and Sprain Rehab ?Ask your health care provider which exercises are safe for you. Do exercises exactly as told by your health care provider and adjust them as directed. It is normal to feel mild stretching, pulling, tightness, or discomfort as you do these exercises. Stop right away if you feel sudden pain or your pain gets worse. Do not begin these exercises until told by your health care provider. ?Stretching and range-of-motion exercises ?Cervical side bending ? ?Using good posture, sit on a stable chair or stand up. ?Without moving your shoulders, slowly tilt your left / right ear to your shoulder until you feel a stretch in the opposite side neck muscles. You should be looking straight ahead. ?Hold for __________ seconds. ?Repeat with the other side of your neck. ?Repeat __________ times. Complete this exercise __________ times a day. ?Cervical rotation ? ?Using good posture, sit on a stable chair or stand up. ?Slowly turn your  head to the side as if you are looking over your left / right shoulder. ?Keep your eyes level with the ground. ?Stop when you feel a stretch along the side and the back of your neck. ?Hold for __________ seconds. ?Repeat this by turning to your other side. ?Repeat __________ times. Complete this exercise __________ times a day. ?Thoracic extension and pectoral stretch ?Roll a towel or a small blanket so it is about 4 inches (10 cm) in diameter. ?Lie down on your back on a firm surface. ?Put the towel lengthwise, under your spine in the middle of your back. It should not be under your shoulder blades. The towel should line up with your spine from your middle back to your lower back. ?Put your hands behind your head and let your elbows fall out to your sides. ?Hold for __________ seconds. ?Repeat __________ times. Complete this exercise __________ times a day. ?Strengthening exercises ?Isometric upper cervical flexion ?Lie on your back with a thin pillow behind your  head and a small rolled-up towel under your neck. ?Gently tuck your chin toward your chest and nod your head down to look toward your feet. Do not lift your head off the pillow. ?Hold for __________ sec

## 2021-08-08 ENCOUNTER — Other Ambulatory Visit: Payer: Self-pay | Admitting: Allergy and Immunology

## 2021-08-19 ENCOUNTER — Other Ambulatory Visit: Payer: Self-pay | Admitting: Physician Assistant

## 2021-08-19 NOTE — Telephone Encounter (Signed)
Next Visit: 02/03/2022 ? ?Last Visit: 08/04/2021 ? ?Last Fill: 07/21/2021 ? ?Dx: Other insomnia  ? ?Current Dose per office note on 08/04/2021: Ambien 5 mg 1 tablet by mouth at bedtime for insomnia.   ? ?Okay to refill Ambien?   ?

## 2021-09-16 ENCOUNTER — Other Ambulatory Visit: Payer: Self-pay | Admitting: Rheumatology

## 2021-09-17 NOTE — Telephone Encounter (Signed)
Next Visit: 02/03/2022   Last Visit: 08/04/2021   Last Fill: 08/19/2021   Dx: Other insomnia    Current Dose per office note on 08/04/2021: Ambien 5 mg 1 tablet by mouth at bedtime for insomnia.     Okay to refill Ambien?

## 2021-10-15 ENCOUNTER — Other Ambulatory Visit: Payer: Self-pay | Admitting: Rheumatology

## 2021-10-15 NOTE — Telephone Encounter (Signed)
Next Visit: 02/03/2022   Last Visit: 08/04/2021   Last Fill: 09/17/2021   Dx: Other insomnia    Current Dose per office note on 08/04/2021: Ambien 5 mg 1 tablet by mouth at bedtime for insomnia.     Okay to refill Ambien?

## 2021-11-16 ENCOUNTER — Other Ambulatory Visit: Payer: Self-pay | Admitting: Rheumatology

## 2021-11-17 NOTE — Telephone Encounter (Signed)
Next Visit: 02/03/2022   Last Visit: 08/04/2021   Last Fill: 10/15/2021   Dx: Other insomnia    Current Dose per office note on 08/04/2021: Ambien 5 mg 1 tablet by mouth at bedtime for insomnia.     Okay to refill Ambien?

## 2021-11-18 ENCOUNTER — Encounter: Payer: Self-pay | Admitting: Specialist

## 2021-11-18 ENCOUNTER — Ambulatory Visit: Payer: Medicare Other | Admitting: Specialist

## 2021-11-18 ENCOUNTER — Ambulatory Visit (INDEPENDENT_AMBULATORY_CARE_PROVIDER_SITE_OTHER): Payer: Medicare Other

## 2021-11-18 VITALS — BP 139/79 | HR 93 | Ht 60.0 in | Wt 158.0 lb

## 2021-11-18 DIAGNOSIS — M47812 Spondylosis without myelopathy or radiculopathy, cervical region: Secondary | ICD-10-CM

## 2021-11-18 MED ORDER — DICLOFENAC POTASSIUM 50 MG PO TABS: 50.0000 mg | ORAL_TABLET | Freq: Two times a day (BID) | ORAL | 4 refills | Status: AC

## 2021-11-18 NOTE — Patient Instructions (Signed)
Avoid overhead lifting and overhead use of the arms. Do not lift greater than 5 lbs. Adjust head rest in vehicle to prevent hyperextension if rear ended. Take extra precautions to avoid falling.   Injection with steroid may be of benefit. Hemp CBD capsules, amazon.com 5,000-7,000 mg per bottle, 60 capsules per bottle, take one capsule twice a day.  Follow-Up Instructions: No follow-ups on file.

## 2021-11-18 NOTE — Progress Notes (Signed)
Office Visit Note   Patient: Christina Deleon           Date of Birth: 1951-02-28           MRN: [id number] Visit Date: 11/18/2021              Requested by: Wenda Low, MD [address] [place],  [place] [address] PCP: Wenda Low, MD   Assessment & Plan: Visit Diagnoses:  1. Spondylosis without myelopathy or radiculopathy, cervical region     Plan: Avoid overhead lifting and overhead use of the arms. Do not lift greater than 5-10 lbs. Adjust head rest in vehicle to prevent hyperextension if rear ended. Take extra precautions to avoid falling.   Injection with steroid may be of benefit. Hemp CBD capsules, [email or web address] 5,000-7,000 mg per bottle, 60 capsules per bottle, take one capsule twice a day.  Follow-Up Instructions: No follow-ups on file.    Follow-Up Instructions: Return in about 4 weeks (around 12/16/2021).   Orders:  Orders Placed This Encounter  Procedures   XR Cervical Spine 2 or 3 views   No orders of the defined types were placed in this encounter.     Procedures: No procedures performed   Clinical Data: No additional findings.   Subjective: Chief Complaint  Patient presents with   Neck - Follow-up    71 year old right handed female with several year history of neck pain and it is gradually worsening. No bowel or bladder difficulty. Pain is severe intermittantly with associated stiffness and difficulty with ROM. Taking intermittant     Review of Systems  Constitutional: Negative.   HENT: Negative.    Eyes: Negative.   Respiratory: Negative.    Cardiovascular: Negative.   Gastrointestinal: Negative.   Endocrine: Negative.   Genitourinary: Negative.   Musculoskeletal: Negative.   Skin: Negative.   Allergic/Immunologic: Negative.   Neurological: Negative.   Hematological: Negative.   Psychiatric/Behavioral: Negative.       Objective: Vital Signs: BP 139/79 (BP Location: Left Arm, Patient Position: Sitting, Cuff Size:  Normal)   Pulse 93   Ht 5' (1.524 m)   Wt 158 lb (71.7 kg)   LMP 07/02/1994   BMI 30.86 kg/m   Physical Exam Constitutional:      Appearance: She is well-developed.  HENT:     Head: Normocephalic and atraumatic.  Eyes:     Pupils: Pupils are equal, round, and reactive to light.  Pulmonary:     Effort: Pulmonary effort is normal.     Breath sounds: Normal breath sounds.  Abdominal:     General: Bowel sounds are normal.     Palpations: Abdomen is soft.  Musculoskeletal:     Cervical back: Normal range of motion and neck supple.  Skin:    General: Skin is warm and dry.  Neurological:     Mental Status: She is alert and oriented to person, place, and time.  Psychiatric:        Behavior: Behavior normal.        Thought Content: Thought content normal.        Judgment: Judgment normal.     Back Exam   Tenderness  The patient is experiencing tenderness in the cervical.  Range of Motion  Extension:  abnormal  Flexion:  abnormal  Lateral bend right:  abnormal  Lateral bend left:  abnormal  Rotation right:  abnormal  Rotation left:  abnormal   Muscle Strength  Right Quadriceps:  5/5  Left Quadriceps:  5/5  Right Hamstrings:  5/5  Left Hamstrings:  5/5   Reflexes  Patellar:  2/4 Achilles:  2/4 Biceps:  2/4      Specialty Comments:  No specialty comments available.  Imaging: XR Cervical Spine 2 or 3 views  Result Date: 11/18/2021 AP and lateral flexion and extension radiographs show there is 5 level disc narrowing C3-4 through C6-7. There is spondylosis of the uncovertebral joint C4-5 and C5-6.    PMFS History: Patient Active Problem List   Diagnosis Date Noted   Anxiety 05/21/2021   Dyslipidemia 05/21/2021   Dysphagia 05/21/2021   External hemorrhoids without complication 57/84/6962   Generalized anxiety disorder 05/21/2021   Hardening of the aorta (main artery of the heart) ([place]) 05/21/2021   History of hysterectomy 05/21/2021   Thyroid nodule  05/21/2021   Kidney stone 05/21/2021   Multinodular goiter 05/21/2021   Osteopenia 05/21/2021   Pure hypercholesterolemia 05/21/2021   Sciatica 05/21/2021   Stress due to family tension 05/21/2021   Vitamin D deficiency 05/21/2021   Cervical disc disease 05/21/2021   Unspecified thoracic, thoracolumbar and lumbosacral intervertebral disc disorder 05/21/2021   Dysosmia 12/18/2019   Cough variant asthma vs uacs  04/13/2018   Fibromyalgia 04/19/2016   Other fatigue 04/19/2016   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   Primary osteoarthritis of both knees 04/19/2016   Leg pain 01/22/2016   Restless legs syndrome 01/22/2016   Snoring 11/19/2015   Periodic limb movement sleep disorder 11/19/2015   GERD (gastroesophageal reflux disease) 10/08/2015   Mild persistent asthma 04/09/2015   LPRD (laryngopharyngeal reflux disease) 04/09/2015   Allergic rhinoconjunctivitis 04/09/2015   HEMATOCHEZIA 04/01/2010   COUGH 04/01/2010   GASTRITIS 09/30/2009   NAUSEA 09/30/2009   ABDOMINAL PAIN-EPIGASTRIC 09/30/2009   DYSTHYMIC DISORDER 09/26/2009   PALPITATIONS 06/17/2009   Impingement syndrome of shoulder, left 06/04/2009   CONSTIPATION 02/05/2009   PERSONAL HX COLONIC POLYPS 02/05/2009   ASTHMA, PERSISTENT, MILD 02/04/2009   CHEST PAIN, ATYPICAL 02/04/2009   COLONIC POLYPS, BENIGN 12/18/2008   Hyperlipidemia LDL goal <70 12/18/2008   Insomnia 12/18/2008   MIGRAINE, COMMON 12/18/2008   Essential hypertension 12/18/2008   ALLERGIC RHINITIS 12/18/2008   Primary osteoarthritis of both hands 12/18/2008   DJD (degenerative joint disease), cervical 12/18/2008   NECK PAIN, CHRONIC 12/18/2008   LOW BACK PAIN, CHRONIC 12/18/2008   Past Medical History:  Diagnosis Date   Adenomatous colon polyp 10/1997, and 03/2009   Allergic rhinitis    Allergy    Aortic atherosclerosis (HCC)    Arthritis    osteo   Asthma    Chronic gastritis    Coronary artery calcification seen on CT scan    Fibromyalgia    GERD  (gastroesophageal reflux disease)    Hemorrhoids    Hyperlipidemia    Hypertension    Menorrhagia    partial hysterectomy   Migraines    Osteoarthritis    PONV (postoperative nausea and vomiting)    Premature atrial contractions    Sleep disturbance    STD (sexually transmitted disease) 01/22/11 culture proven   HSV type I labia    Family History  Problem Relation Age of Onset   Coronary artery disease Mother    Hypertension Mother    Hyperlipidemia Mother    Pulmonary fibrosis Mother    Lung disease Mother    Throat cancer Father    Coronary artery disease Father    Heart attack Father 73   Stroke Father  during procedure   Prostate cancer Brother    Heart disease Brother    Diabetes Brother    Diabetes Paternal Aunt        x 2   Prostate cancer Paternal Uncle    Stroke Maternal Grandmother        or MI   Diabetes Paternal Grandmother    Coronary artery disease Paternal 79    Migraines Daughter    Migraines Daughter    Colon cancer Neg Hx     Past Surgical History:  Procedure Laterality Date   CATARACT EXTRACTION Bilateral 10/16 & 11/16   EXCISION METACARPAL MASS Right 11/16/2016   Procedure: RIGHT LITTLE FINGER CYST EXCISION;  Surgeon: Milly Jakob, MD;  Location: [facility];  Service: Orthopedics;  Laterality: Right;   TONSILLECTOMY  age 12   tooth extract     with bone graft   Turbinate sinus surgery  2003   [place]   secondary to prolapse, ovaries remain   Social History   Occupational History   Occupation: Retired    Comment:  Metallurgist unemployed sincesince 6/10  Tobacco Use   Smoking status: Never    Passive exposure: Past   Smokeless tobacco: Never   Tobacco comments:    Father growing up  Vaping Use   Vaping Use: Never used  Substance and Sexual Activity   Alcohol use: No    Alcohol/week: 0.0 standard drinks of alcohol   Drug use: Never   Sexual activity: Yes     Partners: Male    Birth control/protection: Surgical    Comment: hysterectomy

## 2021-11-23 ENCOUNTER — Other Ambulatory Visit: Payer: Self-pay | Admitting: *Deleted

## 2021-11-23 MED ORDER — ALBUTEROL SULFATE HFA 108 (90 BASE) MCG/ACT IN AERS: 2.0000 | INHALATION_SPRAY | Freq: Four times a day (QID) | RESPIRATORY_TRACT | 2 refills | Status: DC | PRN

## 2021-12-05 NOTE — Progress Notes (Signed)
Cardiology Office Note:    Date:  12/09/2021   ID:  Christina Deleon, DOB 25-Aug-1950, MRN [id number]  PCP:  Wenda Low, MD  Cardiologist:  Sinclair Grooms, MD   Referring MD: Wenda Low, MD   Chief Complaint  Patient presents with   Hyperlipidemia   Hypertension   Coronary Artery Disease    History of Present Illness:    Christina Deleon is a 71 y.o. female with a hx of hypertension, hyperlipidemia., asthma diagnosed 2013, PAC's,  EF 42% with diastolic dysfunction, and elevated CAC Score (187).   She did not have asthma as a child.  States he was diagnosed 10 years ago.  She is having progressive dyspnea on exertion and also dyspnea at rest at times.  No chest discomfort.  Continues to have episodes of palpitation.  Palpitations were evaluated with monitor in 2021 and no arrhythmias were noted  Past Medical History:  Diagnosis Date   Adenomatous colon polyp 10/1997, and 03/2009   Allergic rhinitis    Allergy    Aortic atherosclerosis (HCC)    Arthritis    osteo   Asthma    Chronic gastritis    Coronary artery calcification seen on CT scan    Fibromyalgia    GERD (gastroesophageal reflux disease)    Hemorrhoids    Hyperlipidemia    Hypertension    Menorrhagia    partial hysterectomy   Migraines    Osteoarthritis    PONV (postoperative nausea and vomiting)    Premature atrial contractions    Sleep disturbance    STD (sexually transmitted disease) 01/22/11 culture proven   HSV type I labia    Past Surgical History:  Procedure Laterality Date   CATARACT EXTRACTION Bilateral 10/16 & 11/16   EXCISION METACARPAL MASS Right 11/16/2016   Procedure: RIGHT LITTLE FINGER CYST EXCISION;  Surgeon: Milly Jakob, MD;  Location: [facility];  Service: Orthopedics;  Laterality: Right;   TONSILLECTOMY  age 84   tooth extract     with bone graft   Turbinate sinus surgery  2003   [place]   secondary to prolapse, ovaries remain    Current  Medications: Current Meds  Medication Sig   albuterol (VENTOLIN HFA) 108 (90 Base) MCG/ACT inhaler Inhale 2 puffs into the lungs every 6 (six) hours as needed for wheezing or shortness of breath.   ALPRAZolam (XANAX) 0.5 MG tablet as needed.   Ascorbic Acid (VITAMIN C PO) Take 1 tablet by mouth daily.   Azelastine-Fluticasone 137-50 MCG/ACT SUSP SHAKE LIQUID AND USE ONE SPRAY IN EACH NOSTRIL ONE TO TWO TIMES DAILY.   CALCIUM PO Take 1 tablet by mouth daily.   candesartan (ATACAND) 16 MG tablet Take 8 mg by mouth at bedtime.   cetirizine (ZYRTEC) 10 MG tablet Take 1 tablet (10 mg total) by mouth daily. One tab daily for allergies   Cholecalciferol (VITAMIN D) 2000 units tablet Take 2,000 Units by mouth daily.   dapagliflozin propanediol (FARXIGA) 10 MG TABS tablet Take 1 tablet (10 mg total) by mouth daily before breakfast.   diclofenac (CATAFLAM) 50 MG tablet Take 1 tablet (50 mg total) by mouth 2 (two) times daily.   diclofenac Sodium (VOLTAREN) 1 % GEL Apply 2 g topically 4 (four) times daily.   famotidine (PEPCID) 40 MG tablet Take 1 tablet (40 mg total) by mouth at bedtime.   Fluticasone-Salmeterol 113-14 MCG/ACT AEPB Inhale two puffs one to two times per day to  prevent cough or wheeze.  Rinse, gargle, and spit after use.   Lifitegrast 5 % SOLN Apply 1 drop to eye 2 (two) times daily.   methocarbamol (ROBAXIN) 500 MG tablet TAKE 1 TABLET BY MOUTH DAILY AS NEEDED FOR MUSCLE SPASMS   montelukast (SINGULAIR) 10 MG tablet TAKE 1 TABLET(10 MG) BY MOUTH DAILY   pantoprazole (PROTONIX) 40 MG tablet TAKE 1 TABLET(40 MG) BY MOUTH TWICE DAILY   rosuvastatin (CRESTOR) 20 MG tablet TAKE 1 TABLET(20 MG) BY MOUTH DAILY   VITAMIN A PO Take by mouth daily.   zolmitriptan (ZOMIG-ZMT) 5 MG disintegrating tablet Take 1 tablet (5 mg total) by mouth as needed for migraine.   zolpidem (AMBIEN) 5 MG tablet TAKE 1 TABLET(5 MG) BY MOUTH AT BEDTIME AS NEEDED FOR SLEEP     Allergies:   Trazodone hcl,  Clindamycin/lincomycin, and Penicillins   Social History   Socioeconomic History   Marital status: Married    Spouse name: Not on file   Number of children: 2   Years of education: Not on file   Highest education level: Not on file  Occupational History   Occupation: Retired    Comment:  regulatory compliance specialist unemployed sincesince 6/10  Tobacco Use   Smoking status: Never    Passive exposure: Past   Smokeless tobacco: Never   Tobacco comments:    Father growing up  Vaping Use   Vaping Use: Never used  Substance and Sexual Activity   Alcohol use: No    Alcohol/week: 0.0 standard drinks of alcohol   Drug use: Never   Sexual activity: Yes    Partners: Male    Birth control/protection: Surgical    Comment: hysterectomy  Other Topics Concern   Not on file  Social History Narrative   She lives with husband.  They have 2 grown children.   She is retired Product/process development scientist.   Highest level of education:  2 years of college   Right handed   Regular exercise, diet of fruits, veggies, limited fried foods, limited water      [facility]:   Originally from [place]. Has also lived in [place]. Previously worked doing Software engineer. No pets currently. No bird, mold, or hot tub exposure. Does have a musty smell in their home but it is new. Enjoys reading. 1 indoor plant. Has carpet in her home including in the bedroom. Also has draperies.    Right handed   Social Determinants of Health   Financial Resource Strain: Not on file  Food Insecurity: Not on file  Transportation Needs: Not on file  Physical Activity: Not on file  Stress: Not on file  Social Connections: Not on file     Family History: The patient's family history includes Coronary artery disease in her father, mother, and paternal grandmother; Diabetes in her brother, paternal aunt, and paternal grandmother; Heart attack (age of onset: 96) in her father; Heart disease in her  brother; Hyperlipidemia in her mother; Hypertension in her mother; Lung disease in her mother; Migraines in her daughter and daughter; Prostate cancer in her brother and paternal uncle; Pulmonary fibrosis in her mother; Stroke in her father and maternal grandmother; Throat cancer in her father. There is no history of Colon cancer.  ROS:   Please see the history of present illness.    No claudication or new complaints.  All other systems reviewed and are negative.  EKGs/Labs/Other Studies Reviewed:    The following studies were reviewed today:  Coronary calcium score 2021: IMPRESSION: 1. Coronary calcium score of 177. This was 51 percentile for age and sex matched control.   2.  Descending aortic atherosclerosis.  Continuous monitor 2021 Study Highlights  Sinus rhythm Rare PAC's Symptom of flutter did not corrlate with significant arrhythmia. On one occasion there was an isolated [place] No sustained atrial or ventricular rhythms  ECHO 2021: IMPRESSIONS   1. Left ventricular ejection fraction, by estimation, is 60 to 65%. The  left ventricle has normal function. The left ventricle has no regional  wall motion abnormalities. Left ventricular diastolic parameters are  consistent with Grade I diastolic  dysfunction (impaired relaxation).   2. Right ventricular systolic function is normal. The right ventricular  size is normal.   3. The mitral valve is normal in structure. No evidence of mitral valve  regurgitation. No evidence of mitral stenosis.   4. The aortic valve is tricuspid. Aortic valve regurgitation is not  visualized. Mild aortic valve sclerosis is present, with no evidence of  aortic valve stenosis.   5. The inferior vena cava is normal in size with greater than 50%  respiratory variability, suggesting right atrial pressure of 3 mmHg.   EKG:  EKG normal sinus rhythm with normal-appearing EKG.  Recent Labs: No results found for requested labs within last 365 days.   Recent Lipid Panel    Component Value Date/Time   CHOL 130 12/08/2020 0855   TRIG 71 12/08/2020 0855   HDL 57 12/08/2020 0855   CHOLHDL 2.3 12/08/2020 0855   CHOLHDL 3 12/18/2008 1034   VLDL 16.8 12/18/2008 1034   LDLCALC 59 12/08/2020 0855    Physical Exam:    VS:  BP 118/68   Pulse 97   Ht 5' (1.524 m)   Wt 158 lb 6.4 oz (71.8 kg)   LMP 07/02/1994   SpO2 98%   BMI 30.94 kg/m     Wt Readings from Last 3 Encounters:  12/09/21 158 lb 6.4 oz (71.8 kg)  11/18/21 158 lb (71.7 kg)  08/04/21 156 lb 12.8 oz (71.1 kg)     GEN: Healthy appearing. No acute distress HEENT: Normal NECK: No JVD. LYMPHATICS: No lymphadenopathy CARDIAC: No murmur. RRR S4 without S3 gallop, or edema. VASCULAR:  Normal Pulses. No bruits. RESPIRATORY:  Clear to auscultation without rales, wheezing or rhonchi  ABDOMEN: Soft, non-tender, non-distended, No pulsatile mass, MUSCULOSKELETAL: No deformity  SKIN: Warm and dry NEUROLOGIC:  Alert and oriented x 3 PSYCHIATRIC:  Normal affect   ASSESSMENT:    1. Coronary artery calcification seen on CT scan   2. Essential hypertension   3. Hyperlipidemia LDL goal <70   4. DOE (dyspnea on exertion)   5. Palpitations    PLAN:    In order of problems listed above:  Aspirin 81 mg coated daily.  Continue statin therapy. Excellent BP control on current therapy. Continue statin therapy. S4 gallop, elevated left atrial pressure noted on echo in 2021 all raise the question of diastolic heart failure as a contributor to her shortness of breath.  It is limiting.  Start [place] or Jardiance 10 mg/day.  Follow-up in 4 to 6 weeks to determine if Wilder Glade has helped improve dyspnea. She is encouraged to purchase a Kardia mobile and to send [place] strips when she has sustained heart pounding/tachycardia.   She will need a 6-week follow-up on initiation of SGLT2 therapy.  Otherwise she needs a 1 year follow-up for preventative coaching.  1 year follow-up would need to be  with a newly assigned cardiologist.   Medication Adjustments/Labs and Tests Ordered: Current medicines are reviewed at length with the patient today.  Concerns regarding medicines are outlined above.  Orders Placed This Encounter  Procedures   Basic metabolic panel   EKG 21-HYQM   Meds ordered this encounter  Medications   dapagliflozin propanediol (FARXIGA) 10 MG TABS tablet    Sig: Take 1 tablet (10 mg total) by mouth daily before breakfast.    Dispense:  30 tablet    Refill:  11    Patient Instructions  Medication Instructions:  Your physician has recommended you make the following change in your medication:   1) START dapagliflozin propanediol (Farxiga) 10 mg daily  Please call [facility] at [phone] to see if you qualify for patient assistance to help with cost of medication Wilder Glade).  *If you need a refill on your cardiac medications before your next appointment, please call your pharmacy*  Lab Work: In 3 weeks: BMET If you have labs (blood work) drawn today and your tests are completely normal, you will receive your results only by: [place] (if you have MyChart) OR A paper copy in the mail If you have any lab test that is abnormal or we need to change your treatment, we will call you to review the results.  Testing/Procedures: NONE  Follow-Up: At [facility], you and your health needs are our priority.  As part of our continuing mission to provide you with exceptional heart care, we have created designated Provider Care Teams.  These Care Teams include your primary Cardiologist (physician) and Advanced Practice Providers (APPs -  Physician Assistants and Nurse Practitioners) who all work together to provide you with the care you need, when you need it.  Your next appointment:   2 month(s)  The format for your next appointment:   In Person  Provider:   Sinclair Grooms, MD  or Robbie Lis, PA-C, Nicholes Rough, PA-C, Ambrose Pancoast, NP, Ermalinda Barrios, PA-C, Christen Bame, NP, or Richardson Dopp, PA-C       Other Instructions Please consider getting a [facility] portable EKG, information on this product and how to purchase can be found at [email or web address]  Important Information About Sugar         Signed, Sinclair Grooms, MD  12/09/2021 11:21 AM    [place]

## 2021-12-09 ENCOUNTER — Ambulatory Visit (INDEPENDENT_AMBULATORY_CARE_PROVIDER_SITE_OTHER): Payer: Medicare Other | Admitting: Interventional Cardiology

## 2021-12-09 ENCOUNTER — Encounter: Payer: Self-pay | Admitting: Interventional Cardiology

## 2021-12-09 VITALS — BP 118/68 | HR 97 | Ht 60.0 in | Wt 158.4 lb

## 2021-12-09 DIAGNOSIS — R0609 Other forms of dyspnea: Secondary | ICD-10-CM | POA: Diagnosis not present

## 2021-12-09 DIAGNOSIS — E785 Hyperlipidemia, unspecified: Secondary | ICD-10-CM | POA: Diagnosis not present

## 2021-12-09 DIAGNOSIS — I2510 Atherosclerotic heart disease of native coronary artery without angina pectoris: Secondary | ICD-10-CM | POA: Diagnosis not present

## 2021-12-09 DIAGNOSIS — I10 Essential (primary) hypertension: Secondary | ICD-10-CM | POA: Diagnosis not present

## 2021-12-09 DIAGNOSIS — R002 Palpitations: Secondary | ICD-10-CM

## 2021-12-09 MED ORDER — DAPAGLIFLOZIN PROPANEDIOL 10 MG PO TABS: 10.0000 mg | ORAL_TABLET | Freq: Every day | ORAL | 11 refills | Status: DC

## 2021-12-09 NOTE — Patient Instructions (Addendum)
Medication Instructions:  Your physician has recommended you make the following change in your medication:   1) START dapagliflozin propanediol (Farxiga) 10 mg daily  Please call [facility] at [phone] to see if you qualify for patient assistance to help with cost of medication Wilder Glade).  *If you need a refill on your cardiac medications before your next appointment, please call your pharmacy*  Lab Work: In 3 weeks: BMET If you have labs (blood work) drawn today and your tests are completely normal, you will receive your results only by: [place] (if you have MyChart) OR A paper copy in the mail If you have any lab test that is abnormal or we need to change your treatment, we will call you to review the results.  Testing/Procedures: NONE  Follow-Up: At [facility], you and your health needs are our priority.  As part of our continuing mission to provide you with exceptional heart care, we have created designated Provider Care Teams.  These Care Teams include your primary Cardiologist (physician) and Advanced Practice Providers (APPs -  Physician Assistants and Nurse Practitioners) who all work together to provide you with the care you need, when you need it.  Your next appointment:   2 month(s)  The format for your next appointment:   In Person  Provider:   Sinclair Grooms, MD  or Robbie Lis, PA-C, Nicholes Rough, PA-C, Ambrose Pancoast, NP, Ermalinda Barrios, PA-C, Christen Bame, NP, or Richardson Dopp, PA-C       Other Instructions Please consider getting a [facility] portable EKG, information on this product and how to purchase can be found at [email or web address]  Important Information About Sugar

## 2021-12-11 ENCOUNTER — Telehealth: Payer: Self-pay

## 2021-12-11 NOTE — Telephone Encounter (Signed)
**Note De-Identified Khadeja Abt Obfuscation** Wilder Glade PA started through covermymeds. Key: [id number]

## 2021-12-14 ENCOUNTER — Other Ambulatory Visit: Payer: Self-pay | Admitting: Interventional Cardiology

## 2021-12-16 ENCOUNTER — Other Ambulatory Visit: Payer: Self-pay | Admitting: Rheumatology

## 2021-12-16 NOTE — Telephone Encounter (Signed)
Next Visit: 02/03/2022   Last Visit: 08/04/2021   Last Fill: 11/17/2021   Dx: Other insomnia    Current Dose per office note on 08/04/2021: Ambien 5 mg 1 tablet by mouth at bedtime for insomnia.     Okay to refill Ambien?

## 2022-01-07 ENCOUNTER — Other Ambulatory Visit: Payer: Medicare Other

## 2022-01-14 ENCOUNTER — Other Ambulatory Visit: Payer: Self-pay | Admitting: Rheumatology

## 2022-01-15 NOTE — Telephone Encounter (Signed)
Next Visit: 02/03/2022   Last Visit: 08/04/2021   Last Fill: 12/16/2021   Dx: Other insomnia    Current Dose per office note on 08/04/2021: Ambien 5 mg 1 tablet by mouth at bedtime for insomnia.     Okay to refill Ambien?

## 2022-01-20 ENCOUNTER — Ambulatory Visit: Payer: Medicare Other | Admitting: Allergy and Immunology

## 2022-01-20 ENCOUNTER — Encounter: Payer: Self-pay | Admitting: Allergy and Immunology

## 2022-01-20 VITALS — BP 142/72 | HR 84 | Resp 10 | Ht 62.5 in | Wt 158.0 lb

## 2022-01-20 DIAGNOSIS — K219 Gastro-esophageal reflux disease without esophagitis: Secondary | ICD-10-CM

## 2022-01-20 DIAGNOSIS — J3089 Other allergic rhinitis: Secondary | ICD-10-CM | POA: Diagnosis not present

## 2022-01-20 DIAGNOSIS — J4540 Moderate persistent asthma, uncomplicated: Secondary | ICD-10-CM | POA: Diagnosis not present

## 2022-01-20 MED ORDER — CARBINOXAMINE MALEATE 4 MG PO TABS: ORAL_TABLET | ORAL | 5 refills | Status: DC

## 2022-01-20 NOTE — Progress Notes (Unsigned)
Office Visit Note  Patient: Christina Deleon             Date of Birth: Jun 28, 1950           MRN: [id number]             PCP: Wenda Low, MD Referring: Wenda Low, MD Visit Date: 02/03/2022 Occupation: '@GUAROCC'$ @  Subjective:  Myalgias   History of Present Illness: Christina Deleon is a 71 y.o. female with history of fibromyalgia and DDD.  Patient reports that earlier this week she was performing yard work for prolonged period of time and since then has been experiencing increased myalgias and muscle tenderness.  She states that she is having some increased neck pain and stiffness as well as trapezius muscle tension and tenderness bilaterally.  She had trigger point injections on 08/04/2021 which provided significant relief and help to break her migraine cycle.  She is also having some increased discomfort in her lower back which she attributes to stooping and lifting while performing yard work.  She states she is been taking methocarbamol 500 mg 1 tablet daily as needed for muscle spasms which has been helpful.  She takes diclofenac 50 mg 1 tablet twice daily as needed for pain relief. She has been sleeping well taking Ambien 5 mg at bedtime for insomnia. She denies any new medical conditions.   Activities of Daily Living:  Patient reports morning stiffness for several hours.   Patient Reports nocturnal pain.  Difficulty dressing/grooming: Denies Difficulty climbing stairs: Reports Difficulty getting out of chair: Reports Difficulty using hands for taps, buttons, cutlery, and/or writing: Reports  Review of Systems  Constitutional:  Positive for fatigue.  HENT:  Negative for mouth sores and mouth dryness.   Eyes:  Positive for dryness.  Cardiovascular:  Negative for chest pain and palpitations.  Gastrointestinal:  Positive for constipation. Negative for blood in stool and diarrhea.  Endocrine: Negative for increased urination.  Genitourinary:  Negative for involuntary urination.   Musculoskeletal:  Positive for joint pain, joint pain, myalgias, muscle weakness, morning stiffness, muscle tenderness and myalgias. Negative for gait problem and joint swelling.  Skin:  Negative for color change, rash, hair loss and sensitivity to sunlight.  Neurological:  Negative for dizziness and headaches.  Hematological:  Negative for swollen glands.  Psychiatric/Behavioral:  Positive for sleep disturbance. Negative for depressed mood. The patient is nervous/anxious.     PMFS History:  Patient Active Problem List   Diagnosis Date Noted   Anxiety 05/21/2021   Dyslipidemia 05/21/2021   Dysphagia 05/21/2021   External hemorrhoids without complication [id number]/41/9379   Generalized anxiety disorder 05/21/2021   Hardening of the aorta (main artery of the heart) ([place]) 05/21/2021   History of hysterectomy 05/21/2021   Thyroid nodule 05/21/2021   Kidney stone 05/21/2021   Multinodular goiter 05/21/2021   Osteopenia 05/21/2021   Pure hypercholesterolemia 05/21/2021   Sciatica 05/21/2021   Stress due to family tension 05/21/2021   Vitamin D deficiency 05/21/2021   Cervical disc disease 05/21/2021   Unspecified thoracic, thoracolumbar and lumbosacral intervertebral disc disorder 05/21/2021   Dysosmia 12/18/2019   Cough variant asthma vs uacs  04/13/2018   Fibromyalgia 04/19/2016   Other fatigue 04/19/2016   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   Primary osteoarthritis of both knees 04/19/2016   Leg pain 01/22/2016   Restless legs syndrome 01/22/2016   Snoring 11/19/2015   Periodic limb movement sleep disorder 11/19/2015   GERD (gastroesophageal reflux disease) 10/08/2015   Mild persistent asthma  04/09/2015   LPRD (laryngopharyngeal reflux disease) 04/09/2015   Allergic rhinoconjunctivitis 04/09/2015   HEMATOCHEZIA 04/01/2010   COUGH 04/01/2010   GASTRITIS 09/30/2009   NAUSEA 09/30/2009   ABDOMINAL PAIN-EPIGASTRIC 09/30/2009   DYSTHYMIC DISORDER 09/26/2009   PALPITATIONS 06/17/2009    Impingement syndrome of shoulder, left 06/04/2009   CONSTIPATION 02/05/2009   PERSONAL HX COLONIC POLYPS 02/05/2009   ASTHMA, PERSISTENT, MILD 02/04/2009   CHEST PAIN, ATYPICAL 02/04/2009   COLONIC POLYPS, BENIGN 12/18/2008   Hyperlipidemia LDL goal <70 12/18/2008   Insomnia 12/18/2008   MIGRAINE, COMMON 12/18/2008   Essential hypertension 12/18/2008   ALLERGIC RHINITIS 12/18/2008   Primary osteoarthritis of both hands 12/18/2008   DJD (degenerative joint disease), cervical 12/18/2008   NECK PAIN, CHRONIC 12/18/2008   LOW BACK PAIN, CHRONIC 12/18/2008    Past Medical History:  Diagnosis Date   Adenomatous colon polyp 10/1997, and 03/2009   Allergic rhinitis    Allergy    Aortic atherosclerosis (HCC)    Arthritis    osteo   Asthma    Chronic gastritis    Coronary artery calcification seen on CT scan    Fibromyalgia    GERD (gastroesophageal reflux disease)    Hemorrhoids    Hyperlipidemia    Hypertension    Menorrhagia    partial hysterectomy   Migraines    Osteoarthritis    PONV (postoperative nausea and vomiting)    Premature atrial contractions    Sleep disturbance    STD (sexually transmitted disease) 01/22/11 culture proven   HSV type I labia    Family History  Problem Relation Age of Onset   Coronary artery disease Mother    Hypertension Mother    Hyperlipidemia Mother    Pulmonary fibrosis Mother    Lung disease Mother    Throat cancer Father    Coronary artery disease Father    Heart attack Father 33   Stroke Father        during procedure   Prostate cancer Brother    Heart disease Brother    Diabetes Brother    Diabetes Paternal Aunt        x 2   Prostate cancer Paternal Uncle    Stroke Maternal Grandmother        or MI   Diabetes Paternal Grandmother    Coronary artery disease Paternal 11    Migraines Daughter    Migraines Daughter    Colon cancer Neg Hx    Past Surgical History:  Procedure Laterality Date   CATARACT EXTRACTION  Bilateral 10/16 & 11/16   EXCISION METACARPAL MASS Right 11/16/2016   Procedure: RIGHT LITTLE FINGER CYST EXCISION;  Surgeon: Milly Jakob, MD;  Location: [facility];  Service: Orthopedics;  Laterality: Right;   TONSILLECTOMY  age 74   tooth extract     with bone graft   Turbinate sinus surgery  2003   [place]   secondary to prolapse, ovaries remain   Social History   Social History Narrative   She lives with husband.  They have 2 grown children.   She is retired Product/process development scientist.   Highest level of education:  2 years of college   Right handed   Regular exercise, diet of fruits, veggies, limited fried foods, limited water      [facility]:   Originally from [place]. Has also lived in [place]. Previously worked doing Software engineer. No pets currently. No bird, mold, or hot tub exposure.  Does have a musty smell in their home but it is new. Enjoys reading. 1 indoor plant. Has carpet in her home including in the bedroom. Also has draperies.    Right handed   Immunization History  Administered Date(s) Administered   Influenza Split 04/02/2014   Influenza, Seasonal, Injecte, Preservative Fre 04/09/2015   Influenza,inj,Quad PF,6+ Mos 01/14/2016, 03/04/2017, 01/24/2018   Influenza-Unspecified 02/08/2019, 02/25/2020, 02/17/2021   PFIZER(Purple Top)SARS-COV-2 Vaccination 06/23/2019, 07/18/2019, 04/04/2020   Pneumococcal Conjugate-13 11/01/2015, 11/25/2015   Pneumococcal Polysaccharide-23 11/20/2013   Tdap 11/20/2009     Objective: Vital Signs: BP 139/78 (BP Location: Left Arm, Patient Position: Sitting, Cuff Size: Normal)   Pulse 80   Resp 14   Ht '5\' 4"'$  (1.626 m)   Wt 157 lb (71.2 kg)   LMP 07/02/1994   BMI 26.95 kg/m    Physical Exam Vitals and nursing note reviewed.  Constitutional:      Appearance: She is well-developed.  HENT:     Head: Normocephalic and atraumatic.  Eyes:     Conjunctiva/sclera:  Conjunctivae normal.  Cardiovascular:     Rate and Rhythm: Normal rate and regular rhythm.     Heart sounds: Normal heart sounds.  Pulmonary:     Effort: Pulmonary effort is normal.     Breath sounds: Normal breath sounds.  Abdominal:     General: Bowel sounds are normal.     Palpations: Abdomen is soft.  Musculoskeletal:     Cervical back: Normal range of motion.  Skin:    General: Skin is warm and dry.     Capillary Refill: Capillary refill takes less than 2 seconds.  Neurological:     Mental Status: She is alert and oriented to person, place, and time.  Psychiatric:        Behavior: Behavior normal.      Musculoskeletal Exam: C-spine has good range of motion.  Trapezius muscle tension and tenderness bilaterally.  Some midline spinal tenderness in the lumbar region.  Some paraspinal muscle tenderness in the lumbar region noted as well.  Shoulder joints, elbow joints, wrist joints, MCPs, PIPs, DIPs have good range of motion with no synovitis.  DIP prominence noted.  Hip joints have good range of motion with no groin pain.  Tenderness palpation over bilateral trochanteric bursa.  Knee joints have good range of motion with no warmth or effusion.  Ankle joints have good range of motion with no tenderness or joint swelling.  CDAI Exam: CDAI Score: -- Patient Global: --; Provider Global: -- Swollen: --; Tender: -- Joint Exam 02/03/2022   No joint exam has been documented for this visit   There is currently no information documented on the homunculus. Go to the Rheumatology activity and complete the homunculus joint exam.  Investigation: No additional findings.  Imaging: No results found.  Recent Labs: Lab Results  Component Value Date   WBC 4.1 12/08/2020   HGB 12.5 12/08/2020   PLT 262 12/08/2020   NA 144 12/08/2020   K 4.1 12/08/2020   CL 107 (H) 12/08/2020   CO2 25 12/08/2020   GLUCOSE 101 (H) 12/08/2020   BUN 15 12/08/2020   CREATININE 0.92 12/08/2020   BILITOT  <0.2 12/08/2020   ALKPHOS 77 12/08/2020   AST 20 12/08/2020   ALT 13 12/08/2020   PROT 6.5 12/08/2020   ALBUMIN 3.8 12/08/2020   CALCIUM 9.2 12/08/2020   GFRAA 87 08/05/2020    Speciality Comments: No specialty comments available.  Procedures:  No procedures performed Allergies:  Trazodone hcl, Clindamycin/lincomycin, and Penicillins   Assessment / Plan:     Visit Diagnoses: Fibromyalgia: She has generalized hyperalgesia and positive tender points on exam.  She presents today with trapezius muscle tension and tenderness bilaterally.  She was performing yard work several days ago and is currently having a flare.  She has been taking methocarbamol 500 mg 1 tablet daily as needed for muscle spasms.  She had trigger point injections on 08/04/2021 which provided significant relief as well as helped to break her migraine cycle.  She declined trigger point injections today. Discussed the importance of regular exercise, good sleep hygiene, and stress management.  She continues to take Ambien 5 mg 1 tablet at bedtime as needed for insomnia. She will follow-up in the office in 6 months or sooner if needed.  Other insomnia - She is taking Ambien 5 mg 1 tablet by mouth at bedtime for insomnia.  Discussed the importance of good sleep hygiene.  Other fatigue: Chronic and secondary to insomnia.  Discussed the importance of regular exercise and good sleep hygiene.  Neck pain: She had bilateral trigger point injections performed on 08/04/2021 which provided significant relief as well as help to break her migraine cycle.  On examination today she has trapezius muscle tension and tenderness bilaterally.  A refill of methocarbamol was sent to the pharmacy today.  DDD (degenerative disc disease), cervical - Followed by Dr. Louanne Skye.  She had updated x-rays of the C-spine on 10/15/2020 which revealed generalized disc narrowing and minimal anterior listhesis C7-T1.  No symptoms of radiculopathy at this time.  She has a  prescription for diclofenac 50 mg 1 tablet twice daily as needed for pain relief prescribed by Dr. Louanne Skye.   DDD lumbar spine: She has been experiencing some increased lower back pain which she attributes to over use and strenuous activities performing yard work the other day.  She is having some increased paraspinal muscle tenderness in her lower back currently.  No symptoms of radiculopathy.  A refill of methocarbamol sent to the pharmacy.  Chronic pain of both shoulders - History of left shoulder impingement.  She has good range of motion of the shoulder joints on examination today.  Primary osteoarthritis of both hands: DIP prominence consistent with osteoarthritis of both hands.  No synovitis noted.  Complete fist formation bilaterally.  Discussed the importance of joint protection and muscle strengthening.  Trochanteric bursitis of both hips: She has tenderness palpation over bilateral trochanteric bursa.  Discussed the importance of performing stretching exercises daily.  Primary osteoarthritis of both knees: She has good range of motion of both knee joints on examination.  No warmth or effusion noted.  Plantar fasciitis, right: Resolved.  Other medical conditions are listed as follows:  History of hypertension: Blood pressure was 139/78 today in the office.  History of gastroesophageal reflux (GERD)  History of colon polyps  History of hyperlipidemia  History of asthma  History of migraine  Orders: No orders of the defined types were placed in this encounter.  Meds ordered this encounter  Medications   methocarbamol (ROBAXIN) 500 MG tablet    Sig: TAKE 1 TABLET BY MOUTH DAILY AS NEEDED FOR MUSCLE SPASMS    Dispense:  30 tablet    Refill:  2     Follow-Up Instructions: Return in about 6 months (around 08/05/2022) for Fibromyalgia, DDD.   Ofilia Neas, PA-C  Note - This record has been created using Dragon software.  Chart creation errors have been sought, but  may  not always  have been located. Such creation errors do not reflect on  the standard of medical care.

## 2022-01-20 NOTE — Patient Instructions (Addendum)
  1. Continue to treat reflux/LPR:    A. Pantoprazole '40mg'$  2 times a day  B. Famotidine 40 mg in evening   C. Replace throat clearing with swallowing/drinking maneuver  2. Continue to treat and prevent inflammation:   A. Dymista -1 spray each nostril 1-2 times per day  B. montelukast '10mg'$  -1 time per day  C. Advair 115 HFA - 2 inhalations 2 times per day (empty lungs)  3. If needed:   A. ProAir HFA  B. Zyrtec 10 mg daily or Carbinoxamine 4 mg - 1-2 tablets up to 3 times per day  C. Clotrimazole troches - 1 time per day  D. nasal saline    4.  Obtain fall flu vaccine and RSV vaccine.  5.  Return to clinic in 6 months or earlier if problem

## 2022-01-20 NOTE — Progress Notes (Unsigned)
[facility]   Follow-up Note  Referring Provider: Wenda Low, MD Primary Provider: Wenda Low, MD Date of Office Visit: 01/20/2022  Subjective:   Christina Deleon (DOB: 07-23-1950) is a 71 y.o. female who returns to the [facility] on 01/20/2022 in re-evaluation of the following:  HPI: Christina Deleon returns to this clinic in evaluation of asthma, allergic rhinitis, LPR and history of recurrent thrush.  Her last visit to this clinic was 15 July 2021.  She continues to have some cough which occasionally does disturb her sleep.  She is coughing because she cannot clear out mucus from behind her chest and her throat.  She has lots of throat clearing.  This occurs even though she has been been aggressively treating her reflux and has been using an inhaled steroid.  She is using a combination inhaler only 1 time per day.  She is using a proton pump inhibitor only 1 time per day but does supplement with famotidine at nighttime.  She had no problems with recurrent thrush while using her clotrimazole.  She has had no classic reflux symptoms.  Allergies as of 01/20/2022       Reactions   Trazodone Hcl Other (See Comments)   REACTION: bad headaches   Clindamycin/lincomycin Other (See Comments)   headaches   Penicillins Nausea Only, Rash   Patient has taken Amoxicillin without an issue, per patient. Has patient had a PCN reaction causing immediate rash, facial/tongue/throat swelling, SOB or lightheadedness with hypotension: No Has patient had a PCN reaction causing severe rash involving mucus membranes or skin necrosis: No Has patient had a PCN reaction that required hospitalization No Has patient had a PCN reaction occurring within the last 10 years: No If all of the above answers are "NO", then may proceed with Cephalosporin use.        Medication List    albuterol 108 (90 Base) MCG/ACT inhaler Commonly known as: VENTOLIN  HFA Inhale 2 puffs into the lungs every 6 (six) hours as needed for wheezing or shortness of breath.   ALPRAZolam 0.5 MG tablet Commonly known as: XANAX as needed.   Azelastine-Fluticasone 137-50 MCG/ACT Susp SHAKE LIQUID AND USE ONE SPRAY IN EACH NOSTRIL ONE TO TWO TIMES DAILY.   CALCIUM PO Take 1 tablet by mouth daily.   candesartan 16 MG tablet Commonly known as: ATACAND Take 8 mg by mouth at bedtime.   cetirizine 10 MG tablet Commonly known as: ZYRTEC Take 1 tablet (10 mg total) by mouth daily. One tab daily for allergies   clotrimazole 10 MG troche Commonly known as: MYCELEX SMARTSIG:1 Lozenge(s) By Mouth Daily PRN   dapagliflozin propanediol 10 MG Tabs tablet Commonly known as: FARXIGA Take 1 tablet (10 mg total) by mouth daily before breakfast.   diclofenac 50 MG tablet Commonly known as: CATAFLAM Take 1 tablet (50 mg total) by mouth 2 (two) times daily.   diclofenac Sodium 1 % Gel Commonly known as: VOLTAREN Apply 2 g topically 4 (four) times daily.   famotidine 40 MG tablet Commonly known as: Pepcid Take 1 tablet (40 mg total) by mouth at bedtime.   Fluticasone-Salmeterol 113-14 MCG/ACT Aepb Inhale two puffs one to two times per day to prevent cough or wheeze.  Rinse, gargle, and spit after use.   Lifitegrast 5 % Soln Apply 1 drop to eye 2 (two) times daily.   methocarbamol 500 MG tablet Commonly known as: ROBAXIN TAKE 1 TABLET BY  MOUTH DAILY AS NEEDED FOR MUSCLE SPASMS   montelukast 10 MG tablet Commonly known as: SINGULAIR TAKE 1 TABLET(10 MG) BY MOUTH DAILY   pantoprazole 40 MG tablet Commonly known as: PROTONIX TAKE 1 TABLET(40 MG) BY MOUTH TWICE DAILY   rosuvastatin 20 MG tablet Commonly known as: CRESTOR TAKE 1 TABLET(20 MG) BY MOUTH DAILY   VITAMIN A PO Take by mouth daily.   VITAMIN C PO Take 1 tablet by mouth daily.   Vitamin D 50 MCG (2000 UT) tablet Take 2,000 Units by mouth daily.   zolmitriptan 5 MG disintegrating  tablet Commonly known as: ZOMIG-ZMT Take 1 tablet (5 mg total) by mouth as needed for migraine.   zolpidem 5 MG tablet Commonly known as: AMBIEN TAKE 1 TABLET(5 MG) BY MOUTH AT BEDTIME AS NEEDED FOR SLEEP    Past Medical History:  Diagnosis Date   Adenomatous colon polyp 10/1997, and 03/2009   Allergic rhinitis    Allergy    Aortic atherosclerosis (HCC)    Arthritis    osteo   Asthma    Chronic gastritis    Coronary artery calcification seen on CT scan    Fibromyalgia    GERD (gastroesophageal reflux disease)    Hemorrhoids    Hyperlipidemia    Hypertension    Menorrhagia    partial hysterectomy   Migraines    Osteoarthritis    PONV (postoperative nausea and vomiting)    Premature atrial contractions    Sleep disturbance    STD (sexually transmitted disease) 01/22/11 culture proven   HSV type I labia    Past Surgical History:  Procedure Laterality Date   CATARACT EXTRACTION Bilateral 10/16 & 11/16   EXCISION METACARPAL MASS Right 11/16/2016   Procedure: RIGHT LITTLE FINGER CYST EXCISION;  Surgeon: Milly Jakob, MD;  Location: [facility];  Service: Orthopedics;  Laterality: Right;   TONSILLECTOMY  age 89   tooth extract     with bone graft   Turbinate sinus surgery  2003   [place]   secondary to prolapse, ovaries remain    Review of systems negative except as noted in HPI / PMHx or noted below:  Review of Systems  Constitutional: Negative.   HENT: Negative.    Eyes: Negative.   Respiratory: Negative.    Cardiovascular: Negative.   Gastrointestinal: Negative.   Genitourinary: Negative.   Musculoskeletal: Negative.   Skin: Negative.   Neurological: Negative.   Endo/Heme/Allergies: Negative.   Psychiatric/Behavioral: Negative.       Objective:   Vitals:   01/20/22 1227  BP: (!) 142/72  Pulse: 84  Resp: 10  SpO2: 98%   Height: 5' 2.5" (158.8 cm)  Weight: 158 lb (71.7 kg)   Physical Exam Constitutional:       Appearance: She is not diaphoretic.  HENT:     Head: Normocephalic.     Right Ear: Tympanic membrane, ear canal and external ear normal.     Left Ear: Tympanic membrane, ear canal and external ear normal.     Nose: Nose normal. No mucosal edema or rhinorrhea.     Mouth/Throat:     Pharynx: Uvula midline. No oropharyngeal exudate.  Eyes:     Conjunctiva/sclera: Conjunctivae normal.  Neck:     Thyroid: No thyromegaly.     Trachea: Trachea normal. No tracheal tenderness or tracheal deviation.  Cardiovascular:     Rate and Rhythm: Normal rate and regular rhythm.     Heart sounds: Normal heart  sounds, S1 normal and S2 normal. No murmur heard. Pulmonary:     Effort: No respiratory distress.     Breath sounds: Normal breath sounds. No stridor. No wheezing or rales.  Lymphadenopathy:     Head:     Right side of head: No tonsillar adenopathy.     Left side of head: No tonsillar adenopathy.     Cervical: No cervical adenopathy.  Skin:    Findings: No erythema or rash.     Nails: There is no clubbing.  Neurological:     Mental Status: She is alert.     Diagnostics:    Spirometry was performed and demonstrated an FEV1 of 1.71 at 97 % of predicted.  Assessment and Plan:   1. Not well controlled moderate persistent asthma   2. Other allergic rhinitis   3. LPRD (laryngopharyngeal reflux disease)    1. Continue to treat reflux/LPR:    A. Pantoprazole '40mg'$  2 times a day  B. Famotidine 40 mg in evening   C. Replace throat clearing with swallowing/drinking maneuver  2. Continue to treat and prevent inflammation:   A. Dymista -1 spray each nostril 1-2 times per day  B. montelukast '10mg'$  -1 time per day  C. Advair 115 HFA - 2 inhalations 2 times per day (empty lungs)  3. If needed:   A. ProAir HFA  B. Zyrtec 10 mg daily or Carbinoxamine 4 mg - 1-2 tablets up to 3 times per day  C. Clotrimazole troches - 1 time per day  D. nasal saline    4.  Obtain fall flu vaccine and RSV  vaccine.  5.  Return to clinic in 6 months or earlier if problem   Jared still continues to have symptoms consistent with respiratory tract irritation and possibly some inflammation.  She has always had a difficult time with cough.  We will have her be more consistent about aggressively treating her LPR by making sure she uses her pantoprazole twice a day and continues on famotidine and she will need to replace her throat clearing with swallowing or drinking maneuver.  She should use her Advair twice a day on a consistent basis.  I given her some carbinoxamine to see if this can help with cough suppression as it may dry up some of the mucus in her throat.  I will see her back in this clinic in 6 months or earlier if there is a problem.  Allena Katz, MD Allergy / Immunology [place]

## 2022-01-21 ENCOUNTER — Encounter: Payer: Self-pay | Admitting: Allergy and Immunology

## 2022-02-03 ENCOUNTER — Ambulatory Visit: Payer: Medicare Other | Attending: Physician Assistant | Admitting: Physician Assistant

## 2022-02-03 ENCOUNTER — Encounter: Payer: Self-pay | Admitting: Physician Assistant

## 2022-02-03 VITALS — BP 139/78 | HR 80 | Resp 14 | Ht 64.0 in | Wt 157.0 lb

## 2022-02-03 DIAGNOSIS — M25512 Pain in left shoulder: Secondary | ICD-10-CM

## 2022-02-03 DIAGNOSIS — M797 Fibromyalgia: Secondary | ICD-10-CM

## 2022-02-03 DIAGNOSIS — M722 Plantar fascial fibromatosis: Secondary | ICD-10-CM

## 2022-02-03 DIAGNOSIS — M7062 Trochanteric bursitis, left hip: Secondary | ICD-10-CM

## 2022-02-03 DIAGNOSIS — Z860100 Personal history of colon polyps, unspecified: Secondary | ICD-10-CM

## 2022-02-03 DIAGNOSIS — Z8679 Personal history of other diseases of the circulatory system: Secondary | ICD-10-CM

## 2022-02-03 DIAGNOSIS — R5383 Other fatigue: Secondary | ICD-10-CM

## 2022-02-03 DIAGNOSIS — M542 Cervicalgia: Secondary | ICD-10-CM | POA: Diagnosis not present

## 2022-02-03 DIAGNOSIS — M5030 Other cervical disc degeneration, unspecified cervical region: Secondary | ICD-10-CM

## 2022-02-03 DIAGNOSIS — G4709 Other insomnia: Secondary | ICD-10-CM | POA: Diagnosis not present

## 2022-02-03 DIAGNOSIS — M170 Bilateral primary osteoarthritis of knee: Secondary | ICD-10-CM

## 2022-02-03 DIAGNOSIS — G8929 Other chronic pain: Secondary | ICD-10-CM

## 2022-02-03 DIAGNOSIS — Z8669 Personal history of other diseases of the nervous system and sense organs: Secondary | ICD-10-CM

## 2022-02-03 DIAGNOSIS — M25511 Pain in right shoulder: Secondary | ICD-10-CM

## 2022-02-03 DIAGNOSIS — M19041 Primary osteoarthritis, right hand: Secondary | ICD-10-CM

## 2022-02-03 DIAGNOSIS — Z8709 Personal history of other diseases of the respiratory system: Secondary | ICD-10-CM

## 2022-02-03 DIAGNOSIS — Z8639 Personal history of other endocrine, nutritional and metabolic disease: Secondary | ICD-10-CM

## 2022-02-03 DIAGNOSIS — M7061 Trochanteric bursitis, right hip: Secondary | ICD-10-CM

## 2022-02-03 DIAGNOSIS — Z8719 Personal history of other diseases of the digestive system: Secondary | ICD-10-CM

## 2022-02-03 DIAGNOSIS — M19042 Primary osteoarthritis, left hand: Secondary | ICD-10-CM

## 2022-02-03 DIAGNOSIS — M47816 Spondylosis without myelopathy or radiculopathy, lumbar region: Secondary | ICD-10-CM

## 2022-02-03 MED ORDER — METHOCARBAMOL 500 MG PO TABS
ORAL_TABLET | ORAL | 2 refills | Status: AC
Start: 2022-02-03 — End: ?

## 2022-02-11 ENCOUNTER — Telehealth: Payer: Self-pay | Admitting: Interventional Cardiology

## 2022-02-11 NOTE — Telephone Encounter (Signed)
Left a message for the pt to call back.  

## 2022-02-11 NOTE — Telephone Encounter (Signed)
Patient calling to see if she needs to keep her appt on Monday. Patient hasnt gotten the meds that he told her to get or the device that dr Tamala Julian suggest. Please advise

## 2022-02-12 NOTE — Progress Notes (Unsigned)
Cardiology Office Note:    Date:  02/15/2022   ID:  Christina Deleon, DOB 1950/11/26, MRN [id number]  PCP:  Wenda Low, MD  Cardiologist:  Sinclair Grooms, MD   Referring MD: Wenda Low, MD   Chief Complaint  Patient presents with   Coronary Artery Disease   Shortness of Breath   Hyperlipidemia    History of Present Illness:    Christina Deleon is a 71 y.o. female with a hx of  hypertension, hyperlipidemia., asthma diagnosed 2013, PAC's,  EF 42% with diastolic dysfunction, and elevated CAC Score (187).  On 10/13, she wondered if she should come since she has not started med or purchased a [employer],  Overall she is doing well and feels well.  She did not feel she was having ample symptoms to purchase a device to monitor her rhythm.  She feels her shortness of breath is related to asthma and the shortness of breath that she feels now is what she has always had over the last 20 to 30 years.  There is no orthopnea PND.  She has not had any sustained palpitations.  She denies chest pain.  We reviewed primary prevention measures.  We reviewed her echo and discussed diastolic dysfunction in the context of her shortness of breath although we both agree that her current shortness of breath is likely asthma related because it has variable threshold and sometimes she has no shortness of breath at all depending upon the environment.  Past Medical History:  Diagnosis Date   Adenomatous colon polyp 10/1997, and 03/2009   Allergic rhinitis    Allergy    Aortic atherosclerosis (HCC)    Arthritis    osteo   Asthma    Chronic gastritis    Coronary artery calcification seen on CT scan    Fibromyalgia    GERD (gastroesophageal reflux disease)    Hemorrhoids    Hyperlipidemia    Hypertension    Menorrhagia    partial hysterectomy   Migraines    Osteoarthritis    PONV (postoperative nausea and vomiting)    Premature atrial contractions    Sleep disturbance    STD (sexually  transmitted disease) 01/22/11 culture proven   HSV type I labia    Past Surgical History:  Procedure Laterality Date   CATARACT EXTRACTION Bilateral 10/16 & 11/16   EXCISION METACARPAL MASS Right 11/16/2016   Procedure: RIGHT LITTLE FINGER CYST EXCISION;  Surgeon: Milly Jakob, MD;  Location: [facility];  Service: Orthopedics;  Laterality: Right;   TONSILLECTOMY  age 62   tooth extract     with bone graft   Turbinate sinus surgery  2003   [place]   secondary to prolapse, ovaries remain    Current Medications: Current Meds  Medication Sig   albuterol (VENTOLIN HFA) 108 (90 Base) MCG/ACT inhaler Inhale 2 puffs into the lungs every 6 (six) hours as needed for wheezing or shortness of breath.   ALPRAZolam (XANAX) 0.5 MG tablet as needed.   Ascorbic Acid (VITAMIN C PO) Take 1 tablet by mouth daily.   Azelastine-Fluticasone 137-50 MCG/ACT SUSP SHAKE LIQUID AND USE ONE SPRAY IN EACH NOSTRIL ONE TO TWO TIMES DAILY.   CALCIUM PO Take 1 tablet by mouth daily.   candesartan (ATACAND) 16 MG tablet Take 8 mg by mouth at bedtime.   Carbinoxamine Maleate 4 MG TABS Can take one to two tablets up to three times daily if needed.  cetirizine (ZYRTEC) 10 MG tablet Take 1 tablet (10 mg total) by mouth daily. One tab daily for allergies   Cholecalciferol (VITAMIN D) 2000 units tablet Take 2,000 Units by mouth daily.   clotrimazole (MYCELEX) 10 MG troche    diclofenac (CATAFLAM) 50 MG tablet Take 1 tablet (50 mg total) by mouth 2 (two) times daily.   diclofenac Sodium (VOLTAREN) 1 % GEL Apply 2 g topically 4 (four) times daily.   famotidine (PEPCID) 40 MG tablet Take 1 tablet (40 mg total) by mouth at bedtime.   Fluticasone-Salmeterol 113-14 MCG/ACT AEPB Inhale two puffs one to two times per day to prevent cough or wheeze.  Rinse, gargle, and spit after use.   Lifitegrast 5 % SOLN Apply 1 drop to eye 2 (two) times daily.   methocarbamol (ROBAXIN) 500 MG tablet TAKE 1  TABLET BY MOUTH DAILY AS NEEDED FOR MUSCLE SPASMS   montelukast (SINGULAIR) 10 MG tablet TAKE 1 TABLET(10 MG) BY MOUTH DAILY   pantoprazole (PROTONIX) 40 MG tablet TAKE 1 TABLET(40 MG) BY MOUTH TWICE DAILY   rosuvastatin (CRESTOR) 20 MG tablet TAKE 1 TABLET(20 MG) BY MOUTH DAILY   VITAMIN A PO Take by mouth daily.   zolmitriptan (ZOMIG-ZMT) 5 MG disintegrating tablet Take 1 tablet (5 mg total) by mouth as needed for migraine.   zolpidem (AMBIEN) 5 MG tablet TAKE 1 TABLET(5 MG) BY MOUTH AT BEDTIME AS NEEDED FOR SLEEP     Allergies:   Trazodone hcl, Clindamycin/lincomycin, and Penicillins   Social History   Socioeconomic History   Marital status: Married    Spouse name: Not on file   Number of children: 2   Years of education: Not on file   Highest education level: Not on file  Occupational History   Occupation: Retired    Comment:  regulatory compliance specialist unemployed sincesince 6/10  Tobacco Use   Smoking status: Never    Passive exposure: Past   Smokeless tobacco: Never   Tobacco comments:    Father growing up  Vaping Use   Vaping Use: Never used  Substance and Sexual Activity   Alcohol use: No    Alcohol/week: 0.0 standard drinks of alcohol   Drug use: Never   Sexual activity: Yes    Partners: Male    Birth control/protection: Surgical    Comment: hysterectomy  Other Topics Concern   Not on file  Social History Narrative   She lives with husband.  They have 2 grown children.   She is retired Product/process development scientist.   Highest level of education:  2 years of college   Right handed   Regular exercise, diet of fruits, veggies, limited fried foods, limited water      [facility]:   Originally from [place]. Has also lived in [place]. Previously worked doing Software engineer. No pets currently. No bird, mold, or hot tub exposure. Does have a musty smell in their home but it is new. Enjoys reading. 1 indoor plant. Has carpet in her home including  in the bedroom. Also has draperies.    Right handed   Social Determinants of Health   Financial Resource Strain: Not on file  Food Insecurity: Not on file  Transportation Needs: Not on file  Physical Activity: Not on file  Stress: Not on file  Social Connections: Not on file     Family History: The patient's family history includes Coronary artery disease in her father, mother, and paternal grandmother; Diabetes in her brother, paternal  aunt, and paternal grandmother; Heart attack (age of onset: 90) in her father; Heart disease in her brother; Hyperlipidemia in her mother; Hypertension in her mother; Lung disease in her mother; Migraines in her daughter and daughter; Prostate cancer in her brother and paternal uncle; Pulmonary fibrosis in her mother; Stroke in her father and maternal grandmother; Throat cancer in her father. There is no history of Colon cancer.  ROS:   Please see the history of present illness.    No ankle swelling, syncope, transient neurological symptoms.  All other systems reviewed and are negative.  EKGs/Labs/Other Studies Reviewed:    The following studies were reviewed today: Please refer to prior cardiac database.  EKG:  EKG not repeated  Recent Labs: No results found for requested labs within last 365 days.  Recent Lipid Panel    Component Value Date/Time   CHOL 130 12/08/2020 0855   TRIG 71 12/08/2020 0855   HDL 57 12/08/2020 0855   CHOLHDL 2.3 12/08/2020 0855   CHOLHDL 3 12/18/2008 1034   VLDL 16.8 12/18/2008 1034   LDLCALC 59 12/08/2020 0855    Physical Exam:    VS:  BP 126/72   Pulse 96   Ht '5\' 4"'$  (1.626 m)   Wt 159 lb (72.1 kg)   LMP 07/02/1994   SpO2 98%   BMI 27.29 kg/m     Wt Readings from Last 3 Encounters:  02/15/22 159 lb (72.1 kg)  02/03/22 157 lb (71.2 kg)  01/20/22 158 lb (71.7 kg)     GEN: Healthy appearing. No acute distress HEENT: Normal NECK: No JVD. LYMPHATICS: No lymphadenopathy CARDIAC: No murmur. RRR no  gallop, or edema. VASCULAR:  Normal Pulses. No bruits. RESPIRATORY:  Clear to auscultation without rales, wheezing or rhonchi   ASSESSMENT:    1. Coronary artery calcification seen on CT scan   2. Essential hypertension   3. Hyperlipidemia LDL goal <70   4. DOE (dyspnea on exertion)    PLAN:    In order of problems listed above:  Asymptomatic.  Primary prevention discussed. Excellent control on current therapy.  Target 130/80 mmHg. Target LDL less than 70 given coronary calcium score. She will continue to observe.  Likely related to reactive airways disease.  This has been a chronic complaint.  It is not different than prior.  Overall education and awareness concerning secondary risk prevention was discussed in detail: LDL less than 70, hemoglobin A1c less than 7, blood pressure target less than 130/80 mmHg, >150 minutes of moderate aerobic activity per week, avoidance of smoking, weight control (via diet and exercise), and continued surveillance/management of/for obstructive sleep apnea.  As needed follow-up.  Medication Adjustments/Labs and Tests Ordered: Current medicines are reviewed at length with the patient today.  Concerns regarding medicines are outlined above.  No orders of the defined types were placed in this encounter.  No orders of the defined types were placed in this encounter.   There are no Patient Instructions on file for this visit.   Signed, Sinclair Grooms, MD  02/15/2022 8:33 AM    [facility]

## 2022-02-15 ENCOUNTER — Ambulatory Visit: Payer: Medicare Other | Attending: Interventional Cardiology | Admitting: Interventional Cardiology

## 2022-02-15 ENCOUNTER — Encounter: Payer: Self-pay | Admitting: Interventional Cardiology

## 2022-02-15 VITALS — BP 126/72 | HR 96 | Ht 64.0 in | Wt 159.0 lb

## 2022-02-15 DIAGNOSIS — I2510 Atherosclerotic heart disease of native coronary artery without angina pectoris: Secondary | ICD-10-CM | POA: Diagnosis not present

## 2022-02-15 DIAGNOSIS — I10 Essential (primary) hypertension: Secondary | ICD-10-CM

## 2022-02-15 DIAGNOSIS — R0609 Other forms of dyspnea: Secondary | ICD-10-CM | POA: Diagnosis not present

## 2022-02-15 DIAGNOSIS — E785 Hyperlipidemia, unspecified: Secondary | ICD-10-CM | POA: Diagnosis not present

## 2022-02-15 NOTE — Patient Instructions (Signed)
Medication Instructions:  Your physician recommends that you continue on your current medications as directed. Please refer to the Current Medication list given to you today.  *If you need a refill on your cardiac medications before your next appointment, please call your pharmacy*  Lab Work: NONE  Testing/Procedures: NONE  Follow-Up: As needed  Important Information About Sugar       

## 2022-02-16 ENCOUNTER — Other Ambulatory Visit: Payer: Self-pay | Admitting: Rheumatology

## 2022-02-16 NOTE — Telephone Encounter (Signed)
Patient seen in office by Dr. Tamala Julian on 02/15/22.

## 2022-02-17 NOTE — Telephone Encounter (Signed)
Next Visit: 08/11/2022  Last Visit: 02/03/2022  Last Fill: 01/15/2022  Dx: Other insomnia   Current Dose per office note on 02/03/2022: Ambien 5 mg 1 tablet by mouth at bedtime for insomnia.  Okay to refill Ambien?

## 2022-03-13 ENCOUNTER — Other Ambulatory Visit: Payer: Self-pay | Admitting: Allergy and Immunology

## 2022-03-18 ENCOUNTER — Other Ambulatory Visit: Payer: Self-pay | Admitting: Rheumatology

## 2022-03-18 NOTE — Telephone Encounter (Signed)
Next Visit: 08/11/2022   Last Visit: 02/03/2022   Last Fill: 02/17/2022   Dx: Other insomnia    Current Dose per office note on 02/03/2022: Ambien 5 mg 1 tablet by mouth at bedtime for insomnia.   Okay to refill Ambien?

## 2022-04-21 ENCOUNTER — Other Ambulatory Visit: Payer: Self-pay | Admitting: Rheumatology

## 2022-04-21 NOTE — Telephone Encounter (Signed)
Next Visit: 08/11/2022   Last Visit: 02/03/2022   Last Fill: 03/18/2022   Dx: Other insomnia    Current Dose per office note on 02/03/2022: Ambien 5 mg 1 tablet by mouth at bedtime for insomnia.   Okay to refill Ambien?

## 2022-05-07 ENCOUNTER — Other Ambulatory Visit (INDEPENDENT_AMBULATORY_CARE_PROVIDER_SITE_OTHER): Payer: Medicare Other

## 2022-05-07 ENCOUNTER — Encounter: Payer: Self-pay | Admitting: Neurology

## 2022-05-07 ENCOUNTER — Ambulatory Visit: Payer: Medicare Other | Admitting: Neurology

## 2022-05-07 VITALS — BP 150/81 | HR 93 | Ht 64.0 in | Wt 159.0 lb

## 2022-05-07 DIAGNOSIS — R208 Other disturbances of skin sensation: Secondary | ICD-10-CM | POA: Diagnosis not present

## 2022-05-07 DIAGNOSIS — I10 Essential (primary) hypertension: Secondary | ICD-10-CM

## 2022-05-07 DIAGNOSIS — M542 Cervicalgia: Secondary | ICD-10-CM

## 2022-05-07 DIAGNOSIS — G43009 Migraine without aura, not intractable, without status migrainosus: Secondary | ICD-10-CM

## 2022-05-07 LAB — VITAMIN B12: Vitamin B-12: 91 pg/mL — ABNORMAL LOW (ref 211–911)

## 2022-05-07 LAB — TSH: TSH: 3.3 u[IU]/mL (ref 0.35–5.50)

## 2022-05-07 MED ORDER — ZOLMITRIPTAN 5 MG PO TBDP: 5.0000 mg | ORAL_TABLET | ORAL | 11 refills | Status: DC | PRN

## 2022-05-07 NOTE — Patient Instructions (Addendum)
Check labs  Start home neck stretches  Return to clinic in 1 year

## 2022-05-07 NOTE — Progress Notes (Signed)
Follow-up Visit   Date: 05/07/22   Christina Deleon MRN: [id number] DOB: 1950-06-22   Interim History: Christina Deleon is a 72 y.o. right-handed female with GERD, hypertension, hyperlipidemia, and fibromyalgia returning to the clinic for follow-up of chronic migraine and neck pain.  The patient was accompanied to the clinic by self.  Over the past several months, she has been having worsening generalized pain, episodic tingling in various parts of the body.  She has intermittent numbness in the toes which occurs when walking.  She has neck pain, back pain, and limb pain.  Migraines occur about twice per month, which is responsive to zomig.  She has episodic dull headaches periodically throughout the month.  She is unsure whether symptoms are related to her arthritis, fibromyalgia, or primary neurological disorder.    Medications:  Current Outpatient Medications on File Prior to Visit  Medication Sig Dispense Refill   albuterol (VENTOLIN HFA) 108 (90 Base) MCG/ACT inhaler Inhale 2 puffs into the lungs every 6 (six) hours as needed for wheezing or shortness of breath. 18 g 2   ALPRAZolam (XANAX) 0.5 MG tablet as needed.     Ascorbic Acid (VITAMIN C PO) Take 1 tablet by mouth daily.     Azelastine-Fluticasone 137-50 MCG/ACT SUSP SHAKE LIQUID AND USE ONE SPRAY IN EACH NOSTRIL ONE TO TWO TIMES DAILY. 23 g 5   CALCIUM PO Take 1 tablet by mouth daily.     candesartan (ATACAND) 16 MG tablet Take 8 mg by mouth at bedtime.     Carbinoxamine Maleate 4 MG TABS Can take one to two tablets up to three times daily if needed. 180 tablet 5   cetirizine (ZYRTEC) 10 MG tablet Take 1 tablet (10 mg total) by mouth daily. One tab daily for allergies 30 tablet 1   Cholecalciferol (VITAMIN D) 2000 units tablet Take 2,000 Units by mouth daily.     clotrimazole (MYCELEX) 10 MG troche      diclofenac (CATAFLAM) 50 MG tablet Take 1 tablet (50 mg total) by mouth 2 (two) times daily. 180 tablet 4   diclofenac Sodium  (VOLTAREN) 1 % GEL Apply 2 g topically 4 (four) times daily. 350 g 3   famotidine (PEPCID) 40 MG tablet Take 1 tablet (40 mg total) by mouth at bedtime. 90 tablet 1   Fluticasone-Salmeterol 113-14 MCG/ACT AEPB Inhale two puffs one to two times per day to prevent cough or wheeze.  Rinse, gargle, and spit after use. 1 each 5   Lifitegrast 5 % SOLN Apply 1 drop to eye 2 (two) times daily.     methocarbamol (ROBAXIN) 500 MG tablet TAKE 1 TABLET BY MOUTH DAILY AS NEEDED FOR MUSCLE SPASMS 30 tablet 2   montelukast (SINGULAIR) 10 MG tablet TAKE 1 TABLET(10 MG) BY MOUTH DAILY 90 tablet 1   pantoprazole (PROTONIX) 40 MG tablet TAKE 1 TABLET(40 MG) BY MOUTH TWICE DAILY 180 tablet 1   rosuvastatin (CRESTOR) 20 MG tablet TAKE 1 TABLET(20 MG) BY MOUTH DAILY 90 tablet 3   VITAMIN A PO Take by mouth daily.     zolmitriptan (ZOMIG-ZMT) 5 MG disintegrating tablet Take 1 tablet (5 mg total) by mouth as needed for migraine. 10 tablet 11   zolpidem (AMBIEN) 5 MG tablet TAKE 1 TABLET(5 MG) BY MOUTH AT BEDTIME AS NEEDED FOR SLEEP 30 tablet 0   No current facility-administered medications on file prior to visit.    Allergies:  Allergies  Allergen Reactions   Trazodone Hcl  Other (See Comments)    REACTION: bad headaches   Clindamycin/Lincomycin Other (See Comments)    headaches   Penicillins Nausea Only and Rash    Patient has taken Amoxicillin without an issue, per patient. Has patient had a PCN reaction causing immediate rash, facial/tongue/throat swelling, SOB or lightheadedness with hypotension: No Has patient had a PCN reaction causing severe rash involving mucus membranes or skin necrosis: No Has patient had a PCN reaction that required hospitalization No Has patient had a PCN reaction occurring within the last 10 years: No If all of the above answers are "NO", then may proceed with Cephalosporin use.     Vital Signs:  BP (!) 150/81   Pulse 93   Ht '5\' 4"'$  (1.626 m)   Wt 159 lb (72.1 kg)   LMP  07/02/1994   SpO2 99%   BMI 27.29 kg/m   Neurological Exam: MENTAL STATUS including orientation to time, place, person, recent and remote memory, attention span and concentration, language, and fund of knowledge is normal.  Speech is not dysarthric.  CRANIAL NERVES:   Pupils round and reactive bilaterally. No ptosis. Extraocular muscles intact.   MOTOR:  Motor strength is 5/5 in all extremities.  No atrophy, fasciculations or abnormal movements.    MSRs:  Reflexes are 2+/4 throughout  SENSATION:  Vibration intact throughout.  COORDINATION/GAIT:   Gait narrow based and stable.   Data:  MRI/A brain 10/01/2020:   Unremarkable MRI and MR angiogram of the brain.  IMPRESSION/PLAN: Cervicalgia  - PT declined - Start home neck stretching exercises   2.   Episodic migriane, triggered by stress.  MRI/A brain is normal  - Continue zomig 2.5-'5mg'$  as needed limit to twice per week  3.  Dysesthesia most likely related to fibromyalgia.  Symptoms do not conform to a nerve distribution - Check vitamin B12, and TSH  Return to clinic in 1 year  Thank you for allowing me to participate in patient's care.  If I can answer any additional questions, I would be pleased to do so.    Sincerely,    Refoel Palladino K. Posey Pronto, DO

## 2022-05-17 ENCOUNTER — Telehealth: Payer: Self-pay | Admitting: Anesthesiology

## 2022-05-17 NOTE — Telephone Encounter (Signed)
See results note. 

## 2022-05-17 NOTE — Telephone Encounter (Signed)
Pt called stating she would like to get her lab results.

## 2022-05-20 ENCOUNTER — Other Ambulatory Visit: Payer: Self-pay | Admitting: Rheumatology

## 2022-05-21 NOTE — Telephone Encounter (Addendum)
Patient is returning a call to Jadyn, Brasher wanted me to inform Mahina that her PCP is Dr.Husain phone number is  [phone]. Would like a call back as well.

## 2022-05-21 NOTE — Telephone Encounter (Signed)
Called Patients PCP and left a message with Dr. Marchia Bond Nurse to give me a call back. Left detailed message that patient is needing B12 injections and would like to have her PCP office do this for her. Pending call back.

## 2022-05-24 ENCOUNTER — Telehealth: Payer: Self-pay | Admitting: Neurology

## 2022-05-24 NOTE — Telephone Encounter (Signed)
Labs and note about patient needing B12 injections sent to patients PCP

## 2022-05-24 NOTE — Telephone Encounter (Signed)
Received call from Cassie to have labs sent to Dr. Lysle Rubens so he may review. Patient has a f/u with him in Mach 2024 as well. Labs have been faxed and I have called Cassie and left a message on her voicemail informing her the labs have been faxed over. Informed Cassie that she is has not received them to let me know and I will refax.

## 2022-05-24 NOTE — Telephone Encounter (Signed)
Patient would like to speak to [facility] abut her B-12 injection

## 2022-05-24 NOTE — Telephone Encounter (Signed)
Called Christina Deleon and informed her that I have spoke to her PCP office and labs have been faxedover for their reviewed. Patient will give Dr. Lysle Rubens some time to review her labs that I have sent over and will give her PCP a call to get scheduled for injections. Patient is aware to give me a call if she has any issues.

## 2022-05-25 ENCOUNTER — Telehealth: Payer: Self-pay | Admitting: Neurology

## 2022-05-25 NOTE — Telephone Encounter (Signed)
Pt called in stating she would like to come in today for a B12 shot? Her PCP is trying to give her pills, but she would prefer to go with Dr. Serita Grit recommendations of an injection.

## 2022-05-25 NOTE — Telephone Encounter (Signed)
Called patient and left a detailed message that she may call our office to schedule her B 12 injections. Left our contact information to schedule her B12 injections and if she had any questions or concerns.

## 2022-05-28 ENCOUNTER — Other Ambulatory Visit: Payer: Self-pay | Admitting: Sports Medicine

## 2022-05-28 ENCOUNTER — Ambulatory Visit
Admission: RE | Admit: 2022-05-28 | Discharge: 2022-05-28 | Disposition: A | Discharge status: Home / Self Care | Payer: Medicare Other | Source: Ambulatory Visit | Attending: Sports Medicine | Admitting: Sports Medicine

## 2022-05-28 DIAGNOSIS — M25561 Pain in right knee: Secondary | ICD-10-CM

## 2022-06-18 ENCOUNTER — Other Ambulatory Visit: Payer: Self-pay | Admitting: Rheumatology

## 2022-06-18 NOTE — Telephone Encounter (Signed)
Next Visit: 08/11/2022  Last Visit: 02/03/2022  Last Fill:05/20/2022  Dx: insomnia   Current Dose per office note on 02/03/2022: Ambien 5 mg 1 tablet at bedtime as needed for insomnia.   Okay to refill ambien?

## 2022-07-01 ENCOUNTER — Encounter: Payer: Self-pay | Admitting: Gastroenterology

## 2022-07-01 ENCOUNTER — Ambulatory Visit: Payer: Medicare Other | Admitting: Gastroenterology

## 2022-07-01 VITALS — BP 138/76 | HR 77 | Ht 64.0 in | Wt 160.0 lb

## 2022-07-01 DIAGNOSIS — K219 Gastro-esophageal reflux disease without esophagitis: Secondary | ICD-10-CM | POA: Diagnosis not present

## 2022-07-01 DIAGNOSIS — R194 Change in bowel habit: Secondary | ICD-10-CM

## 2022-07-01 DIAGNOSIS — R1310 Dysphagia, unspecified: Secondary | ICD-10-CM

## 2022-07-01 MED ORDER — DICYCLOMINE HCL 10 MG PO CAPS: 10.0000 mg | ORAL_CAPSULE | Freq: Three times a day (TID) | ORAL | 11 refills | Status: DC | PRN

## 2022-07-01 NOTE — Progress Notes (Signed)
    Assessment     GERD, globus sensation, dysphagia - R/O stricture, esophagitis Change in bowel habits, loose stools - R/O dietary intolerance   Recommendations    Schedule barium esophagram and EGD. The risks (including bleeding, perforation, infection, missed lesions, medication reactions and possible hospitalization or surgery if complications occur), benefits, and alternatives to endoscopy with possible biopsy and possible dilation were discussed with the patient and they consent to proceed.   Continue pantoprazole 40 mg p.o. twice daily, famotidine 40 mg at bedtime and follow antireflux measures Lactose-free diet for 1 week if not effective then avoid raw fruits and raw vegetables for 1 week Dicyclomine 10 mg p.o. 3 times daily as needed   HPI    This is a 72 year old female with a history of GERD, globus sensation, irritable larynx, upper airway cough syndrome who relates intermittent difficulty swallowing solids and persistent globus sensation.  She also notes a change in bowel habits with more frequent stools over the past few months.  She notes increased gurgling sounds in her abdomen which are sometimes socially embarrassing.   Labs / Imaging       Latest Ref Rng & Units 12/08/2020    8:55 AM 11/12/2020    9:56 AM 08/05/2020   10:00 AM  Hepatic Function  Total Protein 6.0 - 8.5 g/dL 6.5  6.4  6.4   Albumin 3.8 - 4.8 g/dL 3.8     AST 0 - 40 IU/L 20  16  18   $ ALT 0 - 32 IU/L 13  12  17   $ Alk Phosphatase 44 - 121 IU/L 77     Total Bilirubin 0.0 - 1.2 mg/dL <0.2  0.3  0.4        Latest Ref Rng & Units 12/08/2020    8:55 AM 08/05/2020   10:00 AM 12/30/2019    2:59 PM  CBC  WBC 3.4 - 10.8 x10E3/uL 4.1  4.5  6.2   Hemoglobin 11.1 - 15.9 g/dL 12.5  13.2  13.5   Hematocrit 34.0 - 46.6 % 37.2  40.1  41.8   Platelets 150 - 450 x10E3/uL 262  344  241     Current Medications, Allergies, Past Medical History, Past Surgical History, Family History and Social History were reviewed  in [employer] record.   Physical Exam: General: Well developed, well nourished, no acute distress Head: Normocephalic and atraumatic Eyes: Sclerae anicteric, EOMI Ears: Normal auditory acuity Mouth: No deformities or lesions noted Lungs: Clear throughout to auscultation Heart: Regular rate and rhythm; No murmurs, rubs or bruits Abdomen: Soft, non tender and non distended. No masses, hepatosplenomegaly or hernias noted. Normal Bowel sounds Rectal: Not done Musculoskeletal: Symmetrical with no gross deformities  Pulses:  Normal pulses noted Extremities: No edema or deformities noted Neurological: Alert oriented x 4, grossly nonfocal Psychological:  Alert and cooperative. Normal mood and affect   Rees Matura T. Fuller Plan, MD 07/01/2022, 10:24 AM

## 2022-07-01 NOTE — Patient Instructions (Addendum)
We have sent the following medications to your pharmacy for you to pick up at your convenience: dicyclomine.   You have been scheduled for an endoscopy. Please follow written instructions given to you at your visit today. If you use inhalers (even only as needed), please bring them with you on the day of your procedure.  Please start a lactose free diet x 7 days. If this does not help then stop. Then try a raw fruits and vegetable diet x 7 days.   You have been scheduled for a Barium Esophogram at [facility] (1st floor of the hospital) on 07/06/22 at 11:00am. Please arrive 30 minutes prior to your appointment for registration. Make certain not to have anything to eat or drink 3 hours prior to your test. If you need to reschedule for any reason, please contact radiology at [phone] to do so. __________________________________________________________________ A barium swallow is an examination that concentrates on views of the esophagus. This tends to be a double contrast exam (barium and two liquids which, when combined, create a gas to distend the wall of the oesophagus) or single contrast (non-ionic iodine based). The study is usually tailored to your symptoms so a good history is essential. Attention is paid during the study to the form, structure and configuration of the esophagus, looking for functional disorders (such as aspiration, dysphagia, achalasia, motility and reflux) EXAMINATION You may be asked to change into a gown, depending on the type of swallow being performed. A radiologist and radiographer will perform the procedure. The radiologist will advise you of the type of contrast selected for your procedure and direct you during the exam. You will be asked to stand, sit or lie in several different positions and to hold a small amount of fluid in your mouth before being asked to swallow while the imaging is performed .In some instances you may be asked to swallow barium coated  marshmallows to assess the motility of a solid food bolus. The exam can be recorded as a digital or video fluoroscopy procedure. POST PROCEDURE It will take 1-2 days for the barium to pass through your system. To facilitate this, it is important, unless otherwise directed, to increase your fluids for the next 24-48hrs and to resume your normal diet.  This test typically takes about 30 minutes to perform. _______________________________________________________________________  The [facility] providers would like to encourage you to use [facility] to communicate with providers for non-urgent requests or questions.  Due to long hold times on the telephone, sending your provider a message by [facility] may be a faster and more efficient way to get a response.  Please allow 48 business hours for a response.  Please remember that this is for non-urgent requests.   Due to recent changes in healthcare laws, you may see the results of your imaging and laboratory studies on MyChart before your provider has had a chance to review them.  We understand that in some cases there may be results that are confusing or concerning to you. Not all laboratory results come back in the same time frame and the provider may be waiting for multiple results in order to interpret others.  Please give [place] 48 hours in order for your provider to thoroughly review all the results before contacting the office for clarification of your results.   Thank you for choosing me and [facility].  Pricilla Riffle. Dagoberto Ligas., MD., Marval Regal

## 2022-07-02 HISTORY — PX: UPPER GI ENDOSCOPY: SHX6162

## 2022-07-06 ENCOUNTER — Ambulatory Visit (HOSPITAL_COMMUNITY)
Admission: RE | Admit: 2022-07-06 | Discharge: 2022-07-06 | Disposition: A | Discharge status: Home / Self Care | Payer: Medicare Other | Source: Ambulatory Visit | Attending: Gastroenterology | Admitting: Gastroenterology

## 2022-07-06 DIAGNOSIS — K219 Gastro-esophageal reflux disease without esophagitis: Secondary | ICD-10-CM | POA: Insufficient documentation

## 2022-07-06 DIAGNOSIS — R1310 Dysphagia, unspecified: Secondary | ICD-10-CM | POA: Insufficient documentation

## 2022-07-14 ENCOUNTER — Ambulatory Visit (AMBULATORY_SURGERY_CENTER): Payer: Medicare Other | Admitting: Gastroenterology

## 2022-07-14 ENCOUNTER — Encounter: Payer: Self-pay | Admitting: Gastroenterology

## 2022-07-14 VITALS — BP 139/69 | HR 83 | Temp 97.8°F | Resp 19 | Ht 64.0 in | Wt 160.0 lb

## 2022-07-14 DIAGNOSIS — K219 Gastro-esophageal reflux disease without esophagitis: Secondary | ICD-10-CM

## 2022-07-14 DIAGNOSIS — R09A2 Foreign body sensation, throat: Secondary | ICD-10-CM

## 2022-07-14 DIAGNOSIS — R1310 Dysphagia, unspecified: Secondary | ICD-10-CM | POA: Diagnosis not present

## 2022-07-14 DIAGNOSIS — K2950 Unspecified chronic gastritis without bleeding: Secondary | ICD-10-CM | POA: Diagnosis not present

## 2022-07-14 MED ORDER — SODIUM CHLORIDE 0.9 % IV SOLN: 500.0000 mL | Freq: Once | INTRAVENOUS | Status: DC

## 2022-07-14 NOTE — Progress Notes (Signed)
Called to room to assist during endoscopic procedure.  Patient ID and intended procedure confirmed with present staff. Received instructions for my participation in the procedure from the performing physician.  

## 2022-07-14 NOTE — Progress Notes (Signed)
See 07/01/2022 H&P, no changes

## 2022-07-14 NOTE — Patient Instructions (Addendum)
-  Handout on Hiatal Hernia, anti reflux measure and post dilation diet provided -await pathology results -repeat colonoscopy for surveillance recommended. Date to be determined when pathology result become available   -Continue present medications  -Follow anti reflux measure long term   YOU HAD AN ENDOSCOPIC PROCEDURE TODAY AT [facility]:   Refer to the procedure report that was given to you for any specific questions about what was found during the examination.  If the procedure report does not answer your questions, please call your gastroenterologist to clarify.  If you requested that your care partner not be given the details of your procedure findings, then the procedure report has been included in a sealed envelope for you to review at your convenience later.  YOU SHOULD EXPECT: Some feelings of bloating in the abdomen. Passage of more gas than usual.  Walking can help get rid of the air that was put into your GI tract during the procedure and reduce the bloating. If you had a lower endoscopy (such as a colonoscopy or flexible sigmoidoscopy) you may notice spotting of blood in your stool or on the toilet paper. If you underwent a bowel prep for your procedure, you may not have a normal bowel movement for a few days.  Please Note:  You might notice some irritation and congestion in your nose or some drainage.  This is from the oxygen used during your procedure.  There is no need for concern and it should clear up in a day or so.  SYMPTOMS TO REPORT IMMEDIATELY:   Following upper endoscopy (EGD)  Vomiting of blood or coffee ground material  New chest pain or pain under the shoulder blades  Painful or persistently difficult swallowing  New shortness of breath  Fever of 100F or higher  Black, tarry-looking stools  For urgent or emergent issues, a gastroenterologist can be reached at any hour by calling [phone]. Do not use MyChart messaging for urgent concerns.     DIET:  Clear liquid diet for two hour, until 12:05 pm . Soft diet starting at 12:05 pm until tomorrow. See post dilation diet handout for recommendations.Drink plenty of fluids but you should avoid alcoholic beverages for 24 hours   ACTIVITY:  You should plan to take it easy for the rest of today and you should NOT DRIVE or use heavy machinery until tomorrow (because of the sedation medicines used during the test).    FOLLOW UP: Our staff will call the number listed on your records the next business day following your procedure.  We will call around 7:15- 8:00 am to check on you and address any questions or concerns that you may have regarding the information given to you following your procedure. If we do not reach you, we will leave a message.     If any biopsies were taken you will be contacted by phone or by letter within the next 1-3 weeks.  Please call us at [phone] if you have not heard about the biopsies in 3 weeks.    SIGNATURES/CONFIDENTIALITY: You and/or your care partner have signed paperwork which will be entered into your electronic medical record.  These signatures attest to the fact that that the information above on your After Visit Summary has been reviewed and is understood.  Full responsibility of the confidentiality of this discharge information lies with you and/or your care-partner.

## 2022-07-14 NOTE — Progress Notes (Signed)
VS by DT  Pt's states no medical or surgical changes since previsit or office visit.  

## 2022-07-14 NOTE — Op Note (Signed)
[facility] Patient Name: Christina Deleon Procedure Date: 07/14/2022 9:40 AM MRN: [id number] Endoscopist: Ladene Artist , MD, [id number] Age: 73 Referring MD:  Date of Birth: 10-04-1950 Gender: Female Account #: [id number] Procedure:                Upper GI endoscopy Indications:              Dysphagia, Gastroesophageal reflux disease, Globus                            sensation Medicines:                Monitored Anesthesia Care Procedure:                Pre-Anesthesia Assessment:                           - Prior to the procedure, a History and Physical                            was performed, and patient medications and                            allergies were reviewed. The patient's tolerance of                            previous anesthesia was also reviewed. The risks                            and benefits of the procedure and the sedation                            options and risks were discussed with the patient.                            All questions were answered, and informed consent                            was obtained. Prior Anticoagulants: The patient has                            taken no anticoagulant or antiplatelet agents. ASA                            Grade Assessment: II - A patient with mild systemic                            disease. After reviewing the risks and benefits,                            the patient was deemed in satisfactory condition to                            undergo the procedure.  After obtaining informed consent, the endoscope was                            passed under direct vision. Throughout the                            procedure, the patient's blood pressure, pulse, and                            oxygen saturations were monitored continuously. The                            GIF [id number] was introduced through the                            mouth, and advanced to the second part of  duodenum.                            The upper GI endoscopy was accomplished without                            difficulty. The patient tolerated the procedure                            well. Scope In: Scope Out: Findings:                 One benign-appearing, intrinsic mild stenosis was                            found at the gastroesophageal junction. This                            stenosis measured 1.4 cm (inner diameter) x less                            than one cm (in length). The stenosis was                            traversed. A guidewire was placed and the scope was                            withdrawn. Dilation was performed with a Savary                            dilator with mild resistance at 17 mm.                           The exam of the esophagus was otherwise normal.                            Biopsies were taken with a cold forceps for  histology.                           A small hiatal hernia was present.                           Diffuse atrophic mucosa was found in the gastric                            fundus and in the gastric body. Biopsies were taken                            with a cold forceps for histology.                           The exam of the stomach was otherwise normal.                           The duodenal bulb and second portion of the                            duodenum were normal. Complications:            No immediate complications. Estimated Blood Loss:     Estimated blood loss was minimal. Impression:               - Benign-appearing esophageal stenosis. Dilated.                           - Otherwise normal appearing esophagus. Biopsied.                           - Small hiatal hernia.                           - Gastric mucosal atrophy. Biopsied.                           - Normal duodenal bulb and second portion of the                            duodenum. Recommendation:           - Patient has a contact  number available for                            emergencies. The signs and symptoms of potential                            delayed complications were discussed with the                            patient. Return to normal activities tomorrow.                            Written discharge instructions were provided to the  patient.                           - Clear liquid diet for 2 hours, then advance as                            tolerated to soft diet today.                           - Resume prior diet tomorrow.                           - Follow antireflux measures long term.                           - Continue present medications.                           - Await pathology results. Ladene Artist, MD 07/14/2022 10:03:29 AM This report has been signed electronically.

## 2022-07-15 ENCOUNTER — Telehealth: Payer: Self-pay | Admitting: *Deleted

## 2022-07-15 NOTE — Telephone Encounter (Signed)
  Follow up Call-     07/14/2022    9:19 AM 09/17/2020    7:21 AM  Call back number  Post procedure Call Back phone  # [phone]  Permission to leave phone message Yes Yes     Patient questions:  Do you have a fever, pain , or abdominal swelling? No. Pain Score  0 *  Have you tolerated food without any problems? Yes.    Have you been able to return to your normal activities? Yes.    Do you have any questions about your discharge instructions: Diet   No. Medications  No. Follow up visit  No.  Do you have questions or concerns about your Care? No.  Actions: * If pain score is 4 or above: No action needed, pain <4.

## 2022-07-19 ENCOUNTER — Other Ambulatory Visit: Payer: Self-pay | Admitting: Physician Assistant

## 2022-07-19 NOTE — Telephone Encounter (Signed)
Next Visit: 08/11/2022   Last Visit: 02/03/2022   Last Fill: 06/18/2022   Dx: insomnia    Current Dose per office note on 02/03/2022: Ambien 5 mg 1 tablet at bedtime as needed for insomnia.    Okay to refill ambien?

## 2022-07-21 ENCOUNTER — Telehealth: Payer: Self-pay

## 2022-07-21 ENCOUNTER — Ambulatory Visit: Payer: Medicare Other | Admitting: Allergy and Immunology

## 2022-07-21 ENCOUNTER — Encounter: Payer: Self-pay | Admitting: Allergy and Immunology

## 2022-07-21 VITALS — BP 142/72 | HR 80 | Resp 16 | Ht 63.5 in | Wt 159.4 lb

## 2022-07-21 DIAGNOSIS — J3089 Other allergic rhinitis: Secondary | ICD-10-CM | POA: Diagnosis not present

## 2022-07-21 DIAGNOSIS — J4540 Moderate persistent asthma, uncomplicated: Secondary | ICD-10-CM | POA: Diagnosis not present

## 2022-07-21 DIAGNOSIS — K219 Gastro-esophageal reflux disease without esophagitis: Secondary | ICD-10-CM | POA: Diagnosis not present

## 2022-07-21 DIAGNOSIS — K2940 Chronic atrophic gastritis without bleeding: Secondary | ICD-10-CM | POA: Diagnosis not present

## 2022-07-21 NOTE — Telephone Encounter (Signed)
Faxed order for nebulizer and duoneb to [facility] Patient.

## 2022-07-21 NOTE — Progress Notes (Unsigned)
[facility]   Follow-up Note  Referring Provider: Wenda Low, MD Primary Provider: Wenda Low, MD Date of Office Visit: 07/21/2022  Subjective:   Christina Deleon (DOB: 1950/10/25) is a 73 y.o. female who returns to the [facility] on 07/21/2022 in re-evaluation of the following:  HPI: Christina Deleon returns to this clinic in evaluation of asthma and allergic rhinitis and LPR and recurrent thrush.  I last saw her in this clinic 21 January 2020.  Overall she is continue to have her chronic cough but it is very manageable.  She did have an upper endoscopy performed recently which has given rise to an exacerbation of her cough and throat clearing and irritation in her throat but overall she thought that all of her throat issues and all of her coughing were stable problem while using anti-inflammatory agents for her airway and therapy directed against LPR.  She will sometimes wake up at nighttime and she will drink tea with honey which seems to help with her cough and sometimes she will use a short acting bronchodilator which may sometimes help with her cough.  She is requesting a nebulizer to use on an as-needed basis.  Apparently she did develop some kind of gastrointestinal issue that lasted about 1 week as did her husband around Christmas time but fortunately that issue is completely resolved.  Her recurrent thrush is under very good control with her clotrimazole.  Allergies as of 07/21/2022       Reactions   Trazodone Hcl Other (See Comments)   REACTION: bad headaches   Clindamycin/lincomycin Other (See Comments)   headaches   Penicillins Nausea Only, Rash   Patient has taken Amoxicillin without an issue, per patient. Has patient had a PCN reaction causing immediate rash, facial/tongue/throat swelling, SOB or lightheadedness with hypotension: No Has patient had a PCN reaction causing severe rash involving mucus membranes or  skin necrosis: No Has patient had a PCN reaction that required hospitalization No Has patient had a PCN reaction occurring within the last 10 years: No If all of the above answers are "NO", then may proceed with Cephalosporin use.        Medication List    Advair HFA 115-21 MCG/ACT inhaler Generic drug: fluticasone-salmeterol Inhale 2 puffs into the lungs 2 (two) times daily.   albuterol 108 (90 Base) MCG/ACT inhaler Commonly known as: VENTOLIN HFA Inhale 2 puffs into the lungs every 6 (six) hours as needed for wheezing or shortness of breath.   ALPRAZolam 0.5 MG tablet Commonly known as: XANAX as needed.   Azelastine-Fluticasone 137-50 MCG/ACT Susp SHAKE LIQUID AND USE ONE SPRAY IN EACH NOSTRIL ONE TO TWO TIMES DAILY.   B-12 COMPLIANCE INJECTION IJ Inject as directed. Once monthly   CALCIUM PO Take 1 tablet by mouth daily.   candesartan 16 MG tablet Commonly known as: ATACAND Take 8 mg by mouth at bedtime.   Carbinoxamine Maleate 4 MG Tabs Can take one to two tablets up to three times daily if needed.   cetirizine 10 MG tablet Commonly known as: ZYRTEC Take 1 tablet (10 mg total) by mouth daily. One tab daily for allergies   clotrimazole 10 MG troche Commonly known as: MYCELEX   diclofenac 50 MG tablet Commonly known as: CATAFLAM Take 1 tablet (50 mg total) by mouth 2 (two) times daily.   diclofenac Sodium 1 % Gel Commonly known as: VOLTAREN Apply 2 g topically 4 (four) times daily.  dicyclomine 10 MG capsule Commonly known as: BENTYL Take 1 capsule (10 mg total) by mouth 3 (three) times daily as needed for spasms.   famotidine 40 MG tablet Commonly known as: Pepcid Take 1 tablet (40 mg total) by mouth at bedtime.   Lifitegrast 5 % Soln Apply 1 drop to eye 2 (two) times daily.   methocarbamol 500 MG tablet Commonly known as: ROBAXIN TAKE 1 TABLET BY MOUTH DAILY AS NEEDED FOR MUSCLE SPASMS   montelukast 10 MG tablet Commonly known as:  SINGULAIR TAKE 1 TABLET(10 MG) BY MOUTH DAILY   Multi-Vitamin tablet Take by mouth.   pantoprazole 40 MG tablet Commonly known as: PROTONIX TAKE 1 TABLET(40 MG) BY MOUTH TWICE DAILY   rosuvastatin 20 MG tablet Commonly known as: CRESTOR TAKE 1 TABLET(20 MG) BY MOUTH DAILY   VITAMIN A PO Take by mouth daily.   VITAMIN C PO Take 1 tablet by mouth daily.   Vitamin D 50 MCG (2000 UT) tablet Take 2,000 Units by mouth daily.   zolmitriptan 5 MG disintegrating tablet Commonly known as: ZOMIG-ZMT Take 1 tablet (5 mg total) by mouth as needed for migraine.   zolpidem 5 MG tablet Commonly known as: AMBIEN TAKE 1 TABLET(5 MG) BY MOUTH AT BEDTIME AS NEEDED FOR SLEEP    Past Medical History:  Diagnosis Date   Adenomatous colon polyp 10/1997, and 03/2009   Allergic rhinitis    Allergy    Aortic atherosclerosis (HCC)    Arthritis    osteo   Asthma    B12 deficiency    Chronic gastritis    Coronary artery calcification seen on CT scan    Fibromyalgia    GERD (gastroesophageal reflux disease)    Hemorrhoids    Hyperlipidemia    Hypertension    Menorrhagia    partial hysterectomy   Migraines    Osteoarthritis    PONV (postoperative nausea and vomiting)    Premature atrial contractions    Sleep disturbance    STD (sexually transmitted disease) 01/22/11 culture proven   HSV type I labia    Past Surgical History:  Procedure Laterality Date   CATARACT EXTRACTION Bilateral 10/16 & 11/16   EXCISION METACARPAL MASS Right 11/16/2016   Procedure: RIGHT LITTLE FINGER CYST EXCISION;  Surgeon: Milly Jakob, MD;  Location: [facility];  Service: Orthopedics;  Laterality: Right;   TONSILLECTOMY  age 78   tooth extract     with bone graft   Turbinate sinus surgery  2003   [place]   secondary to prolapse, ovaries remain    Review of systems negative except as noted in HPI / PMHx or noted below:  Review of Systems  Constitutional: Negative.    HENT: Negative.    Eyes: Negative.   Respiratory: Negative.    Cardiovascular: Negative.   Gastrointestinal: Negative.   Genitourinary: Negative.   Musculoskeletal: Negative.   Skin: Negative.   Neurological: Negative.   Endo/Heme/Allergies: Negative.   Psychiatric/Behavioral: Negative.       Objective:   Vitals:   07/21/22 1053  BP: (!) 142/72  Pulse: 80  Resp: 16  SpO2: 98%   Height: 5' 3.5" (161.3 cm)  Weight: 159 lb 6.4 oz (72.3 kg)   Physical Exam Constitutional:      Appearance: She is not diaphoretic.  HENT:     Head: Normocephalic.     Right Ear: Tympanic membrane, ear canal and external ear normal.     Left Ear: Tympanic  membrane, ear canal and external ear normal.     Nose: Nose normal. No mucosal edema or rhinorrhea.     Mouth/Throat:     Pharynx: Uvula midline. No oropharyngeal exudate.  Eyes:     Conjunctiva/sclera: Conjunctivae normal.  Neck:     Thyroid: No thyromegaly.     Trachea: Trachea normal. No tracheal tenderness or tracheal deviation.  Cardiovascular:     Rate and Rhythm: Normal rate and regular rhythm.     Heart sounds: Normal heart sounds, S1 normal and S2 normal. No murmur heard. Pulmonary:     Effort: No respiratory distress.     Breath sounds: Normal breath sounds. No stridor. No wheezing or rales.  Lymphadenopathy:     Head:     Right side of head: No tonsillar adenopathy.     Left side of head: No tonsillar adenopathy.     Cervical: No cervical adenopathy.  Skin:    Findings: No erythema or rash.     Nails: There is no clubbing.  Neurological:     Mental Status: She is alert.     Diagnostics:    Spirometry was performed and demonstrated an FEV1 of 1.62 at 90 % of predicted.  Results of the upper endoscopy biopsy obtained 14 July 2022 identified the following:  1. Surgical [P], gastric antrum and gastric body - CHRONIC INACTIVE GASTRITIS, WITH ATROPHY, INTESTINAL METAPLASIA, AND NEUROENDOCRINE CELL HYPERPLASIA (SEE  NOTE) - NEGATIVE FOR H.PYLORI ON IMMUNOHISTOCHEMICAL STAIN - NEGATIVE FOR DYSPLASIA OR MALIGNANCY 2. Surgical [P], esophagus - BENIGN SQUAMOUS MUCOSA WITH MILD REFLUX CHANGES - NEGATIVE FOR INTESTINAL METAPLASIA, DYSPLASIA OR MALIGNANCY  Assessment and Plan:   1. Not well controlled moderate persistent asthma   2. Other allergic rhinitis   3. LPRD (laryngopharyngeal reflux disease)   4. Atrophic gastritis without hemorrhage    1. Continue to treat reflux/LPR:    A. Pantoprazole 40mg  2 times a day  B. Famotidine 40 mg in evening   C. Replace throat clearing with swallowing/drinking maneuver  D. Check with Dr. Fuller Plan about atrophic gastritis  2. Continue to treat and prevent inflammation:   A. Dymista -1 spray each nostril 1-2 times per day  B. montelukast 10mg  -1 time per day  C. Advair 115 HFA - 2 inhalations 2 times per day (empty lungs)  3. If needed:   A. ProAir HFA  B. Zyrtec 10 mg daily or Carbinoxamine 4 mg - 1-2 tablets up to 3 times per day  C. Clotrimazole troches - 1 time per day  D. nasal saline E. Nebulizer - Duoneb- every 4-6 hours    4. Return to clinic in 6 months or earlier if problem   Lalania appears to be okay although certainly has a chronic cough and chronic laryngeal issues even in the face of utilizing therapy directed against respiratory tract inflammation and LPR.  And now she has atrophic gastritis.  I have asked her to discuss with Dr. Fuller Plan about further management of this issue but she may need to come off her proton pump inhibitors which will probably end up making her throat a lot worse which is an issue that may need to be addressed further in the future.  I will see her back in this clinic in 6 months or earlier if there is a problem.  Allena Katz, MD Allergy / Immunology [facility]

## 2022-07-21 NOTE — Patient Instructions (Addendum)
  1. Continue to treat reflux/LPR:    A. Pantoprazole 40mg  2 times a day  B. Famotidine 40 mg in evening   C. Replace throat clearing with swallowing/drinking maneuver  D. Check with Dr. Fuller Plan about atrophic gastritis  2. Continue to treat and prevent inflammation:   A. Dymista -1 spray each nostril 1-2 times per day  B. montelukast 10mg  -1 time per day  C. Advair 115 HFA - 2 inhalations 2 times per day (empty lungs)  3. If needed:   A. ProAir HFA  B. Zyrtec 10 mg daily or Carbinoxamine 4 mg - 1-2 tablets up to 3 times per day  C. Clotrimazole troches - 1 time per day  D. nasal saline E. Nebulizer - Duoneb- every 4-6 hours    4. Return to clinic in 6 months or earlier if problem

## 2022-07-22 ENCOUNTER — Encounter: Payer: Self-pay | Admitting: Allergy and Immunology

## 2022-07-26 ENCOUNTER — Telehealth: Payer: Self-pay | Admitting: Gastroenterology

## 2022-07-26 NOTE — Telephone Encounter (Signed)
Returned call to patient. Informed her that MD was out of office this week. He will review results when he returns and letter will be sent. Patient voiced understanding.

## 2022-07-26 NOTE — Telephone Encounter (Signed)
Patient is calling requesting lab results for 3/13 .PLease advise

## 2022-07-28 ENCOUNTER — Other Ambulatory Visit: Payer: Self-pay | Admitting: *Deleted

## 2022-07-28 MED ORDER — IPRATROPIUM-ALBUTEROL 0.5-2.5 (3) MG/3ML IN SOLN: RESPIRATORY_TRACT | 1 refills | Status: AC

## 2022-07-28 NOTE — Progress Notes (Signed)
Office Visit Note  Patient: Christina Deleon             Date of Birth: 03/04/1951           MRN: [id number]             PCP: Georgann HousekeeperHusain, Karrar, MD Referring: Georgann HousekeeperHusain, Karrar, MD Visit Date: 08/11/2022 Occupation: @GUAROCC @  Subjective:  Pain in multiple joints  History of Present Illness: Christina Deleon is a 72 y.o. female with history of osteoarthritis, degenerative disc disease and fibromyalgia syndrome.  She states she continues to have pain and discomfort in her bilateral hands, neck, lower back and intermittently in the trochanteric region.  She continues to have trapezius muscle spasm and generalized pain from fibromyalgia.  She states she has shooting pain is migratory.  She continues to take methocarbamol 500 mg 1 tablet as needed for muscle spasm.  He takes diclofenac 50 mg occasionally.  For insomnia she has been taking Ambien 5 mg p.o. nightly as needed.  She has not noticed any increased joint swelling.    Activities of Daily Living:  Patient reports morning stiffness for all day. Patient Reports nocturnal pain.  Difficulty dressing/grooming: Denies Difficulty climbing stairs: Reports Difficulty getting out of chair: Denies Difficulty using hands for taps, buttons, cutlery, and/or writing: Reports  Review of Systems  Constitutional:  Positive for fatigue.  HENT:  Negative for mouth sores and mouth dryness.   Eyes:  Positive for dryness.  Respiratory:  Negative for shortness of breath.   Cardiovascular:  Negative for chest pain.  Gastrointestinal:  Positive for diarrhea and heartburn. Negative for blood in stool and constipation.  Endocrine: Negative for increased urination.  Genitourinary:  Negative for involuntary urination.  Musculoskeletal:  Positive for joint pain, gait problem, joint pain, myalgias, morning stiffness, muscle tenderness and myalgias. Negative for joint swelling and muscle weakness.  Skin:  Negative for color change, rash, hair loss and sensitivity to  sunlight.  Allergic/Immunologic: Negative for susceptible to infections.  Neurological:  Positive for dizziness and headaches.  Hematological:  Negative for swollen glands.  Psychiatric/Behavioral:  Positive for sleep disturbance. Negative for depressed mood. The patient is not nervous/anxious.     PMFS History:  Patient Active Problem List   Diagnosis Date Noted   Anxiety 05/21/2021   Dyslipidemia 05/21/2021   Dysphagia 05/21/2021   External hemorrhoids without complication 05/21/2021   Generalized anxiety disorder 05/21/2021   Hardening of the aorta (main artery of the heart) 05/21/2021   History of hysterectomy 05/21/2021   Thyroid nodule 05/21/2021   Kidney stone 05/21/2021   Multinodular goiter 05/21/2021   Osteopenia 05/21/2021   Pure hypercholesterolemia 05/21/2021   Sciatica 05/21/2021   Stress due to family tension 05/21/2021   Vitamin D deficiency 05/21/2021   Cervical disc disease 05/21/2021   Unspecified thoracic, thoracolumbar and lumbosacral intervertebral disc disorder 05/21/2021   Dysosmia 12/18/2019   Cough variant asthma vs uacs  04/13/2018   Fibromyalgia 04/19/2016   Other fatigue 04/19/2016   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   Primary osteoarthritis of both knees 04/19/2016   Leg pain 01/22/2016   Restless legs syndrome 01/22/2016   Snoring 11/19/2015   Periodic limb movement sleep disorder 11/19/2015   GERD (gastroesophageal reflux disease) 10/08/2015   Mild persistent asthma 04/09/2015   LPRD (laryngopharyngeal reflux disease) 04/09/2015   Allergic rhinoconjunctivitis 04/09/2015   HEMATOCHEZIA 04/01/2010   COUGH 04/01/2010   GASTRITIS 09/30/2009   NAUSEA 09/30/2009   ABDOMINAL PAIN-EPIGASTRIC 09/30/2009   DYSTHYMIC  DISORDER 09/26/2009   PALPITATIONS 06/17/2009   Impingement syndrome of shoulder, left 06/04/2009   CONSTIPATION 02/05/2009   PERSONAL HX COLONIC POLYPS 02/05/2009   ASTHMA, PERSISTENT, MILD 02/04/2009   CHEST PAIN, ATYPICAL 02/04/2009    COLONIC POLYPS, BENIGN 12/18/2008   Hyperlipidemia LDL goal <70 12/18/2008   Insomnia 12/18/2008   MIGRAINE, COMMON 12/18/2008   Essential hypertension 12/18/2008   ALLERGIC RHINITIS 12/18/2008   Primary osteoarthritis of both hands 12/18/2008   DJD (degenerative joint disease), cervical 12/18/2008   NECK PAIN, CHRONIC 12/18/2008   LOW BACK PAIN, CHRONIC 12/18/2008    Past Medical History:  Diagnosis Date   Adenomatous colon polyp 10/1997, and 03/2009   Allergic rhinitis    Allergy    Aortic atherosclerosis    Arthritis    osteo   Asthma    B12 deficiency    Chronic gastritis    Coronary artery calcification seen on CT scan    Fibromyalgia    GERD (gastroesophageal reflux disease)    Hemorrhoids    Hyperlipidemia    Hypertension    Menorrhagia    partial hysterectomy   Migraines    Osteoarthritis    PONV (postoperative nausea and vomiting)    Premature atrial contractions    Sleep disturbance    STD (sexually transmitted disease) 01/22/11 culture proven   HSV type I labia    Family History  Problem Relation Age of Onset   Coronary artery disease Mother    Hypertension Mother    Hyperlipidemia Mother    Pulmonary fibrosis Mother    Lung disease Mother    Throat cancer Father    Coronary artery disease Father    Heart attack Father [id number]   Stroke Father        during procedure   Prostate cancer Brother    Heart disease Brother    Diabetes Brother    Diabetes Paternal Aunt        x 2   Prostate cancer Paternal Uncle    Stroke Maternal Grandmother        or MI   Diabetes Paternal Grandmother    Coronary artery disease Paternal Grandmother    Migraines Daughter    Migraines Daughter    Colon cancer Neg Hx    Past Surgical History:  Procedure Laterality Date   CATARACT EXTRACTION Bilateral 10/16 & 11/16   EXCISION METACARPAL MASS Right 11/16/2016   Procedure: RIGHT LITTLE FINGER CYST EXCISION;  Surgeon: Mack Hookhompson, David, MD;  Location: [facility];  Service: Orthopedics;  Laterality: Right;   TONSILLECTOMY  age 72   tooth extract     with bone graft   Turbinate sinus surgery  2003   UPPER GI ENDOSCOPY  07/2022   VAGINAL HYSTERECTOMY  1998   secondary to prolapse, ovaries remain   Social History   Social History Narrative   She lives with husband.  They have 2 grown children.   She is retired Psychiatric nursecompliance specialist.   Highest level of education:  2 years of college   Right handed   Regular exercise, diet of fruits, veggies, limited fried foods, limited water      [facility]:   Originally from [place]. Has also lived in [place] & [place]. Previously worked doing Warden/rangeroffice administration. No pets currently. No bird, mold, or hot tub exposure. Does have a musty smell in their home but it is new. Enjoys reading. 1 indoor plant. Has carpet in her home including in the bedroom.  Also has draperies.    Right handed   Immunization History  Administered Date(s) Administered   Influenza Split 04/02/2014   Influenza, Seasonal, Injecte, Preservative Fre 04/09/2015   Influenza,inj,Quad PF,6+ Mos 01/14/2016, 03/04/2017, 01/24/2018   Influenza-Unspecified 02/08/2019, 02/25/2020, 02/17/2021   PFIZER(Purple Top)SARS-COV-2 Vaccination 06/23/2019, 07/18/2019, 04/04/2020   Pneumococcal Conjugate-13 11/01/2015, 11/25/2015   Pneumococcal Polysaccharide-23 11/20/2013   Tdap 11/20/2009     Objective: Vital Signs: BP (!) 156/83 (BP Location: Left Arm, Patient Position: Sitting, Cuff Size: Normal)   Pulse 98   Resp 15   Ht 5\' 4"  (1.626 m)   Wt 158 lb 9.6 oz (71.9 kg)   LMP 07/02/1994   BMI 27.22 kg/m    Physical Exam Vitals and nursing note reviewed.  Constitutional:      Appearance: She is well-developed.  HENT:     Head: Normocephalic and atraumatic.  Eyes:     Conjunctiva/sclera: Conjunctivae normal.  Cardiovascular:     Rate and Rhythm: Normal rate and regular rhythm.     Heart sounds: Normal heart sounds.   Pulmonary:     Effort: Pulmonary effort is normal.     Breath sounds: Normal breath sounds.  Abdominal:     General: Bowel sounds are normal.     Palpations: Abdomen is soft.  Musculoskeletal:     Cervical back: Normal range of motion.  Lymphadenopathy:     Cervical: No cervical adenopathy.  Skin:    General: Skin is warm and dry.     Capillary Refill: Capillary refill takes less than 2 seconds.  Neurological:     Mental Status: She is alert and oriented to person, place, and time.  Psychiatric:        Behavior: Behavior normal.      Musculoskeletal Exam: Head some limitation with range of motion of the cervical spine on the lateral rotation.  She had bilateral trapezius spasm.  There was no tenderness over thoracic spine.  She has some tenderness over SI joints.  And trochanteric bursa.  Shoulder joints, elbow joints, wrist joints, MCPs were in good range of motion.  She had bilateral PIP and DIP thickening with no synovitis.  Hip joints and knee joints were in good range of motion without any warmth swelling or effusion.  There was no tenderness over ankles or MTPs.  CDAI Exam: CDAI Score: -- Patient Global: --; Provider Global: -- Swollen: --; Tender: -- Joint Exam 08/11/2022   No joint exam has been documented for this visit   There is currently no information documented on the homunculus. Go to the Rheumatology activity and complete the homunculus joint exam.  Investigation: No additional findings.  Imaging: No results found.  Recent Labs: Lab Results  Component Value Date   WBC 4.1 12/08/2020   HGB 12.5 12/08/2020   PLT 262 12/08/2020   NA 144 12/08/2020   K 4.1 12/08/2020   CL 107 (H) 12/08/2020   CO2 25 12/08/2020   GLUCOSE 101 (H) 12/08/2020   BUN 15 12/08/2020   CREATININE 0.92 12/08/2020   BILITOT <0.2 12/08/2020   ALKPHOS 77 12/08/2020   AST 20 12/08/2020   ALT 13 12/08/2020   PROT 6.5 12/08/2020   ALBUMIN 3.8 12/08/2020   CALCIUM 9.2 12/08/2020    GFRAA 87 08/05/2020    Speciality Comments: No specialty comments available.  Procedures:  No procedures performed Allergies: Trazodone hcl, Clindamycin/lincomycin, and Penicillins   Assessment / Plan:     Visit Diagnoses: Fibromyalgia -she continues to have some  generalized pain and discomfort.  She has hyperalgesia and positive tender points.  She had bilateral trapezius spasm.  She has been taking methocarbamol 500 mg 1 tablet daily as needed for muscle spasms.  Need for regular stretching was discussed.  Benefits of water aerobics and summing was also discussed.  Other insomnia -she is on Ambien 5 mg 1 tablet by mouth at bedtime for insomnia.  Good sleep hygiene was discussed.  Other fatigue-related to insomnia and fibromyalgia.  Patient states she was also diagnosed with B12 deficiency.  She has been taking B12 supplement.  DDD (degenerative disc disease), cervical - diclofenac 50 mg 1 tablet twice daily as needed for pain relief.  She was seeing Dr. Otelia Sergeant in the past.  She will [facility]  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  Chronic pain of both shoulders-she had good range of motion without discomfort today.  Primary osteoarthritis of both hands-she had bilateral B and DIP thickening.  Joint protection muscle strengthening was discussed.  A handout on hand exercises was given.  Trochanteric bursitis of both hips-she had mild tenderness over trochanteric region.  IT band stretches were emphasized.  Primary osteoarthritis of both knees-she has off-and-on discomfort in her knee joints.  She states she had discomfort in her left knee few months back.  She had x-rays which were unremarkable except for mild osteoarthritis.  A handout on lower extremity muscle strengthening exercise was given.  Plantar fasciitis, right-resolved.  History of hypertension-blood pressure was elevated at 156/83.  She was advised to monitor blood pressure closely  and follow-up with her PCP.  Other medical problems are listed as follows:  History of gastroesophageal reflux (GERD)  History of colon polyps  History of hyperlipidemia  History of asthma  History of migraine  Orders: No orders of the defined types were placed in this encounter.  No orders of the defined types were placed in this encounter.    Follow-Up Instructions: No follow-ups on file.   Pollyann Savoy, MD  Note - This record has been created using Animal nutritionist.  Chart creation errors have been sought, but may not always  have been located. Such creation errors do not reflect on  the standard of medical care.

## 2022-08-04 ENCOUNTER — Other Ambulatory Visit: Payer: Self-pay

## 2022-08-04 DIAGNOSIS — K2970 Gastritis, unspecified, without bleeding: Secondary | ICD-10-CM

## 2022-08-04 DIAGNOSIS — K209 Esophagitis, unspecified without bleeding: Secondary | ICD-10-CM

## 2022-08-04 DIAGNOSIS — R1310 Dysphagia, unspecified: Secondary | ICD-10-CM

## 2022-08-04 DIAGNOSIS — K219 Gastro-esophageal reflux disease without esophagitis: Secondary | ICD-10-CM

## 2022-08-04 NOTE — Telephone Encounter (Signed)
Inbound call from patient, calling to follow up on results.

## 2022-08-04 NOTE — Telephone Encounter (Signed)
Dr Fuller Plan the pt is calling for path results from 3/13.  Please advise

## 2022-08-04 NOTE — Telephone Encounter (Signed)
See path result note

## 2022-08-10 ENCOUNTER — Other Ambulatory Visit: Payer: Self-pay | Admitting: Allergy and Immunology

## 2022-08-11 ENCOUNTER — Ambulatory Visit: Payer: Medicare Other | Attending: Rheumatology | Admitting: Rheumatology

## 2022-08-11 ENCOUNTER — Encounter: Payer: Self-pay | Admitting: Rheumatology

## 2022-08-11 VITALS — BP 156/83 | HR 98 | Resp 15 | Ht 64.0 in | Wt 158.6 lb

## 2022-08-11 DIAGNOSIS — M19042 Primary osteoarthritis, left hand: Secondary | ICD-10-CM

## 2022-08-11 DIAGNOSIS — M797 Fibromyalgia: Secondary | ICD-10-CM

## 2022-08-11 DIAGNOSIS — M170 Bilateral primary osteoarthritis of knee: Secondary | ICD-10-CM

## 2022-08-11 DIAGNOSIS — Z8639 Personal history of other endocrine, nutritional and metabolic disease: Secondary | ICD-10-CM

## 2022-08-11 DIAGNOSIS — R5383 Other fatigue: Secondary | ICD-10-CM

## 2022-08-11 DIAGNOSIS — M47816 Spondylosis without myelopathy or radiculopathy, lumbar region: Secondary | ICD-10-CM

## 2022-08-11 DIAGNOSIS — M7062 Trochanteric bursitis, left hip: Secondary | ICD-10-CM

## 2022-08-11 DIAGNOSIS — G4709 Other insomnia: Secondary | ICD-10-CM | POA: Diagnosis not present

## 2022-08-11 DIAGNOSIS — Z8709 Personal history of other diseases of the respiratory system: Secondary | ICD-10-CM

## 2022-08-11 DIAGNOSIS — G8929 Other chronic pain: Secondary | ICD-10-CM

## 2022-08-11 DIAGNOSIS — M25512 Pain in left shoulder: Secondary | ICD-10-CM

## 2022-08-11 DIAGNOSIS — M5030 Other cervical disc degeneration, unspecified cervical region: Secondary | ICD-10-CM | POA: Diagnosis not present

## 2022-08-11 DIAGNOSIS — M7061 Trochanteric bursitis, right hip: Secondary | ICD-10-CM

## 2022-08-11 DIAGNOSIS — Z8679 Personal history of other diseases of the circulatory system: Secondary | ICD-10-CM

## 2022-08-11 DIAGNOSIS — M19041 Primary osteoarthritis, right hand: Secondary | ICD-10-CM

## 2022-08-11 DIAGNOSIS — M722 Plantar fascial fibromatosis: Secondary | ICD-10-CM

## 2022-08-11 DIAGNOSIS — Z8719 Personal history of other diseases of the digestive system: Secondary | ICD-10-CM

## 2022-08-11 DIAGNOSIS — Z860100 Personal history of colon polyps, unspecified: Secondary | ICD-10-CM

## 2022-08-11 DIAGNOSIS — E538 Deficiency of other specified B group vitamins: Secondary | ICD-10-CM

## 2022-08-11 DIAGNOSIS — M25511 Pain in right shoulder: Secondary | ICD-10-CM

## 2022-08-11 DIAGNOSIS — Z8669 Personal history of other diseases of the nervous system and sense organs: Secondary | ICD-10-CM

## 2022-08-11 NOTE — Patient Instructions (Signed)
Hand Exercises Hand exercises can be helpful for almost anyone. These exercises can strengthen the hands, improve flexibility and movement, and increase blood flow to the hands. These results can make work and daily tasks easier. Hand exercises can be especially helpful for people who have joint pain from arthritis or have nerve damage from overuse (carpal tunnel syndrome). These exercises can also help people who have injured a hand. Exercises Most of these hand exercises are gentle stretching and motion exercises. It is usually safe to do them often throughout the day. Warming up your hands before exercise may help to reduce stiffness. You can do this with gentle massage or by placing your hands in warm water for 10-15 minutes. It is normal to feel some stretching, pulling, tightness, or mild discomfort as you begin new exercises. This will gradually improve. Stop an exercise right away if you feel sudden, severe pain or your pain gets worse. Ask your health care provider which exercises are best for you. Knuckle bend or "claw" fist  Stand or sit with your arm, hand, and all five fingers pointed straight up. Make sure to keep your wrist straight during the exercise. Gently bend your fingers down toward your palm until the tips of your fingers are touching the top of your palm. Keep your big knuckle straight and just bend the small knuckles in your fingers. Hold this position for __________ seconds. Straighten (extend) your fingers back to the starting position. Repeat this exercise 5-10 times with each hand. Full finger fist  Stand or sit with your arm, hand, and all five fingers pointed straight up. Make sure to keep your wrist straight during the exercise. Gently bend your fingers into your palm until the tips of your fingers are touching the middle of your palm. Hold this position for __________ seconds. Extend your fingers back to the starting position, stretching every joint fully. Repeat  this exercise 5-10 times with each hand. Straight fist Stand or sit with your arm, hand, and all five fingers pointed straight up. Make sure to keep your wrist straight during the exercise. Gently bend your fingers at the big knuckle, where your fingers meet your hand, and the middle knuckle. Keep the knuckle at the tips of your fingers straight and try to touch the bottom of your palm. Hold this position for __________ seconds. Extend your fingers back to the starting position, stretching every joint fully. Repeat this exercise 5-10 times with each hand. Tabletop  Stand or sit with your arm, hand, and all five fingers pointed straight up. Make sure to keep your wrist straight during the exercise. Gently bend your fingers at the big knuckle, where your fingers meet your hand, as far down as you can while keeping the small knuckles in your fingers straight. Think of forming a tabletop with your fingers. Hold this position for __________ seconds. Extend your fingers back to the starting position, stretching every joint fully. Repeat this exercise 5-10 times with each hand. Finger spread  Place your hand flat on a table with your palm facing down. Make sure your wrist stays straight as you do this exercise. Spread your fingers and thumb apart from each other as far as you can until you feel a gentle stretch. Hold this position for __________ seconds. Bring your fingers and thumb tight together again. Hold this position for __________ seconds. Repeat this exercise 5-10 times with each hand. Making circles  Stand or sit with your arm, hand, and all five fingers pointed   straight up. Make sure to keep your wrist straight during the exercise. Make a circle by touching the tip of your thumb to the tip of your index finger. Hold for __________ seconds. Then open your hand wide. Repeat this motion with your thumb and each finger on your hand. Repeat this exercise 5-10 times with each hand. Thumb  motion  Sit with your forearm resting on a table and your wrist straight. Your thumb should be facing up toward the ceiling. Keep your fingers relaxed as you move your thumb. Lift your thumb up as high as you can toward the ceiling. Hold for __________ seconds. Bend your thumb across your palm as far as you can, reaching the tip of your thumb for the small finger (pinkie) side of your palm. Hold for __________ seconds. Repeat this exercise 5-10 times with each hand. Grip strengthening  Hold a stress ball or other soft ball in the middle of your hand. Slowly increase the pressure, squeezing the ball as much as you can without causing pain. Think of bringing the tips of your fingers into the middle of your palm. All of your finger joints should bend when doing this exercise. Hold your squeeze for __________ seconds, then relax. Repeat this exercise 5-10 times with each hand. Contact a health care provider if: Your hand pain or discomfort gets much worse when you do an exercise. Your hand pain or discomfort does not improve within 2 hours after you exercise. If you have any of these problems, stop doing these exercises right away. Do not do them again unless your health care provider says that you can. Get help right away if: You develop sudden, severe hand pain or swelling. If this happens, stop doing these exercises right away. Do not do them again unless your health care provider says that you can. This information is not intended to replace advice given to you by your health care provider. Make sure you discuss any questions you have with your health care provider. Document Revised: 07/31/2020 Document Reviewed: 08/07/2020 Elsevier Patient Education  2023 [employer]  Exercises for Chronic Knee Pain Chronic knee pain is pain that lasts longer than 3 months. For most people with chronic knee pain, exercise and weight loss is an important part of treatment. Your health care provider may want  you to focus on: Strengthening the muscles that support your knee. This can take pressure off your knee and lessen pain. Preventing knee stiffness. Maintaining or increasing how far you can move your knee. Losing weight (if this applies) to take pressure off your knee, decrease your risk for injury, and make it easier for you to exercise. Your health care provider will help you develop an exercise program that matches your needs and physical abilities. Below are simple, low-impact exercises you can do at home. Ask your health care provider or a physical therapist how often you should do your exercise program and how many times to repeat each exercise. General safety tips Follow these safety tips for exercising with chronic knee pain: Get your health care provider's approval before doing any exercises. Start slowly and stop any time an exercise causes pain. Do not exercise if your knee pain is flaring up. Warm up first. Stretching a cold muscle can cause an injury. Do 5-10 minutes of easy movement or light stretching before beginning your exercise routine. Do 5-10 minutes of low-impact activity (like walking or cycling) before starting strengthening exercises. Contact your health care provider any time you have   pain during or after exercising. Exercise may cause discomfort but should not be painful. It is normal to be a little stiff or sore after exercising.  Stretching and range-of-motion exercises Front thigh stretch  Stand up straight and support your body by holding on to a chair or resting one hand on a wall. With your legs straight and close together, bend one knee to lift your heel up toward your buttocks. Using one hand for support, grab your ankle with your free hand. Pull your foot up closer toward your buttocks to feel the stretch in front of your thigh. Hold the stretch for 30 seconds. Repeat __________ times. Complete this exercise __________ times a day. Back thigh stretch  Sit  on the floor with your back straight and your legs out straight in front of you. Place the palms of your hands on the floor and slide them toward your feet as you bend at the hip. Try to touch your nose to your knees and feel the stretch in the back of your thighs. Hold for 30 seconds. Repeat __________ times. Complete this exercise __________ times a day. Calf stretch  Stand facing a wall. Place the palms of your hands flat against the wall, arms extended, and lean slightly against the wall. Get into a lunge position with one leg bent at the knee and the other leg stretched out straight behind you. Keep both feet facing the wall and increase the bend in your knee while keeping the heel of the other leg flat on the ground. You should feel the stretch in your calf. Hold for 30 seconds. Repeat __________ times. Complete this exercise __________ times a day. Strengthening exercises Straight leg lift Lie on your back with one knee bent and the other leg out straight. Slowly lift the straight leg without bending the knee. Lift until your foot is about 12 inches (30 cm) off the floor. Hold for 3-5 seconds and slowly lower your leg. Repeat __________ times. Complete this exercise __________ times a day. Single leg dip Stand between two chairs and put both hands on the backs of the chairs for support. Extend one leg out straight with your body weight resting on the heel of the standing leg. Slowly bend your standing knee to dip your body to the level that is comfortable for you. Hold for 3-5 seconds. Repeat __________ times. Complete this exercise __________ times a day. Hamstring curls Stand straight, knees close together, facing the back of a chair. Hold on to the back of a chair with both hands. Keep one leg straight. Bend the other knee while bringing the heel up toward the buttock until the knee is bent at a 90-degree angle (right angle). Hold for 3-5 seconds. Repeat __________ times.  Complete this exercise __________ times a day. Wall squat Stand straight with your back, hips, and head against a wall. Step forward one foot at a time with your back still against the wall. Your feet should be 2 feet (61 cm) from the wall at shoulder width. Keeping your back, hips, and head against the wall, slide down the wall to as close of a sitting position as you can get. Hold for 5-10 seconds, then slowly slide back up. Repeat __________ times. Complete this exercise __________ times a day. Step-ups Step up with one foot onto a sturdy platform or stool that is about 6 inches (15 cm) high. Face sideways with one foot on the platform and one on the ground. Place all your weight   on the platform foot and lift your body off the ground until your knee extends. Let your other leg hang free to the side. Hold for 3-5 seconds then slowly lower your weight down to the floor foot. Repeat __________ times. Complete this exercise __________ times a day. Contact a health care provider if: Your exercise causes pain. Your pain is worse after you exercise. Your pain prevents you from doing your exercises. This information is not intended to replace advice given to you by your health care provider. Make sure you discuss any questions you have with your health care provider. Document Revised: 08/23/2019 Document Reviewed: 04/16/2019 Elsevier Patient Education  2023 [employer].  

## 2022-08-17 ENCOUNTER — Encounter: Payer: Self-pay | Admitting: Obstetrics and Gynecology

## 2022-08-17 ENCOUNTER — Other Ambulatory Visit: Payer: Self-pay | Admitting: Physician Assistant

## 2022-08-17 ENCOUNTER — Ambulatory Visit: Payer: Medicare Other | Admitting: Obstetrics and Gynecology

## 2022-08-17 VITALS — BP 110/72 | HR 66 | Wt 155.0 lb

## 2022-08-17 DIAGNOSIS — R102 Pelvic and perineal pain: Secondary | ICD-10-CM | POA: Diagnosis not present

## 2022-08-17 DIAGNOSIS — N819 Female genital prolapse, unspecified: Secondary | ICD-10-CM | POA: Diagnosis not present

## 2022-08-17 LAB — URINALYSIS, COMPLETE
Bacteria, UA: NONE SEEN /HPF
Bilirubin Urine: NEGATIVE
Glucose, UA: NEGATIVE
Hgb urine dipstick: NEGATIVE
Ketones, ur: NEGATIVE
Leukocytes,Ua: NEGATIVE
Nitrite: NEGATIVE
Protein, ur: NEGATIVE
RBC / HPF: NONE SEEN /HPF (ref 0–2)
Specific Gravity, Urine: 1.025 (ref 1.001–1.035)
WBC, UA: NONE SEEN /HPF (ref 0–5)
pH: 5.5 (ref 5.0–8.0)

## 2022-08-17 NOTE — Progress Notes (Signed)
GYNECOLOGY  VISIT   HPI: 72 y.o.   Married Black or [place] American Not Hispanic or Latino  female   [phone] with Patient's last menstrual period was 07/02/1994.   here for prolapse. She lift some things while cleaning out a closet, after that she felt discomfort in her vagina. Since then she has had some random lower abdominal pains. She notices an intermittent vaginal bulge.  Her urine stream is going everywhere.  No itching, burning or irritation.  No urinary frequency, urgency or dysuria.  Some hesitancy to void at times. Overall she feels she empties her bladder. No leakage.  She used to have constipation. She c/o a 2.5 month h/o loose stools. She saw GI, colonoscopy is UTD. He thought her diet was causing the bowel changes. She has cut out dairy and it hasn't helped.   She has a h/o grade 2 cystocele and rectocele, grade 1 vault prolapse   GYNECOLOGIC HISTORY: Patient's last menstrual period was 07/02/1994. Contraception:pmp Menopausal hormone therapy: none         OB History     Gravida  2   Para  2   Term  2   Preterm  0   AB  0   Living  2      SAB  0   IAB  0   Ectopic  0   Multiple  0   Live Births  2              Patient Active Problem List   Diagnosis Date Noted   Anxiety 05/21/2021   Dyslipidemia 05/21/2021   Dysphagia 05/21/2021   External hemorrhoids without complication 05/21/2021   Generalized anxiety disorder 05/21/2021   Hardening of the aorta (main artery of the heart) 05/21/2021   History of hysterectomy 05/21/2021   Thyroid nodule 05/21/2021   Kidney stone 05/21/2021   Multinodular goiter 05/21/2021   Osteopenia 05/21/2021   Pure hypercholesterolemia 05/21/2021   Sciatica 05/21/2021   Stress due to family tension 05/21/2021   Vitamin D deficiency 05/21/2021   Cervical disc disease 05/21/2021   Unspecified thoracic, thoracolumbar and lumbosacral intervertebral disc disorder 05/21/2021   Dysosmia 12/18/2019   Cough variant  asthma vs uacs  04/13/2018   Fibromyalgia 04/19/2016   Other fatigue 04/19/2016   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   Primary osteoarthritis of both knees 04/19/2016   Leg pain 01/22/2016   Restless legs syndrome 01/22/2016   Snoring 11/19/2015   Periodic limb movement sleep disorder 11/19/2015   GERD (gastroesophageal reflux disease) 10/08/2015   Mild persistent asthma 04/09/2015   LPRD (laryngopharyngeal reflux disease) 04/09/2015   Allergic rhinoconjunctivitis 04/09/2015   HEMATOCHEZIA 04/01/2010   COUGH 04/01/2010   GASTRITIS 09/30/2009   NAUSEA 09/30/2009   ABDOMINAL PAIN-EPIGASTRIC 09/30/2009   DYSTHYMIC DISORDER 09/26/2009   PALPITATIONS 06/17/2009   Impingement syndrome of shoulder, left 06/04/2009   CONSTIPATION 02/05/2009   PERSONAL HX COLONIC POLYPS 02/05/2009   ASTHMA, PERSISTENT, MILD 02/04/2009   CHEST PAIN, ATYPICAL 02/04/2009   COLONIC POLYPS, BENIGN 12/18/2008   Hyperlipidemia LDL goal <70 12/18/2008   Insomnia 12/18/2008   MIGRAINE, COMMON 12/18/2008   Essential hypertension 12/18/2008   ALLERGIC RHINITIS 12/18/2008   Primary osteoarthritis of both hands 12/18/2008   DJD (degenerative joint disease), cervical 12/18/2008   NECK PAIN, CHRONIC 12/18/2008   LOW BACK PAIN, CHRONIC 12/18/2008    Past Medical History:  Diagnosis Date   Adenomatous colon polyp 10/1997, and 03/2009   Allergic rhinitis    Allergy  Aortic atherosclerosis    Arthritis    osteo   Asthma    B12 deficiency    Chronic gastritis    Coronary artery calcification seen on CT scan    Fibromyalgia    GERD (gastroesophageal reflux disease)    Hemorrhoids    Hyperlipidemia    Hypertension    Menorrhagia    partial hysterectomy   Migraines    Osteoarthritis    PONV (postoperative nausea and vomiting)    Premature atrial contractions    Sleep disturbance    STD (sexually transmitted disease) 01/22/11 culture proven   HSV type I labia    Past Surgical History:  Procedure Laterality  Date   CATARACT EXTRACTION Bilateral 10/16 & 11/16   EXCISION METACARPAL MASS Right 11/16/2016   Procedure: RIGHT LITTLE FINGER CYST EXCISION;  Surgeon: Mack Hook, MD;  Location: [facility];  Service: Orthopedics;  Laterality: Right;   TONSILLECTOMY  age 62   tooth extract     with bone graft   Turbinate sinus surgery  2003   UPPER GI ENDOSCOPY  07/2022   VAGINAL HYSTERECTOMY  1998   secondary to prolapse, ovaries remain    Current Outpatient Medications  Medication Sig Dispense Refill   ADVAIR HFA 115-21 MCG/ACT inhaler Inhale 2 puffs into the lungs 2 (two) times daily.     albuterol (VENTOLIN HFA) 108 (90 Base) MCG/ACT inhaler Inhale 2 puffs into the lungs every 6 (six) hours as needed for wheezing or shortness of breath. 18 g 2   ALPRAZolam (XANAX) 0.5 MG tablet as needed.     Ascorbic Acid (VITAMIN C PO) Take 1 tablet by mouth daily.     Azelastine-Fluticasone 137-50 MCG/ACT SUSP SHAKE LIQUID AND USE ONE SPRAY IN EACH NOSTRIL ONE TO TWO TIMES DAILY. 23 g 5   CALCIUM PO Take 1 tablet by mouth daily.     candesartan (ATACAND) 16 MG tablet Take 8 mg by mouth at bedtime.     Carbinoxamine Maleate 4 MG TABS Can take one to two tablets up to three times daily if needed. 180 tablet 5   cetirizine (ZYRTEC) 10 MG tablet Take 1 tablet (10 mg total) by mouth daily. One tab daily for allergies 30 tablet 1   Cyanocobalamin (B-12 COMPLIANCE INJECTION IJ) Inject as directed. Once monthly     diclofenac (CATAFLAM) 50 MG tablet Take 1 tablet (50 mg total) by mouth 2 (two) times daily. 180 tablet 4   diclofenac Sodium (VOLTAREN) 1 % GEL Apply 2 g topically 4 (four) times daily. 350 g 3   dicyclomine (BENTYL) 10 MG capsule Take 1 capsule (10 mg total) by mouth 3 (three) times daily as needed for spasms. 90 capsule 11   famotidine (PEPCID) 40 MG tablet Take 1 tablet (40 mg total) by mouth at bedtime. 90 tablet 1   methocarbamol (ROBAXIN) 500 MG tablet TAKE 1 TABLET BY MOUTH DAILY AS  NEEDED FOR MUSCLE SPASMS 30 tablet 2   montelukast (SINGULAIR) 10 MG tablet TAKE 1 TABLET(10 MG) BY MOUTH DAILY 90 tablet 1   Multiple Vitamin (MULTI-VITAMIN) tablet Take by mouth.     pantoprazole (PROTONIX) 40 MG tablet TAKE 1 TABLET(40 MG) BY MOUTH TWICE DAILY 180 tablet 1   rosuvastatin (CRESTOR) 20 MG tablet TAKE 1 TABLET(20 MG) BY MOUTH DAILY 90 tablet 3   VITAMIN A PO Take by mouth daily.     zolmitriptan (ZOMIG-ZMT) 5 MG disintegrating tablet Take 1 tablet (5 mg total) by mouth  as needed for migraine. 10 tablet 11   zolpidem (AMBIEN) 5 MG tablet TAKE 1 TABLET(5 MG) BY MOUTH AT BEDTIME AS NEEDED FOR SLEEP 30 tablet 0   Cholecalciferol (VITAMIN D) 2000 units tablet Take 2,000 Units by mouth daily. (Patient not taking: Reported on 08/11/2022)     clotrimazole (MYCELEX) 10 MG troche CAN USE 1 TROCHE IN MOUTH ONCE DAILY AS NEEDED (Patient not taking: Reported on 08/11/2022) 30 Troche 5   ipratropium-albuterol (DUONEB) 0.5-2.5 (3) MG/3ML SOLN Use one vial in the nebulizer every 4-6 hours if needed for cough or wheeze. (Patient not taking: Reported on 08/17/2022) 300 mL 1   Lifitegrast 5 % SOLN Apply 1 drop to eye 2 (two) times daily.     No current facility-administered medications for this visit.     ALLERGIES: Trazodone hcl, Clindamycin/lincomycin, and Penicillins  Family History  Problem Relation Age of Onset   Coronary artery disease Mother    Hypertension Mother    Hyperlipidemia Mother    Pulmonary fibrosis Mother    Lung disease Mother    Throat cancer Father    Coronary artery disease Father    Heart attack Father 60   Stroke Father        during procedure   Prostate cancer Brother    Heart disease Brother    Diabetes Brother    Diabetes Paternal Aunt        x 2   Prostate cancer Paternal Uncle    Stroke Maternal Grandmother        or MI   Diabetes Paternal Grandmother    Coronary artery disease Paternal Grandmother    Migraines Daughter    Migraines Daughter    Colon  cancer Neg Hx     Social History   Socioeconomic History   Marital status: Married    Spouse name: Not on file   Number of children: 2   Years of education: Not on file   Highest education level: Not on file  Occupational History   Occupation: Retired    Comment:  regulatory compliance specialist unemployed sincesince 6/10  Tobacco Use   Smoking status: Never    Passive exposure: Past   Smokeless tobacco: Never   Tobacco comments:    Father growing up  Vaping Use   Vaping Use: Never used  Substance and Sexual Activity   Alcohol use: No    Alcohol/week: 0.0 standard drinks of alcohol   Drug use: Never   Sexual activity: Yes    Partners: Male    Birth control/protection: Surgical    Comment: hysterectomy  Other Topics Concern   Not on file  Social History Narrative   She lives with husband.  They have 2 grown children.   She is retired Psychiatric nurse.   Highest level of education:  2 years of college   Right handed   Regular exercise, diet of fruits, veggies, limited fried foods, limited water      [facility]:   Originally from [place]. Has also lived in [place] & [place]. Previously worked doing Warden/ranger. No pets currently. No bird, mold, or hot tub exposure. Does have a musty smell in their home but it is new. Enjoys reading. 1 indoor plant. Has carpet in her home including in the bedroom. Also has draperies.    Right handed   Social Determinants of Health   Financial Resource Strain: Not on file  Food Insecurity: Not on file  Transportation Needs: Not on file  Physical Activity: Not on file  Stress: Not on file  Social Connections: Not on file  Intimate Partner Violence: Not on file    ROS  PHYSICAL EXAMINATION:    BP 110/72   Pulse 66   Wt 155 lb (70.3 kg)   LMP 07/02/1994   SpO2 100%   BMI 26.61 kg/m     General appearance: alert, cooperative and appears stated age Neck: no adenopathy, supple, symmetrical, trachea  midline and thyroid normal to inspection and palpation Breasts: normal appearance, no masses or tenderness Abdomen: soft, non-tender; non distended, no masses,  no organomegaly  Pelvic: External genitalia:  no lesions              Urethra:  normal appearing urethra with no masses, tenderness or lesions              Bartholins and Skenes: normal                 Vagina: atrophic appearing vagina with a moderate grade 2 cystocele, small grade 2 rectocele and grade 1 vault prolapse              Cervix: no lesions              Bimanual Exam:  Uterus:  uterus absent              Adnexa: no mass, fullness, tenderness              Rectovaginal: Yes.  .  Confirms.              Anus:  normal sphincter tone, no lesions  Chaperone was present for exam.  1. Combined abdominal and pelvic pain No specific findings on exam to explain her pain, it could be related to her bowel changes. Call if it persists.  - Urinalysis, Complete  2. Female genital prolapse, unspecified type Stable. Not frequently bothersome.  Avoid heavy lifting and straining Discussed options of doing nothing, trial of pessary or surgery. She prefers to do nothing at this time.   Over 20 minutes spent in total patient care.

## 2022-08-18 ENCOUNTER — Other Ambulatory Visit: Payer: Self-pay

## 2022-08-18 ENCOUNTER — Other Ambulatory Visit: Payer: Medicare Other

## 2022-08-18 DIAGNOSIS — R1310 Dysphagia, unspecified: Secondary | ICD-10-CM

## 2022-08-18 DIAGNOSIS — K219 Gastro-esophageal reflux disease without esophagitis: Secondary | ICD-10-CM

## 2022-08-18 DIAGNOSIS — K2970 Gastritis, unspecified, without bleeding: Secondary | ICD-10-CM

## 2022-08-18 DIAGNOSIS — K209 Esophagitis, unspecified without bleeding: Secondary | ICD-10-CM

## 2022-08-18 NOTE — Telephone Encounter (Signed)
Last Fill: 07/19/2022  Next Visit: 02/09/2023  Last Visit: 08/11/2022  Dx: Other insomnia   Current Dose per office note on 08/11/2022: Ambien 5 mg 1 tablet by mouth at bedtime for insomnia   Okay to refill Ambien?

## 2022-08-21 LAB — INTRINSIC FACTOR ANTIBODIES: Intrinsic Factor: NEGATIVE

## 2022-08-21 LAB — GASTRIN: Gastrin: 137 pg/mL — ABNORMAL HIGH (ref <=100)

## 2022-08-27 LAB — ANTI-PARIETAL ANTIBODY: PARIETAL CELL AB SCREEN: POSITIVE — AB

## 2022-08-27 LAB — REFLEX PARIETAL CELL AB TITER: PARIETAL CELL AB TITER: 1:640 {titer} — ABNORMAL HIGH

## 2022-09-08 ENCOUNTER — Encounter: Payer: Self-pay | Admitting: Gastroenterology

## 2022-09-08 ENCOUNTER — Ambulatory Visit: Payer: Medicare Other | Admitting: Gastroenterology

## 2022-09-08 VITALS — BP 136/76 | HR 83 | Ht 64.0 in | Wt 155.0 lb

## 2022-09-08 DIAGNOSIS — K219 Gastro-esophageal reflux disease without esophagitis: Secondary | ICD-10-CM | POA: Diagnosis not present

## 2022-09-08 DIAGNOSIS — K2940 Chronic atrophic gastritis without bleeding: Secondary | ICD-10-CM

## 2022-09-08 NOTE — Patient Instructions (Signed)
Stay on famotidine 40 mg daily or twice daily.   Take your dicyclomine 30-60 minutes before meals.   Patient advised to avoid spicy, acidic, citrus, chocolate, mints, fruit and fruit juices.  Limit the intake of caffeine, alcohol and Soda.  Don't exercise too soon after eating.  Don't lie down within 3-4 hours of eating.  Elevate the head of your bed.  RECTAL CARE INSTRUCTIONS:  1. Sitz Baths twice a day for 10 minutes each. 2. Thoroughly clean and dry the rectum. 3. Put Tucks pad against the rectum at night. 4. Clean the rectum with Balenol lotion after each bowel movement.  Also, you can use witch hazel three times a day as needed.   The [facility] providers would like to encourage you to use [facility] to communicate with providers for non-urgent requests or questions.  Due to long hold times on the telephone, sending your provider a message by [facility] may be a faster and more efficient way to get a response.  Please allow 48 business hours for a response.  Please remember that this is for non-urgent requests.   Thank you for choosing me and [facility].  Venita Lick. Pleas Koch., MD., Clementeen Graham

## 2022-09-08 NOTE — Progress Notes (Signed)
    Assessment     GERD, suspected LPR, globus sensation and history of an esophageal stricture IBS-D Atrophic gastritis B12 deficiency, managed by PCP External hemorrhoids   Recommendations    Famotidine 40 mg bid or qd per patient, follow up antireflux measures Dicyclomine 10 mg po tid ac, avoid foods, beverages that trigger symptoms. Her questions about atrophic gastritis and intestinal metaplasia were addressed. Repeat EGD March 2027 Rectal care instructions  REV in 6 months   HPI    This is a 72 year old female returning for follow-up of GERD, IBS, atrophic gastritis and hemorrhoids.  Her reflux symptoms and dysphagia are under better control on daily pantoprazole although she notes occasional breakthrough symptoms.  She has frequent urgent postprandial diarrhea and has not been taking dicyclomine regularly as she felt it was only for use for pain.  She notes intermittent hemorrhoidal itching.  She has several questions about atrophic gastritis which were addressed   Labs / Imaging       Latest Ref Rng & Units 12/08/2020    8:55 AM 11/12/2020    9:56 AM 08/05/2020   10:00 AM  Hepatic Function  Total Protein 6.0 - 8.5 g/dL 6.5  6.4  6.4   Albumin 3.8 - 4.8 g/dL 3.8     AST 0 - 40 IU/L 20  16  18    ALT 0 - 32 IU/L 13  12  17    Alk Phosphatase 44 - 121 IU/L 77     Total Bilirubin 0.0 - 1.2 mg/dL <8.2  0.3  0.4        Latest Ref Rng & Units 12/08/2020    8:55 AM 08/05/2020   10:00 AM 12/30/2019    2:59 PM  CBC  WBC 3.4 - 10.8 x10E3/uL 4.1  4.5  6.2   Hemoglobin 11.1 - 15.9 g/dL 95.6  21.3  08.6   Hematocrit 34.0 - 46.6 % 37.2  40.1  41.8   Platelets 150 - 450 x10E3/uL 262  344  241    Current Medications, Allergies, Past Medical History, Past Surgical History, Family History and Social History were reviewed in [employer] record.   Physical Exam: General: Well developed, well nourished, no acute distress Head: Normocephalic and atraumatic Eyes:  Sclerae anicteric, EOMI Ears: Normal auditory acuity Mouth: No deformities or lesions noted Lungs: Clear throughout to auscultation Heart: Regular rate and rhythm; No murmurs, rubs or bruits Abdomen: Soft, non tender and non distended. No masses, hepatosplenomegaly or hernias noted. Normal Bowel sounds Rectal: Not done Musculoskeletal: Symmetrical with no gross deformities  Pulses:  Normal pulses noted Extremities: No edema or deformities noted Neurological: Alert oriented x 4, grossly nonfocal Psychological:  Alert and cooperative. Normal mood and affect   Lavanya Roa T. Russella Dar, MD 09/08/2022, 9:18 AM

## 2022-09-13 ENCOUNTER — Other Ambulatory Visit: Payer: Self-pay | Admitting: Rheumatology

## 2022-09-14 NOTE — Telephone Encounter (Signed)
Last Fill: 08/18/2022  Next Visit: 02/09/2023  Last Visit: 08/11/2022  DX: Other insomnia   Current Dose per office note on 08/11/2022: Ambien 5 mg 1 tablet by mouth at bedtime for insomnia.   Okay to refill ambien?

## 2022-10-14 ENCOUNTER — Other Ambulatory Visit: Payer: Self-pay | Admitting: Physician Assistant

## 2022-10-15 NOTE — Telephone Encounter (Signed)
Last Fill: 09/14/2022  Next Visit: 02/09/2023  Last Visit: 08/11/2022  Dx: Other insomnia   Current Dose per office note on 08/11/2022: Ambien 5 mg 1 tablet by mouth at bedtime for insomnia.   Okay to refill Ambien?

## 2022-11-03 ENCOUNTER — Other Ambulatory Visit: Payer: Self-pay | Admitting: Allergy and Immunology

## 2022-11-10 ENCOUNTER — Other Ambulatory Visit: Payer: Self-pay | Admitting: Allergy and Immunology

## 2022-11-17 ENCOUNTER — Other Ambulatory Visit: Payer: Self-pay | Admitting: Rheumatology

## 2022-11-17 NOTE — Telephone Encounter (Signed)
Last Fill: 10/15/2022  Next Visit: 02/09/2023  Last Visit: 08/11/2022  Dx: Other insomnia   Current Dose per office note on 08/11/2022:  Ambien 5 mg 1 tablet by mouth at bedtime for insomnia.   Okay to refill Ambien?

## 2022-12-07 ENCOUNTER — Other Ambulatory Visit: Payer: Self-pay

## 2022-12-07 MED ORDER — ROSUVASTATIN CALCIUM 20 MG PO TABS: ORAL_TABLET | ORAL | 0 refills | Status: DC

## 2022-12-15 ENCOUNTER — Other Ambulatory Visit: Payer: Self-pay | Admitting: Rheumatology

## 2022-12-15 NOTE — Telephone Encounter (Signed)
Last Fill: 11/17/2022   Next Visit: 02/09/2023   Last Visit: 08/11/2022   Dx: Other insomnia    Current Dose per office note on 08/11/2022:  Ambien 5 mg 1 tablet by mouth at bedtime for insomnia.    Okay to refill Ambien?

## 2023-01-12 ENCOUNTER — Encounter: Payer: Self-pay | Admitting: Cardiology

## 2023-01-12 ENCOUNTER — Ambulatory Visit: Payer: Medicare Other | Attending: Cardiology | Admitting: Cardiology

## 2023-01-12 VITALS — BP 146/78 | HR 92 | Ht 64.0 in | Wt 156.8 lb

## 2023-01-12 DIAGNOSIS — E785 Hyperlipidemia, unspecified: Secondary | ICD-10-CM

## 2023-01-12 DIAGNOSIS — I2510 Atherosclerotic heart disease of native coronary artery without angina pectoris: Secondary | ICD-10-CM | POA: Diagnosis not present

## 2023-01-12 DIAGNOSIS — I10 Essential (primary) hypertension: Secondary | ICD-10-CM

## 2023-01-12 NOTE — Patient Instructions (Addendum)
Medication Instructions:  Your physician recommends that you continue on your current medications as directed. Please refer to the Current Medication list given to you today.  *If you need a refill on your cardiac medications before your next appointment, please call your pharmacy*  Lab Work: None ordered today  Testing/Procedures: None ordered today  Follow-Up: At [facility], you and your health needs are our priority.  As part of our continuing mission to provide you with exceptional heart care, we have created designated Provider Care Teams.  These Care Teams include your primary Cardiologist (physician) and Advanced Practice Providers (APPs -  Physician Assistants and Nurse Practitioners) who all work together to provide you with the care you need, when you need it.  Your next appointment:   12 month(s)  The format for your next appointment:   In Person  Provider:   Donato Schultz, MD

## 2023-01-12 NOTE — Progress Notes (Signed)
Cardiology Office Note:  .   Date:  01/12/2023  ID:  Christina Deleon, DOB 30-Dec-1950, MRN [id number] PCP: Georgann Housekeeper, MD  [facility] Providers Cardiologist:  Donato Schultz, MD    History of Present Illness: .   Christina Deleon is a 72 y.o. female Discussed the use of AI scribe software for clinical note transcription with the patient, who gave verbal consent to proceed.  History of Present Illness   Former patient of Dr. Katrinka Blazing, with a history of coronary calcium score, asthma, premature atrial contractions, hypertension, and hyperlipidemia, presents with pain in the left arm and swelling in the left foot for the past month. The arm pain is described as shooting down the arm, lasting for about a day and a half, and was relieved with Tylenol. The patient denies any chest pressure associated with the arm pain but mentions occasional asthma. The patient also reports occasional heart flutters, described as lasting only a second. The patient denies any changes in these symptoms with exertion.      - Mother passed away at 54 years old - very active.   ROS: no syncope  Studies Reviewed: Marland Kitchen   EKG Interpretation Date/Time:  Wednesday January 12 2023 11:26:11 EDT Ventricular Rate:  83 PR Interval:  150 QRS Duration:  80 QT Interval:  384 QTC Calculation: 451 R Axis:   79  Text Interpretation: Normal sinus rhythm with sinus arrhythmia Normal ECG When compared with ECG of 15-Nov-2016 11:04, No significant change since last tracing Confirmed by Donato Schultz ([id number]) on 01/12/2023 11:42:46 AM    LABS LDL: 68 (07/2022) Creatinine: 0.8 (07/2022) Hemoglobin A1c: 6.0 (07/2022) Hemoglobin: 13.1 (07/2022)  RADIOLOGY Coronary calcium score 2021: 187, 85th %  DIAGNOSTIC Echocardiogram: Normal left ventricular function, 65% ejection fraction Holter monitor: Normal sinus rhythm, occasional premature atrial contractions (PACs), no atrial fibrillation or malignant arrhythmias (12/2019)  Risk  Assessment/Calculations:           Physical Exam:   VS:  BP (!) 146/78   Pulse 92   Ht 5\' 4"  (1.626 m)   Wt 156 lb 12.8 oz (71.1 kg)   LMP 07/02/1994   SpO2 99%   BMI 26.91 kg/m    Wt Readings from Last 3 Encounters:  01/12/23 156 lb 12.8 oz (71.1 kg)  09/08/22 155 lb (70.3 kg)  08/17/22 155 lb (70.3 kg)    GEN: Well nourished, well developed in no acute distress NECK: No JVD; No carotid bruits CARDIAC: RRR, no murmurs, rubs, gallops RESPIRATORY:  Clear to auscultation without rales, wheezing or rhonchi  ABDOMEN: Soft, non-tender, non-distended EXTREMITIES:  No edema; No deformity   ASSESSMENT AND PLAN: .    Assessment and Plan    Left arm pain and swelling in foot Pain in left arm and swelling in left foot for the past month. Likely musculoskeletal in origin, not cardiac. No chest pressure or exertional symptoms. -Continue current management. -Dependent edema likely (elevate, stockings) -Contact office if symptoms worsen or new symptoms develop.  Premature atrial contractions (PACs) Reports occasional palpitations, likely PACs based on prior monitoring. No change in frequency or intensity. -No change in management. -Contact office if palpitations become more frequent or bothersome.  Hyperlipidemia LDL 68 as of March 2024, on Crestor 20mg . -Continue Crestor 20mg .  Hypertension No specific issues discussed in this visit. -Continue current management.  Asthma Reports occasional chest tightness, relieved with inhaler use. -Continue current management.  Follow-up -Schedule follow-up appointment in one year. -Reach out to  office 3-4 months prior to desired appointment date to schedule.            Signed, Donato Schultz, MD

## 2023-01-14 ENCOUNTER — Other Ambulatory Visit: Payer: Self-pay | Admitting: Rheumatology

## 2023-01-14 NOTE — Telephone Encounter (Signed)
Last Fill: 12/15/2022   Next Visit: 02/09/2023   Last Visit: 08/11/2022   Dx: Other insomnia    Current Dose per office note on 08/11/2022:  Ambien 5 mg 1 tablet by mouth at bedtime for insomnia.    Okay to refill Ambien?

## 2023-01-20 ENCOUNTER — Encounter: Payer: Self-pay | Admitting: Allergy and Immunology

## 2023-01-20 ENCOUNTER — Ambulatory Visit: Payer: Medicare Other | Admitting: Allergy and Immunology

## 2023-01-20 VITALS — BP 144/78 | HR 88 | Resp 16 | Ht 63.0 in | Wt 156.6 lb

## 2023-01-20 DIAGNOSIS — K219 Gastro-esophageal reflux disease without esophagitis: Secondary | ICD-10-CM

## 2023-01-20 DIAGNOSIS — J3089 Other allergic rhinitis: Secondary | ICD-10-CM

## 2023-01-20 DIAGNOSIS — J4540 Moderate persistent asthma, uncomplicated: Secondary | ICD-10-CM | POA: Diagnosis not present

## 2023-01-20 MED ORDER — FAMOTIDINE 40 MG PO TABS: ORAL_TABLET | ORAL | 1 refills | Status: AC

## 2023-01-20 NOTE — Patient Instructions (Addendum)
  1. Continue to treat reflux/LPR:    A. Famotidine 40 mg - 1-2 times per day  B. Replace throat clearing with swallowing/drinking maneuver  2. Continue to treat and prevent inflammation:   A. Dymista -1 spray each nostril 1-2 times per day  B. montelukast 10mg  -1 time per day  C. Advair 115 HFA - 2 inhalations 2 times per day (empty lungs)  3. If needed:   A. ProAir HFA  B. Zyrtec 10 mg daily   C. Clotrimazole troches - 1 time per day  D. nasal saline E. Nebulizer - Duoneb- every 4-6 hours    4. Return to clinic in 6 months or earlier if problem  5. Consider RSV vaccine

## 2023-01-20 NOTE — Progress Notes (Signed)
[place] - [place] - [place] - [place] - [place]   Follow-up Note  Referring Provider: Georgann Housekeeper, MD Primary Provider: Georgann Housekeeper, MD Date of Office Visit: 01/20/2023  Subjective:   Christina Deleon (DOB: 1951/01/23) is a 72 y.o. female who [facility] in re-evaluation of the following:  HPI: Christina Deleon returns to this clinic in evaluation of asthma, allergic rhinitis, LPR, and history of recurrent thrush.  I last saw Christina Deleon in this clinic 21 July 2022.  Christina Deleon has done pretty well regarding Christina Deleon chronic respiratory tract symptoms although Christina Deleon still has some cough on occasion but it is very manageable at this point in time while Christina Deleon continues to use Christina Deleon inhaled combination agent twice a day and uses some Dymista about 1 time per day.  Christina Deleon has not required a systemic steroid or an antibiotic for any type of airway issue and Christina Deleon rarely uses a short acting bronchodilator averaging out about 1 time per month and Christina Deleon can exert herself without any problem.  Christina Deleon reflux appears to be under pretty good control as is Christina Deleon throat issue.  Christina Deleon did visit with Christina Deleon gastroenterologist and Christina Deleon is now using famotidine just 1 time per day.  Christina Deleon recurrent thrush is under very good control with Christina Deleon clotrimazole therapy.  Christina Deleon has received this years flu vaccine.  Allergies as of 01/20/2023       Reactions   Trazodone Hcl Other (See Comments)   REACTION: bad headaches   Clindamycin/lincomycin Other (See Comments)   headaches   Penicillins Nausea Only, Rash   Patient has taken Amoxicillin without an issue, per patient. Has patient had a PCN reaction causing immediate rash, facial/tongue/throat swelling, SOB or lightheadedness with hypotension: No Has patient had a PCN reaction causing severe rash involving mucus membranes or skin necrosis: No Has patient had a PCN reaction that required hospitalization No Has patient had a PCN reaction occurring within the  last 10 years: No If all of the above answers are "NO", then may proceed with Cephalosporin use.        Medication List    Advair HFA 115-21 MCG/ACT inhaler Generic drug: fluticasone-salmeterol INHALE 2 PUFFS BY MOUTH 1 TO 2 TIMES PER DAY   albuterol 108 (90 Base) MCG/ACT inhaler Commonly known as: VENTOLIN HFA Inhale 2 puffs into the lungs every 6 (six) hours as needed for wheezing or shortness of breath.   ALPRAZolam 0.5 MG tablet Commonly known as: XANAX as needed.   Azelastine-Fluticasone 137-50 MCG/ACT Susp SHAKE LIQUID AND USE ONE SPRAY IN EACH NOSTRIL ONE TO TWO TIMES DAILY.   B-12 COMPLIANCE INJECTION IJ Inject as directed. Once monthly   CALCIUM PO Take 1 tablet by mouth daily.   candesartan 16 MG tablet Commonly known as: ATACAND Take 8 mg by mouth at bedtime.   Carbinoxamine Maleate 4 MG Tabs Can take one to two tablets up to three times daily if needed.   cetirizine 10 MG tablet Commonly known as: ZYRTEC Take 1 tablet (10 mg total) by mouth daily. One tab daily for allergies   clotrimazole 10 MG troche Commonly known as: MYCELEX CAN USE 1 TROCHE IN MOUTH ONCE DAILY AS NEEDED   diclofenac 50 MG tablet Commonly known as: CATAFLAM Take 1 tablet (50 mg total) by mouth 2 (two) times daily.   diclofenac Sodium 1 % Gel Commonly known as: VOLTAREN Apply 2 g topically 4 (four) times daily.   dicyclomine 10 MG capsule Commonly  known as: BENTYL Take 1 capsule (10 mg total) by mouth 3 (three) times daily as needed for spasms.   famotidine 40 MG tablet Commonly known as: Pepcid Take 1 tablet (40 mg total) by mouth at bedtime.   hydrochlorothiazide 12.5 MG tablet Commonly known as: HYDRODIURIL 1 tablet in the morning Orally Once a day for 90 days   ipratropium-albuterol 0.5-2.5 (3) MG/3ML Soln Commonly known as: DUONEB Use one vial in the nebulizer every 4-6 hours if needed for cough or wheeze.   Lifitegrast 5 % Soln Apply 1 drop to eye 2 (two)  times daily.   methocarbamol 500 MG tablet Commonly known as: ROBAXIN TAKE 1 TABLET BY MOUTH DAILY AS NEEDED FOR MUSCLE SPASMS   montelukast 10 MG tablet Commonly known as: SINGULAIR TAKE 1 TABLET(10 MG) BY MOUTH DAILY   Multi-Vitamin tablet Take by mouth.   rosuvastatin 20 MG tablet Commonly known as: CRESTOR TAKE 1 TABLET(20 MG) BY MOUTH DAILY   VITAMIN A PO Take by mouth daily.   VITAMIN C PO Take 1 tablet by mouth daily.   Vitamin D 50 MCG (2000 UT) tablet Take 2,000 Units by mouth daily.   zolmitriptan 5 MG disintegrating tablet Commonly known as: ZOMIG-ZMT Take 1 tablet (5 mg total) by mouth as needed for migraine.   zolpidem 5 MG tablet Commonly known as: AMBIEN TAKE 1 TABLET(5 MG) BY MOUTH AT BEDTIME AS NEEDED FOR SLEEP    Past Medical History:  Diagnosis Date   Adenomatous colon polyp 10/1997, and 03/2009   Allergic rhinitis    Allergy    Aortic atherosclerosis (HCC)    Arthritis    osteo   Asthma    B12 deficiency    Chronic gastritis    Coronary artery calcification seen on CT scan    Fibromyalgia    GERD (gastroesophageal reflux disease)    Hemorrhoids    Hyperlipidemia    Hypertension    Menorrhagia    partial hysterectomy   Migraines    Osteoarthritis    PONV (postoperative nausea and vomiting)    Premature atrial contractions    Sleep disturbance    STD (sexually transmitted disease) 01/22/11 culture proven   HSV type I labia    Past Surgical History:  Procedure Laterality Date   CATARACT EXTRACTION Bilateral 10/16 & 11/16   EXCISION METACARPAL MASS Right 11/16/2016   Procedure: RIGHT LITTLE FINGER CYST EXCISION;  Surgeon: Mack Hook, MD;  Location: [facility];  Service: Orthopedics;  Laterality: Right;   TONSILLECTOMY  age 8   tooth extract     with bone graft   Turbinate sinus surgery  2003   UPPER GI ENDOSCOPY  07/2022   VAGINAL HYSTERECTOMY  1998   secondary to prolapse, ovaries remain    Review of  systems negative except as noted in HPI / PMHx or noted below:  Review of Systems  Constitutional: Negative.   HENT: Negative.    Eyes: Negative.   Respiratory: Negative.    Cardiovascular: Negative.   Gastrointestinal: Negative.   Genitourinary: Negative.   Musculoskeletal: Negative.   Skin: Negative.   Neurological: Negative.   Endo/Heme/Allergies: Negative.   Psychiatric/Behavioral: Negative.       Objective:   Vitals:   01/20/23 1031  BP: (!) 144/78  Pulse: 88  Resp: 16  SpO2: 97%   Height: 5\' 3"  (160 cm)  Weight: 156 lb 9.6 oz (71 kg)   Physical Exam Constitutional:      Appearance: Christina Deleon  is not diaphoretic.     Comments: Raspy voice  HENT:     Head: Normocephalic.     Right Ear: Tympanic membrane, ear canal and external ear normal.     Left Ear: Tympanic membrane, ear canal and external ear normal.     Nose: Nose normal. No mucosal edema or rhinorrhea.     Mouth/Throat:     Pharynx: Uvula midline. No oropharyngeal exudate.  Eyes:     Conjunctiva/sclera: Conjunctivae normal.  Neck:     Thyroid: No thyromegaly.     Trachea: Trachea normal. No tracheal tenderness or tracheal deviation.  Cardiovascular:     Rate and Rhythm: Normal rate and regular rhythm.     Heart sounds: Normal heart sounds, S1 normal and S2 normal. No murmur heard. Pulmonary:     Effort: No respiratory distress.     Breath sounds: Normal breath sounds. No stridor. No wheezing or rales.  Lymphadenopathy:     Head:     Right side of head: No tonsillar adenopathy.     Left side of head: No tonsillar adenopathy.     Cervical: No cervical adenopathy.  Skin:    Findings: No erythema or rash.     Nails: There is no clubbing.  Neurological:     Mental Status: Christina Deleon is alert.    Diagnostics: Spirometry was performed and demonstrated an FEV1 of 1.72 at 97 % of predicted.  Assessment and Plan:   1. Asthma, moderate persistent, well-controlled   2. Other allergic rhinitis   3. LPRD  (laryngopharyngeal reflux disease)     1. Continue to treat reflux/LPR:    A. Famotidine 40 mg - 1-2 times per day  B. Replace throat clearing with swallowing/drinking maneuver  2. Continue to treat and prevent inflammation:   A. Dymista -1 spray each nostril 1-2 times per day  B. montelukast 10mg  -1 time per day  C. Advair 115 HFA - 2 inhalations 2 times per day (empty lungs)  3. If needed:   A. ProAir HFA  B. Zyrtec 10 mg daily   C. Clotrimazole troches - 1 time per day  D. nasal saline E. Nebulizer - Duoneb- every 4-6 hours    4. Return to clinic in 6 months or earlier if problem  5. Consider RSV vaccine   Christina Deleon appears to be doing pretty well with a collection of therapies directed against respiratory tract inflammation and reflux and Christina Deleon will continue on this plan then if Christina Deleon has any difficulty Christina Deleon can contact me but if Christina Deleon does well I will just see Christina Deleon back in this clinic in 6 months or earlier if there is a problem.  Christina Schimke, MD Allergy / Immunology [facility]

## 2023-01-24 ENCOUNTER — Encounter: Payer: Self-pay | Admitting: Allergy and Immunology

## 2023-01-25 NOTE — Addendum Note (Signed)
Addended by: Deborra Medina on: 01/25/2023 05:04 PM   Modules accepted: Orders

## 2023-02-02 ENCOUNTER — Other Ambulatory Visit: Payer: Self-pay | Admitting: Allergy and Immunology

## 2023-02-09 ENCOUNTER — Ambulatory Visit: Payer: Medicare Other | Attending: Rheumatology | Admitting: Rheumatology

## 2023-02-09 ENCOUNTER — Encounter: Payer: Self-pay | Admitting: Rheumatology

## 2023-02-09 ENCOUNTER — Other Ambulatory Visit: Payer: Self-pay | Admitting: Cardiology

## 2023-02-09 VITALS — BP 134/82 | HR 83 | Resp 16 | Ht 64.0 in | Wt 156.8 lb

## 2023-02-09 DIAGNOSIS — M25511 Pain in right shoulder: Secondary | ICD-10-CM

## 2023-02-09 DIAGNOSIS — R5383 Other fatigue: Secondary | ICD-10-CM

## 2023-02-09 DIAGNOSIS — G8929 Other chronic pain: Secondary | ICD-10-CM

## 2023-02-09 DIAGNOSIS — M170 Bilateral primary osteoarthritis of knee: Secondary | ICD-10-CM

## 2023-02-09 DIAGNOSIS — Z8679 Personal history of other diseases of the circulatory system: Secondary | ICD-10-CM

## 2023-02-09 DIAGNOSIS — Z8709 Personal history of other diseases of the respiratory system: Secondary | ICD-10-CM

## 2023-02-09 DIAGNOSIS — M722 Plantar fascial fibromatosis: Secondary | ICD-10-CM

## 2023-02-09 DIAGNOSIS — M797 Fibromyalgia: Secondary | ICD-10-CM

## 2023-02-09 DIAGNOSIS — M47816 Spondylosis without myelopathy or radiculopathy, lumbar region: Secondary | ICD-10-CM

## 2023-02-09 DIAGNOSIS — G4709 Other insomnia: Secondary | ICD-10-CM

## 2023-02-09 DIAGNOSIS — M5030 Other cervical disc degeneration, unspecified cervical region: Secondary | ICD-10-CM | POA: Diagnosis not present

## 2023-02-09 DIAGNOSIS — Z860100 Personal history of colon polyps, unspecified: Secondary | ICD-10-CM

## 2023-02-09 DIAGNOSIS — Z8719 Personal history of other diseases of the digestive system: Secondary | ICD-10-CM

## 2023-02-09 DIAGNOSIS — M7062 Trochanteric bursitis, left hip: Secondary | ICD-10-CM

## 2023-02-09 DIAGNOSIS — Z8639 Personal history of other endocrine, nutritional and metabolic disease: Secondary | ICD-10-CM

## 2023-02-09 DIAGNOSIS — Z8669 Personal history of other diseases of the nervous system and sense organs: Secondary | ICD-10-CM

## 2023-02-09 DIAGNOSIS — M7061 Trochanteric bursitis, right hip: Secondary | ICD-10-CM

## 2023-02-09 DIAGNOSIS — M25512 Pain in left shoulder: Secondary | ICD-10-CM

## 2023-02-09 DIAGNOSIS — M19042 Primary osteoarthritis, left hand: Secondary | ICD-10-CM

## 2023-02-09 DIAGNOSIS — M19041 Primary osteoarthritis, right hand: Secondary | ICD-10-CM

## 2023-02-09 NOTE — Patient Instructions (Signed)
Low Back Sprain or Strain Rehab Ask your health care provider which exercises are safe for you. Do exercises exactly as told by your health care provider and adjust them as directed. It is normal to feel mild stretching, pulling, tightness, or discomfort as you do these exercises. Stop right away if you feel sudden pain or your pain gets worse. Do not begin these exercises until told by your health care provider. Stretching and range-of-motion exercises These exercises warm up your muscles and joints and improve the movement and flexibility of your back. These exercises also help to relieve pain, numbness, and tingling. Lumbar rotation  Lie on your back on a firm bed or the floor with your knees bent. Straighten your arms out to your sides so each arm forms a 90-degree angle (right angle) with a side of your body. Slowly move (rotate) both of your knees to one side of your body until you feel a stretch in your lower back (lumbar). Try not to let your shoulders lift off the floor. Hold this position for __________ seconds. Tense your abdominal muscles and slowly move your knees back to the starting position. Repeat this exercise on the other side of your body. Repeat __________ times. Complete this exercise __________ times a day. Single knee to chest  Lie on your back on a firm bed or the floor with both legs straight. Bend one of your knees. Use your hands to move your knee up toward your chest until you feel a gentle stretch in your lower back and buttock. Hold your leg in this position by holding on to the front of your knee. Keep your other leg as straight as possible. Hold this position for __________ seconds. Slowly return to the starting position. Repeat with your other leg. Repeat __________ times. Complete this exercise __________ times a day. Prone extension on elbows  Lie on your abdomen on a firm bed or the floor (prone position). Prop yourself up on your elbows. Use your arms  to help lift your chest up until you feel a gentle stretch in your abdomen and your lower back. This will place some of your body weight on your elbows. If this is uncomfortable, try stacking pillows under your chest. Your hips should stay down, against the surface that you are lying on. Keep your hip and back muscles relaxed. Hold this position for __________ seconds. Slowly relax your upper body and return to the starting position. Repeat __________ times. Complete this exercise __________ times a day. Strengthening exercises These exercises build strength and endurance in your back. Endurance is the ability to use your muscles for a long time, even after they get tired. Pelvic tilt This exercise strengthens the muscles that lie deep in the abdomen. Lie on your back on a firm bed or the floor with your legs extended. Bend your knees so they are pointing toward the ceiling and your feet are flat on the floor. Tighten your lower abdominal muscles to press your lower back against the floor. This motion will tilt your pelvis so your tailbone points up toward the ceiling instead of pointing to your feet or the floor. To help with this exercise, you may place a small towel under your lower back and try to push your back into the towel. Hold this position for __________ seconds. Let your muscles relax completely before you repeat this exercise. Repeat __________ times. Complete this exercise __________ times a day. Alternating arm and leg raises  Get on your hands  and knees on a firm surface. If you are on a hard floor, you may want to use padding, such as an exercise mat, to cushion your knees. Line up your arms and legs. Your hands should be directly below your shoulders, and your knees should be directly below your hips. Lift your left leg behind you. At the same time, raise your right arm and straighten it in front of you. Do not lift your leg higher than your hip. Do not lift your arm higher  than your shoulder. Keep your abdominal and back muscles tight. Keep your hips facing the ground. Do not arch your back. Keep your balance carefully, and do not hold your breath. Hold this position for __________ seconds. Slowly return to the starting position. Repeat with your right leg and your left arm. Repeat __________ times. Complete this exercise __________ times a day. Abdominal set with straight leg raise  Lie on your back on a firm bed or the floor. Bend one of your knees and keep your other leg straight. Tense your abdominal muscles and lift your straight leg up, 4-6 inches (10-15 cm) off the ground. Keep your abdominal muscles tight and hold this position for __________ seconds. Do not hold your breath. Do not arch your back. Keep it flat against the ground. Keep your abdominal muscles tense as you slowly lower your leg back to the starting position. Repeat with your other leg. Repeat __________ times. Complete this exercise __________ times a day. Single leg lower with bent knees Lie on your back on a firm bed or the floor. Tense your abdominal muscles and lift your feet off the floor, one foot at a time, so your knees and hips are bent in 90-degree angles (right angles). Your knees should be over your hips and your lower legs should be parallel to the floor. Keeping your abdominal muscles tense and your knee bent, slowly lower one of your legs so your toe touches the ground. Lift your leg back up to return to the starting position. Do not hold your breath. Do not let your back arch. Keep your back flat against the ground. Repeat with your other leg. Repeat __________ times. Complete this exercise __________ times a day. Posture and body mechanics Good posture and healthy body mechanics can help to relieve stress in your body's tissues and joints. Body mechanics refers to the movements and positions of your body while you do your daily activities. Posture is part of body  mechanics. Good posture means: Your spine is in its natural S-curve position (neutral). Your shoulders are pulled back slightly. Your head is not tipped forward (neutral). Follow these guidelines to improve your posture and body mechanics in your everyday activities. Standing  When standing, keep your spine neutral and your feet about hip-width apart. Keep a slight bend in your knees. Your ears, shoulders, and hips should line up. When you do a task in which you stand in one place for a long time, place one foot up on a stable object that is 2-4 inches (5-10 cm) high, such as a footstool. This helps keep your spine neutral. Sitting  When sitting, keep your spine neutral and keep your feet flat on the floor. Use a footrest, if necessary, and keep your thighs parallel to the floor. Avoid rounding your shoulders, and avoid tilting your head forward. When working at a desk or a computer, keep your desk at a height where your hands are slightly lower than your elbows. Slide your  chair under your desk so you are close enough to maintain good posture. When working at a computer, place your monitor at a height where you are looking straight ahead and you do not have to tilt your head forward or downward to look at the screen. Resting When lying down and resting, avoid positions that are most painful for you. If you have pain with activities such as sitting, bending, stooping, or squatting, lie in a position in which your body does not bend very much. For example, avoid curling up on your side with your arms and knees near your chest (fetal position). If you have pain with activities such as standing for a long time or reaching with your arms, lie with your spine in a neutral position and bend your knees slightly. Try the following positions: Lying on your side with a pillow between your knees. Lying on your back with a pillow under your knees. Lifting  When lifting objects, keep your feet at least  shoulder-width apart and tighten your abdominal muscles. Bend your knees and hips and keep your spine neutral. It is important to lift using the strength of your legs, not your back. Do not lock your knees straight out. Always ask for help to lift heavy or awkward objects. This information is not intended to replace advice given to you by your health care provider. Make sure you discuss any questions you have with your health care provider. Document Revised: 08/23/2022 Document Reviewed: 07/07/2020 Elsevier Patient Education  2024 [employer].

## 2023-02-09 NOTE — Addendum Note (Signed)
Addended by: Geroge Baseman C on: 02/09/2023 11:36 AM   Modules accepted: Orders

## 2023-02-15 ENCOUNTER — Other Ambulatory Visit: Payer: Self-pay | Admitting: Rheumatology

## 2023-02-15 NOTE — Telephone Encounter (Signed)
Last Fill: 01/14/2023  Next Visit: 08/10/2023  Last Visit: 02/09/2024  Dx: Other insomnia   Current Dose per office note on 02/09/2023: Ambien 5 mg 1 tablet by mouth at bedtime for insomnia.   Okay to refill Ambien?

## 2023-02-24 ENCOUNTER — Other Ambulatory Visit: Payer: Self-pay | Admitting: Allergy and Immunology

## 2023-03-01 ENCOUNTER — Other Ambulatory Visit: Payer: Self-pay | Admitting: Internal Medicine

## 2023-03-01 ENCOUNTER — Ambulatory Visit
Admission: RE | Admit: 2023-03-01 | Discharge: 2023-03-01 | Disposition: A | Discharge status: Home / Self Care | Payer: Medicare Other | Source: Ambulatory Visit | Attending: Internal Medicine | Admitting: Internal Medicine

## 2023-03-01 DIAGNOSIS — M546 Pain in thoracic spine: Secondary | ICD-10-CM

## 2023-03-01 DIAGNOSIS — M5442 Lumbago with sciatica, left side: Secondary | ICD-10-CM

## 2023-03-15 ENCOUNTER — Other Ambulatory Visit: Payer: Self-pay | Admitting: Rheumatology

## 2023-03-15 NOTE — Telephone Encounter (Signed)
Last Fill: 02/15/2023  Next Visit: 08/10/2023  Last Visit: 02/09/2023  Dx: Other insomnia   Current Dose per office note on 02/09/2023: Ambien 5 mg 1 tablet by mouth at bedtime for insomnia.   Okay to refill Ambien?

## 2023-04-07 ENCOUNTER — Telehealth: Payer: Self-pay | Admitting: Cardiology

## 2023-04-07 NOTE — Telephone Encounter (Signed)
*  STAT* If patient is at the pharmacy, call can be transferred to refill team.   1. Which medications need to be refilled? (please list name of each medication and dose if known) rosuvastatin (CRESTOR) 20 MG tablet   2. Which pharmacy/location (including street and city if local pharmacy) is medication to be sent to? [facility] [phone] - [place], [place] - [address]   3. Do they need a 30 day or 90 day supply? 90

## 2023-04-07 NOTE — Telephone Encounter (Signed)
Refill requested too soon.

## 2023-04-11 NOTE — Telephone Encounter (Signed)
Patient stated she wants to get a new 90-day prescription to replace her 30-day prescription sent to [id number] - [place], [place] - [address]  Patient wants call back to confirm prescription sent.

## 2023-04-12 MED ORDER — ROSUVASTATIN CALCIUM 20 MG PO TABS: ORAL_TABLET | ORAL | 2 refills | Status: DC

## 2023-04-12 NOTE — Telephone Encounter (Signed)
*  STAT* If patient is at the pharmacy, call can be transferred to refill team.   1. Which medications need to be refilled? (please list name of each medication and dose if known) rosuvastatin (CRESTOR) 20 MG tablet   2. Which pharmacy/location (including street and city if local pharmacy) is medication to be sent to?  [facility] [phone] - [place], [place] - [address]      3. Do they need a 30 day or 90 day supply? 90 day

## 2023-04-20 ENCOUNTER — Other Ambulatory Visit: Payer: Self-pay | Admitting: Rheumatology

## 2023-04-21 ENCOUNTER — Telehealth: Payer: Self-pay | Admitting: Rheumatology

## 2023-04-21 NOTE — Telephone Encounter (Signed)
Patient contacted the office to request a medication refill.   1. Name of Medication: Ambien  2. How are you currently taking this medication (dosage and times per day)? 1 tablet 5mg    3. What pharmacy would you like for that to be sent to? [facility]- [place]    Pt is going out of town for [employer] and need refill sooner

## 2023-04-21 NOTE — Telephone Encounter (Signed)
Last Fill: 03/15/2023   Next Visit: 08/10/2023   Last Visit: 02/09/2023   Dx: Other insomnia    Current Dose per office note on 02/09/2023: Ambien 5 mg 1 tablet by mouth at bedtime for insomnia.    Okay to refill Ambien?

## 2023-04-21 NOTE — Telephone Encounter (Signed)
See Rx request for details.

## 2023-05-10 ENCOUNTER — Ambulatory Visit: Payer: Medicare Other | Admitting: Neurology

## 2023-05-10 ENCOUNTER — Encounter: Payer: Self-pay | Admitting: Neurology

## 2023-05-10 VITALS — BP 120/72 | HR 93 | Ht 64.0 in | Wt 157.0 lb

## 2023-05-10 DIAGNOSIS — G43009 Migraine without aura, not intractable, without status migrainosus: Secondary | ICD-10-CM

## 2023-05-10 DIAGNOSIS — E538 Deficiency of other specified B group vitamins: Secondary | ICD-10-CM | POA: Diagnosis not present

## 2023-05-10 MED ORDER — ZOLMITRIPTAN 5 MG PO TBDP: 5.0000 mg | ORAL_TABLET | ORAL | 11 refills | Status: DC | PRN

## 2023-05-10 NOTE — Progress Notes (Signed)
 Follow-up Visit   Date: 05/10/23   Christina Deleon MRN: [id number] DOB: Oct 09, 1950   Interim History: Christina Deleon is a 73 y.o. right-handed female with GERD, hypertension, hyperlipidemia, and fibromyalgia returning to the clinic for follow-up of chronic migraine.  The patient was accompanied to the clinic by self.   IMPRESSION/PLAN: Episodic migraine.  MRI/A brain is normal  - Continue zomig  2.5 - 5mg  as needed for severe migraine  Dysesthesias, improved after vitamin B12 supplementation - Continue vitamin B12 1000mcg IM monthly  Return to clinic in 1 year  -------------------------------- UPDATE 05/10/2023:  She is here for follow-up visit.  Overall, her migraines are stable.  She reports having spells of migraine, which occurs 1-2 times per month.  She has benefit with zomig .  She also had dull headaches throughout the month, which tend to be stress induced.  She no longer has numbness/tingling involving the arms and legs. She is compliant with taking vitamin B12 injections.   Medications:  Current Outpatient Medications on File Prior to Visit  Medication Sig Dispense Refill   ADVAIR  HFA 115-21 MCG/ACT inhaler INHALE 2 PUFFS BY MOUTH 1 TO 2 TIMES PER DAY 12 g 1   albuterol  (VENTOLIN  HFA) 108 (90 Base) MCG/ACT inhaler INHALE 2 PUFFS INTO THE LUNGS EVERY 6 HOURS AS NEEDED FOR WHEEZING OR SHORTNESS OF BREATH 6.7 g 1   ALPRAZolam (XANAX) 0.5 MG tablet as needed.     Ascorbic Acid (VITAMIN C PO) Take 1 tablet by mouth daily.     Azelastine -Fluticasone  137-50 MCG/ACT SUSP SHAKE LIQUID AND USE ONE SPRAY IN EACH NOSTRIL ONE TO TWO TIMES DAILY. 23 g 5   CALCIUM  PO Take 1 tablet by mouth daily.     candesartan (ATACAND) 16 MG tablet Take 8 mg by mouth at bedtime.     Carbinoxamine  Maleate 4 MG TABS Can take one to two tablets up to three times daily if needed. 180 tablet 5   cetirizine  (ZYRTEC ) 10 MG tablet Take 1 tablet (10 mg total) by mouth daily. One tab daily for allergies 30  tablet 1   Cholecalciferol (VITAMIN D ) 2000 units tablet Take 2,000 Units by mouth daily.     clotrimazole  (MYCELEX ) 10 MG troche CAN USE 1 TROCHE IN MOUTH ONCE DAILY AS NEEDED 30 Troche 5   Cyanocobalamin (B-12 COMPLIANCE INJECTION IJ) Inject as directed. Once monthly     diclofenac  (CATAFLAM ) 50 MG tablet Take 1 tablet (50 mg total) by mouth 2 (two) times daily. 180 tablet 4   diclofenac  Sodium (VOLTAREN ) 1 % GEL Apply 2 g topically 4 (four) times daily. 350 g 3   dicyclomine  (BENTYL ) 10 MG capsule Take 1 capsule (10 mg total) by mouth 3 (three) times daily as needed for spasms. 90 capsule 11   famotidine  (PEPCID ) 40 MG tablet Can take one tablet one to two times daily as directed. 180 tablet 1   hydrochlorothiazide (HYDRODIURIL) 12.5 MG tablet      ipratropium-albuterol  (DUONEB) 0.5-2.5 (3) MG/3ML SOLN Use one vial in the nebulizer every 4-6 hours if needed for cough or wheeze. 300 mL 1   Lifitegrast 5 % SOLN Apply 1 drop to eye 2 (two) times daily.     methocarbamol  (ROBAXIN ) 500 MG tablet TAKE 1 TABLET BY MOUTH DAILY AS NEEDED FOR MUSCLE SPASMS 30 tablet 2   montelukast  (SINGULAIR ) 10 MG tablet TAKE 1 TABLET(10 MG) BY MOUTH DAILY 90 tablet 1   Multiple Vitamin (MULTI-VITAMIN) tablet Take by mouth.  rosuvastatin  (CRESTOR ) 20 MG tablet TAKE 1 TABLET(20 MG) BY MOUTH DAILY 90 tablet 2   VITAMIN A PO Take by mouth daily.     zolmitriptan  (ZOMIG -ZMT) 5 MG disintegrating tablet Take 1 tablet (5 mg total) by mouth as needed for migraine. 10 tablet 11   zolpidem  (AMBIEN ) 5 MG tablet TAKE 1 TABLET(5 MG) BY MOUTH AT BEDTIME AS NEEDED FOR SLEEP 30 tablet 0   No current facility-administered medications on file prior to visit.    Allergies:  Allergies  Allergen Reactions   Trazodone Hcl Other (See Comments)    REACTION: bad headaches   Clindamycin /Lincomycin Other (See Comments)    headaches   Penicillins Nausea Only and Rash    Patient has taken Amoxicillin  without an issue, per  patient. Has patient had a PCN reaction causing immediate rash, facial/tongue/throat swelling, SOB or lightheadedness with hypotension: No Has patient had a PCN reaction causing severe rash involving mucus membranes or skin necrosis: No Has patient had a PCN reaction that required hospitalization No Has patient had a PCN reaction occurring within the last 10 years: No If all of the above answers are NO, then may proceed with Cephalosporin use.     Vital Signs:  BP 120/72   Pulse 93   Ht 5' 4 (1.626 m)   Wt 157 lb (71.2 kg)   LMP 07/02/1994   SpO2 99%   BMI 26.95 kg/m   Neurological Exam: MENTAL STATUS including orientation to time, place, person, recent and remote memory, attention span and concentration, language, and fund of knowledge is normal.  Speech is not dysarthric.  CRANIAL NERVES:   Pupils round and reactive bilaterally. No ptosis. Extraocular muscles intact. Face is symmetric.  Palate elevates symmetrically.   MOTOR:  Motor strength is 5/5 in all extremities.  No atrophy, fasciculations or abnormal movements.    MSRs:  Reflexes are 2+/4 throughout  SENSATION:  Vibration intact throughout.  COORDINATION/GAIT:   Gait narrow based and stable.   Data:  MRI/A brain 10/01/2020:   Unremarkable MRI and MR angiogram of the brain.  Lab Results  Component Value Date   TSH 3.30 05/07/2022   Lab Results  Component Value Date   VITAMINB12 91 (L) 05/07/2022      Thank you for allowing me to participate in patient's care.  If I can answer any additional questions, I would be pleased to do so.    Sincerely,    Fredick Schlosser K. Tobie, DO

## 2023-05-17 ENCOUNTER — Other Ambulatory Visit: Payer: Self-pay | Admitting: Rheumatology

## 2023-05-17 NOTE — Telephone Encounter (Signed)
 Last Fill: 04/21/2023   Next Visit: 08/10/2023   Last Visit: 02/09/2023   Dx: Other insomnia    Current Dose per office note on 02/09/2023: Ambien 5 mg 1 tablet by mouth at bedtime for insomnia.    Okay to refill Ambien?

## 2023-05-23 ENCOUNTER — Encounter: Payer: Self-pay | Admitting: Obstetrics and Gynecology

## 2023-05-23 ENCOUNTER — Other Ambulatory Visit: Payer: Self-pay | Admitting: Obstetrics and Gynecology

## 2023-05-23 ENCOUNTER — Ambulatory Visit (INDEPENDENT_AMBULATORY_CARE_PROVIDER_SITE_OTHER): Payer: Medicare Other | Admitting: Obstetrics and Gynecology

## 2023-05-23 VITALS — BP 124/72 | HR 87 | Ht 63.5 in | Wt 155.0 lb

## 2023-05-23 DIAGNOSIS — Z01419 Encounter for gynecological examination (general) (routine) without abnormal findings: Secondary | ICD-10-CM | POA: Insufficient documentation

## 2023-05-23 DIAGNOSIS — N819 Female genital prolapse, unspecified: Secondary | ICD-10-CM | POA: Insufficient documentation

## 2023-05-23 DIAGNOSIS — Z9189 Other specified personal risk factors, not elsewhere classified: Secondary | ICD-10-CM | POA: Insufficient documentation

## 2023-05-23 DIAGNOSIS — M8580 Other specified disorders of bone density and structure, unspecified site: Secondary | ICD-10-CM

## 2023-05-23 DIAGNOSIS — B009 Herpesviral infection, unspecified: Secondary | ICD-10-CM

## 2023-05-23 DIAGNOSIS — R339 Retention of urine, unspecified: Secondary | ICD-10-CM

## 2023-05-23 NOTE — Patient Instructions (Signed)
For patients under 50-74yo, I recommend 1200mg  calcium daily and 600IU of vitamin D daily. For patients over 74yo, I recommend 1200mg  calcium daily and 800IU of vitamin D daily.  Health Maintenance, Female Adopting a healthy lifestyle and getting preventive care are important in promoting health and wellness. Ask your health care provider about: The right schedule for you to have regular tests and exams. Things you can do on your own to prevent diseases and keep yourself healthy. What should I know about diet, weight, and exercise? Eat a healthy diet  Eat a diet that includes plenty of vegetables, fruits, low-fat dairy products, and lean protein. Do not eat a lot of foods that are high in solid fats, added sugars, or sodium. Maintain a healthy weight Body mass index (BMI) is used to identify weight problems. It estimates body fat based on height and weight. Your health care provider can help determine your BMI and help you achieve or maintain a healthy weight. Get regular exercise Get regular exercise. This is one of the most important things you can do for your health. Most adults should: Exercise for at least 150 minutes each week. The exercise should increase your heart rate and make you sweat (moderate-intensity exercise). Do strengthening exercises at least twice a week. This is in addition to the moderate-intensity exercise. Spend less time sitting. Even light physical activity can be beneficial. Watch cholesterol and blood lipids Have your blood tested for lipids and cholesterol at 74 years of age, then have this test every 5 years. Have your cholesterol levels checked more often if: Your lipid or cholesterol levels are high. You are older than 73 years of age. You are at high risk for heart disease. What should I know about cancer screening? Depending on your health history and family history, you may need to have cancer screening at various ages. This may include screening  for: Breast cancer. Cervical cancer. Colorectal cancer. Skin cancer. Lung cancer. What should I know about heart disease, diabetes, and high blood pressure? Blood pressure and heart disease High blood pressure causes heart disease and increases the risk of stroke. This is more likely to develop in people who have high blood pressure readings or are overweight. Have your blood pressure checked: Every 3-5 years if you are 83-74 years of age. Every year if you are 4 years old or older. Diabetes Have regular diabetes screenings. This checks your fasting blood sugar level. Have the screening done: Once every three years after age 68 if you are at a normal weight and have a low risk for diabetes. More often and at a younger age if you are overweight or have a high risk for diabetes. What should I know about preventing infection? Hepatitis B If you have a higher risk for hepatitis B, you should be screened for this virus. Talk with your health care provider to find out if you are at risk for hepatitis B infection. Hepatitis C Testing is recommended for: Everyone born from [birth year] through 1966. Anyone with known risk factors for hepatitis C. Sexually transmitted infections (STIs) Get screened for STIs, including gonorrhea and chlamydia, if: You are sexually active and are younger than 74 years of age. You are older than 74 years of age and your health care provider tells you that you are at risk for this type of infection. Your sexual activity has changed since you were last screened, and you are at increased risk for chlamydia or gonorrhea. Ask your health care provider if  you are at risk. Ask your health care provider about whether you are at high risk for HIV. Your health care provider may recommend a prescription medicine to help prevent HIV infection. If you choose to take medicine to prevent HIV, you should first get tested for HIV. You should then be tested every 3 months for as long as you  are taking the medicine. Osteoporosis and menopause Osteoporosis is a disease in which the bones lose minerals and strength with aging. This can result in bone fractures. If you are 44 years old or older, or if you are at risk for osteoporosis and fractures, ask your health care provider if you should: Be screened for bone loss. Take a calcium or vitamin D supplement to lower your risk of fractures. Be given hormone replacement therapy (HRT) to treat symptoms of menopause. Follow these instructions at home: Alcohol use Do not drink alcohol if: Your health care provider tells you not to drink. You are pregnant, may be pregnant, or are planning to become pregnant. If you drink alcohol: Limit how much you have to: 0-1 drink a day. Know how much alcohol is in your drink. In the [place] one drink equals one 12 oz bottle of beer (355 mL), one 5 oz glass of wine (148 mL), or one 1 oz glass of hard liquor (44 mL). Lifestyle Do not use any products that contain nicotine or tobacco. These products include cigarettes, chewing tobacco, and vaping devices, such as e-cigarettes. If you need help quitting, ask your health care provider. Do not use street drugs. Do not share needles. Ask your health care provider for help if you need support or information about quitting drugs. General instructions Schedule regular health, dental, and eye exams. Stay current with your vaccines. Tell your health care provider if: You often feel depressed. You have ever been abused or do not feel safe at home. Summary Adopting a healthy lifestyle and getting preventive care are important in promoting health and wellness. Follow your health care provider's instructions about healthy diet, exercising, and getting tested or screened for diseases. Follow your health care provider's instructions on monitoring your cholesterol and blood pressure. This information is not intended to replace advice given to you by your health  care provider. Make sure you discuss any questions you have with your health care provider. Document Revised: 09/08/2020 Document Reviewed: 09/08/2020 Elsevier Patient Education  2024 [employer].

## 2023-05-23 NOTE — Assessment & Plan Note (Addendum)
Continue vitamin D+Calcium Encouraged weight based exercise DXA records requested, ordered by PCP

## 2023-05-23 NOTE — Progress Notes (Addendum)
73 y.o. [id number] postmenopausal female status post hysterectomy, with grade 2 cystocele and rectocele and osteopenia here for annual exam- medicare low risk. Married.  Retired from [employer].  Pt reports double voiding to completely empty bladder. Pt also has hemorrhoids. Denies constipation.  Reports more soft stool.  Postmenopausal bleeding: none Pelvic discharge or pain: none Breast mass, nipple discharge or skin changes : none Last PAP: No results found for: "DIAGPAP", "HPVHIGH", "ADEQPAP" Last mammogram: 2024 at [facility] Last colonoscopy: 09/17/2020, every 3 year follow-up Last DXA: 2022, normal per patient Sexually active: Yes Exercising: Yes. At home, stationary bike Smoker: No PHQ-9: 9, overall doing well symptoms are not affecting daily life  GYN HISTORY: Prior hysterectomy Pelvic organ prolapse Osteopenia  OB History  Gravida Para Term Preterm AB Living  2 2 2  0 0 2  SAB IAB Ectopic Multiple Live Births  0 0 0 0 2    # Outcome Date GA Lbr Len/2nd Weight Sex Type Anes PTL Lv  2 Term 1975    F Vag-Spont   LIV  1 Term 37    F Vag-Spont   LIV    Past Medical History:  Diagnosis Date   Adenomatous colon polyp 10/1997, and 03/2009   Allergic rhinitis    Allergy    Aortic atherosclerosis (HCC)    Arthritis    osteo   Asthma    B12 deficiency    Chronic gastritis    Coronary artery calcification seen on CT scan    Fibromyalgia    GERD (gastroesophageal reflux disease)    Hemorrhoids    Hyperlipidemia    Hypertension    Menorrhagia    partial hysterectomy   Migraines    Osteoarthritis    PONV (postoperative nausea and vomiting)    Premature atrial contractions    Sleep disturbance    STD (sexually transmitted disease) 01/22/11 culture proven   HSV type I labia    Past Surgical History:  Procedure Laterality Date   CATARACT EXTRACTION Bilateral 10/16 & 11/16   EXCISION METACARPAL MASS Right 11/16/2016   Procedure: RIGHT LITTLE FINGER CYST  EXCISION;  Surgeon: Mack Hook, MD;  Location: [facility];  Service: Orthopedics;  Laterality: Right;   TONSILLECTOMY  age 52   tooth extract     with bone graft   Turbinate sinus surgery  2003   UPPER GI ENDOSCOPY  07/2022   VAGINAL HYSTERECTOMY  1998   secondary to prolapse, ovaries remain    Current Outpatient Medications on File Prior to Visit  Medication Sig Dispense Refill   ADVAIR HFA 115-21 MCG/ACT inhaler INHALE 2 PUFFS BY MOUTH 1 TO 2 TIMES PER DAY 12 g 1   albuterol (VENTOLIN HFA) 108 (90 Base) MCG/ACT inhaler INHALE 2 PUFFS INTO THE LUNGS EVERY 6 HOURS AS NEEDED FOR WHEEZING OR SHORTNESS OF BREATH 6.7 g 1   ALPRAZolam (XANAX) 0.5 MG tablet as needed.     Ascorbic Acid (VITAMIN C PO) Take 1 tablet by mouth daily.     Azelastine-Fluticasone 137-50 MCG/ACT SUSP SHAKE LIQUID AND USE ONE SPRAY IN EACH NOSTRIL ONE TO TWO TIMES DAILY. 23 g 5   CALCIUM PO Take 1 tablet by mouth daily.     candesartan (ATACAND) 16 MG tablet Take 8 mg by mouth at bedtime.     Carbinoxamine Maleate 4 MG TABS Can take one to two tablets up to three times daily if needed. 180 tablet 5   cetirizine (ZYRTEC) 10 MG tablet  Take 1 tablet (10 mg total) by mouth daily. One tab daily for allergies 30 tablet 1   Cholecalciferol (VITAMIN D) 2000 units tablet Take 2,000 Units by mouth daily.     clotrimazole (MYCELEX) 10 MG troche CAN USE 1 TROCHE IN MOUTH ONCE DAILY AS NEEDED 30 Troche 5   Cyanocobalamin (B-12 COMPLIANCE INJECTION IJ) Inject as directed. Once monthly     diclofenac (CATAFLAM) 50 MG tablet Take 1 tablet (50 mg total) by mouth 2 (two) times daily. 180 tablet 4   dicyclomine (BENTYL) 10 MG capsule Take 1 capsule (10 mg total) by mouth 3 (three) times daily as needed for spasms. 90 capsule 11   famotidine (PEPCID) 40 MG tablet Can take one tablet one to two times daily as directed. 180 tablet 1   hydrochlorothiazide (HYDRODIURIL) 12.5 MG tablet      ipratropium-albuterol (DUONEB)  0.5-2.5 (3) MG/3ML SOLN Use one vial in the nebulizer every 4-6 hours if needed for cough or wheeze. 300 mL 1   Lifitegrast 5 % SOLN Apply 1 drop to eye 2 (two) times daily.     methocarbamol (ROBAXIN) 500 MG tablet TAKE 1 TABLET BY MOUTH DAILY AS NEEDED FOR MUSCLE SPASMS 30 tablet 2   montelukast (SINGULAIR) 10 MG tablet TAKE 1 TABLET(10 MG) BY MOUTH DAILY 90 tablet 1   Multiple Vitamin (MULTI-VITAMIN) tablet Take by mouth.     rosuvastatin (CRESTOR) 20 MG tablet TAKE 1 TABLET(20 MG) BY MOUTH DAILY 90 tablet 2   VITAMIN A PO Take by mouth daily.     zolmitriptan (ZOMIG-ZMT) 5 MG disintegrating tablet Take 1 tablet (5 mg total) by mouth as needed for migraine. 10 tablet 11   zolpidem (AMBIEN) 5 MG tablet TAKE 1 TABLET(5 MG) BY MOUTH AT BEDTIME AS NEEDED FOR SLEEP 30 tablet 0   No current facility-administered medications on file prior to visit.    Social History   Socioeconomic History   Marital status: Married    Spouse name: Not on file   Number of children: 2   Years of education: Not on file   Highest education level: Not on file  Occupational History   Occupation: Retired    Comment:  regulatory compliance specialist unemployed sincesince 6/10  Tobacco Use   Smoking status: Never    Passive exposure: Past   Smokeless tobacco: Never   Tobacco comments:    Father growing up  Vaping Use   Vaping status: Never Used  Substance and Sexual Activity   Alcohol use: No    Alcohol/week: 0.0 standard drinks of alcohol   Drug use: Never   Sexual activity: Yes    Partners: Male    Birth control/protection: Surgical    Comment: hysterectomy  Other Topics Concern   Not on file  Social History Narrative   She lives with husband.  They have 2 grown children.   She is retired Psychiatric nurse.   Highest level of education:  2 years of college   Right handed   Regular exercise, diet of fruits, veggies, limited fried foods, limited water       Pulmonary:   Originally  from [place]. Has also lived in [place] & [place]. Previously worked doing Warden/ranger. No pets currently. No bird, mold, or hot tub exposure. Does have a musty smell in their home but it is new. Enjoys reading. 1 indoor plant. Has carpet in her home including in the bedroom. Also has draperies.    Right handed  Social Drivers of Corporate investment banker Strain: Not on file  Food Insecurity: Not on file  Transportation Needs: Not on file  Physical Activity: Not on file  Stress: Not on file  Social Connections: Not on file  Intimate Partner Violence: Not on file    Family History  Problem Relation Age of Onset   Coronary artery disease Mother    Hypertension Mother    Hyperlipidemia Mother    Pulmonary fibrosis Mother    Lung disease Mother    Throat cancer Father    Coronary artery disease Father    Heart attack Father 84   Stroke Father        during procedure   Prostate cancer Brother    Heart disease Brother    Diabetes Brother    Diabetes Paternal Aunt        x 2   Prostate cancer Paternal Uncle    Stroke Maternal Grandmother        or MI   Diabetes Paternal Grandmother    Coronary artery disease Paternal Grandmother    Migraines Daughter    Migraines Daughter    Colon cancer Neg Hx     Allergies  Allergen Reactions   Trazodone Hcl Other (See Comments)    REACTION: bad headaches   Clindamycin/Lincomycin Other (See Comments)    headaches   Penicillins Nausea Only and Rash    Patient has taken Amoxicillin without an issue, per patient. Has patient had a PCN reaction causing immediate rash, facial/tongue/throat swelling, SOB or lightheadedness with hypotension: No Has patient had a PCN reaction causing severe rash involving mucus membranes or skin necrosis: No Has patient had a PCN reaction that required hospitalization No Has patient had a PCN reaction occurring within the last 10 years: No If all of the above answers are "NO", then may proceed  with Cephalosporin use.       PE Today's Vitals   05/23/23 0846  BP: 124/72  Pulse: 87  SpO2: 98%  Weight: 155 lb (70.3 kg)  Height: 5' 3.5" (1.613 m)   Body mass index is 27.03 kg/m.  Physical Exam Vitals reviewed. Exam conducted with a chaperone present.  Constitutional:      General: She is not in acute distress.    Appearance: Normal appearance.  HENT:     Head: Normocephalic and atraumatic.     Nose: Nose normal.  Eyes:     Extraocular Movements: Extraocular movements intact.     Conjunctiva/sclera: Conjunctivae normal.  Neck:     Thyroid: No thyroid mass, thyromegaly or thyroid tenderness.  Pulmonary:     Effort: Pulmonary effort is normal.  Chest:     Chest wall: No mass or tenderness.  Breasts:    Right: Normal. No swelling, mass, nipple discharge or tenderness.     Left: Normal. No swelling, mass, nipple discharge or tenderness.  Abdominal:     General: There is no distension.     Palpations: Abdomen is soft.     Tenderness: There is no abdominal tenderness.  Genitourinary:    General: Normal vulva.     Exam position: Lithotomy position.     Urethra: No prolapse.     Vagina: Prolapsed vaginal walls present. No vaginal discharge or bleeding.     Cervix: No lesion.     Adnexa: Right adnexa normal and left adnexa normal.     Comments: Cervix and uterus absent Grade 2 cystocele, grade 1 rectocele Kegel 0/5 Musculoskeletal:  General: Normal range of motion.     Cervical back: Normal range of motion.  Lymphadenopathy:     Upper Body:     Right upper body: No axillary adenopathy.     Left upper body: No axillary adenopathy.     Lower Body: No right inguinal adenopathy. No left inguinal adenopathy.  Skin:    General: Skin is warm and dry.  Neurological:     General: No focal deficit present.     Mental Status: She is alert.  Psychiatric:        Mood and Affect: Mood normal.        Behavior: Behavior normal.       Assessment and Plan:         Well woman exam with routine gynecological exam Assessment & Plan: Cervical cancer screening performed according to ASCCP guidelines. Encouraged annual mammogram screening Colonoscopy UTD DXA UTD Labs and immunizations with her primary Encouraged safe sexual practices as indicated Encouraged healthy lifestyle practices with diet and exercise For patients over 70yo, I recommend 1200mg  calcium daily and 800IU of vitamin D daily.    Female genital prolapse, unspecified type Assessment & Plan: Noticing double voiding to completely empty Unable to engage pelvic floor on exam Recommend PFPT  Orders: -     Ambulatory referral to Physical Therapy  Osteopenia, unspecified location Assessment & Plan: Continue vitamin D+Calcium Encouraged weight based exercise DXA records requested, ordered by PCP    Urinary retention -     Urinalysis,Complete w/RFL Culture -     Ambulatory referral to Physical Therapy  Rosalyn Gess, MD

## 2023-05-23 NOTE — Assessment & Plan Note (Signed)
 Cervical cancer screening performed according to ASCCP guidelines. Encouraged annual mammogram screening Colonoscopy UTD DXA UTD Labs and immunizations with her primary Encouraged safe sexual practices as indicated Encouraged healthy lifestyle practices with diet and exercise For patients over 74yo, I recommend 1200mg  calcium daily and 800IU of vitamin D daily.

## 2023-05-23 NOTE — Assessment & Plan Note (Signed)
Noticing double voiding to completely empty Unable to engage pelvic floor on exam Recommend PFPT

## 2023-05-24 ENCOUNTER — Other Ambulatory Visit: Payer: Medicare Other

## 2023-05-25 ENCOUNTER — Encounter: Payer: Self-pay | Admitting: Obstetrics and Gynecology

## 2023-05-25 LAB — URINALYSIS, COMPLETE W/RFL CULTURE
Bacteria, UA: NONE SEEN /[HPF]
Bilirubin Urine: NEGATIVE
Glucose, UA: NEGATIVE
Hgb urine dipstick: NEGATIVE
Hyaline Cast: NONE SEEN /[LPF]
Ketones, ur: NEGATIVE
Leukocyte Esterase: NEGATIVE
Nitrites, Initial: NEGATIVE
Protein, ur: NEGATIVE
RBC / HPF: NONE SEEN /[HPF] (ref 0–2)
Specific Gravity, Urine: 1.006 (ref 1.001–1.035)
Squamous Epithelial / HPF: NONE SEEN /[HPF] (ref <=5)
WBC, UA: NONE SEEN /[HPF] (ref 0–5)
pH: 6 (ref 5.0–8.0)

## 2023-05-25 LAB — NO CULTURE INDICATED

## 2023-06-09 ENCOUNTER — Other Ambulatory Visit: Payer: Self-pay

## 2023-06-09 DIAGNOSIS — Z9189 Other specified personal risk factors, not elsewhere classified: Secondary | ICD-10-CM

## 2023-06-09 DIAGNOSIS — M8580 Other specified disorders of bone density and structure, unspecified site: Secondary | ICD-10-CM

## 2023-06-17 ENCOUNTER — Other Ambulatory Visit: Payer: Self-pay | Admitting: Rheumatology

## 2023-06-17 NOTE — Telephone Encounter (Signed)
Last Fill: 05/17/2023   Next Visit: 08/10/2023   Last Visit: 02/09/2023   Dx: Other insomnia    Current Dose per office note on 02/09/2023: Ambien 5 mg 1 tablet by mouth at bedtime for insomnia.    Okay to refill Ambien?

## 2023-07-08 LAB — CBC: A1c: 5.9

## 2023-07-08 LAB — COMPREHENSIVE METABOLIC PANEL WITH GFR: EGFR: 66

## 2023-07-14 ENCOUNTER — Other Ambulatory Visit: Payer: Self-pay | Admitting: Allergy and Immunology

## 2023-07-15 ENCOUNTER — Other Ambulatory Visit: Payer: Self-pay | Admitting: Allergy and Immunology

## 2023-07-18 ENCOUNTER — Telehealth: Payer: Self-pay | Admitting: Gastroenterology

## 2023-07-18 ENCOUNTER — Other Ambulatory Visit: Payer: Self-pay | Admitting: Rheumatology

## 2023-07-18 MED ORDER — DICYCLOMINE HCL 10 MG PO CAPS: 10.0000 mg | ORAL_CAPSULE | Freq: Three times a day (TID) | ORAL | 4 refills | Status: AC | PRN

## 2023-07-18 NOTE — Telephone Encounter (Signed)
 Patient is requesting refill of dicyclomine. Previous Dr. Russella Dar patient. You are DOD Dr. Marina Goodell, may I refill medication?

## 2023-07-18 NOTE — Telephone Encounter (Signed)
 Okay to refill multiple times

## 2023-07-18 NOTE — Telephone Encounter (Signed)
 Inbound call from patient requesting a refill for dicyclomine medication. Please advise, thank you.

## 2023-07-18 NOTE — Telephone Encounter (Signed)
 Last Fill: 06/17/2023  Next Visit: 08/10/2023  Last Visit: 02/09/2024  Dx: Other insomnia   Current Dose per office note on 10/92024: Ambien 5 mg 1 tablet by mouth at bedtime for insomnia   Okay to refill Ambien?

## 2023-07-18 NOTE — Telephone Encounter (Signed)
 Prescription sent to patient's pharmacy.

## 2023-07-20 ENCOUNTER — Encounter: Payer: Self-pay | Admitting: Allergy and Immunology

## 2023-07-20 ENCOUNTER — Ambulatory Visit: Payer: Medicare Other | Admitting: Allergy and Immunology

## 2023-07-20 VITALS — BP 118/72 | HR 80 | Resp 18 | Ht 63.2 in | Wt 154.6 lb

## 2023-07-20 DIAGNOSIS — K219 Gastro-esophageal reflux disease without esophagitis: Secondary | ICD-10-CM | POA: Diagnosis not present

## 2023-07-20 DIAGNOSIS — J4540 Moderate persistent asthma, uncomplicated: Secondary | ICD-10-CM

## 2023-07-20 DIAGNOSIS — J3089 Other allergic rhinitis: Secondary | ICD-10-CM

## 2023-07-20 MED ORDER — AZELASTINE-FLUTICASONE 137-50 MCG/ACT NA SUSP: NASAL | 5 refills | Status: AC

## 2023-07-20 NOTE — Patient Instructions (Addendum)
  1. Continue to treat reflux/LPR:    A. Famotidine 40 mg - 1-2 times per day  B. Replace throat clearing with swallowing/drinking maneuver  2. Continue to treat and prevent inflammation:   A. Dymista -1 spray each nostril 1-2 times per day  B. montelukast 10mg  -1 time per day  C. Advair 115 HFA - 2 inhalations 1-2 times per day (empty lungs)  3. If needed:   A. ProAir HFA  B. Zyrtec 10 mg daily   C. Clotrimazole troches - 1 time per day  D. nasal saline E. Nebulizer - Duoneb- every 4-6 hours    4. Return to clinic in 6 months or earlier if problem  5. Influenza = Tamiflu. Covid = Paxlovid

## 2023-07-20 NOTE — Progress Notes (Unsigned)
 [place] [facility] [place] [facility] [place] [facility] [place] [facility] [place]   Follow-up Note  Referring Provider: Georgann Housekeeper, MD Primary Provider: Georgann Housekeeper, MD Date of Office Visit: 07/20/2023  Subjective:   Christina Deleon (DOB: [birth year]) is a 73 y.o. female who [facility] in re-evaluation of the following:  HPI: Christina Deleon returns to this clinic in evaluation of asthma, allergic rhinitis, LPR, history of recurrent thrush.  I last saw her in this clinic 20 January 2023.  She continues to do very well with her respiratory tract issues which includes a combination of asthma, rhinitis, and LPR.  She rarely needs to use the short acting bronchodilator and she has not required a systemic steroid or an antibiotic for any type of airway issue.  She does not have any classic reflux symptoms at this point in time although she still has a little bit of throat clearing and mucus stuck in her throat on occasion.  She has been using her famotidine mostly once a day and her Dymista mostly once a day and her Advair mostly once a day.  Her issue of recurrent thrush is under very good control while intermittently using some clotrimazole.  Allergies as of 07/20/2023       Reactions   Trazodone Hcl Other (See Comments)   REACTION: bad headaches   Clindamycin/lincomycin Other (See Comments)   headaches   Penicillins Nausea Only, Rash   Patient has taken Amoxicillin without an issue, per patient. Has patient had a PCN reaction causing immediate rash, facial/tongue/throat swelling, SOB or lightheadedness with hypotension: No Has patient had a PCN reaction causing severe rash involving mucus membranes or skin necrosis: No Has patient had a PCN reaction that required hospitalization No Has patient had a PCN reaction occurring within the last 10 years: No If all of the above answers are "NO", then may proceed with Cephalosporin use.        Medication List    Advair  HFA 115-21 MCG/ACT inhaler Generic drug: fluticasone-salmeterol INHALE 2 PUFFS BY MOUTH 1 TO 2 TIMES PER DAY   albuterol 108 (90 Base) MCG/ACT inhaler Commonly known as: VENTOLIN HFA INHALE 2 PUFFS INTO THE LUNGS EVERY 6 HOURS AS NEEDED FOR WHEEZING OR SHORTNESS OF BREATH   ALPRAZolam 0.5 MG tablet Commonly known as: XANAX as needed.   Azelastine-Fluticasone 137-50 MCG/ACT Susp SHAKE LIQUID AND USE ONE SPRAY IN EACH NOSTRIL ONE TO TWO TIMES DAILY.   B-12 COMPLIANCE INJECTION IJ Inject as directed. Once monthly   CALCIUM PO Take 1 tablet by mouth daily.   candesartan 16 MG tablet Commonly known as: ATACAND Take 8 mg by mouth at bedtime.   Carbinoxamine Maleate 4 MG Tabs Can take one to two tablets up to three times daily if needed.   cetirizine 10 MG tablet Commonly known as: ZYRTEC Take 1 tablet (10 mg total) by mouth daily. One tab daily for allergies   clotrimazole 10 MG troche Commonly known as: MYCELEX CAN USE 1 TROCHE IN MOUTH ONCE DAILY AS NEEDED   diclofenac 50 MG tablet Commonly known as: CATAFLAM Take 1 tablet (50 mg total) by mouth 2 (two) times daily.   dicyclomine 10 MG capsule Commonly known as: BENTYL Take 1 capsule (10 mg total) by mouth 3 (three) times daily as needed for spasms.   famotidine 40 MG tablet Commonly known as: Pepcid Can take one tablet one to two times daily as directed.   hydrochlorothiazide 12.5 MG  tablet Commonly known as: HYDRODIURIL   ipratropium-albuterol 0.5-2.5 (3) MG/3ML Soln Commonly known as: DUONEB Use one vial in the nebulizer every 4-6 hours if needed for cough or wheeze.   Lifitegrast 5 % Soln Apply 1 drop to eye 2 (two) times daily.   methocarbamol 500 MG tablet Commonly known as: ROBAXIN TAKE 1 TABLET BY MOUTH DAILY AS NEEDED FOR MUSCLE SPASMS   montelukast 10 MG tablet Commonly known as: SINGULAIR TAKE 1 TABLET(10 MG) BY MOUTH DAILY   Multi-Vitamin tablet Take by mouth.   rosuvastatin 20 MG  tablet Commonly known as: CRESTOR TAKE 1 TABLET(20 MG) BY MOUTH DAILY   VITAMIN A PO Take by mouth daily.   VITAMIN C PO Take 1 tablet by mouth daily.   Vitamin D 50 MCG (2000 UT) tablet Take 2,000 Units by mouth daily.   zolmitriptan 5 MG disintegrating tablet Commonly known as: ZOMIG-ZMT Take 1 tablet (5 mg total) by mouth as needed for migraine.   zolpidem 5 MG tablet Commonly known as: AMBIEN TAKE 1 TABLET(5 MG) BY MOUTH AT BEDTIME AS NEEDED FOR SLEEP    Past Medical History:  Diagnosis Date   Adenomatous colon polyp 10/1997, and 03/2009   Allergic rhinitis    Allergy    Aortic atherosclerosis (HCC)    Arthritis    osteo   Asthma    B12 deficiency    Chronic gastritis    Coronary artery calcification seen on CT scan    Fibromyalgia    GERD (gastroesophageal reflux disease)    Hemorrhoids    Hyperlipidemia    Hypertension    Menorrhagia    partial hysterectomy   Migraines    Osteoarthritis    PONV (postoperative nausea and vomiting)    Premature atrial contractions    Sleep disturbance    STD (sexually transmitted disease) 01/22/11 culture proven   HSV type I labia    Past Surgical History:  Procedure Laterality Date   CATARACT EXTRACTION Bilateral 10/16 & 11/16   EXCISION METACARPAL MASS Right 11/16/2016   Procedure: RIGHT LITTLE FINGER CYST EXCISION;  Surgeon: Mack Hook, MD;  Location: [facility];  Service: Orthopedics;  Laterality: Right;   TONSILLECTOMY  age 57   tooth extract     with bone graft   Turbinate sinus surgery  2003   UPPER GI ENDOSCOPY  07/2022   VAGINAL HYSTERECTOMY  1998   secondary to prolapse, ovaries remain    Review of systems negative except as noted in HPI / PMHx or noted below:  Review of Systems  Constitutional: Negative.   HENT: Negative.    Eyes: Negative.   Respiratory: Negative.    Cardiovascular: Negative.   Gastrointestinal: Negative.   Genitourinary: Negative.   Musculoskeletal: Negative.    Skin: Negative.   Neurological: Negative.   Endo/Heme/Allergies: Negative.   Psychiatric/Behavioral: Negative.       Objective:   Vitals:   07/20/23 1106  BP: 118/72  Pulse: 80  Resp: 18  SpO2: 98%   Height: 5' 3.2" (160.5 cm)  Weight: 154 lb 9.6 oz (70.1 kg)   Physical Exam Constitutional:      Appearance: She is not diaphoretic.  HENT:     Head: Normocephalic.     Right Ear: Tympanic membrane, ear canal and external ear normal.     Left Ear: Tympanic membrane, ear canal and external ear normal.     Nose: Nose normal. No mucosal edema or rhinorrhea.     Mouth/Throat:  Pharynx: Uvula midline. No oropharyngeal exudate.  Eyes:     Conjunctiva/sclera: Conjunctivae normal.  Neck:     Thyroid: No thyromegaly.     Trachea: Trachea normal. No tracheal tenderness or tracheal deviation.  Cardiovascular:     Rate and Rhythm: Normal rate and regular rhythm.     Heart sounds: Normal heart sounds, S1 normal and S2 normal. No murmur heard. Pulmonary:     Effort: No respiratory distress.     Breath sounds: Normal breath sounds. No stridor. No wheezing or rales.  Lymphadenopathy:     Head:     Right side of head: No tonsillar adenopathy.     Left side of head: No tonsillar adenopathy.     Cervical: No cervical adenopathy.  Skin:    Findings: No erythema or rash.     Nails: There is no clubbing.  Neurological:     Mental Status: She is alert.     Diagnostics: Spirometry was performed and demonstrated an FEV1 of 1.72 at 98 % of predicted.  Assessment and Plan:   1. Asthma, moderate persistent, well-controlled   2. Other allergic rhinitis   3. LPRD (laryngopharyngeal reflux disease)    1. Continue to treat reflux/LPR:    A. Famotidine 40 mg - 1-2 times per day  B. Replace throat clearing with swallowing/drinking maneuver  2. Continue to treat and prevent inflammation:   A. Dymista -1 spray each nostril 1-2 times per day  B. montelukast 10mg  -1 time per day  C.  Advair 115 HFA - 2 inhalations 1-2 times per day (empty lungs)  3. If needed:   A. ProAir HFA  B. Zyrtec 10 mg daily   C. Clotrimazole troches - 1 time per day  D. nasal saline E. Nebulizer - Duoneb- every 4-6 hours    4. Return to clinic in 6 months or earlier if problem  5. Influenza = Tamiflu. Covid = Paxlovid   Overall, Christina Deleon appears to be doing pretty well regarding her multifactorial form of respiratory tract symptomatology on her current plan of addressing respiratory tract inflammation and reflux induced respiratory disease.  Assuming she does well with the plan noted above we will see her back in this clinic in 6 months or earlier if there is a problem.   Laurette Schimke, MD Allergy / Immunology [facility]

## 2023-07-21 ENCOUNTER — Encounter: Payer: Self-pay | Admitting: Allergy and Immunology

## 2023-07-27 NOTE — Progress Notes (Unsigned)
 Office Visit Note  Patient: Christina Deleon             Date of Birth: [birth year]           MRN: [id number]             PCP: Georgann Housekeeper, MD Referring: Georgann Housekeeper, MD Visit Date: 08/10/2023 Occupation: @GUAROCC @  Subjective:  Left wrist and forearm pain:  History of Present Illness: Christina Deleon is a 73 y.o. female with history of fibromyalgia, osteoarthritis and DDD.  Patient states that she was experiencing increased myalgias and arthralgias through the winter months.  Patient states that she has consistently been experiencing discomfort in the left wrist and forearm.  She denies any injury prior to the onset of symptoms.  She is right-hand dominant.  Patient states that she has noticed swelling in her left wrist as well as radiating pain in the left forearm to her elbow.  She denies any numbness or tingling in the left arm. She takes diclofenac very sparingly for symptomatic relief as well as methocarbamol half tablet as needed.  Patient continues to have ongoing discomfort in both knee joints.  She has difficulty standing or walking for long periods of time.  Patient would like to renew her handicap placard.   Activities of Daily Living:  Patient reports morning stiffness for 1 hour.   Patient Reports nocturnal pain.  Difficulty dressing/grooming: Denies Difficulty climbing stairs: Reports Difficulty getting out of chair: Reports Difficulty using hands for taps, buttons, cutlery, and/or writing: Reports  Review of Systems  Constitutional:  Positive for fatigue.  HENT:  Positive for mouth dryness. Negative for mouth sores.   Eyes:  Positive for dryness.  Respiratory:  Positive for shortness of breath.   Cardiovascular:  Negative for chest pain and palpitations.  Gastrointestinal:  Negative for blood in stool, constipation and diarrhea.  Endocrine: Negative for increased urination.  Genitourinary:  Negative for involuntary urination.  Musculoskeletal:  Positive for joint  pain, joint pain, joint swelling, myalgias, muscle weakness, morning stiffness, muscle tenderness and myalgias. Negative for gait problem.  Skin:  Positive for rash. Negative for color change, hair loss and sensitivity to sunlight.  Allergic/Immunologic: Negative for susceptible to infections.  Neurological:  Positive for dizziness and headaches.  Hematological:  Negative for swollen glands.  Psychiatric/Behavioral:  Positive for depressed mood and sleep disturbance. The patient is nervous/anxious.     PMFS History:  Patient Active Problem List   Diagnosis Date Noted   Well woman exam with routine gynecological exam 05/23/2023   Female genital prolapse 05/23/2023   Fracture Risk Assessment Score (FRAX) indicating greater than 3% risk for hip fracture 05/23/2023   Anxiety 05/21/2021   Dyslipidemia 05/21/2021   Dysphagia 05/21/2021   External hemorrhoids without complication 05/21/2021   Generalized anxiety disorder 05/21/2021   Hardening of the aorta (main artery of the heart) (HCC) 05/21/2021   History of hysterectomy 05/21/2021   Thyroid nodule 05/21/2021   Kidney stone 05/21/2021   Multinodular goiter 05/21/2021   Osteopenia 05/21/2021   Pure hypercholesterolemia 05/21/2021   Sciatica 05/21/2021   Stress due to family tension 05/21/2021   Vitamin D deficiency 05/21/2021   Cervical disc disease 05/21/2021   Unspecified thoracic, thoracolumbar and lumbosacral intervertebral disc disorder 05/21/2021   Dysosmia 12/18/2019   Cough variant asthma vs uacs  04/13/2018   Fibromyalgia 04/19/2016   Other fatigue 04/19/2016   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   Primary osteoarthritis of both knees 04/19/2016  Leg pain 01/22/2016   Restless legs syndrome 01/22/2016   Snoring 11/19/2015   Periodic limb movement sleep disorder 11/19/2015   GERD (gastroesophageal reflux disease) 10/08/2015   Mild persistent asthma 04/09/2015   LPRD (laryngopharyngeal reflux disease) 04/09/2015   Allergic  rhinoconjunctivitis 04/09/2015   HEMATOCHEZIA 04/01/2010   COUGH 04/01/2010   Gastritis and gastroduodenitis 09/30/2009   NAUSEA 09/30/2009   ABDOMINAL PAIN-EPIGASTRIC 09/30/2009   DYSTHYMIC DISORDER 09/26/2009   PALPITATIONS 06/17/2009   Impingement syndrome of shoulder, left 06/04/2009   Constipation 02/05/2009   History of colonic polyps 02/05/2009   Asthma 02/04/2009   CHEST PAIN, ATYPICAL 02/04/2009   COLONIC POLYPS, BENIGN 12/18/2008   Hyperlipidemia LDL goal <70 12/18/2008   Insomnia 12/18/2008   Migraine without aura 12/18/2008   Essential hypertension 12/18/2008   Allergic rhinitis 12/18/2008   Primary osteoarthritis of both hands 12/18/2008   DJD (degenerative joint disease), cervical 12/18/2008   NECK PAIN, CHRONIC 12/18/2008   LOW BACK PAIN, CHRONIC 12/18/2008    Past Medical History:  Diagnosis Date   Adenomatous colon polyp 10/1997, and 03/2009   Allergic rhinitis    Allergy    Aortic atherosclerosis (HCC)    Arthritis    osteo   Asthma    B12 deficiency    Chronic gastritis    Coronary artery calcification seen on CT scan    Fibromyalgia    GERD (gastroesophageal reflux disease)    Hemorrhoids    Hyperlipidemia    Hypertension    Menorrhagia    partial hysterectomy   Migraines    Osteoarthritis    PONV (postoperative nausea and vomiting)    Premature atrial contractions    Sleep disturbance    STD (sexually transmitted disease) 01/22/11 culture proven   HSV type I labia    Family History  Problem Relation Age of Onset   Coronary artery disease Mother    Hypertension Mother    Hyperlipidemia Mother    Pulmonary fibrosis Mother    Lung disease Mother    Throat cancer Father    Coronary artery disease Father    Heart attack Father 67   Stroke Father        during procedure   Prostate cancer Brother    Heart disease Brother    Diabetes Brother    Diabetes Paternal Aunt        x 2   Prostate cancer Paternal Uncle    Stroke Maternal  Grandmother        or MI   Diabetes Paternal Grandmother    Coronary artery disease Paternal Grandmother    Migraines Daughter    Migraines Daughter    Colon cancer Neg Hx    Past Surgical History:  Procedure Laterality Date   CATARACT EXTRACTION Bilateral 10/16 & 11/16   EXCISION METACARPAL MASS Right 11/16/2016   Procedure: RIGHT LITTLE FINGER CYST EXCISION;  Surgeon: Mack Hook, MD;  Location: [facility];  Service: Orthopedics;  Laterality: Right;   TONSILLECTOMY  age 11   tooth extract     with bone graft   Turbinate sinus surgery  2003   UPPER GI ENDOSCOPY  07/2022   VAGINAL HYSTERECTOMY  1998   secondary to prolapse, ovaries remain   Social History   Social History Narrative   She lives with husband.  They have 2 grown children.   She is retired Psychiatric nurse.   Highest level of education:  2 years of college   Right handed  Regular exercise, diet of fruits, veggies, limited fried foods, limited water      [facility]:   Originally from [place]. Has also lived in [place] & [place]. Previously worked doing Warden/ranger. No pets currently. No bird, mold, or hot tub exposure. Does have a musty smell in their home but it is new. Enjoys reading. 1 indoor plant. Has carpet in her home including in the bedroom. Also has draperies.    Right handed   Immunization History  Administered Date(s) Administered   Influenza Split 04/02/2014   Influenza, Seasonal, Injecte, Preservative Fre 04/09/2015   Influenza,inj,Quad PF,6+ Mos 01/14/2016, 03/04/2017, 01/24/2018   Influenza-Unspecified 02/08/2019, 02/25/2020, 02/17/2021   PFIZER(Purple Top)SARS-COV-2 Vaccination 06/23/2019, 07/18/2019, 04/04/2020   Pneumococcal Conjugate-13 11/01/2015, 11/25/2015   Pneumococcal Polysaccharide-23 11/20/2013   Tdap 11/20/2009     Objective: Vital Signs: BP 127/78 (BP Location: Left Arm, Patient Position: Sitting, Cuff Size: Normal)   Pulse 87   Resp  16   Ht 5\' 4"  (1.626 m)   Wt 155 lb (70.3 kg)   LMP 07/02/1994   BMI 26.61 kg/m    Physical Exam Vitals and nursing note reviewed.  Constitutional:      Appearance: She is well-developed.  HENT:     Head: Normocephalic and atraumatic.  Eyes:     Conjunctiva/sclera: Conjunctivae normal.  Cardiovascular:     Rate and Rhythm: Normal rate and regular rhythm.     Heart sounds: Normal heart sounds.  Pulmonary:     Effort: Pulmonary effort is normal.     Breath sounds: Normal breath sounds.  Abdominal:     General: Bowel sounds are normal.     Palpations: Abdomen is soft.  Musculoskeletal:     Cervical back: Normal range of motion.  Lymphadenopathy:     Cervical: No cervical adenopathy.  Skin:    General: Skin is warm and dry.     Capillary Refill: Capillary refill takes less than 2 seconds.  Neurological:     Mental Status: She is alert and oriented to person, place, and time.  Psychiatric:        Behavior: Behavior normal.      Musculoskeletal Exam: Generalized hyperalgesia and positive tender points on exam.  C-spine has slightly limited ROM with some discomfort and stiffness.  Shoulder joints, elbow joints, wrist joints MCPs, PIPs, DIPs have good range of motion with no synovitis.  Tenderness over the lateral epicondyles of the left elbow.  Tenderness of the left wrist but no synovitis noted.  No tenderness or synovitis over MCP joints.  DIP prominence noted.  Complete fist formation bilaterally.  Hip joints have good range of motion with no groin pain.  Knee joints have good range of motion with no warmth or effusion.  Ankle joints have good range of motion without tenderness or joint swelling.    CDAI Exam: CDAI Score: -- Patient Global: --; Provider Global: -- Swollen: --; Tender: -- Joint Exam 08/10/2023   No joint exam has been documented for this visit   There is currently no information documented on the homunculus. Go to the Rheumatology activity and complete the  homunculus joint exam.  Investigation: No additional findings.  Imaging: No results found.  Recent Labs: Lab Results  Component Value Date   WBC 4.1 12/08/2020   HGB 12.5 12/08/2020   PLT 262 12/08/2020   NA 144 12/08/2020   K 4.1 12/08/2020   CL 107 (H) 12/08/2020   CO2 25 12/08/2020   GLUCOSE 101 (  H) 12/08/2020   BUN 15 12/08/2020   CREATININE 0.92 12/08/2020   BILITOT <0.2 12/08/2020   ALKPHOS 77 12/08/2020   AST 20 12/08/2020   ALT 13 12/08/2020   PROT 6.5 12/08/2020   ALBUMIN 3.8 12/08/2020   CALCIUM 9.2 12/08/2020   GFRAA 87 08/05/2020    Speciality Comments: No specialty comments available.  Procedures:  No procedures performed Allergies: Trazodone hcl, Clindamycin/lincomycin, and Penicillins   Assessment / Plan:     Visit Diagnoses: Fibromyalgia: She has generalized hyperalgesia and positive tender points on exam.  Patient was experiencing more frequent and severe flares through the winter months.  She takes methocarbamol sparingly for muscle spasm relief.  She also remains on Ambien 5 mg 1 tablet at bedtime for insomnia. Discussed the importance of regular exercise and good sleep hygiene.  She started physical therapy for pelvic floor strengthening yesterday and will be going twice a week. She presents today with increased discomfort in the left wrist and forearm.  She has tenderness over the lateral epicondyle of the left elbow consistent with tennis elbow.  X-rays of the left wrist were also obtained for further evaluation as well as the following lab work.  Discussed the list of natural anti-inflammatories which she can start taking.  She has been using a brace as needed.  Other fatigue: Chronic, stable.  Discussed the importance of regular exercise and good sleep hygiene.  Other insomnia -She takes Ambien 5 mg 1 tablet by mouth at bedtime for insomnia.  DDD (degenerative disc disease), cervical -She has slightly limited ROM with mild discomfort and stiffness  of the cervical spine. She takes Diclofenac 50 mg 1 tablet twice daily as needed for pain relief. Previously under the care of Dr. Ernestine Mcmurray.  Discussed that she can establish care with Dr. Christell Constant if she develops any new or worsening symptoms.   DDD lumbar spine: X-rays of the lumbar spine on 03/01/2023 revealed grade 1 anterolisthesis of L5-S1 with slight loss of disc space height.  Facet hypertrophy in the lower lumbar spine noted.  Chronic pain of both shoulders: She has good ROM of both shoulders with some discomfort and stiffness.  Pain in left wrist -Patient presents today with ongoing pain in the left wrist.  She has not had any injury or fall prior to the onset of symptoms.  She experiences discomfort with range of motion as well as difficulty pushing off with the left wrist.  She has not had any paresthesias in the left hand but has had radiating pain from her left elbow through the forearm. On examination she has tenderness over the left wrist.  Some tenderness on the volar aspect of the left forearm.  Tenderness over the left lateral epicondyle consistent with tennis elbow was noted.  No obvious synovitis was noted.  X-rays of the left wrist were obtained today for further evaluation.  The following lab work was also obtained.  Given treatment options were discussed if symptoms were consistent with osteoarthritic changes-including the use of a brace, Voltaren gel, and natural anti-inflammatories.  Plan: XR Wrist Complete Left, Sedimentation rate, Rheumatoid factor, Cyclic citrul peptide antibody, IgG, C-reactive protein, Mutated Citrullinated Vimentin (MCV) Antibody  Primary osteoarthritis of both hands: DIP prominence and thickening noted consistent with osteoarthritis.  No tenderness or synovitis over MCP joints.  Complete fist formation noted.  She has tenderness over the left wrist today with some pain extending into the left forearm especially on the volar aspect.  X-rays of the left wrist  were obtained and she will be notified with results.  Trochanteric bursitis of both hips: She has good ROM of both hips with no groin pain.  She started PT yesterday for pelvic floor strengthening.   Primary osteoarthritis of both knees: Patient has chronic pain in both knees joints which is exacerbated by standing or walking prolonged distances.  On examination she has good range of motion of both knee joints with no warmth or effusion.  Paperwork for a renewed handicap placard was provided to the patient today.  She was also given a list of natural anti-inflammatories to take.  Other medical conditions are listed as follows:  History of hypertension: Blood pressure was 127/78 today in the office.  History of gastroesophageal reflux (GERD)  History of asthma  History of hyperlipidemia  History of colon polyps  History of migraine  B12 deficiency   Orders: Orders Placed This Encounter  Procedures   XR Wrist Complete Left   Sedimentation rate   Rheumatoid factor   Cyclic citrul peptide antibody, IgG   C-reactive protein   Mutated Citrullinated Vimentin (MCV) Antibody   No orders of the defined types were placed in this encounter.     Follow-Up Instructions: Return in about 6 months (around 02/09/2024) for Fibromyalgia, Osteoarthritis.   Gearldine Bienenstock, PA-C  Note - This record has been created using Dragon software.  Chart creation errors have been sought, but may not always  have been located. Such creation errors do not reflect on  the standard of medical care.

## 2023-08-09 ENCOUNTER — Other Ambulatory Visit: Payer: Self-pay

## 2023-08-09 ENCOUNTER — Ambulatory Visit: Payer: Medicare Other | Attending: Obstetrics and Gynecology | Admitting: Physical Therapy

## 2023-08-09 ENCOUNTER — Encounter: Payer: Self-pay | Admitting: Physical Therapy

## 2023-08-09 DIAGNOSIS — R279 Unspecified lack of coordination: Secondary | ICD-10-CM | POA: Diagnosis present

## 2023-08-09 DIAGNOSIS — R293 Abnormal posture: Secondary | ICD-10-CM | POA: Diagnosis present

## 2023-08-09 DIAGNOSIS — N819 Female genital prolapse, unspecified: Secondary | ICD-10-CM | POA: Diagnosis not present

## 2023-08-09 DIAGNOSIS — M62838 Other muscle spasm: Secondary | ICD-10-CM | POA: Diagnosis present

## 2023-08-09 DIAGNOSIS — M6281 Muscle weakness (generalized): Secondary | ICD-10-CM | POA: Insufficient documentation

## 2023-08-09 DIAGNOSIS — R339 Retention of urine, unspecified: Secondary | ICD-10-CM | POA: Insufficient documentation

## 2023-08-09 NOTE — Therapy (Signed)
 OUTPATIENT PHYSICAL THERAPY FEMALE PELVIC EVALUATION   Patient Name: Christina Deleon MRN: [id number] DOB:[birth year], 73 y.o., female Today's Date: 08/09/2023  END OF SESSION:  PT End of Session - 08/09/23 0926     Visit Number 1    Date for PT Re-Evaluation 11/08/23    Authorization Type [employer] MCR    PT Start Time 0930    PT Stop Time 1010    PT Time Calculation (min) 40 min    Activity Tolerance Patient tolerated treatment well    Behavior During Therapy [facility] for tasks assessed/performed             Past Medical History:  Diagnosis Date   Adenomatous colon polyp 10/1997, and 03/2009   Allergic rhinitis    Allergy    Aortic atherosclerosis (HCC)    Arthritis    osteo   Asthma    B12 deficiency    Chronic gastritis    Coronary artery calcification seen on CT scan    Fibromyalgia    GERD (gastroesophageal reflux disease)    Hemorrhoids    Hyperlipidemia    Hypertension    Menorrhagia    partial hysterectomy   Migraines    Osteoarthritis    PONV (postoperative nausea and vomiting)    Premature atrial contractions    Sleep disturbance    STD (sexually transmitted disease) 01/22/11 culture proven   HSV type I labia   Past Surgical History:  Procedure Laterality Date   CATARACT EXTRACTION Bilateral 10/16 & 11/16   EXCISION METACARPAL MASS Right 11/16/2016   Procedure: RIGHT LITTLE FINGER CYST EXCISION;  Surgeon: Mack Hook, MD;  Location: [facility];  Service: Orthopedics;  Laterality: Right;   TONSILLECTOMY  age 9   tooth extract     with bone graft   Turbinate sinus surgery  2003   UPPER GI ENDOSCOPY  07/2022   VAGINAL HYSTERECTOMY  1998   secondary to prolapse, ovaries remain   Patient Active Problem List   Diagnosis Date Noted   Well woman exam with routine gynecological exam 05/23/2023   Female genital prolapse 05/23/2023   Fracture Risk Assessment Score (FRAX) indicating greater than 3% risk for hip fracture 05/23/2023   Anxiety  05/21/2021   Dyslipidemia 05/21/2021   Dysphagia 05/21/2021   External hemorrhoids without complication 05/21/2021   Generalized anxiety disorder 05/21/2021   Hardening of the aorta (main artery of the heart) (HCC) 05/21/2021   History of hysterectomy 05/21/2021   Thyroid nodule 05/21/2021   Kidney stone 05/21/2021   Multinodular goiter 05/21/2021   Osteopenia 05/21/2021   Pure hypercholesterolemia 05/21/2021   Sciatica 05/21/2021   Stress due to family tension 05/21/2021   Vitamin D deficiency 05/21/2021   Cervical disc disease 05/21/2021   Unspecified thoracic, thoracolumbar and lumbosacral intervertebral disc disorder 05/21/2021   Dysosmia 12/18/2019   Cough variant asthma vs uacs  04/13/2018   Fibromyalgia 04/19/2016   Other fatigue 04/19/2016   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   Primary osteoarthritis of both knees 04/19/2016   Leg pain 01/22/2016   Restless legs syndrome 01/22/2016   Snoring 11/19/2015   Periodic limb movement sleep disorder 11/19/2015   GERD (gastroesophageal reflux disease) 10/08/2015   Mild persistent asthma 04/09/2015   LPRD (laryngopharyngeal reflux disease) 04/09/2015   Allergic rhinoconjunctivitis 04/09/2015   HEMATOCHEZIA 04/01/2010   COUGH 04/01/2010   Gastritis and gastroduodenitis 09/30/2009   NAUSEA 09/30/2009   ABDOMINAL PAIN-EPIGASTRIC 09/30/2009   DYSTHYMIC DISORDER 09/26/2009   PALPITATIONS 06/17/2009  Impingement syndrome of shoulder, left 06/04/2009   Constipation 02/05/2009   History of colonic polyps 02/05/2009   Asthma 02/04/2009   CHEST PAIN, ATYPICAL 02/04/2009   COLONIC POLYPS, BENIGN 12/18/2008   Hyperlipidemia LDL goal <70 12/18/2008   Insomnia 12/18/2008   Migraine without aura 12/18/2008   Essential hypertension 12/18/2008   Allergic rhinitis 12/18/2008   Primary osteoarthritis of both hands 12/18/2008   DJD (degenerative joint disease), cervical 12/18/2008   NECK PAIN, CHRONIC 12/18/2008   LOW BACK PAIN, CHRONIC  12/18/2008    PCP: 11/08/2023   REFERRING PROVIDER: Rosalyn Gess, MD   REFERRING DIAG: N81.9 (ICD-10-CM) - Female genital prolapse, unspecified type R33.9 (ICD-10-CM) - Urinary retention  THERAPY DIAG:  Muscle weakness (generalized)  Abnormal posture  Unspecified lack of coordination  Other muscle spasm  Rationale for Evaluation and Treatment: Rehabilitation  ONSET DATE: 6 months   SUBJECTIVE:                                                                                                                                                                                           SUBJECTIVE STATEMENT: Lt sided back pain from low back into mid and starts worse in the morning and does get a little better with time.  Does hold urine until task is finished then rushes to the bathroom and at that point has high urge .  Fluid intake:   PAIN:  Are you having pain? Yes NPRS scale: 2 lowest 8 highest /10 Pain location:  lt side of back  Pain type: sharp Pain description: stabbing   Aggravating factors: first getting up, mobility with first waking Relieving factors: time  PRECAUTIONS: None  RED FLAGS: None   WEIGHT BEARING RESTRICTIONS: No  FALLS:  Has patient fallen in last 6 months? No  OCCUPATION: retired   ACTIVITY LEVEL : house hold, minimal yard work   PLOF: Independent  PATIENT GOALS: to have stronger back and hips and less pain. Would like to have better tolerance to cooking and gardening for more than an hour to not have as much pain  PERTINENT HISTORY:  Fibromyalgia, Hemorrhoids, Hypertension, Osteoarthritis, VAGINAL HYSTERECTOMY, HSV type I labia, Anxiety Sexual abuse: No  BOWEL MOVEMENT: Pain with bowel movement: No Type of bowel movement:Type (Bristol Stool Scale) 6, Frequency every day, and Strain no Fully empty rectum: Yes:   Leakage: No Pads: No Fiber supplement/laxative No  URINATION: Pain with urination: No Fully empty bladder: Yes:    Stream: Strong and Weak Urgency: Yes  Frequency: not quicker than every 2 hours; night time varies on fluid intake 1-2x Leakage: none Pads: No  INTERCOURSE:  Ability to have vaginal penetration Yes  Pain with intercourse: none DrynessYes  Climax: not painful Marinoff Scale: 0/3 Lubricant - no  PREGNANCY: Vaginal deliveries 2 Tearing Yes: tearing "and they put me to sleep" Episiotomy No C-section deliveries 0 Currently pregnant No  PROLAPSE: None   OBJECTIVE:  Note: Objective measures were completed at Evaluation unless otherwise noted.  DIAGNOSTIC FINDINGS:  DG Lumbar Spine Complete 03/01/23  IMPRESSION: 1. Grade 1 anterolisthesis of L5 and S1 with slight loss of disc space height. 2. Facet hypertrophy in the lower lumbar spine.  PATIENT SURVEYS:    PFIQ-7: 24   COGNITION: Overall cognitive status: Within functional limits for tasks assessed     SENSATION: Light touch: Appears intact  LUMBAR SPECIAL TESTS:  Single leg stance test: unable to maintain balance without swaying more than 2s on Lt, 4s on right and  hip drop bil   FUNCTIONAL TESTS:  Functional squat - unable to complete without compensatory strategies (excessive forward flexion with minimal hip flexion, hands on thighs to return to standing, and decreased descent distance by 50%   GAIT: WFL  POSTURE: rounded shoulders, forward head, and posterior pelvic tilt   LUMBARAROM/PROM:  A/PROM A/PROM  eval  Flexion Limited by 75%  Extension Limited by 25%  Right lateral flexion Limited by 75%  Left lateral flexion Limited by 75%  Right rotation Limited by 75%  Left rotation Limited by 75%   (Blank rows = not tested)  LOWER EXTREMITY ROM:  Bil hamstrings, adductors and hip IR/ER limited by 50%  LOWER EXTREMITY MMT:  Bil hip abduction 3/5, all others 3+/5; knee 4+/5 PALPATION:   General: tightness at bil lumbar and thoracic paraspinals but worse at Lt side; bil piriformis and gluteal  tightness but worse at Lt  Pelvic Alignment: WFL  Abdominal: mild tightness in Rt mid/low quadrants but no TTP                External Perineal Exam: dryness, age related changes noted                             Internal Pelvic Floor: no TTP  Patient confirms identification and approves PT to assess internal pelvic floor and treatment Yes No emotional/communication barriers or cognitive limitation. Patient is motivated to learn. Patient understands and agrees with treatment goals and plan. PT explains patient will be examined in standing, sitting, and lying down to see how their muscles and joints work. When they are ready, they will be asked to remove their underwear so PT can examine their perineum. The patient is also given the option of providing their own chaperone as one is not provided in our facility. The patient also has the right and is explained the right to defer or refuse any part of the evaluation or treatment including the internal exam. With the patient's consent, PT will use one gloved finger to gently assess the muscles of the pelvic floor, seeing how well it contracts and relaxes and if there is muscle symmetry. After, the patient will get dressed and PT and patient will discuss exam findings and plan of care. PT and patient discuss plan of care, schedule, attendance policy and HEP activities.  PELVIC MMT:   MMT eval  Vaginal 1/5; 3s; 5 reps  Internal Anal Sphincter   External Anal Sphincter   Puborectalis   Diastasis Recti   (Blank rows = not tested)  TONE: Decreased   PROLAPSE: Possible grade 2 anterior wall laxity noted with cough in hooklying   TODAY'S TREATMENT:                                                                                                                              DATE:   08/09/23 EVAL Examination completed, findings reviewed, pt educated on POC, HEP. Pt motivated to participate in PT and agreeable to attempt recommendations.      PATIENT EDUCATION:  Education details: [id number] Person educated: Patient Education method: Explanation, Demonstration, Tactile cues, Verbal cues, and Handouts Education comprehension: verbalized understanding, returned demonstration, verbal cues required, tactile cues required, and needs further education  HOME EXERCISE PROGRAM: [phone]  ASSESSMENT:  CLINICAL IMPRESSION: Patient is a 73 y.o. female  who was seen today for physical therapy evaluation and treatment for prolapse, back pain, urgency with urine. Pt demonstrated decreased mobility at thoracic and lumbar spine and bil hips. Pt also has TTP at Lt gluteal and lumbar paraspinals. Pt demonstrated decreased strength in core and bil hips. Patient consented to internal pelvic floor assessment vaginally this date and found to have decreased strength, endurance, and coordination. Patient benefited from verbal cues for improved technique with pelvic floor contractions and breathing mechanics and coordination for decreased strain at pelvic floor. Pt also demonstrated possible grade 2 anterior vaginal wall laxity with coughing. Pt benefited from max verbal cues for activation of pelvic floor. Pt would benefit from additional PT to further address deficits.    OBJECTIVE IMPAIRMENTS: decreased activity tolerance, decreased coordination, decreased endurance, decreased mobility, decreased ROM, decreased strength, increased fascial restrictions, increased muscle spasms, impaired flexibility, improper body mechanics, postural dysfunction, and pain.   ACTIVITY LIMITATIONS: carrying, lifting, standing, squatting, continence, and locomotion level  PARTICIPATION LIMITATIONS: interpersonal relationship and community activity  PERSONAL FACTORS: Age, Fitness, Time since onset of injury/illness/exacerbation, and 1 comorbidity: medical history  are also affecting patient's functional outcome.   REHAB POTENTIAL: Good  CLINICAL DECISION MAKING:  Stable/uncomplicated  EVALUATION COMPLEXITY: Low   GOALS: Goals reviewed with patient? Yes  SHORT TERM GOALS: Target date: 09/06/23  Pt to be I with HEP.  Baseline: Goal status: INITIAL  2.  Pt to demonstrate improved coordination of pelvic floor and breathing mechanics with body weight squat with appropriate synergistic patterns to decrease pain and leakage at least 50% of the time.    Baseline:  Goal status: INITIAL  3.  Pt to demonstrate improved hip strengthening to at least 4/5 grossly at bil hips for improved pelvic stability and decreased strain at prolapse  Baseline:  Goal status: INITIAL  4.  Pt to demonstrate improved upright posture for at least 30 mins for decreased strain at pelvic floor and improved core/back strength.  Baseline:  Goal status: INITIAL   LONG TERM GOALS: Target date: 11/08/23  Pt to be I with advanced HEP.  Baseline:  Goal status: INITIAL  2.  Pt to demonstrate at least  4+/5 bil hip strength for improved pelvic stability and functional squats without increased pressure at pelvic floor .  Baseline:  Goal status: INITIAL  3. Pt be I with voiding mechanics for improved bladder evacuation.  Baseline:  Goal status: INITIAL  4.  Pt to report no urinary incontinence with urgency for improved tolerance to community outings.  Baseline:  Goal status: INITIAL  5.  Pt to demonstrate no restrictions in lumbar spine for decreased strain at prolapse.  Baseline:  Goal status: INITIAL  6.  Pt to demonstrate improved coordination of pelvic floor and breathing mechanics with 10# squat with appropriate synergistic patterns to decrease pain and leakage at least 75% of the time.    Baseline:  Goal status: INITIAL  PLAN:  PT FREQUENCY: 1-2x/week  PT DURATION:  15 sessions  PLANNED INTERVENTIONS: 97110-Therapeutic exercises, 97530- Therapeutic activity, 97112- Neuromuscular re-education, 97535- Self Care, [id number]- Manual therapy, Patient/Family education,  Taping, Dry Needling, Joint mobilization, Spinal mobilization, Scar mobilization, DME instructions, Cryotherapy, Moist heat, and Biofeedback  PLAN FOR NEXT SESSION: trunk and hip stretching and core/hip strengthening, pressure management   Otelia Sergeant, PT, DPT 08/09/2510:22 PM

## 2023-08-10 ENCOUNTER — Ambulatory Visit (INDEPENDENT_AMBULATORY_CARE_PROVIDER_SITE_OTHER)

## 2023-08-10 ENCOUNTER — Ambulatory Visit: Payer: Medicare Other | Attending: Physician Assistant | Admitting: Physician Assistant

## 2023-08-10 ENCOUNTER — Encounter: Payer: Self-pay | Admitting: Physician Assistant

## 2023-08-10 VITALS — BP 127/78 | HR 87 | Resp 16 | Ht 64.0 in | Wt 155.0 lb

## 2023-08-10 DIAGNOSIS — M7061 Trochanteric bursitis, right hip: Secondary | ICD-10-CM

## 2023-08-10 DIAGNOSIS — G4709 Other insomnia: Secondary | ICD-10-CM | POA: Diagnosis not present

## 2023-08-10 DIAGNOSIS — Z8679 Personal history of other diseases of the circulatory system: Secondary | ICD-10-CM

## 2023-08-10 DIAGNOSIS — M797 Fibromyalgia: Secondary | ICD-10-CM

## 2023-08-10 DIAGNOSIS — M7062 Trochanteric bursitis, left hip: Secondary | ICD-10-CM

## 2023-08-10 DIAGNOSIS — M25511 Pain in right shoulder: Secondary | ICD-10-CM

## 2023-08-10 DIAGNOSIS — M25512 Pain in left shoulder: Secondary | ICD-10-CM

## 2023-08-10 DIAGNOSIS — M5030 Other cervical disc degeneration, unspecified cervical region: Secondary | ICD-10-CM

## 2023-08-10 DIAGNOSIS — M25532 Pain in left wrist: Secondary | ICD-10-CM | POA: Diagnosis not present

## 2023-08-10 DIAGNOSIS — M47816 Spondylosis without myelopathy or radiculopathy, lumbar region: Secondary | ICD-10-CM

## 2023-08-10 DIAGNOSIS — M19041 Primary osteoarthritis, right hand: Secondary | ICD-10-CM

## 2023-08-10 DIAGNOSIS — Z8709 Personal history of other diseases of the respiratory system: Secondary | ICD-10-CM

## 2023-08-10 DIAGNOSIS — Z8639 Personal history of other endocrine, nutritional and metabolic disease: Secondary | ICD-10-CM

## 2023-08-10 DIAGNOSIS — Z8719 Personal history of other diseases of the digestive system: Secondary | ICD-10-CM

## 2023-08-10 DIAGNOSIS — Z860100 Personal history of colon polyps, unspecified: Secondary | ICD-10-CM

## 2023-08-10 DIAGNOSIS — G8929 Other chronic pain: Secondary | ICD-10-CM

## 2023-08-10 DIAGNOSIS — R5383 Other fatigue: Secondary | ICD-10-CM

## 2023-08-10 DIAGNOSIS — M19042 Primary osteoarthritis, left hand: Secondary | ICD-10-CM

## 2023-08-10 DIAGNOSIS — Z8669 Personal history of other diseases of the nervous system and sense organs: Secondary | ICD-10-CM

## 2023-08-10 DIAGNOSIS — E538 Deficiency of other specified B group vitamins: Secondary | ICD-10-CM

## 2023-08-10 DIAGNOSIS — M170 Bilateral primary osteoarthritis of knee: Secondary | ICD-10-CM

## 2023-08-10 NOTE — Progress Notes (Signed)
 X-rays are consistent with [facility] joint arthritis.  No erosive changes noted. No acute pathology noted.  Please notify the patient.

## 2023-08-11 ENCOUNTER — Ambulatory Visit: Admitting: Physical Therapy

## 2023-08-11 DIAGNOSIS — M6281 Muscle weakness (generalized): Secondary | ICD-10-CM | POA: Diagnosis not present

## 2023-08-11 DIAGNOSIS — R279 Unspecified lack of coordination: Secondary | ICD-10-CM

## 2023-08-11 DIAGNOSIS — R293 Abnormal posture: Secondary | ICD-10-CM

## 2023-08-11 NOTE — Patient Instructions (Signed)
  15-20 mins in the evenings based on tolerance.  Please clear any positions with doctor as needed.  Please stop any position if negative symptoms occur (dizziness/lightheadedness/fatigue/increased pain, etc.)  

## 2023-08-11 NOTE — Therapy (Signed)
 OUTPATIENT PHYSICAL THERAPY FEMALE PELVIC EVALUATION   Patient Name: Christina Deleon MRN: [id number] DOB:1950/07/31, 73 y.o., female Today's Date: 08/11/2023  END OF SESSION:  PT End of Session - 08/11/23 1232     Visit Number 2    Date for PT Re-Evaluation 11/08/23    Authorization Type [employer] MCR    PT Start Time 1230    PT Stop Time 1300   pt reports fatigue and requests to end session early   PT Time Calculation (min) 30 min    Activity Tolerance Patient tolerated treatment well    Behavior During Therapy [facility] for tasks assessed/performed             Past Medical History:  Diagnosis Date   Adenomatous colon polyp 10/1997, and 03/2009   Allergic rhinitis    Allergy    Aortic atherosclerosis (HCC)    Arthritis    osteo   Asthma    B12 deficiency    Chronic gastritis    Coronary artery calcification seen on CT scan    Fibromyalgia    GERD (gastroesophageal reflux disease)    Hemorrhoids    Hyperlipidemia    Hypertension    Menorrhagia    partial hysterectomy   Migraines    Osteoarthritis    PONV (postoperative nausea and vomiting)    Premature atrial contractions    Sleep disturbance    STD (sexually transmitted disease) 01/22/11 culture proven   HSV type I labia   Past Surgical History:  Procedure Laterality Date   CATARACT EXTRACTION Bilateral 10/16 & 11/16   EXCISION METACARPAL MASS Right 11/16/2016   Procedure: RIGHT LITTLE FINGER CYST EXCISION;  Surgeon: Mack Hook, MD;  Location: [facility];  Service: Orthopedics;  Laterality: Right;   TONSILLECTOMY  age 80   tooth extract     with bone graft   Turbinate sinus surgery  2003   UPPER GI ENDOSCOPY  07/2022   VAGINAL HYSTERECTOMY  1998   secondary to prolapse, ovaries remain   Patient Active Problem List   Diagnosis Date Noted   Well woman exam with routine gynecological exam 05/23/2023   Female genital prolapse 05/23/2023   Fracture Risk Assessment Score (FRAX) indicating greater  than 3% risk for hip fracture 05/23/2023   Anxiety 05/21/2021   Dyslipidemia 05/21/2021   Dysphagia 05/21/2021   External hemorrhoids without complication 05/21/2021   Generalized anxiety disorder 05/21/2021   Hardening of the aorta (main artery of the heart) (HCC) 05/21/2021   History of hysterectomy 05/21/2021   Thyroid nodule 05/21/2021   Kidney stone 05/21/2021   Multinodular goiter 05/21/2021   Osteopenia 05/21/2021   Pure hypercholesterolemia 05/21/2021   Sciatica 05/21/2021   Stress due to family tension 05/21/2021   Vitamin D deficiency 05/21/2021   Cervical disc disease 05/21/2021   Unspecified thoracic, thoracolumbar and lumbosacral intervertebral disc disorder 05/21/2021   Dysosmia 12/18/2019   Cough variant asthma vs uacs  04/13/2018   Fibromyalgia 04/19/2016   Other fatigue 04/19/2016   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   Primary osteoarthritis of both knees 04/19/2016   Leg pain 01/22/2016   Restless legs syndrome 01/22/2016   Snoring 11/19/2015   Periodic limb movement sleep disorder 11/19/2015   GERD (gastroesophageal reflux disease) 10/08/2015   Mild persistent asthma 04/09/2015   LPRD (laryngopharyngeal reflux disease) 04/09/2015   Allergic rhinoconjunctivitis 04/09/2015   HEMATOCHEZIA 04/01/2010   COUGH 04/01/2010   Gastritis and gastroduodenitis 09/30/2009   NAUSEA 09/30/2009   ABDOMINAL PAIN-EPIGASTRIC 09/30/2009  DYSTHYMIC DISORDER 09/26/2009   PALPITATIONS 06/17/2009   Impingement syndrome of shoulder, left 06/04/2009   Constipation 02/05/2009   History of colonic polyps 02/05/2009   Asthma 02/04/2009   CHEST PAIN, ATYPICAL 02/04/2009   COLONIC POLYPS, BENIGN 12/18/2008   Hyperlipidemia LDL goal <70 12/18/2008   Insomnia 12/18/2008   Migraine without aura 12/18/2008   Essential hypertension 12/18/2008   Allergic rhinitis 12/18/2008   Primary osteoarthritis of both hands 12/18/2008   DJD (degenerative joint disease), cervical 12/18/2008   NECK  PAIN, CHRONIC 12/18/2008   LOW BACK PAIN, CHRONIC 12/18/2008    PCP: 11/08/2023   REFERRING PROVIDER: Rosalyn Gess, MD   REFERRING DIAG: N81.9 (ICD-10-CM) - Female genital prolapse, unspecified type R33.9 (ICD-10-CM) - Urinary retention  THERAPY DIAG:  Muscle weakness (generalized)  Unspecified lack of coordination  Abnormal posture  Rationale for Evaluation and Treatment: Rehabilitation  ONSET DATE: 6 months   SUBJECTIVE:                                                                                                                                                                                           SUBJECTIVE STATEMENT: Lt sided back pain from low back into mid and starts worse in the morning and does get a little better with time.  Does hold urine until task is finished then rushes to the bathroom and at that point has high urge .  Fluid intake:   PAIN:  Are you having pain? Yes NPRS scale: 2 lowest 8 highest /10 Pain location:  lt side of back  Pain type: sharp Pain description: stabbing   Aggravating factors: first getting up, mobility with first waking Relieving factors: time  PRECAUTIONS: None  RED FLAGS: None   WEIGHT BEARING RESTRICTIONS: No  FALLS:  Has patient fallen in last 6 months? No  OCCUPATION: retired   ACTIVITY LEVEL : house hold, minimal yard work   PLOF: Independent  PATIENT GOALS: to have stronger back and hips and less pain. Would like to have better tolerance to cooking and gardening for more than an hour to not have as much pain  PERTINENT HISTORY:  Fibromyalgia, Hemorrhoids, Hypertension, Osteoarthritis, VAGINAL HYSTERECTOMY, HSV type I labia, Anxiety Sexual abuse: No  BOWEL MOVEMENT: Pain with bowel movement: No Type of bowel movement:Type (Bristol Stool Scale) 6, Frequency every day, and Strain no Fully empty rectum: Yes:   Leakage: No Pads: No Fiber supplement/laxative No  URINATION: Pain with urination:  No Fully empty bladder: Yes:   Stream: Strong and Weak Urgency: Yes  Frequency: not quicker than every 2 hours; night time varies on fluid intake 1-2x Leakage:  none Pads: No  INTERCOURSE:  Ability to have vaginal penetration Yes  Pain with intercourse: none DrynessYes  Climax: not painful Marinoff Scale: 0/3 Lubricant - no  PREGNANCY: Vaginal deliveries 2 Tearing Yes: tearing "and they put me to sleep" Episiotomy No C-section deliveries 0 Currently pregnant No  PROLAPSE: None   OBJECTIVE:  Note: Objective measures were completed at Evaluation unless otherwise noted.  DIAGNOSTIC FINDINGS:  DG Lumbar Spine Complete 03/01/23  IMPRESSION: 1. Grade 1 anterolisthesis of L5 and S1 with slight loss of disc space height. 2. Facet hypertrophy in the lower lumbar spine.  PATIENT SURVEYS:    PFIQ-7: 6   COGNITION: Overall cognitive status: Within functional limits for tasks assessed     SENSATION: Light touch: Appears intact  LUMBAR SPECIAL TESTS:  Single leg stance test: unable to maintain balance without swaying more than 2s on Lt, 4s on right and  hip drop bil   FUNCTIONAL TESTS:  Functional squat - unable to complete without compensatory strategies (excessive forward flexion with minimal hip flexion, hands on thighs to return to standing, and decreased descent distance by 50%   GAIT: WFL  POSTURE: rounded shoulders, forward head, and posterior pelvic tilt   LUMBARAROM/PROM:  A/PROM A/PROM  eval  Flexion Limited by 75%  Extension Limited by 25%  Right lateral flexion Limited by 75%  Left lateral flexion Limited by 75%  Right rotation Limited by 75%  Left rotation Limited by 75%   (Blank rows = not tested)  LOWER EXTREMITY ROM:  Bil hamstrings, adductors and hip IR/ER limited by 50%  LOWER EXTREMITY MMT:  Bil hip abduction 3/5, all others 3+/5; knee 4+/5 PALPATION:   General: tightness at bil lumbar and thoracic paraspinals but worse at Lt side;  bil piriformis and gluteal tightness but worse at Lt  Pelvic Alignment: WFL  Abdominal: mild tightness in Rt mid/low quadrants but no TTP                External Perineal Exam: dryness, age related changes noted                             Internal Pelvic Floor: no TTP  Patient confirms identification and approves PT to assess internal pelvic floor and treatment Yes No emotional/communication barriers or cognitive limitation. Patient is motivated to learn. Patient understands and agrees with treatment goals and plan. PT explains patient will be examined in standing, sitting, and lying down to see how their muscles and joints work. When they are ready, they will be asked to remove their underwear so PT can examine their perineum. The patient is also given the option of providing their own chaperone as one is not provided in our facility. The patient also has the right and is explained the right to defer or refuse any part of the evaluation or treatment including the internal exam. With the patient's consent, PT will use one gloved finger to gently assess the muscles of the pelvic floor, seeing how well it contracts and relaxes and if there is muscle symmetry. After, the patient will get dressed and PT and patient will discuss exam findings and plan of care. PT and patient discuss plan of care, schedule, attendance policy and HEP activities.  PELVIC MMT:   MMT eval  Vaginal 1/5; 3s; 5 reps  Internal Anal Sphincter   External Anal Sphincter   Puborectalis   Diastasis Recti   (Blank rows = not tested)  TONE: Decreased   PROLAPSE: Possible grade 2 anterior wall laxity noted with cough in hooklying   TODAY'S TREATMENT:                                                                                                                              DATE:   08/09/23 EVAL Examination completed, findings reviewed, pt educated on POC, HEP. Pt motivated to participate in PT and agreeable to  attempt recommendations.    08/11/23 Supine pelvic floor contractions with exhale - pt reports she is unable to feels contractions consistently Hooklying opp hand/ball press 2x10 with exhale Hooklying transverse abdominis 2x10 with max cues for coordination of breathing however difficult for pt to maintain, x10 transverse abdominis without pressure management I  Hooklying red loop 2x10 with exhale 2x10 bridges with exhale 2x10 10# Sit to stand  Farmers carry 10# 750' Rt hand only - pt reports she can't hold weight in Lt hand Educated on prolapse relief positions and handout given   PATIENT EDUCATION:  Education details: [id number] Person educated: Patient Education method: Explanation, Demonstration, Tactile cues, Verbal cues, and Handouts Education comprehension: verbalized understanding, returned demonstration, verbal cues required, tactile cues required, and needs further education  HOME EXERCISE PROGRAM: [id number]  ASSESSMENT:  CLINICAL IMPRESSION: Patient is a 73 y.o. female  who was seen today for physical therapy evaluation and treatment for prolapse, back pain, urgency with urine. Pt demonstrated decreased mobility at thoracic and lumbar spine and bil hips. Pt session focused on coordination of pelvic floor and breathing with exercises however pt reports great difficulty with contracting pelvic floor or has difficulty with awareness/sensation of if she is contracting pelvic floor with activity. Pt benefited from cues for techniques throughout, denied pressure/heaviness throughout.  Pt benefited from max verbal cues for activation of pelvic floor. Pt would benefit from additional PT to further address deficits.    OBJECTIVE IMPAIRMENTS: decreased activity tolerance, decreased coordination, decreased endurance, decreased mobility, decreased ROM, decreased strength, increased fascial restrictions, increased muscle spasms, impaired flexibility, improper body mechanics, postural  dysfunction, and pain.   ACTIVITY LIMITATIONS: carrying, lifting, standing, squatting, continence, and locomotion level  PARTICIPATION LIMITATIONS: interpersonal relationship and community activity  PERSONAL FACTORS: Age, Fitness, Time since onset of injury/illness/exacerbation, and 1 comorbidity: medical history  are also affecting patient's functional outcome.   REHAB POTENTIAL: Good  CLINICAL DECISION MAKING: Stable/uncomplicated  EVALUATION COMPLEXITY: Low   GOALS: Goals reviewed with patient? Yes  SHORT TERM GOALS: Target date: 09/06/23  Pt to be I with HEP.  Baseline: Goal status: INITIAL  2.  Pt to demonstrate improved coordination of pelvic floor and breathing mechanics with body weight squat with appropriate synergistic patterns to decrease pain and leakage at least 50% of the time.    Baseline:  Goal status: INITIAL  3.  Pt to demonstrate improved hip strengthening to at least 4/5 grossly at bil hips for improved pelvic stability and decreased strain at prolapse  Baseline:  Goal status: INITIAL  4.  Pt to demonstrate improved upright posture for at least 30 mins for decreased strain at pelvic floor and improved core/back strength.  Baseline:  Goal status: INITIAL   LONG TERM GOALS: Target date: 11/08/23  Pt to be I with advanced HEP.  Baseline:  Goal status: INITIAL  2.  Pt to demonstrate at least 4+/5 bil hip strength for improved pelvic stability and functional squats without increased pressure at pelvic floor .  Baseline:  Goal status: INITIAL  3. Pt be I with voiding mechanics for improved bladder evacuation.  Baseline:  Goal status: INITIAL  4.  Pt to report no urinary incontinence with urgency for improved tolerance to community outings.  Baseline:  Goal status: INITIAL  5.  Pt to demonstrate no restrictions in lumbar spine for decreased strain at prolapse.  Baseline:  Goal status: INITIAL  6.  Pt to demonstrate improved coordination of pelvic  floor and breathing mechanics with 10# squat with appropriate synergistic patterns to decrease pain and leakage at least 75% of the time.    Baseline:  Goal status: INITIAL  PLAN:  PT FREQUENCY: 1-2x/week  PT DURATION:  15 sessions  PLANNED INTERVENTIONS: 97110-Therapeutic exercises, 97530- Therapeutic activity, 97112- Neuromuscular re-education, 97535- Self Care, 16109- Manual therapy, Patient/Family education, Taping, Dry Needling, Joint mobilization, Spinal mobilization, Scar mobilization, DME instructions, Cryotherapy, Moist heat, and Biofeedback  PLAN FOR NEXT SESSION: trunk and hip stretching and core/hip strengthening, pressure management   Otelia Sergeant, PT, DPT 04/10/251:05 PM

## 2023-08-13 LAB — SEDIMENTATION RATE: Sed Rate: 31 mm/h — ABNORMAL HIGH (ref 0–30)

## 2023-08-13 LAB — MUTATED CITRULLINATED VIMENTIN (MCV) ANTIBODY: MUTATED CITRULLINATED VIMENTIN (MCV) AB: <20 U/mL (ref <20)

## 2023-08-13 LAB — RHEUMATOID FACTOR: Rheumatoid fact SerPl-aCnc: <10 [IU]/mL (ref <14)

## 2023-08-13 LAB — CYCLIC CITRUL PEPTIDE ANTIBODY, IGG: Cyclic Citrullin Peptide Ab: <16 U

## 2023-08-13 LAB — C-REACTIVE PROTEIN: CRP: 3.7 mg/L (ref <8.0)

## 2023-08-13 NOTE — Progress Notes (Signed)
 Sed rate is mildly elevated, rheumatoid factor negative, anti-CCP negative, CRP normal, MCV negative

## 2023-08-17 ENCOUNTER — Ambulatory Visit: Payer: Medicare Other | Admitting: Physical Therapy

## 2023-08-17 ENCOUNTER — Other Ambulatory Visit: Payer: Self-pay | Admitting: Physician Assistant

## 2023-08-17 DIAGNOSIS — M6281 Muscle weakness (generalized): Secondary | ICD-10-CM | POA: Diagnosis not present

## 2023-08-17 DIAGNOSIS — R293 Abnormal posture: Secondary | ICD-10-CM

## 2023-08-17 DIAGNOSIS — M62838 Other muscle spasm: Secondary | ICD-10-CM

## 2023-08-17 DIAGNOSIS — R279 Unspecified lack of coordination: Secondary | ICD-10-CM

## 2023-08-17 NOTE — Therapy (Signed)
 OUTPATIENT PHYSICAL THERAPY FEMALE PELVIC TREATMENT   Patient Name: Christina Deleon MRN: [id number] DOB:February 10, 1951, 73 y.o., female Today's Date: 08/17/2023  END OF SESSION:  PT End of Session - 08/17/23 0939     Visit Number 3    Date for PT Re-Evaluation 11/08/23    Authorization Type [employer] MCR    PT Start Time 0932    PT Stop Time 1014    PT Time Calculation (min) 42 min    Activity Tolerance Patient tolerated treatment well    Behavior During Therapy [facility] for tasks assessed/performed             Past Medical History:  Diagnosis Date   Adenomatous colon polyp 10/1997, and 03/2009   Allergic rhinitis    Allergy    Aortic atherosclerosis (HCC)    Arthritis    osteo   Asthma    B12 deficiency    Chronic gastritis    Coronary artery calcification seen on CT scan    Fibromyalgia    GERD (gastroesophageal reflux disease)    Hemorrhoids    Hyperlipidemia    Hypertension    Menorrhagia    partial hysterectomy   Migraines    Osteoarthritis    PONV (postoperative nausea and vomiting)    Premature atrial contractions    Sleep disturbance    STD (sexually transmitted disease) 01/22/11 culture proven   HSV type I labia   Past Surgical History:  Procedure Laterality Date   CATARACT EXTRACTION Bilateral 10/16 & 11/16   EXCISION METACARPAL MASS Right 11/16/2016   Procedure: RIGHT LITTLE FINGER CYST EXCISION;  Surgeon: Mack Hook, MD;  Location: [facility];  Service: Orthopedics;  Laterality: Right;   TONSILLECTOMY  age 37   tooth extract     with bone graft   Turbinate sinus surgery  2003   UPPER GI ENDOSCOPY  07/2022   VAGINAL HYSTERECTOMY  1998   secondary to prolapse, ovaries remain   Patient Active Problem List   Diagnosis Date Noted   Well woman exam with routine gynecological exam 05/23/2023   Female genital prolapse 05/23/2023   Fracture Risk Assessment Score (FRAX) indicating greater than 3% risk for hip fracture 05/23/2023   Anxiety  05/21/2021   Dyslipidemia 05/21/2021   Dysphagia 05/21/2021   External hemorrhoids without complication 05/21/2021   Generalized anxiety disorder 05/21/2021   Hardening of the aorta (main artery of the heart) (HCC) 05/21/2021   History of hysterectomy 05/21/2021   Thyroid nodule 05/21/2021   Kidney stone 05/21/2021   Multinodular goiter 05/21/2021   Osteopenia 05/21/2021   Pure hypercholesterolemia 05/21/2021   Sciatica 05/21/2021   Stress due to family tension 05/21/2021   Vitamin D deficiency 05/21/2021   Cervical disc disease 05/21/2021   Unspecified thoracic, thoracolumbar and lumbosacral intervertebral disc disorder 05/21/2021   Dysosmia 12/18/2019   Cough variant asthma vs uacs  04/13/2018   Fibromyalgia 04/19/2016   Other fatigue 04/19/2016   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   Primary osteoarthritis of both knees 04/19/2016   Leg pain 01/22/2016   Restless legs syndrome 01/22/2016   Snoring 11/19/2015   Periodic limb movement sleep disorder 11/19/2015   GERD (gastroesophageal reflux disease) 10/08/2015   Mild persistent asthma 04/09/2015   LPRD (laryngopharyngeal reflux disease) 04/09/2015   Allergic rhinoconjunctivitis 04/09/2015   HEMATOCHEZIA 04/01/2010   COUGH 04/01/2010   Gastritis and gastroduodenitis 09/30/2009   NAUSEA 09/30/2009   ABDOMINAL PAIN-EPIGASTRIC 09/30/2009   DYSTHYMIC DISORDER 09/26/2009   PALPITATIONS 06/17/2009  Impingement syndrome of shoulder, left 06/04/2009   Constipation 02/05/2009   History of colonic polyps 02/05/2009   Asthma 02/04/2009   CHEST PAIN, ATYPICAL 02/04/2009   COLONIC POLYPS, BENIGN 12/18/2008   Hyperlipidemia LDL goal <70 12/18/2008   Insomnia 12/18/2008   Migraine without aura 12/18/2008   Essential hypertension 12/18/2008   Allergic rhinitis 12/18/2008   Primary osteoarthritis of both hands 12/18/2008   DJD (degenerative joint disease), cervical 12/18/2008   NECK PAIN, CHRONIC 12/18/2008   LOW BACK PAIN, CHRONIC  12/18/2008    PCP: 11/08/2023   REFERRING PROVIDER: Romaine Closs, MD   REFERRING DIAG: N81.9 (ICD-10-CM) - Female genital prolapse, unspecified type R33.9 (ICD-10-CM) - Urinary retention  THERAPY DIAG:  Muscle weakness (generalized)  Unspecified lack of coordination  Abnormal posture  Other muscle spasm  Rationale for Evaluation and Treatment: Rehabilitation  ONSET DATE: 6 months   SUBJECTIVE:                                                                                                                                                                                           SUBJECTIVE STATEMENT: Pt reports she feels some back pain this morning, unsure if this is from prolapse but has been coughing with asthma this morning.    PAIN:  Are you having pain? Yes NPRS scale: 3/10 Pain location:  lt side of back worse but present bil  Pain type: soreness Pain description: dull   Aggravating factors: first getting up, mobility with first waking Relieving factors: time  PRECAUTIONS: None  RED FLAGS: None   WEIGHT BEARING RESTRICTIONS: No  FALLS:  Has patient fallen in last 6 months? No  OCCUPATION: retired   ACTIVITY LEVEL : house hold, minimal yard work   PLOF: Independent  PATIENT GOALS: to have stronger back and hips and less pain. Would like to have better tolerance to cooking and gardening for more than an hour to not have as much pain  PERTINENT HISTORY:  Fibromyalgia, Hemorrhoids, Hypertension, Osteoarthritis, VAGINAL HYSTERECTOMY, HSV type I labia, Anxiety Sexual abuse: No  BOWEL MOVEMENT: Pain with bowel movement: No Type of bowel movement:Type (Bristol Stool Scale) 6, Frequency every day, and Strain no Fully empty rectum: Yes:   Leakage: No Pads: No Fiber supplement/laxative No  URINATION: Pain with urination: No Fully empty bladder: Yes:   Stream: Strong and Weak Urgency: Yes  Frequency: not quicker than every 2 hours; night time varies  on fluid intake 1-2x Leakage: none Pads: No  INTERCOURSE:  Ability to have vaginal penetration Yes  Pain with intercourse: none DrynessYes  Climax: not painful Marinoff Scale: 0/3 Lubricant - no  PREGNANCY: Vaginal deliveries 2 Tearing Yes: tearing "and they put me to sleep" Episiotomy No C-section deliveries 0 Currently pregnant No  PROLAPSE: None   OBJECTIVE:  Note: Objective measures were completed at Evaluation unless otherwise noted.  DIAGNOSTIC FINDINGS:  DG Lumbar Spine Complete 03/01/23  IMPRESSION: 1. Grade 1 anterolisthesis of L5 and S1 with slight loss of disc space height. 2. Facet hypertrophy in the lower lumbar spine.  PATIENT SURVEYS:    PFIQ-7: 77   COGNITION: Overall cognitive status: Within functional limits for tasks assessed     SENSATION: Light touch: Appears intact  LUMBAR SPECIAL TESTS:  Single leg stance test: unable to maintain balance without swaying more than 2s on Lt, 4s on right and  hip drop bil   FUNCTIONAL TESTS:  Functional squat - unable to complete without compensatory strategies (excessive forward flexion with minimal hip flexion, hands on thighs to return to standing, and decreased descent distance by 50%   GAIT: WFL  POSTURE: rounded shoulders, forward head, and posterior pelvic tilt   LUMBARAROM/PROM:  A/PROM A/PROM  eval  Flexion Limited by 75%  Extension Limited by 25%  Right lateral flexion Limited by 75%  Left lateral flexion Limited by 75%  Right rotation Limited by 75%  Left rotation Limited by 75%   (Blank rows = not tested)  LOWER EXTREMITY ROM:  Bil hamstrings, adductors and hip IR/ER limited by 50%  LOWER EXTREMITY MMT:  Bil hip abduction 3/5, all others 3+/5; knee 4+/5 PALPATION:   General: tightness at bil lumbar and thoracic paraspinals but worse at Lt side; bil piriformis and gluteal tightness but worse at Lt  Pelvic Alignment: WFL  Abdominal: mild tightness in Rt mid/low quadrants but  no TTP                External Perineal Exam: dryness, age related changes noted                             Internal Pelvic Floor: no TTP  Patient confirms identification and approves PT to assess internal pelvic floor and treatment Yes No emotional/communication barriers or cognitive limitation. Patient is motivated to learn. Patient understands and agrees with treatment goals and plan. PT explains patient will be examined in standing, sitting, and lying down to see how their muscles and joints work. When they are ready, they will be asked to remove their underwear so PT can examine their perineum. The patient is also given the option of providing their own chaperone as one is not provided in our facility. The patient also has the right and is explained the right to defer or refuse any part of the evaluation or treatment including the internal exam. With the patient's consent, PT will use one gloved finger to gently assess the muscles of the pelvic floor, seeing how well it contracts and relaxes and if there is muscle symmetry. After, the patient will get dressed and PT and patient will discuss exam findings and plan of care. PT and patient discuss plan of care, schedule, attendance policy and HEP activities.  PELVIC MMT:   MMT eval  Vaginal 1/5; 3s; 5 reps  Internal Anal Sphincter   External Anal Sphincter   Puborectalis   Diastasis Recti   (Blank rows = not tested)        TONE: Decreased   PROLAPSE: Possible grade 2 anterior wall laxity noted with cough in hooklying   TODAY'S TREATMENT:  DATE:   08/09/23 EVAL Examination completed, findings reviewed, pt educated on POC, HEP. Pt motivated to participate in PT and agreeable to attempt recommendations.    08/11/23 Supine pelvic floor contractions with exhale - pt reports she is unable to feels contractions  consistently Hooklying opp hand/ball press 2x10 with exhale Hooklying transverse abdominis 2x10 with max cues for coordination of breathing however difficult for pt to maintain, x10 transverse abdominis without pressure management I  Hooklying red loop 2x10 with exhale 2x10 bridges with exhale 2x10 10# Sit to stand  Farmers carry 10# 750' Rt hand only - pt reports she can't hold weight in Lt hand Educated on prolapse relief positions and handout given  08/17/23: NuStep - 5 mins L4 for improved reciprocal leg mobility and improved tissue mobility at back, PT present to discuss progress  Red loop hip abduction hooklying 2x10 Red loop hip flexion 2x10 Palloffs small power cord x10 each 2x10 Sit to stand 10# max cues for coordination breathing and pelvic floor  Trunk rotation stretch in modified standing x10 each Standing pelvic tilts x10 - max cues for technique   PATIENT EDUCATION:  Education details: [id number] Person educated: Patient Education method: Explanation, Demonstration, Tactile cues, Verbal cues, and Handouts Education comprehension: verbalized understanding, returned demonstration, verbal cues required, tactile cues required, and needs further education  HOME EXERCISE PROGRAM: [id number]  ASSESSMENT:  CLINICAL IMPRESSION: Patient is a 73 y.o. female  who was seen today for physical therapy evaluation and treatment for prolapse, back pain, urgency with urine. Pt demonstrated decreased mobility at thoracic and lumbar spine and bil hips. Pt session focused on coordination of pelvic floor and breathing with exercises. Pt benefited from mod-max cues for techniques throughout, denied pressure/heaviness throughout. Tolerated increased challenge of exercise well today  with more standing and resistance. Pt benefited from max verbal cues for activation of pelvic floor. Pt would benefit from additional PT to further address deficits.    OBJECTIVE IMPAIRMENTS: decreased activity  tolerance, decreased coordination, decreased endurance, decreased mobility, decreased ROM, decreased strength, increased fascial restrictions, increased muscle spasms, impaired flexibility, improper body mechanics, postural dysfunction, and pain.   ACTIVITY LIMITATIONS: carrying, lifting, standing, squatting, continence, and locomotion level  PARTICIPATION LIMITATIONS: interpersonal relationship and community activity  PERSONAL FACTORS: Age, Fitness, Time since onset of injury/illness/exacerbation, and 1 comorbidity: medical history  are also affecting patient's functional outcome.   REHAB POTENTIAL: Good  CLINICAL DECISION MAKING: Stable/uncomplicated  EVALUATION COMPLEXITY: Low   GOALS: Goals reviewed with patient? Yes  SHORT TERM GOALS: Target date: 09/06/23  Pt to be I with HEP.  Baseline: Goal status: INITIAL  2.  Pt to demonstrate improved coordination of pelvic floor and breathing mechanics with body weight squat with appropriate synergistic patterns to decrease pain and leakage at least 50% of the time.    Baseline:  Goal status: INITIAL  3.  Pt to demonstrate improved hip strengthening to at least 4/5 grossly at bil hips for improved pelvic stability and decreased strain at prolapse  Baseline:  Goal status: INITIAL  4.  Pt to demonstrate improved upright posture for at least 30 mins for decreased strain at pelvic floor and improved core/back strength.  Baseline:  Goal status: INITIAL   LONG TERM GOALS: Target date: 11/08/23  Pt to be I with advanced HEP.  Baseline:  Goal status: INITIAL  2.  Pt to demonstrate at least 4+/5 bil hip strength for improved pelvic stability and functional squats without increased pressure at pelvic floor .  Baseline:  Goal status: INITIAL  3. Pt be I with voiding mechanics for improved bladder evacuation.  Baseline:  Goal status: INITIAL  4.  Pt to report no urinary incontinence with urgency for improved tolerance to community  outings.  Baseline:  Goal status: INITIAL  5.  Pt to demonstrate no restrictions in lumbar spine for decreased strain at prolapse.  Baseline:  Goal status: INITIAL  6.  Pt to demonstrate improved coordination of pelvic floor and breathing mechanics with 10# squat with appropriate synergistic patterns to decrease pain and leakage at least 75% of the time.    Baseline:  Goal status: INITIAL  PLAN:  PT FREQUENCY: 1-2x/week  PT DURATION:  15 sessions  PLANNED INTERVENTIONS: 97110-Therapeutic exercises, 97530- Therapeutic activity, 97112- Neuromuscular re-education, 97535- Self Care, 14782- Manual therapy, Patient/Family education, Taping, Dry Needling, Joint mobilization, Spinal mobilization, Scar mobilization, DME instructions, Cryotherapy, Moist heat, and Biofeedback  PLAN FOR NEXT SESSION: trunk and hip stretching and core/hip strengthening, pressure management   Avie Lemme, PT, DPT 08/16/2508:10 AM

## 2023-08-18 NOTE — Telephone Encounter (Signed)
 Last Fill: 07/18/2023  Next Visit: 02/21/2024  Last Visit: 08/10/2023  Dx: Other insomnia   Current Dose per office note on 08/10/2023: Ambien 5 mg 1 tablet by mouth at bedtime for insomnia.   Okay to refill Ambien?

## 2023-08-24 ENCOUNTER — Ambulatory Visit: Payer: Medicare Other | Admitting: Physical Therapy

## 2023-08-24 DIAGNOSIS — M6281 Muscle weakness (generalized): Secondary | ICD-10-CM | POA: Diagnosis not present

## 2023-08-24 DIAGNOSIS — M62838 Other muscle spasm: Secondary | ICD-10-CM

## 2023-08-24 DIAGNOSIS — R293 Abnormal posture: Secondary | ICD-10-CM

## 2023-08-24 DIAGNOSIS — R279 Unspecified lack of coordination: Secondary | ICD-10-CM

## 2023-08-24 NOTE — Therapy (Signed)
 OUTPATIENT PHYSICAL THERAPY FEMALE PELVIC TREATMENT   Patient Name: Christina Deleon MRN: [id number] DOB:02/25/1951, 73 y.o., female Today's Date: 08/24/2023  END OF SESSION:  PT End of Session - 08/24/23 0932     Visit Number 4    Date for PT Re-Evaluation 11/08/23    Authorization Type [employer] MCR    PT Start Time 0930    PT Stop Time 1010    PT Time Calculation (min) 40 min    Activity Tolerance Patient tolerated treatment well    Behavior During Therapy [facility] for tasks assessed/performed             Past Medical History:  Diagnosis Date   Adenomatous colon polyp 10/1997, and 03/2009   Allergic rhinitis    Allergy     Aortic atherosclerosis (HCC)    Arthritis    osteo   Asthma    B12 deficiency    Chronic gastritis    Coronary artery calcification seen on CT scan    Fibromyalgia    GERD (gastroesophageal reflux disease)    Hemorrhoids    Hyperlipidemia    Hypertension    Menorrhagia    partial hysterectomy   Migraines    Osteoarthritis    PONV (postoperative nausea and vomiting)    Premature atrial contractions    Sleep disturbance    STD (sexually transmitted disease) 01/22/11 culture proven   HSV type I labia   Past Surgical History:  Procedure Laterality Date   CATARACT EXTRACTION Bilateral 10/16 & 11/16   EXCISION METACARPAL MASS Right 11/16/2016   Procedure: RIGHT LITTLE FINGER CYST EXCISION;  Surgeon: Rober Chimera, MD;  Location: [facility];  Service: Orthopedics;  Laterality: Right;   TONSILLECTOMY  age 26   tooth extract     with bone graft   Turbinate sinus surgery  2003   UPPER GI ENDOSCOPY  07/2022   VAGINAL HYSTERECTOMY  1998   secondary to prolapse, ovaries remain   Patient Active Problem List   Diagnosis Date Noted   Well woman exam with routine gynecological exam 05/23/2023   Female genital prolapse 05/23/2023   Fracture Risk Assessment Score (FRAX) indicating greater than 3% risk for hip fracture 05/23/2023   Anxiety  05/21/2021   Dyslipidemia 05/21/2021   Dysphagia 05/21/2021   External hemorrhoids without complication 05/21/2021   Generalized anxiety disorder 05/21/2021   Hardening of the aorta (main artery of the heart) (HCC) 05/21/2021   History of hysterectomy 05/21/2021   Thyroid  nodule 05/21/2021   Kidney stone 05/21/2021   Multinodular goiter 05/21/2021   Osteopenia 05/21/2021   Pure hypercholesterolemia 05/21/2021   Sciatica 05/21/2021   Stress due to family tension 05/21/2021   Vitamin D  deficiency 05/21/2021   Cervical disc disease 05/21/2021   Unspecified thoracic, thoracolumbar and lumbosacral intervertebral disc disorder 05/21/2021   Dysosmia 12/18/2019   Cough variant asthma vs uacs  04/13/2018   Fibromyalgia 04/19/2016   Other fatigue 04/19/2016   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   Primary osteoarthritis of both knees 04/19/2016   Leg pain 01/22/2016   Restless legs syndrome 01/22/2016   Snoring 11/19/2015   Periodic limb movement sleep disorder 11/19/2015   GERD (gastroesophageal reflux disease) 10/08/2015   Mild persistent asthma 04/09/2015   LPRD (laryngopharyngeal reflux disease) 04/09/2015   Allergic rhinoconjunctivitis 04/09/2015   HEMATOCHEZIA 04/01/2010   COUGH 04/01/2010   Gastritis and gastroduodenitis 09/30/2009   NAUSEA 09/30/2009   ABDOMINAL PAIN-EPIGASTRIC 09/30/2009   DYSTHYMIC DISORDER 09/26/2009   PALPITATIONS 06/17/2009  Impingement syndrome of shoulder, left 06/04/2009   Constipation 02/05/2009   History of colonic polyps 02/05/2009   Asthma 02/04/2009   CHEST PAIN, ATYPICAL 02/04/2009   COLONIC POLYPS, BENIGN 12/18/2008   Hyperlipidemia LDL goal <70 12/18/2008   Insomnia 12/18/2008   Migraine without aura 12/18/2008   Essential hypertension 12/18/2008   Allergic rhinitis 12/18/2008   Primary osteoarthritis of both hands 12/18/2008   DJD (degenerative joint disease), cervical 12/18/2008   NECK PAIN, CHRONIC 12/18/2008   LOW BACK PAIN, CHRONIC  12/18/2008    PCP: 11/08/2023   REFERRING PROVIDER: Romaine Closs, MD   REFERRING DIAG: N81.9 (ICD-10-CM) - Female genital prolapse, unspecified type R33.9 (ICD-10-CM) - Urinary retention  THERAPY DIAG:  Muscle weakness (generalized)  Unspecified lack of coordination  Abnormal posture  Other muscle spasm  Rationale for Evaluation and Treatment: Rehabilitation  ONSET DATE: 6 months   SUBJECTIVE:                                                                                                                                                                                           SUBJECTIVE STATEMENT: Pt reports she feels she has a lot of improvement for about 2-3 days after PT sessions and has almost no pressure at all then slowly starts returning. Still working on HEP at home    PAIN:  Are you having pain? Yes NPRS scale: 5/10 Pain location:  lt side of back worse but present bil  Pain type: soreness Pain description: dull   Aggravating factors: first getting up, mobility with first waking Relieving factors: time  PRECAUTIONS: None  RED FLAGS: None   WEIGHT BEARING RESTRICTIONS: No  FALLS:  Has patient fallen in last 6 months? No  OCCUPATION: retired   ACTIVITY LEVEL : house hold, minimal yard work   PLOF: Independent  PATIENT GOALS: to have stronger back and hips and less pain. Would like to have better tolerance to cooking and gardening for more than an hour to not have as much pain  PERTINENT HISTORY:  Fibromyalgia, Hemorrhoids, Hypertension, Osteoarthritis, VAGINAL HYSTERECTOMY, HSV type I labia, Anxiety Sexual abuse: No  BOWEL MOVEMENT: Pain with bowel movement: No Type of bowel movement:Type (Bristol Stool Scale) 6, Frequency every day, and Strain no Fully empty rectum: Yes:   Leakage: No Pads: No Fiber supplement/laxative No  URINATION: Pain with urination: No Fully empty bladder: Yes:   Stream: Strong and Weak Urgency: Yes  Frequency:  not quicker than every 2 hours; night time varies on fluid intake 1-2x Leakage: none Pads: No  INTERCOURSE:  Ability to have vaginal penetration Yes  Pain with intercourse: none  DrynessYes  Climax: not painful Marinoff Scale: 0/3 Lubricant - no  PREGNANCY: Vaginal deliveries 2 Tearing Yes: tearing "and they put me to sleep" Episiotomy No C-section deliveries 0 Currently pregnant No  PROLAPSE: None   OBJECTIVE:  Note: Objective measures were completed at Evaluation unless otherwise noted.  DIAGNOSTIC FINDINGS:  DG Lumbar Spine Complete 03/01/23  IMPRESSION: 1. Grade 1 anterolisthesis of L5 and S1 with slight loss of disc space height. 2. Facet hypertrophy in the lower lumbar spine.  PATIENT SURVEYS:    PFIQ-7: 60   COGNITION: Overall cognitive status: Within functional limits for tasks assessed     SENSATION: Light touch: Appears intact  LUMBAR SPECIAL TESTS:  Single leg stance test: unable to maintain balance without swaying more than 2s on Lt, 4s on right and  hip drop bil   FUNCTIONAL TESTS:  Functional squat - unable to complete without compensatory strategies (excessive forward flexion with minimal hip flexion, hands on thighs to return to standing, and decreased descent distance by 50%   GAIT: WFL  POSTURE: rounded shoulders, forward head, and posterior pelvic tilt   LUMBARAROM/PROM:  A/PROM A/PROM  eval  Flexion Limited by 75%  Extension Limited by 25%  Right lateral flexion Limited by 75%  Left lateral flexion Limited by 75%  Right rotation Limited by 75%  Left rotation Limited by 75%   (Blank rows = not tested)  LOWER EXTREMITY ROM:  Bil hamstrings, adductors and hip IR/ER limited by 50%  LOWER EXTREMITY MMT:  Bil hip abduction 3/5, all others 3+/5; knee 4+/5 PALPATION:   General: tightness at bil lumbar and thoracic paraspinals but worse at Lt side; bil piriformis and gluteal tightness but worse at Lt  Pelvic Alignment:  WFL  Abdominal: mild tightness in Rt mid/low quadrants but no TTP                External Perineal Exam: dryness, age related changes noted                             Internal Pelvic Floor: no TTP  Patient confirms identification and approves PT to assess internal pelvic floor and treatment Yes No emotional/communication barriers or cognitive limitation. Patient is motivated to learn. Patient understands and agrees with treatment goals and plan. PT explains patient will be examined in standing, sitting, and lying down to see how their muscles and joints work. When they are ready, they will be asked to remove their underwear so PT can examine their perineum. The patient is also given the option of providing their own chaperone as one is not provided in our facility. The patient also has the right and is explained the right to defer or refuse any part of the evaluation or treatment including the internal exam. With the patient's consent, PT will use one gloved finger to gently assess the muscles of the pelvic floor, seeing how well it contracts and relaxes and if there is muscle symmetry. After, the patient will get dressed and PT and patient will discuss exam findings and plan of care. PT and patient discuss plan of care, schedule, attendance policy and HEP activities.  PELVIC MMT:   MMT eval  Vaginal 1/5; 3s; 5 reps  Internal Anal Sphincter   External Anal Sphincter   Puborectalis   Diastasis Recti   (Blank rows = not tested)        TONE: Decreased   PROLAPSE: Possible grade 2 anterior wall  laxity noted with cough in hooklying   TODAY'S TREATMENT:                                                                                                                              DATE:   08/11/23 Supine pelvic floor contractions with exhale - pt reports she is unable to feels contractions consistently Hooklying opp hand/ball press 2x10 with exhale Hooklying transverse abdominis 2x10 with max  cues for coordination of breathing however difficult for pt to maintain, x10 transverse abdominis without pressure management I  Hooklying red loop 2x10 with exhale 2x10 bridges with exhale 2x10 10# Sit to stand  Farmers carry 10# 750' Rt hand only - pt reports she can't hold weight in Lt hand Educated on prolapse relief positions and handout given  08/17/23: NuStep - 5 mins L4 for improved reciprocal leg mobility and improved tissue mobility at back, PT present to discuss progress  Red loop hip abduction hooklying 2x10 Red loop hip flexion 2x10 Palloffs small power cord x10 each 2x10 Sit to stand 10# max cues for coordination breathing and pelvic floor  Trunk rotation stretch in modified standing x10 each Standing pelvic tilts x10 - max cues for technique  08/24/23: 3x10 bridges body weight Hooklying pelvic floor contractions with exhale - felt contractions moderately - directed to transfer to sitting and given towel roll to attempt to improve awareness of pelvic floor 3x10 completed here and reports improved ability to feel kegels Seated red band bil shoulder horizontal abduction with exhale 2x10 10# 2x10 Sit to stand  Small power cord x10 palloffs standing Small power cord 2x10 bil shoulder extension with transverse abdominis activation Seated on yoga ball alt marching 2x10 with transverse abdominis activation, x10 alt shoulder flexion, x10 combined alt march/shoulder flexion Nustep 5 mins L4 PT present to discuss improvement in pt symptoms   PATIENT EDUCATION:  Education details: [id number] Person educated: Patient Education method: Explanation, Demonstration, Tactile cues, Verbal cues, and Handouts Education comprehension: verbalized understanding, returned demonstration, verbal cues required, tactile cues required, and needs further education  HOME EXERCISE PROGRAM: [id number]  ASSESSMENT:  CLINICAL IMPRESSION: Patient is a 73 y.o. female  who was seen today for physical  therapy evaluation and treatment for prolapse, back pain, urgency with urine. Pt session focused on coordination of pelvic floor and breathing with exercises. Pt benefited from mod cues for techniques throughout, denied pressure/heaviness throughout. Tolerated increased challenge of exercise well today with more standing and resistance and demonstrated greatly improved back pain with transverse abdominis activation and decreased anterior pelvic tilt. Towel roll helped with awareness of pelvic floor today. Pt reports no back pain at end of session and no pelvic pressure. Pt would benefit from additional PT to further address deficits.    OBJECTIVE IMPAIRMENTS: decreased activity tolerance, decreased coordination, decreased endurance, decreased mobility, decreased ROM, decreased strength, increased fascial restrictions, increased muscle spasms, impaired flexibility, improper body mechanics, postural dysfunction, and pain.   ACTIVITY  LIMITATIONS: carrying, lifting, standing, squatting, continence, and locomotion level  PARTICIPATION LIMITATIONS: interpersonal relationship and community activity  PERSONAL FACTORS: Age, Fitness, Time since onset of injury/illness/exacerbation, and 1 comorbidity: medical history  are also affecting patient's functional outcome.   REHAB POTENTIAL: Good  CLINICAL DECISION MAKING: Stable/uncomplicated  EVALUATION COMPLEXITY: Low   GOALS: Goals reviewed with patient? Yes  SHORT TERM GOALS: Target date: 09/06/23  Pt to be I with HEP.  Baseline: Goal status: INITIAL  2.  Pt to demonstrate improved coordination of pelvic floor and breathing mechanics with body weight squat with appropriate synergistic patterns to decrease pain and leakage at least 50% of the time.    Baseline:  Goal status: INITIAL  3.  Pt to demonstrate improved hip strengthening to at least 4/5 grossly at bil hips for improved pelvic stability and decreased strain at prolapse  Baseline:  Goal  status: INITIAL  4.  Pt to demonstrate improved upright posture for at least 30 mins for decreased strain at pelvic floor and improved core/back strength.  Baseline:  Goal status: INITIAL   LONG TERM GOALS: Target date: 11/08/23  Pt to be I with advanced HEP.  Baseline:  Goal status: INITIAL  2.  Pt to demonstrate at least 4+/5 bil hip strength for improved pelvic stability and functional squats without increased pressure at pelvic floor .  Baseline:  Goal status: INITIAL  3. Pt be I with voiding mechanics for improved bladder evacuation.  Baseline:  Goal status: INITIAL  4.  Pt to report no urinary incontinence with urgency for improved tolerance to community outings.  Baseline:  Goal status: INITIAL  5.  Pt to demonstrate no restrictions in lumbar spine for decreased strain at prolapse.  Baseline:  Goal status: INITIAL  6.  Pt to demonstrate improved coordination of pelvic floor and breathing mechanics with 10# squat with appropriate synergistic patterns to decrease pain and leakage at least 75% of the time.    Baseline:  Goal status: INITIAL  PLAN:  PT FREQUENCY: 1-2x/week  PT DURATION:  15 sessions  PLANNED INTERVENTIONS: 97110-Therapeutic exercises, 97530- Therapeutic activity, 97112- Neuromuscular re-education, 97535- Self Care, [id number]- Manual therapy, Patient/Family education, Taping, Dry Needling, Joint mobilization, Spinal mobilization, Scar mobilization, DME instructions, Cryotherapy, Moist heat, and Biofeedback  PLAN FOR NEXT SESSION: trunk and hip stretching and core/hip strengthening, pressure management   Avie Lemme, PT, DPT 08/23/2508:13 AM

## 2023-08-29 NOTE — Progress Notes (Addendum)
 08/29/2023 [name] [id number] Jul 18, 1950   Chief Complaint: Discuss scheduling a colonoscopy and upper endoscopy  History of Present Illness: Christina Deleon is a 73 year old female with a past medical history of hypertension, hyperlipidemia, asthma, coronary artery calcifications, fibromyalgia, B12 deficiency, atrophic gastritis, GERD, IBS-D and colon polyps. Previously followed by Dr. Sandrea Deleon. She presents today to discuss scheduling an EGD and colonoscopy. She endorses having epigastric ain for the past few months, stomach feels raw. She typically does not have heartburn but noted having heartburn for a few days. 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 No evidence of H. Pylori. Dr. Sandrea Deleon recommended repeating an EGD in 3 years. She infrequently takes Diclofenac  for neck pain. Her bowel pattern has changed, stools soft, more frequent and sometimes sees nonbloody mucous on the stool. However, this past Fri, Sat and Sunday she did not pass a BM then passed a BM today. The shape of her stools have also changed, more narrow and stringy. She takes Dicyclomine  for IBS as needed, RX recently refilled by Dr. Elvin Deleon. Her most recent colonoscopy was 09/17/2020 which identified one 12 mm and three 5 - 6 mm tubular adenomatous polyps removed from the colon. A repeat colonoscopy in 3 years was recommended.  Addendum: Labs from PCP dated 07/08/2023: WBC 4.9.  Hemoglobin 12.7.  Hematocrit 37.9.  MCV 89.5.  Platelet 287.  Glucose 94.  BUN 16.  Creatinine 0.92.  Sodium 143.  Potassium 3.9.  Albumin 4.0.  Total bili 0.4.  Alk phos 88.  AST 16.  ALT 9.  TSH 3.23.  Vitamin B12 > 1500.  Vitamin D  34.3.     Latest Ref Rng & Units 12/08/2020    8:55 AM 08/05/2020   10:00 AM 12/30/2019    2:59 PM  CBC  WBC 3.4 - 10.8  x10E3/uL 4.1  4.5  6.2   Hemoglobin 11.1 - 15.9 g/dL 40.9  81.1  91.4   Hematocrit 34.0 - 46.6 % 37.2  40.1  41.8   Platelets 150 - 450 x10E3/uL 262  344  241     B12 level 91 on 05/07/2022     Latest Ref Rng & Units 12/08/2020    8:55 AM 11/12/2020    9:56 AM 08/05/2020   10:00 AM  CMP  Glucose 65 - 99 mg/dL 782  84  78   BUN 8 - 27 mg/dL 15  14  15    Creatinine 0.57 - 1.00 mg/dL 9.56  2.13  0.86   Sodium 134 - 144 mmol/L 144  143  143   Potassium 3.5 - 5.2 mmol/L 4.1  3.9  4.7   Chloride 96 - 106 mmol/L 107  108  105   CO2 20 - 29 mmol/L 25  28  30    Calcium  8.7 - 10.3 mg/dL 9.2  9.2  9.2   Total Protein 6.0 - 8.5 g/dL 6.5  6.4  6.4   Total Bilirubin 0.0 - 1.2 mg/dL <5.7  0.3  0.4   Alkaline Phos 44 - 121 IU/L 77     AST 0 - 40 IU/L 20  16  18    ALT 0 - 32 IU/L 13  12  17    Parietal cell antibody positive and intrinsic factor antibody negative on 08/18/2022 (Mildly elevated gastrin can be due to PPI use or  atrophic gastritis among other conditions)  EGD 07/15/2022: - Benign-appearing esophageal stenosis. Dilated.  - Otherwise normal appearing esophagus. Biopsied.  - Small hiatal hernia. - Gastric mucosal atrophy. Biopsied.  - Normal duodenal bulb and second portion of the duodenum. 1. Surgical [P], gastric antrum and gastric body - CHRONIC INACTIVE GASTRITIS, WITH ATROPHY, INTESTINAL METAPLASIA, AND NEUROENDOCRINE CELL HYPERPLASIA (SEE NOTE) - NEGATIVE FOR H.PYLORI ON IMMUNOHISTOCHEMICAL STAIN - NEGATIVE FOR DYSPLASIA OR MALIGNANCY 2. Surgical [P], esophagus - BENIGN SQUAMOUS MUCOSA WITH MILD REFLUX CHANGES - NEGATIVE FOR INTESTINAL METAPLASIA, DYSPLASIA OR MALIGNANCY Diagnosis Note 1. Histologic sections of the stomach biopsies show antral type mucosa with an absence of oxyntic glands. There is an associated basal lymphoplasmacytic infiltrate and intestinal metaplasia. Together, these findings are suggestive of autoimmune gastritis (sometimes also referred to as autoimmune  metaplastic atrophic gastritis). An immunohistochemical stain for Helicobacter organisms is negative. 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 Correlation with clinical and laboratory findings is recommended.  Colonoscopy 09/17/2020: - One 12 mm polyp in the ascending colon, removed with a cold snare. Resected and retrieved.  - Three 5 to 6 mm polyps in the descending colon and in the ascending colon, removed with a cold snare. Resected and retrieved.  - Mild diverticulosis in the sigmoid colon.  - Internal hemorrhoids.  - The examination was otherwise normal on direct and retroflexion views. 3 year recall colonoscopy  Surgical [P], colon, ascending, descending, polyp (4) - TUBULAR ADENOMA (X4). - NEGATIVE FOR HIGH GRADE DYSPLASIA.  Past Medical History:  Diagnosis Date   Adenomatous colon polyp 10/1997, and 03/2009   Allergic rhinitis    Allergy     Aortic atherosclerosis (HCC)    Arthritis    osteo   Asthma    B12 deficiency    Chronic gastritis    Coronary artery calcification seen on CT scan    Fibromyalgia    GERD (gastroesophageal reflux disease)    Hemorrhoids    Hyperlipidemia    Hypertension    Menorrhagia    partial hysterectomy   Migraines    Osteoarthritis    PONV (postoperative nausea and vomiting)    Premature atrial contractions    Sleep disturbance    STD (sexually transmitted disease) 01/22/11 culture proven   HSV type I labia   Past Surgical History:  Procedure Laterality Date   CATARACT EXTRACTION Bilateral 10/16 & 11/16   EXCISION METACARPAL MASS Right 11/16/2016   Procedure: RIGHT LITTLE FINGER CYST EXCISION;  Surgeon: Christina Chimera, MD;  Location: [facility];  Service: Orthopedics;  Laterality: Right;   TONSILLECTOMY  age 65   tooth extract     with bone graft   Turbinate sinus surgery  2003   UPPER GI ENDOSCOPY  07/2022   VAGINAL HYSTERECTOMY  1998   secondary to prolapse, ovaries remain    Current  Outpatient Medications on File Prior to Visit  Medication Sig Dispense Refill   ADVAIR  HFA 115-21 MCG/ACT inhaler INHALE 2 PUFFS BY MOUTH 1 TO 2 TIMES PER DAY 12 g 5   albuterol  (VENTOLIN  HFA) 108 (90 Base) MCG/ACT inhaler INHALE 2 PUFFS INTO THE LUNGS EVERY 6 HOURS AS NEEDED FOR WHEEZING OR SHORTNESS OF BREATH 6.7 g 1   ALPRAZolam (XANAX) 0.5 MG tablet as needed.     Ascorbic Acid (VITAMIN C PO) Take 1 tablet by mouth daily.     Azelastine -Fluticasone  137-50 MCG/ACT SUSP SHAKE LIQUID AND USE ONE SPRAY IN EACH NOSTRIL ONE TO TWO TIMES DAILY. 23 g  5   CALCIUM  PO Take 1 tablet by mouth daily.     candesartan (ATACAND) 16 MG tablet Take 8 mg by mouth at bedtime.     cetirizine  (ZYRTEC ) 10 MG tablet Take 1 tablet (10 mg total) by mouth daily. One tab daily for allergies 30 tablet 1   Cholecalciferol (VITAMIN D ) 2000 units tablet Take 2,000 Units by mouth daily.     clotrimazole  (MYCELEX ) 10 MG troche CAN USE 1 TROCHE IN MOUTH ONCE DAILY AS NEEDED 30 Troche 5   Cyanocobalamin (B-12 COMPLIANCE INJECTION IJ) Inject as directed. Once monthly     diclofenac  (CATAFLAM ) 50 MG tablet Take 1 tablet (50 mg total) by mouth 2 (two) times daily. 180 tablet 4   dicyclomine  (BENTYL ) 10 MG capsule Take 1 capsule (10 mg total) by mouth 3 (three) times daily as needed for spasms. 90 capsule 4   famotidine  (PEPCID ) 40 MG tablet Can take one tablet one to two times daily as directed. 180 tablet 1   hydrochlorothiazide (HYDRODIURIL) 12.5 MG tablet      ipratropium-albuterol  (DUONEB) 0.5-2.5 (3) MG/3ML SOLN Use one vial in the nebulizer every 4-6 hours if needed for cough or wheeze. 300 mL 1   Lifitegrast 5 % SOLN Apply 1 drop to eye 2 (two) times daily.     methocarbamol  (ROBAXIN ) 500 MG tablet TAKE 1 TABLET BY MOUTH DAILY AS NEEDED FOR MUSCLE SPASMS 30 tablet 2   montelukast  (SINGULAIR ) 10 MG tablet TAKE 1 TABLET(10 MG) BY MOUTH DAILY 90 tablet 1   Multiple Vitamin (MULTI-VITAMIN) tablet Take by mouth.      rosuvastatin  (CRESTOR ) 20 MG tablet TAKE 1 TABLET(20 MG) BY MOUTH DAILY 90 tablet 2   VITAMIN A PO Take by mouth daily.     zolmitriptan  (ZOMIG -ZMT) 5 MG disintegrating tablet Take 1 tablet (5 mg total) by mouth as needed for migraine. 10 tablet 11   zolpidem  (AMBIEN ) 5 MG tablet TAKE 1 TABLET(5 MG) BY MOUTH AT BEDTIME AS NEEDED FOR SLEEP 30 tablet 0   No current facility-administered medications on file prior to visit.   Allergies  Allergen Reactions   Trazodone Hcl Other (See Comments)    REACTION: bad headaches   Clindamycin /Lincomycin Other (See Comments)    headaches   Penicillins Nausea Only and Rash    Patient has taken Amoxicillin  without an issue, per patient. Has patient had a PCN reaction causing immediate rash, facial/tongue/throat swelling, SOB or lightheadedness with hypotension: No Has patient had a PCN reaction causing severe rash involving mucus membranes or skin necrosis: No Has patient had a PCN reaction that required hospitalization No Has patient had a PCN reaction occurring within the last 10 years: No If all of the above answers are NO, then may proceed with Cephalosporin use.     Current Medications, Allergies, Past Medical History, Past Surgical History, Family History and Social History were reviewed in [employer] record.  Review of Systems:   Constitutional: Negative for fever, sweats, chills or weight loss.  Respiratory: Negative for shortness of breath.   Cardiovascular: Negative for chest pain, palpitations and leg swelling.  Gastrointestinal: See HPI.  Musculoskeletal:+ Neck pain. Neurological: Negative for dizziness, headaches or paresthesias.   Physical Exam: BP 122/68   Pulse 70   Ht 5' 3 (1.6 m)   Wt 154 lb (69.9 kg)   LMP 07/02/1994   BMI 27.28 kg/m   General: 73 year old female, appears younger than stated age, in no acute distress.  Head: Normocephalic and atraumatic. Eyes: No scleral icterus. Conjunctiva pink  . Ears: Normal auditory acuity. Mouth: Dentition intact. No ulcers or lesions.  Lungs: Clear throughout to auscultation. Heart: Regular rate and rhythm, no murmur. Abdomen: Soft, nontender and nondistended. No masses or hepatomegaly. Normal bowel sounds x 4 quadrants.  Rectal: Deferred.  Musculoskeletal: Symmetrical with no gross deformities. Extremities: No edema. Neurological: Alert oriented x 4. No focal deficits.  Psychological: Alert and cooperative. 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 Epigastric pain x 2 months. Infrequently takes Diclofenac . Patient wishes to undergo EGD at time of colonoscopy.  -EGD to reassess autoimmune gastritis and intestinal metaplasia and rule out PUD, benefits and risks discussed including risk with sedation, risk of bleeding, perforation and infection. Tentatively scheduled 10/31/2023.  -Dr. Elvin Deleon to review case, to verify if ok to proceed with EGD at time of colonoscopy  -Famotidine  40mg  every day as needed  B 12 deficiency secondary to atrophic gastritis  - Patient to have routine labs including vitamin B12 level with PCP  GERD -GERD diet -See recommendations above regarding EGD   History of colon polyps. Colonoscopy was 09/17/2020 identified one 12 mm and three 5 - 6 mm tubular adenomatous polyps removed from the colon.  -Colonoscopy benefits and risks discussed including risk with sedation, risk of bleeding, perforation and infection   Altered bowel pattern -Benefiber one tablespoon daily -Colonoscopy as ordered above

## 2023-08-30 ENCOUNTER — Ambulatory Visit: Admitting: Nurse Practitioner

## 2023-08-30 ENCOUNTER — Encounter: Payer: Self-pay | Admitting: Nurse Practitioner

## 2023-08-30 VITALS — BP 122/68 | HR 70 | Ht 63.0 in | Wt 154.0 lb

## 2023-08-30 DIAGNOSIS — K31A19 Gastric intestinal metaplasia without dysplasia, unspecified site: Secondary | ICD-10-CM | POA: Diagnosis not present

## 2023-08-30 DIAGNOSIS — E538 Deficiency of other specified B group vitamins: Secondary | ICD-10-CM

## 2023-08-30 DIAGNOSIS — Z860100 Personal history of colon polyps, unspecified: Secondary | ICD-10-CM

## 2023-08-30 DIAGNOSIS — K2940 Chronic atrophic gastritis without bleeding: Secondary | ICD-10-CM | POA: Diagnosis not present

## 2023-08-30 DIAGNOSIS — K219 Gastro-esophageal reflux disease without esophagitis: Secondary | ICD-10-CM

## 2023-08-30 DIAGNOSIS — R1013 Epigastric pain: Secondary | ICD-10-CM | POA: Diagnosis not present

## 2023-08-30 DIAGNOSIS — R194 Change in bowel habit: Secondary | ICD-10-CM

## 2023-08-30 DIAGNOSIS — Z860101 Personal history of adenomatous and serrated colon polyps: Secondary | ICD-10-CM

## 2023-08-30 MED ORDER — NA SULFATE-K SULFATE-MG SULF 17.5-3.13-1.6 GM/177ML PO SOLN: ORAL | 0 refills | Status: DC

## 2023-08-30 NOTE — Patient Instructions (Addendum)
 You have been scheduled for a colonoscopy. Please follow written instructions given to you at your visit today.   If you use inhalers (even only as needed), please bring them with you on the day of your procedure. __________________________________________________________________________  Christina Deleon- take 1 tablespoon daily to bulk up stool  Due to recent changes in healthcare laws, you may see the results of your imaging and laboratory studies on MyChart before your provider has had a chance to review them.  We understand that in some cases there may be results that are confusing or concerning to you. Not all laboratory results come back in the same time frame and the provider may be waiting for multiple results in order to interpret others.  Please give us  48 hours in order for your provider to thoroughly review all the results before contacting the office for clarification of your results.   Thank you for trusting me with your gastrointestinal care!   Everett Hitt, CRNP

## 2023-08-31 ENCOUNTER — Encounter: Payer: Self-pay | Admitting: Nurse Practitioner

## 2023-08-31 ENCOUNTER — Ambulatory Visit: Payer: Medicare Other | Admitting: Physical Therapy

## 2023-08-31 DIAGNOSIS — R279 Unspecified lack of coordination: Secondary | ICD-10-CM

## 2023-08-31 DIAGNOSIS — M62838 Other muscle spasm: Secondary | ICD-10-CM

## 2023-08-31 DIAGNOSIS — M6281 Muscle weakness (generalized): Secondary | ICD-10-CM

## 2023-08-31 DIAGNOSIS — R293 Abnormal posture: Secondary | ICD-10-CM

## 2023-08-31 NOTE — Therapy (Signed)
 OUTPATIENT PHYSICAL THERAPY FEMALE PELVIC TREATMENT   Patient Name: Christina Deleon MRN: [id number] DOB:[birth year], 73 y.o., female Today's Date: 08/31/2023  END OF SESSION:  PT End of Session - 08/31/23 0936     Visit Number 5    Date for PT Re-Evaluation 11/08/23    Authorization Type [employer] MCR    PT Start Time 0930    PT Stop Time 1014    PT Time Calculation (min) 44 min    Activity Tolerance Patient tolerated treatment well    Behavior During Therapy [facility] for tasks assessed/performed              Past Medical History:  Diagnosis Date   Adenomatous colon polyp 10/1997, and 03/2009   Allergic rhinitis    Allergy     Aortic atherosclerosis (HCC)    Arthritis    osteo   Asthma    B12 deficiency    Chronic gastritis    Coronary artery calcification seen on CT scan    Fibromyalgia    GERD (gastroesophageal reflux disease)    Hemorrhoids    Hyperlipidemia    Hypertension    Menorrhagia    partial hysterectomy   Migraines    Osteoarthritis    PONV (postoperative nausea and vomiting)    Premature atrial contractions    Sleep disturbance    STD (sexually transmitted disease) 01/22/11 culture proven   HSV type I labia   Past Surgical History:  Procedure Laterality Date   CATARACT EXTRACTION Bilateral 10/16 & 11/16   EXCISION METACARPAL MASS Right 11/16/2016   Procedure: RIGHT LITTLE FINGER CYST EXCISION;  Surgeon: Rober Chimera, MD;  Location: [facility];  Service: Orthopedics;  Laterality: Right;   TONSILLECTOMY  age 57   tooth extract     with bone graft   Turbinate sinus surgery  2003   UPPER GI ENDOSCOPY  07/2022   VAGINAL HYSTERECTOMY  1998   secondary to prolapse, ovaries remain   Patient Active Problem List   Diagnosis Date Noted   Well woman exam with routine gynecological exam 05/23/2023   Female genital prolapse 05/23/2023   Fracture Risk Assessment Score (FRAX) indicating greater than 3% risk for hip fracture 05/23/2023   Anxiety  05/21/2021   Dyslipidemia 05/21/2021   Dysphagia 05/21/2021   External hemorrhoids without complication 05/21/2021   Generalized anxiety disorder 05/21/2021   Hardening of the aorta (main artery of the heart) (HCC) 05/21/2021   History of hysterectomy 05/21/2021   Thyroid  nodule 05/21/2021   Kidney stone 05/21/2021   Multinodular goiter 05/21/2021   Osteopenia 05/21/2021   Pure hypercholesterolemia 05/21/2021   Sciatica 05/21/2021   Stress due to family tension 05/21/2021   Vitamin D  deficiency 05/21/2021   Cervical disc disease 05/21/2021   Unspecified thoracic, thoracolumbar and lumbosacral intervertebral disc disorder 05/21/2021   Dysosmia 12/18/2019   Cough variant asthma vs uacs  04/13/2018   Fibromyalgia 04/19/2016   Other fatigue 04/19/2016   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   Primary osteoarthritis of both knees 04/19/2016   Leg pain 01/22/2016   Restless legs syndrome 01/22/2016   Snoring 11/19/2015   Periodic limb movement sleep disorder 11/19/2015   GERD (gastroesophageal reflux disease) 10/08/2015   Mild persistent asthma 04/09/2015   LPRD (laryngopharyngeal reflux disease) 04/09/2015   Allergic rhinoconjunctivitis 04/09/2015   HEMATOCHEZIA 04/01/2010   COUGH 04/01/2010   Gastritis and gastroduodenitis 09/30/2009   NAUSEA 09/30/2009   ABDOMINAL PAIN-EPIGASTRIC 09/30/2009   DYSTHYMIC DISORDER 09/26/2009   PALPITATIONS 06/17/2009  Impingement syndrome of shoulder, left 06/04/2009   Constipation 02/05/2009   History of colonic polyps 02/05/2009   Asthma 02/04/2009   CHEST PAIN, ATYPICAL 02/04/2009   COLONIC POLYPS, BENIGN 12/18/2008   Hyperlipidemia LDL goal <70 12/18/2008   Insomnia 12/18/2008   Migraine without aura 12/18/2008   Essential hypertension 12/18/2008   Allergic rhinitis 12/18/2008   Primary osteoarthritis of both hands 12/18/2008   DJD (degenerative joint disease), cervical 12/18/2008   NECK PAIN, CHRONIC 12/18/2008   LOW BACK PAIN, CHRONIC  12/18/2008    PCP: 11/08/2023   REFERRING PROVIDER: Romaine Closs, MD   REFERRING DIAG: N81.9 (ICD-10-CM) - Female genital prolapse, unspecified type R33.9 (ICD-10-CM) - Urinary retention  THERAPY DIAG:  Muscle weakness (generalized)  Unspecified lack of coordination  Abnormal posture  Other muscle spasm  Rationale for Evaluation and Treatment: Rehabilitation  ONSET DATE: 6 months   SUBJECTIVE:                                                                                                                                                                                           SUBJECTIVE STATEMENT: Not always having pressure now, not every day but still happens. Pt reports back pain is more of her problem.    PAIN:  no pain 4/30,   Worse pain in the last week: Are you having pain? Yes NPRS scale: 5 /10 Pain location:  lt side of back worse but present bil  Pain type: soreness Pain description: dull   Aggravating factors: first getting up, mobility with first waking Relieving factors: time  PRECAUTIONS: None  RED FLAGS: None   WEIGHT BEARING RESTRICTIONS: No  FALLS:  Has patient fallen in last 6 months? No  OCCUPATION: retired   ACTIVITY LEVEL : house hold, minimal yard work   PLOF: Independent  PATIENT GOALS: to have stronger back and hips and less pain. Would like to have better tolerance to cooking and gardening for more than an hour to not have as much pain  PERTINENT HISTORY:  Fibromyalgia, Hemorrhoids, Hypertension, Osteoarthritis, VAGINAL HYSTERECTOMY, HSV type I labia, Anxiety Sexual abuse: No  BOWEL MOVEMENT: Pain with bowel movement: No Type of bowel movement:Type (Bristol Stool Scale) 6, Frequency every day, and Strain no Fully empty rectum: Yes:   Leakage: No Pads: No Fiber supplement/laxative No  URINATION: Pain with urination: No Fully empty bladder: Yes:   Stream: Strong and Weak Urgency: Yes  Frequency: not quicker than every  2 hours; night time varies on fluid intake 1-2x Leakage: none Pads: No  INTERCOURSE:  Ability to have vaginal penetration Yes  Pain with intercourse: none DrynessYes  Climax: not painful Marinoff Scale: 0/3 Lubricant - no  PREGNANCY: Vaginal deliveries 2 Tearing Yes: tearing "and they put me to sleep" Episiotomy No C-section deliveries 0 Currently pregnant No  PROLAPSE: None   OBJECTIVE:  Note: Objective measures were completed at Evaluation unless otherwise noted.  DIAGNOSTIC FINDINGS:  DG Lumbar Spine Complete 03/01/23  IMPRESSION: 1. Grade 1 anterolisthesis of L5 and S1 with slight loss of disc space height. 2. Facet hypertrophy in the lower lumbar spine.  PATIENT SURVEYS:    PFIQ-7: 37   COGNITION: Overall cognitive status: Within functional limits for tasks assessed     SENSATION: Light touch: Appears intact  LUMBAR SPECIAL TESTS:  Single leg stance test: unable to maintain balance without swaying more than 2s on Lt, 4s on right and  hip drop bil   FUNCTIONAL TESTS:  Functional squat - unable to complete without compensatory strategies (excessive forward flexion with minimal hip flexion, hands on thighs to return to standing, and decreased descent distance by 50%   GAIT: WFL  POSTURE: rounded shoulders, forward head, and posterior pelvic tilt   LUMBARAROM/PROM:  A/PROM A/PROM  eval  Flexion Limited by 75%  Extension Limited by 25%  Right lateral flexion Limited by 75%  Left lateral flexion Limited by 75%  Right rotation Limited by 75%  Left rotation Limited by 75%   (Blank rows = not tested)  LOWER EXTREMITY ROM:  Bil hamstrings, adductors and hip IR/ER limited by 50%  LOWER EXTREMITY MMT:  Bil hip abduction 3/5, all others 3+/5; knee 4+/5 PALPATION:   General: tightness at bil lumbar and thoracic paraspinals but worse at Lt side; bil piriformis and gluteal tightness but worse at Lt  Pelvic Alignment: WFL  Abdominal: mild tightness  in Rt mid/low quadrants but no TTP                External Perineal Exam: dryness, age related changes noted                             Internal Pelvic Floor: no TTP  Patient confirms identification and approves PT to assess internal pelvic floor and treatment Yes No emotional/communication barriers or cognitive limitation. Patient is motivated to learn. Patient understands and agrees with treatment goals and plan. PT explains patient will be examined in standing, sitting, and lying down to see how their muscles and joints work. When they are ready, they will be asked to remove their underwear so PT can examine their perineum. The patient is also given the option of providing their own chaperone as one is not provided in our facility. The patient also has the right and is explained the right to defer or refuse any part of the evaluation or treatment including the internal exam. With the patient's consent, PT will use one gloved finger to gently assess the muscles of the pelvic floor, seeing how well it contracts and relaxes and if there is muscle symmetry. After, the patient will get dressed and PT and patient will discuss exam findings and plan of care. PT and patient discuss plan of care, schedule, attendance policy and HEP activities.  PELVIC MMT:   MMT eval  Vaginal 1/5; 3s; 5 reps  Internal Anal Sphincter   External Anal Sphincter   Puborectalis   Diastasis Recti   (Blank rows = not tested)        TONE: Decreased   PROLAPSE: Possible grade 2 anterior wall laxity noted  with cough in hooklying   TODAY'S TREATMENT:                                                                                                                              DATE:   08/17/23: NuStep - 5 mins L4 for improved reciprocal leg mobility and improved tissue mobility at back, PT present to discuss progress  Red loop hip abduction hooklying 2x10 Red loop hip flexion 2x10 Palloffs small power cord x10 each 2x10  Sit to stand 10# max cues for coordination breathing and pelvic floor  Trunk rotation stretch in modified standing x10 each Standing pelvic tilts x10 - max cues for technique  08/24/23: 3x10 bridges body weight Hooklying pelvic floor contractions with exhale - felt contractions moderately - directed to transfer to sitting and given towel roll to attempt to improve awareness of pelvic floor 3x10 completed here and reports improved ability to feel kegels Seated red band bil shoulder horizontal abduction with exhale 2x10 10# 2x10 Sit to stand  Small power cord x10 palloffs standing Small power cord 2x10 bil shoulder extension with transverse abdominis activation Seated on yoga ball alt marching 2x10 with transverse abdominis activation, x10 alt shoulder flexion, x10 combined alt march/shoulder flexion Nustep 5 mins L4 PT present to discuss improvement in pt symptoms  08/31/23: Hooklying hip abduction +flexion 2x10 Seated red band bil shoulder horizontal abduction 2x10 Red band palloff 2x10 each Standing 5# isometric hold at chest with alt marching 2x10 Standing iso held in OHP 5# x10 10# 2x10 Sit to stand  3x10 transverse abdominis activation in hooklying with moderate cues for isolating core activation Hooklying red band bil shoulder ext 2x10 Open book x5 each side Single knee to chest 3x30s  PATIENT EDUCATION:  Education details: [id number] Person educated: Patient Education method: Explanation, Demonstration, Tactile cues, Verbal cues, and Handouts Education comprehension: verbalized understanding, returned demonstration, verbal cues required, tactile cues required, and needs further education  HOME EXERCISE PROGRAM: [id number]  ASSESSMENT:  CLINICAL IMPRESSION: Patient is a 73 y.o. female  who was seen today for physical therapy evaluation and treatment for prolapse, back pain, urgency with urine. Pt session focused on coordination of pelvic floor and breathing with exercises. Pt  benefited from mod -min cues for techniques throughout, denied pressure/heaviness throughout. Pt tolerated session well reports overall she is having less -no urinary urgency and is doing more without back pain starting now.  Pt reports no back pain at end of session and no pelvic pressure. Pt would benefit from additional PT to further address deficits.    OBJECTIVE IMPAIRMENTS: decreased activity tolerance, decreased coordination, decreased endurance, decreased mobility, decreased ROM, decreased strength, increased fascial restrictions, increased muscle spasms, impaired flexibility, improper body mechanics, postural dysfunction, and pain.   ACTIVITY LIMITATIONS: carrying, lifting, standing, squatting, continence, and locomotion level  PARTICIPATION LIMITATIONS: interpersonal relationship and community activity  PERSONAL FACTORS: Age, Fitness, Time since onset of injury/illness/exacerbation, and 1 comorbidity: medical history  are also affecting patient's functional outcome.   REHAB POTENTIAL: Good  CLINICAL DECISION MAKING: Stable/uncomplicated  EVALUATION COMPLEXITY: Low   GOALS: Goals reviewed with patient? Yes  SHORT TERM GOALS: Target date: 09/06/23  Pt to be I with HEP.  Baseline: Goal status: INITIAL  2.  Pt to demonstrate improved coordination of pelvic floor and breathing mechanics with body weight squat with appropriate synergistic patterns to decrease pain and leakage at least 50% of the time.    Baseline:  Goal status: INITIAL  3.  Pt to demonstrate improved hip strengthening to at least 4/5 grossly at bil hips for improved pelvic stability and decreased strain at prolapse  Baseline:  Goal status: INITIAL  4.  Pt to demonstrate improved upright posture for at least 30 mins for decreased strain at pelvic floor and improved core/back strength.  Baseline:  Goal status: INITIAL   LONG TERM GOALS: Target date: 11/08/23  Pt to be I with advanced HEP.  Baseline:  Goal  status: INITIAL  2.  Pt to demonstrate at least 4+/5 bil hip strength for improved pelvic stability and functional squats without increased pressure at pelvic floor .  Baseline:  Goal status: INITIAL  3. Pt be I with voiding mechanics for improved bladder evacuation.  Baseline:  Goal status: INITIAL  4.  Pt to report no urinary incontinence with urgency for improved tolerance to community outings.  Baseline:  Goal status: INITIAL  5.  Pt to demonstrate no restrictions in lumbar spine for decreased strain at prolapse.  Baseline:  Goal status: INITIAL  6.  Pt to demonstrate improved coordination of pelvic floor and breathing mechanics with 10# squat with appropriate synergistic patterns to decrease pain and leakage at least 75% of the time.    Baseline:  Goal status: INITIAL  PLAN:  PT FREQUENCY: 1-2x/week  PT DURATION:  15 sessions  PLANNED INTERVENTIONS: 97110-Therapeutic exercises, 97530- Therapeutic activity, 97112- Neuromuscular re-education, 97535- Self Care, [id number]- Manual therapy, Patient/Family education, Taping, Dry Needling, Joint mobilization, Spinal mobilization, Scar mobilization, DME instructions, Cryotherapy, Moist heat, and Biofeedback  PLAN FOR NEXT SESSION: trunk and hip stretching and core/hip strengthening, pressure management   Avie Lemme, PT, DPT 08/30/2508:17 AM

## 2023-08-31 NOTE — Progress Notes (Signed)
 DD, pls contact patient and let her know Dr. Elvin Hammer reviewed her office visit and prior procedures, patient to proceed with EGD and colonoscopy as planned. THX.

## 2023-08-31 NOTE — Progress Notes (Signed)
 Reviewed. Okay for colonoscopy and upper endoscopy concurrently

## 2023-09-01 NOTE — Progress Notes (Signed)
 Attempted to contacted patient and left a voicemail for patient to return call.

## 2023-09-05 ENCOUNTER — Ambulatory Visit: Payer: Self-pay | Attending: Obstetrics and Gynecology | Admitting: Physical Therapy

## 2023-09-05 DIAGNOSIS — R293 Abnormal posture: Secondary | ICD-10-CM | POA: Diagnosis present

## 2023-09-05 DIAGNOSIS — M6281 Muscle weakness (generalized): Secondary | ICD-10-CM | POA: Insufficient documentation

## 2023-09-05 DIAGNOSIS — R279 Unspecified lack of coordination: Secondary | ICD-10-CM | POA: Diagnosis present

## 2023-09-05 NOTE — Therapy (Signed)
 OUTPATIENT PHYSICAL THERAPY FEMALE PELVIC TREATMENT   Patient Name: Christina Deleon MRN: [id number] DOB:1950-09-22, 73 y.o., female Today's Date: 09/05/2023  END OF SESSION:  PT End of Session - 09/05/23 0935     Visit Number 6    Date for PT Re-Evaluation 11/08/23    Authorization Type [employer] MCR    PT Start Time 0933    PT Stop Time 1012    PT Time Calculation (min) 39 min    Activity Tolerance Patient tolerated treatment well    Behavior During Therapy [facility] for tasks assessed/performed              Past Medical History:  Diagnosis Date   Adenomatous colon polyp 10/1997, and 03/2009   Allergic rhinitis    Allergy     Aortic atherosclerosis (HCC)    Arthritis    osteo   Asthma    B12 deficiency    Chronic gastritis    Coronary artery calcification seen on CT scan    Fibromyalgia    GERD (gastroesophageal reflux disease)    Hemorrhoids    Hyperlipidemia    Hypertension    Menorrhagia    partial hysterectomy   Migraines    Osteoarthritis    PONV (postoperative nausea and vomiting)    Premature atrial contractions    Sleep disturbance    STD (sexually transmitted disease) 01/22/11 culture proven   HSV type I labia   Past Surgical History:  Procedure Laterality Date   CATARACT EXTRACTION Bilateral 10/16 & 11/16   EXCISION METACARPAL MASS Right 11/16/2016   Procedure: RIGHT LITTLE FINGER CYST EXCISION;  Surgeon: Rober Chimera, MD;  Location: [facility];  Service: Orthopedics;  Laterality: Right;   TONSILLECTOMY  age 49   tooth extract     with bone graft   Turbinate sinus surgery  2003   UPPER GI ENDOSCOPY  07/2022   VAGINAL HYSTERECTOMY  1998   secondary to prolapse, ovaries remain   Patient Active Problem List   Diagnosis Date Noted   Well woman exam with routine gynecological exam 05/23/2023   Female genital prolapse 05/23/2023   Fracture Risk Assessment Score (FRAX) indicating greater than 3% risk for hip fracture 05/23/2023   Anxiety  05/21/2021   Dyslipidemia 05/21/2021   Dysphagia 05/21/2021   External hemorrhoids without complication 05/21/2021   Generalized anxiety disorder 05/21/2021   Hardening of the aorta (main artery of the heart) (HCC) 05/21/2021   History of hysterectomy 05/21/2021   Thyroid  nodule 05/21/2021   Kidney stone 05/21/2021   Multinodular goiter 05/21/2021   Osteopenia 05/21/2021   Pure hypercholesterolemia 05/21/2021   Sciatica 05/21/2021   Stress due to family tension 05/21/2021   Vitamin D  deficiency 05/21/2021   Cervical disc disease 05/21/2021   Unspecified thoracic, thoracolumbar and lumbosacral intervertebral disc disorder 05/21/2021   Dysosmia 12/18/2019   Cough variant asthma vs uacs  04/13/2018   Fibromyalgia 04/19/2016   Other fatigue 04/19/2016   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   Primary osteoarthritis of both knees 04/19/2016   Leg pain 01/22/2016   Restless legs syndrome 01/22/2016   Snoring 11/19/2015   Periodic limb movement sleep disorder 11/19/2015   GERD (gastroesophageal reflux disease) 10/08/2015   Mild persistent asthma 04/09/2015   LPRD (laryngopharyngeal reflux disease) 04/09/2015   Allergic rhinoconjunctivitis 04/09/2015   HEMATOCHEZIA 04/01/2010   COUGH 04/01/2010   Gastritis and gastroduodenitis 09/30/2009   NAUSEA 09/30/2009   ABDOMINAL PAIN-EPIGASTRIC 09/30/2009   DYSTHYMIC DISORDER 09/26/2009   PALPITATIONS 06/17/2009  Impingement syndrome of shoulder, left 06/04/2009   Constipation 02/05/2009   History of colonic polyps 02/05/2009   Asthma 02/04/2009   CHEST PAIN, ATYPICAL 02/04/2009   COLONIC POLYPS, BENIGN 12/18/2008   Hyperlipidemia LDL goal <70 12/18/2008   Insomnia 12/18/2008   Migraine without aura 12/18/2008   Essential hypertension 12/18/2008   Allergic rhinitis 12/18/2008   Primary osteoarthritis of both hands 12/18/2008   DJD (degenerative joint disease), cervical 12/18/2008   NECK PAIN, CHRONIC 12/18/2008   LOW BACK PAIN, CHRONIC  12/18/2008    PCP: 11/08/2023   REFERRING PROVIDER: Romaine Closs, MD   REFERRING DIAG: N81.9 (ICD-10-CM) - Female genital prolapse, unspecified type R33.9 (ICD-10-CM) - Urinary retention  THERAPY DIAG:  Muscle weakness (generalized)  Unspecified lack of coordination  Abnormal posture  Rationale for Evaluation and Treatment: Rehabilitation  ONSET DATE: 6 months   SUBJECTIVE:                                                                                                                                                                                           SUBJECTIVE STATEMENT: Is not feeling pressure daily now, has only felt in last week with strong cough and if she has a full bladder and delays going to bathroom. Doesn't have leakage.  Back pain not all the time now, did feel it more after cleaning whole house and mopping/cleaning bathrooms.    PAIN:  no pain 09/05/23    Worse pain in the last week: Are you having pain? Yes NPRS scale: 5 /10 Pain location:  lt side of back worse but present bil  Pain type: soreness Pain description: dull   Aggravating factors: first getting up, mobility with first waking Relieving factors: time  PRECAUTIONS: None  RED FLAGS: None   WEIGHT BEARING RESTRICTIONS: No  FALLS:  Has patient fallen in last 6 months? No  OCCUPATION: retired   ACTIVITY LEVEL : house hold, minimal yard work   PLOF: Independent  PATIENT GOALS: to have stronger back and hips and less pain. Would like to have better tolerance to cooking and gardening for more than an hour to not have as much pain  PERTINENT HISTORY:  Fibromyalgia, Hemorrhoids, Hypertension, Osteoarthritis, VAGINAL HYSTERECTOMY, HSV type I labia, Anxiety Sexual abuse: No  BOWEL MOVEMENT: Pain with bowel movement: No Type of bowel movement:Type (Bristol Stool Scale) 6, Frequency every day, and Strain no Fully empty rectum: Yes:   Leakage: No Pads: No Fiber supplement/laxative  No  URINATION: Pain with urination: No Fully empty bladder: Yes:   Stream: Strong and Weak Urgency: Yes  Frequency: not quicker than every 2 hours; night  time varies on fluid intake 1-2x Leakage: none Pads: No  INTERCOURSE:  Ability to have vaginal penetration Yes  Pain with intercourse: none DrynessYes  Climax: not painful Marinoff Scale: 0/3 Lubricant - no  PREGNANCY: Vaginal deliveries 2 Tearing Yes: tearing "and they put me to sleep" Episiotomy No C-section deliveries 0 Currently pregnant No  PROLAPSE: None   OBJECTIVE:  Note: Objective measures were completed at Evaluation unless otherwise noted.  DIAGNOSTIC FINDINGS:  DG Lumbar Spine Complete 03/01/23  IMPRESSION: 1. Grade 1 anterolisthesis of L5 and S1 with slight loss of disc space height. 2. Facet hypertrophy in the lower lumbar spine.  PATIENT SURVEYS:    PFIQ-7: 71  09/05/23: PFIQ-7 19  COGNITION: Overall cognitive status: Within functional limits for tasks assessed     SENSATION: Light touch: Appears intact  LUMBAR SPECIAL TESTS:  Single leg stance test: unable to maintain balance without swaying more than 2s on Lt, 4s on right and  hip drop bil   FUNCTIONAL TESTS:  Functional squat - unable to complete without compensatory strategies (excessive forward flexion with minimal hip flexion, hands on thighs to return to standing, and decreased descent distance by 50%   GAIT: WFL  POSTURE: rounded shoulders, forward head, and posterior pelvic tilt   LUMBARAROM/PROM:  A/PROM A/PROM  eval  Flexion Limited by 75%  Extension Limited by 25%  Right lateral flexion Limited by 75%  Left lateral flexion Limited by 75%  Right rotation Limited by 75%  Left rotation Limited by 75%   (Blank rows = not tested)  LOWER EXTREMITY ROM:  Bil hamstrings, adductors and hip IR/ER limited by 50%  LOWER EXTREMITY MMT:  Bil hip abduction 3/5, all others 3+/5; knee 4+/5 PALPATION:   General: tightness at  bil lumbar and thoracic paraspinals but worse at Lt side; bil piriformis and gluteal tightness but worse at Lt  Pelvic Alignment: WFL  Abdominal: mild tightness in Rt mid/low quadrants but no TTP                External Perineal Exam: dryness, age related changes noted                             Internal Pelvic Floor: no TTP  Patient confirms identification and approves PT to assess internal pelvic floor and treatment Yes No emotional/communication barriers or cognitive limitation. Patient is motivated to learn. Patient understands and agrees with treatment goals and plan. PT explains patient will be examined in standing, sitting, and lying down to see how their muscles and joints work. When they are ready, they will be asked to remove their underwear so PT can examine their perineum. The patient is also given the option of providing their own chaperone as one is not provided in our facility. The patient also has the right and is explained the right to defer or refuse any part of the evaluation or treatment including the internal exam. With the patient's consent, PT will use one gloved finger to gently assess the muscles of the pelvic floor, seeing how well it contracts and relaxes and if there is muscle symmetry. After, the patient will get dressed and PT and patient will discuss exam findings and plan of care. PT and patient discuss plan of care, schedule, attendance policy and HEP activities.  PELVIC MMT:   MMT eval  Vaginal 1/5; 3s; 5 reps  Internal Anal Sphincter   External Anal Sphincter   Puborectalis  Diastasis Recti   (Blank rows = not tested)        TONE: Decreased   PROLAPSE: Possible grade 2 anterior wall laxity noted with cough in hooklying   TODAY'S TREATMENT:                                                                                                                              DATE:   08/24/23: 3x10 bridges body weight Hooklying pelvic floor contractions with  exhale - felt contractions moderately - directed to transfer to sitting and given towel roll to attempt to improve awareness of pelvic floor 3x10 completed here and reports improved ability to feel kegels Seated red band bil shoulder horizontal abduction with exhale 2x10 10# 2x10 Sit to stand  Small power cord x10 palloffs standing Small power cord 2x10 bil shoulder extension with transverse abdominis activation Seated on yoga ball alt marching 2x10 with transverse abdominis activation, x10 alt shoulder flexion, x10 combined alt march/shoulder flexion Nustep 5 mins L4 PT present to discuss improvement in pt symptoms  08/31/23: Hooklying hip abduction +flexion 2x10 Seated red band bil shoulder horizontal abduction 2x10 Red band palloff 2x10 each Standing 5# isometric hold at chest with alt marching 2x10 Standing iso held in [facility] 5# x10 10# 2x10 Sit to stand  3x10 transverse abdominis activation in hooklying with moderate cues for isolating core activation Hooklying red band bil shoulder ext 2x10 Open book x5 each side Single knee to chest 3x30s  09/05/23: Nustep 5 mins L7 - PT present to discuss progress Pelvic floor contraction + cough 2x10 (second set slightly harder cough) Bridges 2x10 7#  Quad needle threaders and full opening x10 Seated 4# anti rotation bil 2x10 4# mario punches x10 Pelvic propping 4 mins with diaphragmatic breathing and educated on continuing this at home in the evening oras needed during day for ~10-15 mins as long as no symptoms (pain, discomfort, dizziness, or other adverse reactions). Pt demonstrated and verbalized understanding.   PATIENT EDUCATION:  Education details: [id number] Person educated: Patient Education method: Explanation, Demonstration, Tactile cues, Verbal cues, and Handouts Education comprehension: verbalized understanding, returned demonstration, verbal cues required, tactile cues required, and needs further education  HOME EXERCISE  PROGRAM: [phone]  ASSESSMENT:  CLINICAL IMPRESSION: Patient is a 73 y.o. female  who was seen today for physical therapy evaluation and treatment for prolapse, back pain, urgency with urine. Pt session focused on coordination of pelvic floor and breathing with exercises. Pt benefited from min cues for techniques throughout, reported mild pressure/heaviness with weighted mario punches then into weighted squats however with pelvic props resolved. Pt tolerated session well reports overall she is having less -no urinary urgency and is doing more without back pain now. Also PFIQ-7 at much lower score indicating progress Pt reports no back pain at end of session and no pelvic pressure. Pt would benefit from additional PT to further address deficits.    OBJECTIVE IMPAIRMENTS: decreased activity tolerance, decreased  coordination, decreased endurance, decreased mobility, decreased ROM, decreased strength, increased fascial restrictions, increased muscle spasms, impaired flexibility, improper body mechanics, postural dysfunction, and pain.   ACTIVITY LIMITATIONS: carrying, lifting, standing, squatting, continence, and locomotion level  PARTICIPATION LIMITATIONS: interpersonal relationship and community activity  PERSONAL FACTORS: Age, Fitness, Time since onset of injury/illness/exacerbation, and 1 comorbidity: medical history  are also affecting patient's functional outcome.   REHAB POTENTIAL: Good  CLINICAL DECISION MAKING: Stable/uncomplicated  EVALUATION COMPLEXITY: Low   GOALS: Goals reviewed with patient? Yes  SHORT TERM GOALS: Target date: 09/06/23 *goals updated 09/05/23 Pt to be I with HEP.  Baseline: Goal status: MET  2.  Pt to demonstrate improved coordination of pelvic floor and breathing mechanics with body weight squat with appropriate synergistic patterns to decrease pain and leakage at least 50% of the time.    Baseline:  Goal status: MET  3.  Pt to demonstrate improved hip  strengthening to at least 4/5 grossly at bil hips for improved pelvic stability and decreased strain at prolapse  Baseline:  Goal status: on going - pt has deferred internal but we do plan to reassess in 2 visits  4.  Pt to demonstrate improved upright posture for at least 30 mins for decreased strain at pelvic floor and improved core/back strength.  Baseline:  Goal status: MET   LONG TERM GOALS: Target date: 11/08/23  Pt to be I with advanced HEP.  Baseline:  Goal status: on going   2.  Pt to demonstrate at least 4+/5 bil hip strength for improved pelvic stability and functional squats without increased pressure at pelvic floor .  Baseline:  Goal status: on going   3. Pt be I with voiding mechanics for improved bladder evacuation.  Baseline:  Goal status: on going   4.  Pt to report no urinary incontinence with urgency for improved tolerance to community outings.  Baseline:  Goal status: MET  5.  Pt to demonstrate no restrictions in lumbar spine for decreased strain at prolapse.  Baseline:  Goal status: on going   6.  Pt to demonstrate improved coordination of pelvic floor and breathing mechanics with 10# squat with appropriate synergistic patterns to decrease pain and leakage at least 75% of the time.    Baseline:  Goal status: on going   PLAN:  PT FREQUENCY: 1-2x/week  PT DURATION:  15 sessions  PLANNED INTERVENTIONS: 97110-Therapeutic exercises, 97530- Therapeutic activity, 97112- Neuromuscular re-education, 97535- Self Care, 16109- Manual therapy, Patient/Family education, Taping, Dry Needling, Joint mobilization, Spinal mobilization, Scar mobilization, DME instructions, Cryotherapy, Moist heat, and Biofeedback  PLAN FOR NEXT SESSION: trunk and hip stretching and core/hip strengthening, pressure management   Avie Lemme, PT, DPT 09/04/2509:01 AM

## 2023-09-12 ENCOUNTER — Ambulatory Visit: Payer: Self-pay | Admitting: Physical Therapy

## 2023-09-12 DIAGNOSIS — M6281 Muscle weakness (generalized): Secondary | ICD-10-CM

## 2023-09-12 DIAGNOSIS — R293 Abnormal posture: Secondary | ICD-10-CM

## 2023-09-12 DIAGNOSIS — R279 Unspecified lack of coordination: Secondary | ICD-10-CM

## 2023-09-12 NOTE — Therapy (Signed)
 OUTPATIENT PHYSICAL THERAPY FEMALE PELVIC TREATMENT   Patient Name: Christina Deleon MRN: [id number] DOB:04-21-1951, 73 y.o., female Today's Date: 09/12/2023  END OF SESSION:  PT End of Session - 09/12/23 1114     Visit Number 7    Date for PT Re-Evaluation 11/08/23    Authorization Type [employer] MCR    PT Start Time 1100    PT Stop Time 1142    PT Time Calculation (min) 42 min    Activity Tolerance Patient tolerated treatment well    Behavior During Therapy WFL for tasks assessed/performed              Past Medical History:  Diagnosis Date   Adenomatous colon polyp 10/1997, and 03/2009   Allergic rhinitis    Allergy     Aortic atherosclerosis (HCC)    Arthritis    osteo   Asthma    B12 deficiency    Chronic gastritis    Coronary artery calcification seen on CT scan    Fibromyalgia    GERD (gastroesophageal reflux disease)    Hemorrhoids    Hyperlipidemia    Hypertension    Menorrhagia    partial hysterectomy   Migraines    Osteoarthritis    PONV (postoperative nausea and vomiting)    Premature atrial contractions    Sleep disturbance    STD (sexually transmitted disease) 01/22/11 culture proven   HSV type I labia   Past Surgical History:  Procedure Laterality Date   CATARACT EXTRACTION Bilateral 10/16 & 11/16   EXCISION METACARPAL MASS Right 11/16/2016   Procedure: RIGHT LITTLE FINGER CYST EXCISION;  Surgeon: Rober Chimera, MD;  Location: [facility];  Service: Orthopedics;  Laterality: Right;   TONSILLECTOMY  age 37   tooth extract     with bone graft   Turbinate sinus surgery  2003   UPPER GI ENDOSCOPY  07/2022   VAGINAL HYSTERECTOMY  1998   secondary to prolapse, ovaries remain   Patient Active Problem List   Diagnosis Date Noted   Well woman exam with routine gynecological exam 05/23/2023   Female genital prolapse 05/23/2023   Fracture Risk Assessment Score (FRAX) indicating greater than 3% risk for hip fracture 05/23/2023   Anxiety  05/21/2021   Dyslipidemia 05/21/2021   Dysphagia 05/21/2021   External hemorrhoids without complication 05/21/2021   Generalized anxiety disorder 05/21/2021   Hardening of the aorta (main artery of the heart) (HCC) 05/21/2021   History of hysterectomy 05/21/2021   Thyroid  nodule 05/21/2021   Kidney stone 05/21/2021   Multinodular goiter 05/21/2021   Osteopenia 05/21/2021   Pure hypercholesterolemia 05/21/2021   Sciatica 05/21/2021   Stress due to family tension 05/21/2021   Vitamin D  deficiency 05/21/2021   Cervical disc disease 05/21/2021   Unspecified thoracic, thoracolumbar and lumbosacral intervertebral disc disorder 05/21/2021   Dysosmia 12/18/2019   Cough variant asthma vs uacs  04/13/2018   Fibromyalgia 04/19/2016   Other fatigue 04/19/2016   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   Primary osteoarthritis of both knees 04/19/2016   Leg pain 01/22/2016   Restless legs syndrome 01/22/2016   Snoring 11/19/2015   Periodic limb movement sleep disorder 11/19/2015   GERD (gastroesophageal reflux disease) 10/08/2015   Mild persistent asthma 04/09/2015   LPRD (laryngopharyngeal reflux disease) 04/09/2015   Allergic rhinoconjunctivitis 04/09/2015   HEMATOCHEZIA 04/01/2010   COUGH 04/01/2010   Gastritis and gastroduodenitis 09/30/2009   NAUSEA 09/30/2009   ABDOMINAL PAIN-EPIGASTRIC 09/30/2009   DYSTHYMIC DISORDER 09/26/2009   PALPITATIONS 06/17/2009  Impingement syndrome of shoulder, left 06/04/2009   Constipation 02/05/2009   History of colonic polyps 02/05/2009   Asthma 02/04/2009   CHEST PAIN, ATYPICAL 02/04/2009   COLONIC POLYPS, BENIGN 12/18/2008   Hyperlipidemia LDL goal <70 12/18/2008   Insomnia 12/18/2008   Migraine without aura 12/18/2008   Essential hypertension 12/18/2008   Allergic rhinitis 12/18/2008   Primary osteoarthritis of both hands 12/18/2008   DJD (degenerative joint disease), cervical 12/18/2008   NECK PAIN, CHRONIC 12/18/2008   LOW BACK PAIN, CHRONIC  12/18/2008    PCP: 11/08/2023   REFERRING PROVIDER: Romaine Closs, MD   REFERRING DIAG: N81.9 (ICD-10-CM) - Female genital prolapse, unspecified type R33.9 (ICD-10-CM) - Urinary retention  THERAPY DIAG:  Muscle weakness (generalized)  Unspecified lack of coordination  Abnormal posture  Rationale for Evaluation and Treatment: Rehabilitation  ONSET DATE: 6 months   SUBJECTIVE:                                                                                                                                                                                           SUBJECTIVE STATEMENT: Reports she is pleased with progressed noticed she was able to get up and down and bend a lot easier. Didn't have pelvic pressure after yard work this weekend.    PAIN:  09/12/23    Worse pain in the last week: Are you having pain? Yes NPRS scale: 3/10 Pain location: lt side of back worse but present bil  Pain type: soreness Pain description: dull   Aggravating factors: first getting up, mobility with first waking Relieving factors: time  PRECAUTIONS: None  RED FLAGS: None   WEIGHT BEARING RESTRICTIONS: No  FALLS:  Has patient fallen in last 6 months? No  OCCUPATION: retired   ACTIVITY LEVEL : house hold, minimal yard work   PLOF: Independent  PATIENT GOALS: to have stronger back and hips and less pain. Would like to have better tolerance to cooking and gardening for more than an hour to not have as much pain  PERTINENT HISTORY:  Fibromyalgia, Hemorrhoids, Hypertension, Osteoarthritis, VAGINAL HYSTERECTOMY, HSV type I labia, Anxiety Sexual abuse: No  BOWEL MOVEMENT: Pain with bowel movement: No Type of bowel movement:Type (Bristol Stool Scale) 6, Frequency every day, and Strain no Fully empty rectum: Yes:   Leakage: No Pads: No Fiber supplement/laxative No  URINATION: Pain with urination: No Fully empty bladder: Yes:   Stream: Strong and Weak Urgency: Yes  Frequency:  not quicker than every 2 hours; night time varies on fluid intake 1-2x Leakage: none Pads: No  INTERCOURSE:  Ability to have vaginal penetration Yes  Pain with intercourse: none  DrynessYes  Climax: not painful Marinoff Scale: 0/3 Lubricant - no  PREGNANCY: Vaginal deliveries 2 Tearing Yes: tearing "and they put me to sleep" Episiotomy No C-section deliveries 0 Currently pregnant No  PROLAPSE: None   OBJECTIVE:  Note: Objective measures were completed at Evaluation unless otherwise noted.  DIAGNOSTIC FINDINGS:  DG Lumbar Spine Complete 03/01/23  IMPRESSION: 1. Grade 1 anterolisthesis of L5 and S1 with slight loss of disc space height. 2. Facet hypertrophy in the lower lumbar spine.  PATIENT SURVEYS:    PFIQ-7: 71  09/05/23: PFIQ-7 19  COGNITION: Overall cognitive status: Within functional limits for tasks assessed     SENSATION: Light touch: Appears intact  LUMBAR SPECIAL TESTS:  Single leg stance test: unable to maintain balance without swaying more than 2s on Lt, 4s on right and  hip drop bil   FUNCTIONAL TESTS:  Functional squat - unable to complete without compensatory strategies (excessive forward flexion with minimal hip flexion, hands on thighs to return to standing, and decreased descent distance by 50%   GAIT: WFL  POSTURE: rounded shoulders, forward head, and posterior pelvic tilt   LUMBARAROM/PROM:  A/PROM A/PROM  eval  Flexion Limited by 75%  Extension Limited by 25%  Right lateral flexion Limited by 75%  Left lateral flexion Limited by 75%  Right rotation Limited by 75%  Left rotation Limited by 75%   (Blank rows = not tested)  LOWER EXTREMITY ROM:  Bil hamstrings, adductors and hip IR/ER limited by 50%  LOWER EXTREMITY MMT:  Bil hip abduction 3/5, all others 3+/5; knee 4+/5 PALPATION:   General: tightness at bil lumbar and thoracic paraspinals but worse at Lt side; bil piriformis and gluteal tightness but worse at Lt  Pelvic  Alignment: WFL  Abdominal: mild tightness in Rt mid/low quadrants but no TTP                External Perineal Exam: dryness, age related changes noted                             Internal Pelvic Floor: no TTP  Patient confirms identification and approves PT to assess internal pelvic floor and treatment Yes No emotional/communication barriers or cognitive limitation. Patient is motivated to learn. Patient understands and agrees with treatment goals and plan. PT explains patient will be examined in standing, sitting, and lying down to see how their muscles and joints work. When they are ready, they will be asked to remove their underwear so PT can examine their perineum. The patient is also given the option of providing their own chaperone as one is not provided in our facility. The patient also has the right and is explained the right to defer or refuse any part of the evaluation or treatment including the internal exam. With the patient's consent, PT will use one gloved finger to gently assess the muscles of the pelvic floor, seeing how well it contracts and relaxes and if there is muscle symmetry. After, the patient will get dressed and PT and patient will discuss exam findings and plan of care. PT and patient discuss plan of care, schedule, attendance policy and HEP activities.  PELVIC MMT:   MMT eval  Vaginal 1/5; 3s; 5 reps  Internal Anal Sphincter   External Anal Sphincter   Puborectalis   Diastasis Recti   (Blank rows = not tested)        TONE: Decreased   PROLAPSE: Possible grade  2 anterior wall laxity noted with cough in hooklying   TODAY'S TREATMENT:                                                                                                                              DATE:    08/31/23: Hooklying hip abduction +flexion 2x10 Seated red band bil shoulder horizontal abduction 2x10 Red band palloff 2x10 each Standing 5# isometric hold at chest with alt marching  2x10 Standing iso held in [facility] 5# x10 10# 2x10 Sit to stand  3x10 transverse abdominis activation in hooklying with moderate cues for isolating core activation Hooklying red band bil shoulder ext 2x10 Open book x5 each side Single knee to chest 3x30s  09/05/23: Nustep 5 mins L7 - PT present to discuss progress Pelvic floor contraction + cough 2x10 (second set slightly harder cough) Bridges 2x10 7#  Quad needle threaders and full opening x10 Seated 4# anti rotation bil 2x10 4# mario punches x10 Pelvic propping 4 mins with diaphragmatic breathing and educated on continuing this at home in the evening oras needed during day for ~10-15 mins as long as no symptoms (pain, discomfort, dizziness, or other adverse reactions). Pt demonstrated and verbalized understanding.   09/12/23: 2x10 Sit to stand 10#+5#  Hooklying bil shoulder horizontal abduction green band 2x10 with transverse abdominis activation Opp hand/knee ball press alt 2x10 Standing lateral crunch 10# 2x10 each Bosu - forward weight shifts 2x10 each, mini step ups x10 each, 30s static standing - min A 5# 2x10 mario punches Farmers carry 10# 1000' each hand Wedge pelvic prop 3 mins end of session to limit any pelvic pressure. Pt denied pressure at end of session.    PATIENT EDUCATION:  Education details: [id number] Person educated: Patient Education method: Explanation, Demonstration, Tactile cues, Verbal cues, and Handouts Education comprehension: verbalized understanding, returned demonstration, verbal cues required, tactile cues required, and needs further education  HOME EXERCISE PROGRAM: [phone]  ASSESSMENT:  CLINICAL IMPRESSION: Patient is a 73 y.o. female  who was seen today for physical therapy evaluation and treatment for prolapse, back pain, urgency with urine. Pt session focused on coordination of pelvic floor and breathing with exercises. Pt benefited from very min cues for techniques throughout, denied pressure this  session. Pt tolerated session well reports overall she is having less -no urinary urgency and is doing more without back pain now. Also PFIQ-7 at much lower score indicating progress. Pt reports no back pain at end of session and no pelvic pressure. Pt would benefit from additional PT to further address deficits.    OBJECTIVE IMPAIRMENTS: decreased activity tolerance, decreased coordination, decreased endurance, decreased mobility, decreased ROM, decreased strength, increased fascial restrictions, increased muscle spasms, impaired flexibility, improper body mechanics, postural dysfunction, and pain.   ACTIVITY LIMITATIONS: carrying, lifting, standing, squatting, continence, and locomotion level  PARTICIPATION LIMITATIONS: interpersonal relationship and community activity  PERSONAL FACTORS: Age, Fitness, Time since onset of injury/illness/exacerbation, and 1 comorbidity: medical history are  also affecting patient's functional outcome.   REHAB POTENTIAL: Good  CLINICAL DECISION MAKING: Stable/uncomplicated  EVALUATION COMPLEXITY: Low   GOALS: Goals reviewed with patient? Yes  SHORT TERM GOALS: Target date: 09/06/23 *goals updated 09/05/23 Pt to be I with HEP.  Baseline: Goal status: MET  2.  Pt to demonstrate improved coordination of pelvic floor and breathing mechanics with body weight squat with appropriate synergistic patterns to decrease pain and leakage at least 50% of the time.    Baseline:  Goal status: MET  3.  Pt to demonstrate improved hip strengthening to at least 4/5 grossly at bil hips for improved pelvic stability and decreased strain at prolapse  Baseline:  Goal status: on going - pt has deferred internal but we do plan to reassess in 2 visits  4.  Pt to demonstrate improved upright posture for at least 30 mins for decreased strain at pelvic floor and improved core/back strength.  Baseline:  Goal status: MET   LONG TERM GOALS: Target date: 11/08/23  Pt to be I with  advanced HEP.  Baseline:  Goal status: on going   2.  Pt to demonstrate at least 4+/5 bil hip strength for improved pelvic stability and functional squats without increased pressure at pelvic floor .  Baseline:  Goal status: on going   3. Pt be I with voiding mechanics for improved bladder evacuation.  Baseline:  Goal status: on going   4.  Pt to report no urinary incontinence with urgency for improved tolerance to community outings.  Baseline:  Goal status: MET  5.  Pt to demonstrate no restrictions in lumbar spine for decreased strain at prolapse.  Baseline:  Goal status: on going   6.  Pt to demonstrate improved coordination of pelvic floor and breathing mechanics with 10# squat with appropriate synergistic patterns to decrease pain and leakage at least 75% of the time.    Baseline:  Goal status: on going   PLAN:  PT FREQUENCY: 1-2x/week  PT DURATION: 15 sessions  PLANNED INTERVENTIONS: 97110-Therapeutic exercises, 97530- Therapeutic activity, 97112- Neuromuscular re-education, 97535- Self Care, 82956- Manual therapy, Patient/Family education, Taping, Dry Needling, Joint mobilization, Spinal mobilization, Scar mobilization, DME instructions, Cryotherapy, Moist heat, and Biofeedback  PLAN FOR NEXT SESSION: trunk and hip stretching and core/hip strengthening, pressure management   Avie Lemme, PT, DPT 09/11/2509:47 AM

## 2023-09-18 ENCOUNTER — Other Ambulatory Visit: Payer: Self-pay | Admitting: Physician Assistant

## 2023-09-19 ENCOUNTER — Ambulatory Visit: Admitting: Physical Therapy

## 2023-09-19 DIAGNOSIS — M6281 Muscle weakness (generalized): Secondary | ICD-10-CM | POA: Diagnosis not present

## 2023-09-19 DIAGNOSIS — R279 Unspecified lack of coordination: Secondary | ICD-10-CM

## 2023-09-19 DIAGNOSIS — R293 Abnormal posture: Secondary | ICD-10-CM

## 2023-09-19 NOTE — Telephone Encounter (Signed)
 Last Fill: 08/18/2023  Next Visit: 02/21/2024  Last Visit: 08/10/2023  Dx: Other insomnia   Current Dose per office note on 08/10/2023: Ambien  5 mg 1 tablet by mouth at bedtime for insomnia.   Okay to refill Ambien ?

## 2023-09-19 NOTE — Therapy (Signed)
 OUTPATIENT PHYSICAL THERAPY FEMALE PELVIC TREATMENT   Patient Name: Christina Deleon MRN: [id number] DOB:12-16-1950, 73 y.o., female Today's Date: 09/19/2023  END OF SESSION:  PT End of Session - 09/19/23 1151     Visit Number 8    Date for PT Re-Evaluation 11/08/23    Authorization Type [employer] MCR    PT Start Time 1146    PT Stop Time 1227    PT Time Calculation (min) 41 min    Activity Tolerance Patient tolerated treatment well    Behavior During Therapy [facility] for tasks assessed/performed              Past Medical History:  Diagnosis Date   Adenomatous colon polyp 10/1997, and 03/2009   Allergic rhinitis    Allergy     Aortic atherosclerosis (HCC)    Arthritis    osteo   Asthma    B12 deficiency    Chronic gastritis    Coronary artery calcification seen on CT scan    Fibromyalgia    GERD (gastroesophageal reflux disease)    Hemorrhoids    Hyperlipidemia    Hypertension    Menorrhagia    partial hysterectomy   Migraines    Osteoarthritis    PONV (postoperative nausea and vomiting)    Premature atrial contractions    Sleep disturbance    STD (sexually transmitted disease) 01/22/11 culture proven   HSV type I labia   Past Surgical History:  Procedure Laterality Date   CATARACT EXTRACTION Bilateral 10/16 & 11/16   EXCISION METACARPAL MASS Right 11/16/2016   Procedure: RIGHT LITTLE FINGER CYST EXCISION;  Surgeon: Rober Chimera, MD;  Location: [facility];  Service: Orthopedics;  Laterality: Right;   TONSILLECTOMY  age 58   tooth extract     with bone graft   Turbinate sinus surgery  2003   UPPER GI ENDOSCOPY  07/2022   VAGINAL HYSTERECTOMY  1998   secondary to prolapse, ovaries remain   Patient Active Problem List   Diagnosis Date Noted   Well woman exam with routine gynecological exam 05/23/2023   Female genital prolapse 05/23/2023   Fracture Risk Assessment Score (FRAX) indicating greater than 3% risk for hip fracture 05/23/2023   Anxiety  05/21/2021   Dyslipidemia 05/21/2021   Dysphagia 05/21/2021   External hemorrhoids without complication 05/21/2021   Generalized anxiety disorder 05/21/2021   Hardening of the aorta (main artery of the heart) (HCC) 05/21/2021   History of hysterectomy 05/21/2021   Thyroid  nodule 05/21/2021   Kidney stone 05/21/2021   Multinodular goiter 05/21/2021   Osteopenia 05/21/2021   Pure hypercholesterolemia 05/21/2021   Sciatica 05/21/2021   Stress due to family tension 05/21/2021   Vitamin D  deficiency 05/21/2021   Cervical disc disease 05/21/2021   Unspecified thoracic, thoracolumbar and lumbosacral intervertebral disc disorder 05/21/2021   Dysosmia 12/18/2019   Cough variant asthma vs uacs  04/13/2018   Fibromyalgia 04/19/2016   Other fatigue 04/19/2016   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   Primary osteoarthritis of both knees 04/19/2016   Leg pain 01/22/2016   Restless legs syndrome 01/22/2016   Snoring 11/19/2015   Periodic limb movement sleep disorder 11/19/2015   GERD (gastroesophageal reflux disease) 10/08/2015   Mild persistent asthma 04/09/2015   LPRD (laryngopharyngeal reflux disease) 04/09/2015   Allergic rhinoconjunctivitis 04/09/2015   HEMATOCHEZIA 04/01/2010   COUGH 04/01/2010   Gastritis and gastroduodenitis 09/30/2009   NAUSEA 09/30/2009   ABDOMINAL PAIN-EPIGASTRIC 09/30/2009   DYSTHYMIC DISORDER 09/26/2009   PALPITATIONS 06/17/2009  Impingement syndrome of shoulder, left 06/04/2009   Constipation 02/05/2009   History of colonic polyps 02/05/2009   Asthma 02/04/2009   CHEST PAIN, ATYPICAL 02/04/2009   COLONIC POLYPS, BENIGN 12/18/2008   Hyperlipidemia LDL goal <70 12/18/2008   Insomnia 12/18/2008   Migraine without aura 12/18/2008   Essential hypertension 12/18/2008   Allergic rhinitis 12/18/2008   Primary osteoarthritis of both hands 12/18/2008   DJD (degenerative joint disease), cervical 12/18/2008   NECK PAIN, CHRONIC 12/18/2008   LOW BACK PAIN, CHRONIC  12/18/2008    PCP: 11/08/2023   REFERRING PROVIDER: Romaine Closs, MD   REFERRING DIAG: N81.9 (ICD-10-CM) - Female genital prolapse, unspecified type R33.9 (ICD-10-CM) - Urinary retention  THERAPY DIAG:  Muscle weakness (generalized)  Unspecified lack of coordination  Abnormal posture  Rationale for Evaluation and Treatment: Rehabilitation  ONSET DATE: 6 months   SUBJECTIVE:                                                                                                                                                                                           SUBJECTIVE STATEMENT: Back pain still getting better, pressure    PAIN:  09/12/23    Worse pain in the last week: Are you having pain? Yes NPRS scale: 3/10 Pain location: lt side of back worse but present bil  Pain type: soreness Pain description: dull   Aggravating factors: first getting up, mobility with first waking Relieving factors: time  PRECAUTIONS: None  RED FLAGS: None   WEIGHT BEARING RESTRICTIONS: No  FALLS:  Has patient fallen in last 6 months? No  OCCUPATION: retired   ACTIVITY LEVEL : house hold, minimal yard work   PLOF: Independent  PATIENT GOALS: to have stronger back and hips and less pain. Would like to have better tolerance to cooking and gardening for more than an hour to not have as much pain  PERTINENT HISTORY:  Fibromyalgia, Hemorrhoids, Hypertension, Osteoarthritis, VAGINAL HYSTERECTOMY, HSV type I labia, Anxiety Sexual abuse: No  BOWEL MOVEMENT: Pain with bowel movement: No Type of bowel movement:Type (Bristol Stool Scale) 6, Frequency every day, and Strain no Fully empty rectum: Yes:   Leakage: No Pads: No Fiber supplement/laxative No  URINATION: Pain with urination: No Fully empty bladder: Yes:   Stream: Strong and Weak Urgency: Yes  Frequency: not quicker than every 2 hours; night time varies on fluid intake 1-2x Leakage: none Pads:  No  INTERCOURSE:  Ability to have vaginal penetration Yes  Pain with intercourse: none DrynessYes  Climax: not painful Marinoff Scale: 0/3 Lubricant - no  PREGNANCY: Vaginal deliveries 2 Tearing Yes: tearing "and they put me  to sleep" Episiotomy No C-section deliveries 0 Currently pregnant No  PROLAPSE: None   OBJECTIVE:  Note: Objective measures were completed at Evaluation unless otherwise noted.  DIAGNOSTIC FINDINGS:  DG Lumbar Spine Complete 03/01/23  IMPRESSION: 1. Grade 1 anterolisthesis of L5 and S1 with slight loss of disc space height. 2. Facet hypertrophy in the lower lumbar spine.  PATIENT SURVEYS:    PFIQ-7: 71  09/05/23: PFIQ-7 19  COGNITION: Overall cognitive status: Within functional limits for tasks assessed     SENSATION: Light touch: Appears intact  LUMBAR SPECIAL TESTS:  Single leg stance test: unable to maintain balance without swaying more than 2s on Lt, 4s on right and  hip drop bil   FUNCTIONAL TESTS:  Functional squat - unable to complete without compensatory strategies (excessive forward flexion with minimal hip flexion, hands on thighs to return to standing, and decreased descent distance by 50%   GAIT: WFL  POSTURE: rounded shoulders, forward head, and posterior pelvic tilt   LUMBARAROM/PROM:  A/PROM A/PROM  eval  Flexion Limited by 75%  Extension Limited by 25%  Right lateral flexion Limited by 75%  Left lateral flexion Limited by 75%  Right rotation Limited by 75%  Left rotation Limited by 75%   (Blank rows = not tested)  LOWER EXTREMITY ROM:  Bil hamstrings, adductors and hip IR/ER limited by 50%  LOWER EXTREMITY MMT:  Bil hip abduction 3/5, all others 3+/5; knee 4+/5 PALPATION:   General: tightness at bil lumbar and thoracic paraspinals but worse at Lt side; bil piriformis and gluteal tightness but worse at Lt  Pelvic Alignment: WFL  Abdominal: mild tightness in Rt mid/low quadrants but no TTP                 External Perineal Exam: dryness, age related changes noted                             Internal Pelvic Floor: no TTP  Patient confirms identification and approves PT to assess internal pelvic floor and treatment Yes No emotional/communication barriers or cognitive limitation. Patient is motivated to learn. Patient understands and agrees with treatment goals and plan. PT explains patient will be examined in standing, sitting, and lying down to see how their muscles and joints work. When they are ready, they will be asked to remove their underwear so PT can examine their perineum. The patient is also given the option of providing their own chaperone as one is not provided in our facility. The patient also has the right and is explained the right to defer or refuse any part of the evaluation or treatment including the internal exam. With the patient's consent, PT will use one gloved finger to gently assess the muscles of the pelvic floor, seeing how well it contracts and relaxes and if there is muscle symmetry. After, the patient will get dressed and PT and patient will discuss exam findings and plan of care. PT and patient discuss plan of care, schedule, attendance policy and HEP activities.  PELVIC MMT:   MMT eval  Vaginal 1/5; 3s; 5 reps  Internal Anal Sphincter   External Anal Sphincter   Puborectalis   Diastasis Recti   (Blank rows = not tested)        TONE: Decreased   PROLAPSE: Possible grade 2 anterior wall laxity noted with cough in hooklying   TODAY'S TREATMENT:  DATE:   09/05/23: Nustep 5 mins L7 - PT present to discuss progress Pelvic floor contraction + cough 2x10 (second set slightly harder cough) Bridges 2x10 7#  Quad needle threaders and full opening x10 Seated 4# anti rotation bil 2x10 4# mario punches x10 Pelvic propping 4 mins with diaphragmatic  breathing and educated on continuing this at home in the evening oras needed during day for ~10-15 mins as long as no symptoms (pain, discomfort, dizziness, or other adverse reactions). Pt demonstrated and verbalized understanding.   09/12/23: 2x10 Sit to stand 10#+5#  Hooklying bil shoulder horizontal abduction green band 2x10 with transverse abdominis activation Opp hand/knee ball press alt 2x10 Standing lateral crunch 10# 2x10 each Bosu - forward weight shifts 2x10 each, mini step ups x10 each, 30s static standing - min A 5# 2x10 mario punches Farmers carry 10# 1000' each hand Wedge pelvic prop 3 mins end of session to limit any pelvic pressure. Pt denied pressure at end of session.    09/19/23: 2x10 15# Sit to stand + pelvic floor contraction and exhale 2x10 each 5# standing alt marching with weight help iso at chest height extended Farmer's carry 10# +5# 1000' switched hands completed again Blue band standing rotational palloffs 2x10 Standing alt marching blue band bil shoulder flexion 2x10 3x30s iso bridges  X10 hooklying pelvic floor contractions  PATIENT EDUCATION:  Education details: [id number] Person educated: Patient Education method: Explanation, Demonstration, Tactile cues, Verbal cues, and Handouts Education comprehension: verbalized understanding, returned demonstration, verbal cues required, tactile cues required, and needs further education  HOME EXERCISE PROGRAM: [id number]  ASSESSMENT:  CLINICAL IMPRESSION: Patient is a 73 y.o. female  who was seen today for physical therapy treatment for prolapse, back pain, urgency with urine. Pt session focused on coordination of pelvic floor and breathing with exercises. Pt denied pressure this session. Pt continues to report  improvement overall and progressing toward all goals. Pt does report still having back pain and pelvic pressure with prolonged standing educated to attempt midday pelvic propping for ~15 mins to improve  symptom management. Pt would benefit from additional PT to further address deficits.    OBJECTIVE IMPAIRMENTS: decreased activity tolerance, decreased coordination, decreased endurance, decreased mobility, decreased ROM, decreased strength, increased fascial restrictions, increased muscle spasms, impaired flexibility, improper body mechanics, postural dysfunction, and pain.   ACTIVITY LIMITATIONS: carrying, lifting, standing, squatting, continence, and locomotion level  PARTICIPATION LIMITATIONS: interpersonal relationship and community activity  PERSONAL FACTORS: Age, Fitness, Time since onset of injury/illness/exacerbation, and 1 comorbidity: medical history are also affecting patient's functional outcome.   REHAB POTENTIAL: Good  CLINICAL DECISION MAKING: Stable/uncomplicated  EVALUATION COMPLEXITY: Low   GOALS: Goals reviewed with patient? Yes  SHORT TERM GOALS: Target date: 09/06/23 *goals updated 09/05/23 Pt to be I with HEP.  Baseline: Goal status: MET  2.  Pt to demonstrate improved coordination of pelvic floor and breathing mechanics with body weight squat with appropriate synergistic patterns to decrease pain and leakage at least 50% of the time.    Baseline:  Goal status: MET  3.  Pt to demonstrate improved hip strengthening to at least 4/5 grossly at bil hips for improved pelvic stability and decreased strain at prolapse  Baseline:  Goal status: on going - pt has deferred internal but we do plan to reassess next visit  4.  Pt to demonstrate improved upright posture for at least 30 mins for decreased strain at pelvic floor and improved core/back strength.  Baseline:  Goal status: MET  LONG TERM GOALS: Target date: 11/08/23  Pt to be I with advanced HEP.  Baseline:  Goal status: on going   2.  Pt to demonstrate at least 4+/5 bil hip strength for improved pelvic stability and functional squats without increased pressure at pelvic floor .  Baseline:  Goal status: on  going   3. Pt be I with voiding mechanics for improved bladder evacuation.  Baseline:  Goal status: MET 5/19  4.  Pt to report no urinary incontinence with urgency for improved tolerance to community outings.  Baseline:  Goal status: MET  5.  Pt to demonstrate no restrictions in lumbar spine for decreased strain at prolapse.  Baseline:  Goal status: on going   6.  Pt to demonstrate improved coordination of pelvic floor and breathing mechanics with 10# squat with appropriate synergistic patterns to decrease pain and leakage at least 75% of the time.    Baseline:  Goal status: MET 5/19  PLAN:  PT FREQUENCY: 1-2x/week  PT DURATION: 15 sessions  PLANNED INTERVENTIONS: 97110-Therapeutic exercises, 97530- Therapeutic activity, 97112- Neuromuscular re-education, 97535- Self Care, 96045- Manual therapy, Patient/Family education, Taping, Dry Needling, Joint mobilization, Spinal mobilization, Scar mobilization, DME instructions, Cryotherapy, Moist heat, and Biofeedback  PLAN FOR NEXT SESSION: trunk and hip stretching and core/hip strengthening, pressure management   Avie Lemme, PT, DPT 09/19/2510:31 PM

## 2023-09-20 ENCOUNTER — Other Ambulatory Visit: Payer: Self-pay

## 2023-09-20 MED ORDER — MONTELUKAST SODIUM 10 MG PO TABS: ORAL_TABLET | ORAL | 1 refills | Status: DC

## 2023-09-28 ENCOUNTER — Encounter: Payer: Self-pay | Admitting: Physical Therapy

## 2023-09-28 ENCOUNTER — Ambulatory Visit: Admitting: Physical Therapy

## 2023-09-28 DIAGNOSIS — M6281 Muscle weakness (generalized): Secondary | ICD-10-CM

## 2023-09-28 DIAGNOSIS — R279 Unspecified lack of coordination: Secondary | ICD-10-CM

## 2023-09-28 DIAGNOSIS — R293 Abnormal posture: Secondary | ICD-10-CM

## 2023-09-28 NOTE — Patient Instructions (Signed)

## 2023-09-28 NOTE — Therapy (Signed)
 OUTPATIENT PHYSICAL THERAPY FEMALE PELVIC TREATMENT   Patient Name: Christina Deleon MRN: [id number] DOB:1950/05/25, 73 y.o., female Today's Date: 09/28/2023  END OF SESSION:  PT End of Session - 09/28/23 1108     Visit Number 9    Date for PT Re-Evaluation 11/08/23    Authorization Type [employer] MCR    Progress Note Due on Visit 10    PT Start Time 1103    PT Stop Time 1142    PT Time Calculation (min) 39 min    Activity Tolerance Patient tolerated treatment well    Behavior During Therapy WFL for tasks assessed/performed              Past Medical History:  Diagnosis Date   Adenomatous colon polyp 10/1997, and 03/2009   Allergic rhinitis    Allergy     Aortic atherosclerosis (HCC)    Arthritis    osteo   Asthma    B12 deficiency    Chronic gastritis    Coronary artery calcification seen on CT scan    Fibromyalgia    GERD (gastroesophageal reflux disease)    Hemorrhoids    Hyperlipidemia    Hypertension    Menorrhagia    partial hysterectomy   Migraines    Osteoarthritis    PONV (postoperative nausea and vomiting)    Premature atrial contractions    Sleep disturbance    STD (sexually transmitted disease) 01/22/11 culture proven   HSV type I labia   Past Surgical History:  Procedure Laterality Date   CATARACT EXTRACTION Bilateral 10/16 & 11/16   EXCISION METACARPAL MASS Right 11/16/2016   Procedure: RIGHT LITTLE FINGER CYST EXCISION;  Surgeon: Rober Chimera, MD;  Location: [facility];  Service: Orthopedics;  Laterality: Right;   TONSILLECTOMY  age 51   tooth extract     with bone graft   Turbinate sinus surgery  2003   UPPER GI ENDOSCOPY  07/2022   VAGINAL HYSTERECTOMY  1998   secondary to prolapse, ovaries remain   Patient Active Problem List   Diagnosis Date Noted   Well woman exam with routine gynecological exam 05/23/2023   Female genital prolapse 05/23/2023   Fracture Risk Assessment Score (FRAX) indicating greater than 3% risk for hip  fracture 05/23/2023   Anxiety 05/21/2021   Dyslipidemia 05/21/2021   Dysphagia 05/21/2021   External hemorrhoids without complication 05/21/2021   Generalized anxiety disorder 05/21/2021   Hardening of the aorta (main artery of the heart) (HCC) 05/21/2021   History of hysterectomy 05/21/2021   Thyroid  nodule 05/21/2021   Kidney stone 05/21/2021   Multinodular goiter 05/21/2021   Osteopenia 05/21/2021   Pure hypercholesterolemia 05/21/2021   Sciatica 05/21/2021   Stress due to family tension 05/21/2021   Vitamin D  deficiency 05/21/2021   Cervical disc disease 05/21/2021   Unspecified thoracic, thoracolumbar and lumbosacral intervertebral disc disorder 05/21/2021   Dysosmia 12/18/2019   Cough variant asthma vs uacs  04/13/2018   Fibromyalgia 04/19/2016   Other fatigue 04/19/2016   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   Primary osteoarthritis of both knees 04/19/2016   Leg pain 01/22/2016   Restless legs syndrome 01/22/2016   Snoring 11/19/2015   Periodic limb movement sleep disorder 11/19/2015   GERD (gastroesophageal reflux disease) 10/08/2015   Mild persistent asthma 04/09/2015   LPRD (laryngopharyngeal reflux disease) 04/09/2015   Allergic rhinoconjunctivitis 04/09/2015   HEMATOCHEZIA 04/01/2010   COUGH 04/01/2010   Gastritis and gastroduodenitis 09/30/2009   NAUSEA 09/30/2009   ABDOMINAL PAIN-EPIGASTRIC 09/30/2009  DYSTHYMIC DISORDER 09/26/2009   PALPITATIONS 06/17/2009   Impingement syndrome of shoulder, left 06/04/2009   Constipation 02/05/2009   History of colonic polyps 02/05/2009   Asthma 02/04/2009   CHEST PAIN, ATYPICAL 02/04/2009   COLONIC POLYPS, BENIGN 12/18/2008   Hyperlipidemia LDL goal <70 12/18/2008   Insomnia 12/18/2008   Migraine without aura 12/18/2008   Essential hypertension 12/18/2008   Allergic rhinitis 12/18/2008   Primary osteoarthritis of both hands 12/18/2008   DJD (degenerative joint disease), cervical 12/18/2008   NECK PAIN, CHRONIC 12/18/2008    LOW BACK PAIN, CHRONIC 12/18/2008    PCP: 11/08/2023   REFERRING PROVIDER: Romaine Closs, MD   REFERRING DIAG: N81.9 (ICD-10-CM) - Female genital prolapse, unspecified type R33.9 (ICD-10-CM) - Urinary retention  THERAPY DIAG:  Muscle weakness (generalized)  Unspecified lack of coordination  Abnormal posture  Rationale for Evaluation and Treatment: Rehabilitation  ONSET DATE: 6 months   SUBJECTIVE:                                                                                                                                                                                           SUBJECTIVE STATEMENT: Overall has felt much better since starting PT.    PAIN:  09/28/23   Are you having pain? Yes NPRS scale: 3/10 Pain location: lt side of back worse but present bil  Pain type: soreness Pain description: dull   Aggravating factors: first getting up, mobility with first waking Relieving factors: time  PRECAUTIONS: None  RED FLAGS: None   WEIGHT BEARING RESTRICTIONS: No  FALLS:  Has patient fallen in last 6 months? No  OCCUPATION: retired   ACTIVITY LEVEL : house hold, minimal yard work   PLOF: Independent  PATIENT GOALS: to have stronger back and hips and less pain. Would like to have better tolerance to cooking and gardening for more than an hour to not have as much pain  PERTINENT HISTORY:  Fibromyalgia, Hemorrhoids, Hypertension, Osteoarthritis, VAGINAL HYSTERECTOMY, HSV type I labia, Anxiety Sexual abuse: No  BOWEL MOVEMENT: Pain with bowel movement: No Type of bowel movement:Type (Bristol Stool Scale) 6, Frequency every day, and Strain no Fully empty rectum: Yes:   Leakage: No Pads: No Fiber supplement/laxative No  URINATION: Pain with urination: No Fully empty bladder: Yes:   Stream: Strong and Weak Urgency: Yes  Frequency: not quicker than every 2 hours; night time varies on fluid intake 1-2x Leakage: none Pads:  No  INTERCOURSE:  Ability to have vaginal penetration Yes  Pain with intercourse: none DrynessYes  Climax: not painful Marinoff Scale: 0/3 Lubricant - no  PREGNANCY: Vaginal deliveries 2 Tearing Yes: tearing "  and they put me to sleep" Episiotomy No C-section deliveries 0 Currently pregnant No  PROLAPSE: None   OBJECTIVE:  Note: Objective measures were completed at Evaluation unless otherwise noted.  DIAGNOSTIC FINDINGS:  DG Lumbar Spine Complete 03/01/23  IMPRESSION: 1. Grade 1 anterolisthesis of L5 and S1 with slight loss of disc space height. 2. Facet hypertrophy in the lower lumbar spine.  PATIENT SURVEYS:    PFIQ-7: 71  09/05/23: PFIQ-7 19 09/28/23:   COGNITION: Overall cognitive status: Within functional limits for tasks assessed     SENSATION: Light touch: Appears intact  LUMBAR SPECIAL TESTS:  Single leg stance test: unable to maintain balance without swaying more than 2s on Lt, 4s on right and  hip drop bil   FUNCTIONAL TESTS:  Functional squat - unable to complete without compensatory strategies (excessive forward flexion with minimal hip flexion, hands on thighs to return to standing, and decreased descent distance by 50%   GAIT: WFL  POSTURE: rounded shoulders, forward head, and posterior pelvic tilt   LUMBARAROM/PROM:  A/PROM A/PROM  eval  Flexion Limited by 75%  Extension Limited by 25%  Right lateral flexion Limited by 75%  Left lateral flexion Limited by 75%  Right rotation Limited by 75%  Left rotation Limited by 75%   (Blank rows = not tested)  LOWER EXTREMITY ROM:  Bil hamstrings, adductors and hip IR/ER limited by 50%  LOWER EXTREMITY MMT:  Bil hip abduction 3/5, all others 3+/5; knee 4+/5 PALPATION:   General: tightness at bil lumbar and thoracic paraspinals but worse at Lt side; bil piriformis and gluteal tightness but worse at Lt  Pelvic Alignment: WFL  Abdominal: mild tightness in Rt mid/low quadrants but no TTP                 External Perineal Exam: dryness, age related changes noted                             Internal Pelvic Floor: no TTP  Patient confirms identification and approves PT to assess internal pelvic floor and treatment Yes No emotional/communication barriers or cognitive limitation. Patient is motivated to learn. Patient understands and agrees with treatment goals and plan. PT explains patient will be examined in standing, sitting, and lying down to see how their muscles and joints work. When they are ready, they will be asked to remove their underwear so PT can examine their perineum. The patient is also given the option of providing their own chaperone as one is not provided in our facility. The patient also has the right and is explained the right to defer or refuse any part of the evaluation or treatment including the internal exam. With the patient's consent, PT will use one gloved finger to gently assess the muscles of the pelvic floor, seeing how well it contracts and relaxes and if there is muscle symmetry. After, the patient will get dressed and PT and patient will discuss exam findings and plan of care. PT and patient discuss plan of care, schedule, attendance policy and HEP activities.  PELVIC MMT:   MMT eval 09/28/23  Vaginal 1/5; 3s; 5 reps 3/5; 7s; 6 reps  Internal Anal Sphincter    External Anal Sphincter    Puborectalis    Diastasis Recti    (Blank rows = not tested)        TONE: Decreased   PROLAPSE: Possible grade 2 anterior wall laxity noted with cough in  hooklying  09/28/23: grade 1 at rest in hooklying, grade 2 with cough but with cues for activation of pelvic floor was reduced. Possible rectocele (also reported with MD appointment 05/23/23) but not worsened with coughing in hooklying    TODAY'S TREATMENT:                                                                                                                              DATE:    09/12/23: 2x10 Sit to stand  10#+5#  Hooklying bil shoulder horizontal abduction green band 2x10 with transverse abdominis activation Opp hand/knee ball press alt 2x10 Standing lateral crunch 10# 2x10 each Bosu - forward weight shifts 2x10 each, mini step ups x10 each, 30s static standing - min A 5# 2x10 mario punches Farmers carry 10# 1000' each hand Wedge pelvic prop 3 mins end of session to limit any pelvic pressure. Pt denied pressure at end of session.    09/19/23: 2x10 15# Sit to stand + pelvic floor contraction and exhale 2x10 each 5# standing alt marching with weight help iso at chest height extended Farmer's carry 10# +5# 1000' switched hands completed again Blue band standing rotational palloffs 2x10 Standing alt marching blue band bil shoulder flexion 2x10 3x30s iso bridges  X10 hooklying pelvic floor contractions  09/28/23: Patient consented to internal pelvic floor reassessment vaginally this date and found to have improved strength, endurance, and coordination compared to evaluation. Pt does continue to have weakness and can still see anterior and posterior vaginal wall laxity with coughing but anterior wall improved since eval with minimal visualization at rest and reduced tissue descent with cough and pelvic floor activation compared to evaluation. Posterior laxity not worsened with cough. See details above for findings.  3x10 pelvic floor contractions X10 6-8s isometrics 2x10 quick flicks Pt had questions about medical management for prolapse symptoms and encouraged to reach out to MD about this..Pt educated on asking MD about theses recommendations and or a pessary.    PATIENT EDUCATION:  Education details: [id number] Person educated: Patient Education method: Explanation, Demonstration, Tactile cues, Verbal cues, and Handouts Education comprehension: verbalized understanding, returned demonstration, verbal cues required, tactile cues required, and needs further education  HOME EXERCISE  PROGRAM: [id number]  ASSESSMENT:  CLINICAL IMPRESSION: Patient is a 73 y.o. female  who was seen today for physical therapy treatment for prolapse, back pain, urgency with urine. Pt session focused on reassessment of pelvic floor and pt demonstrated improvement in all sections, see above for details. Pt progressing toward all goals and reporting improvement in symptoms though not resolved. Pt would benefit from additional PT to further address deficits.    OBJECTIVE IMPAIRMENTS: decreased activity tolerance, decreased coordination, decreased endurance, decreased mobility, decreased ROM, decreased strength, increased fascial restrictions, increased muscle spasms, impaired flexibility, improper body mechanics, postural dysfunction, and pain.   ACTIVITY LIMITATIONS: carrying, lifting, standing, squatting, continence, and locomotion level  PARTICIPATION LIMITATIONS: interpersonal relationship and community activity  PERSONAL FACTORS:  Age, Fitness, Time since onset of injury/illness/exacerbation, and 1 comorbidity: medical history are also affecting patient's functional outcome.   REHAB POTENTIAL: Good  CLINICAL DECISION MAKING: Stable/uncomplicated  EVALUATION COMPLEXITY: Low   GOALS: Goals reviewed with patient? Yes  SHORT TERM GOALS: Target date: 09/06/23 *goals updated 09/28/23 Pt to be I with HEP.  Baseline: Goal status: MET  2.  Pt to demonstrate improved coordination of pelvic floor and breathing mechanics with body weight squat with appropriate synergistic patterns to decrease pain and leakage at least 50% of the time.    Baseline:  Goal status: MET  3.  Pt to demonstrate improved hip strengthening to at least 4/5 grossly at bil hips for improved pelvic stability and decreased strain at prolapse  Baseline:  Goal status: MET  4.  Pt to demonstrate improved upright posture for at least 30 mins for decreased strain at pelvic floor and improved core/back strength.  Baseline:  Goal  status: MET   LONG TERM GOALS: Target date: 11/08/23  Pt to be I with advanced HEP.  Baseline:  Goal status: on going   2.  Pt to demonstrate at least 4+/5 bil hip strength for improved pelvic stability and functional squats without increased pressure at pelvic floor .  Baseline:  Goal status: on going   3. Pt be I with voiding mechanics for improved bladder evacuation.  Baseline:  Goal status: MET 5/19  4.  Pt to report no urinary incontinence with urgency for improved tolerance to community outings.  Baseline:  Goal status: MET  5.  Pt to demonstrate no restrictions in lumbar spine for decreased strain at prolapse.  Baseline:  Goal status: on going   6.  Pt to demonstrate improved coordination of pelvic floor and breathing mechanics with 10# squat with appropriate synergistic patterns to decrease pain and leakage at least 75% of the time.    Baseline:  Goal status: MET 5/19  PLAN:  PT FREQUENCY: 1-2x/week  PT DURATION: 15 sessions  PLANNED INTERVENTIONS: 97110-Therapeutic exercises, 97530- Therapeutic activity, 97112- Neuromuscular re-education, 97535- Self Care, 16109- Manual therapy, Patient/Family education, Taping, Dry Needling, Joint mobilization, Spinal mobilization, Scar mobilization, DME instructions, Cryotherapy, Moist heat, and Biofeedback  PLAN FOR NEXT SESSION: trunk and hip stretching and core/hip strengthening, pressure management   Avie Lemme, PT, DPT 09/28/2510:34 PM

## 2023-10-04 ENCOUNTER — Ambulatory Visit

## 2023-10-06 ENCOUNTER — Ambulatory Visit: Attending: Obstetrics and Gynecology | Admitting: Physical Therapy

## 2023-10-06 DIAGNOSIS — M6281 Muscle weakness (generalized): Secondary | ICD-10-CM | POA: Diagnosis present

## 2023-10-06 DIAGNOSIS — R293 Abnormal posture: Secondary | ICD-10-CM | POA: Insufficient documentation

## 2023-10-06 DIAGNOSIS — R279 Unspecified lack of coordination: Secondary | ICD-10-CM | POA: Diagnosis present

## 2023-10-06 NOTE — Therapy (Signed)
 OUTPATIENT PHYSICAL THERAPY FEMALE PELVIC TREATMENT   Patient Name: Christina Deleon MRN: [id number] DOB:07/04/1950, 73 y.o., female Today's Date: 10/06/2023  Progress Note Reporting Period 08/09/23 to 10/06/23  See note below for Objective Data and Assessment of Progress/Goals.      END OF SESSION:  PT End of Session - 10/06/23 1417     Visit Number 10    Date for PT Re-Evaluation 11/08/23    Authorization Type [employer] MCR    Progress Note Due on Visit 20    PT Start Time 1147    PT Stop Time 1226    PT Time Calculation (min) 39 min    Activity Tolerance Patient tolerated treatment well    Behavior During Therapy WFL for tasks assessed/performed               Past Medical History:  Diagnosis Date   Adenomatous colon polyp 10/1997, and 03/2009   Allergic rhinitis    Allergy     Aortic atherosclerosis (HCC)    Arthritis    osteo   Asthma    B12 deficiency    Chronic gastritis    Coronary artery calcification seen on CT scan    Fibromyalgia    GERD (gastroesophageal reflux disease)    Hemorrhoids    Hyperlipidemia    Hypertension    Menorrhagia    partial hysterectomy   Migraines    Osteoarthritis    PONV (postoperative nausea and vomiting)    Premature atrial contractions    Sleep disturbance    STD (sexually transmitted disease) 01/22/11 culture proven   HSV type I labia   Past Surgical History:  Procedure Laterality Date   CATARACT EXTRACTION Bilateral 10/16 & 11/16   EXCISION METACARPAL MASS Right 11/16/2016   Procedure: RIGHT LITTLE FINGER CYST EXCISION;  Surgeon: Rober Chimera, MD;  Location: [facility];  Service: Orthopedics;  Laterality: Right;   TONSILLECTOMY  age 32   tooth extract     with bone graft   Turbinate sinus surgery  2003   UPPER GI ENDOSCOPY  07/2022   VAGINAL HYSTERECTOMY  1998   secondary to prolapse, ovaries remain   Patient Active Problem List   Diagnosis Date Noted   Well woman exam with routine gynecological exam  05/23/2023   Female genital prolapse 05/23/2023   Fracture Risk Assessment Score (FRAX) indicating greater than 3% risk for hip fracture 05/23/2023   Anxiety 05/21/2021   Dyslipidemia 05/21/2021   Dysphagia 05/21/2021   External hemorrhoids without complication 05/21/2021   Generalized anxiety disorder 05/21/2021   Hardening of the aorta (main artery of the heart) (HCC) 05/21/2021   History of hysterectomy 05/21/2021   Thyroid  nodule 05/21/2021   Kidney stone 05/21/2021   Multinodular goiter 05/21/2021   Osteopenia 05/21/2021   Pure hypercholesterolemia 05/21/2021   Sciatica 05/21/2021   Stress due to family tension 05/21/2021   Vitamin D  deficiency 05/21/2021   Cervical disc disease 05/21/2021   Unspecified thoracic, thoracolumbar and lumbosacral intervertebral disc disorder 05/21/2021   Dysosmia 12/18/2019   Cough variant asthma vs uacs  04/13/2018   Fibromyalgia 04/19/2016   Other fatigue 04/19/2016   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   Primary osteoarthritis of both knees 04/19/2016   Leg pain 01/22/2016   Restless legs syndrome 01/22/2016   Snoring 11/19/2015   Periodic limb movement sleep disorder 11/19/2015   GERD (gastroesophageal reflux disease) 10/08/2015   Mild persistent asthma 04/09/2015   LPRD (laryngopharyngeal reflux disease) 04/09/2015   Allergic rhinoconjunctivitis  04/09/2015   HEMATOCHEZIA 04/01/2010   COUGH 04/01/2010   Gastritis and gastroduodenitis 09/30/2009   NAUSEA 09/30/2009   ABDOMINAL PAIN-EPIGASTRIC 09/30/2009   DYSTHYMIC DISORDER 09/26/2009   PALPITATIONS 06/17/2009   Impingement syndrome of shoulder, left 06/04/2009   Constipation 02/05/2009   History of colonic polyps 02/05/2009   Asthma 02/04/2009   CHEST PAIN, ATYPICAL 02/04/2009   COLONIC POLYPS, BENIGN 12/18/2008   Hyperlipidemia LDL goal <70 12/18/2008   Insomnia 12/18/2008   Migraine without aura 12/18/2008   Essential hypertension 12/18/2008   Allergic rhinitis 12/18/2008   Primary  osteoarthritis of both hands 12/18/2008   DJD (degenerative joint disease), cervical 12/18/2008   NECK PAIN, CHRONIC 12/18/2008   LOW BACK PAIN, CHRONIC 12/18/2008    PCP: 11/08/2023   REFERRING PROVIDER: Romaine Closs, MD   REFERRING DIAG: N81.9 (ICD-10-CM) - Female genital prolapse, unspecified type R33.9 (ICD-10-CM) - Urinary retention  THERAPY DIAG:  Muscle weakness (generalized)  Unspecified lack of coordination  Abnormal posture  Rationale for Evaluation and Treatment: Rehabilitation  ONSET DATE: 6 months   SUBJECTIVE:                                                                                                                                                                                           SUBJECTIVE STATEMENT: 80% better since starting PT with prolapse.  Back pain with mobility/use like bending or walking 20 mins; standing 20 mins feels tight/sore/achy Notes she is stronger since starting PT and stronger core has helped her be able to more with this but soreness still present after 20 mins   PAIN:   Are you having pain? Yes NPRS scale: 3/10 Pain location: lt side of back worse but present bil  Pain type: soreness Pain description: dull   Aggravating factors: first getting up, mobility with first waking Relieving factors: time  PRECAUTIONS: None  RED FLAGS: None   WEIGHT BEARING RESTRICTIONS: No  FALLS:  Has patient fallen in last 6 months? No  OCCUPATION: retired   ACTIVITY LEVEL : house hold, minimal yard work   PLOF: Independent  PATIENT GOALS: to have stronger back and hips and less pain. Would like to have better tolerance to cooking and gardening for more than an hour to not have as much pain  PERTINENT HISTORY:  Fibromyalgia, Hemorrhoids, Hypertension, Osteoarthritis, VAGINAL HYSTERECTOMY, HSV type I labia, Anxiety Sexual abuse: No  BOWEL MOVEMENT: Pain with bowel movement: No Type of bowel movement:Type (Bristol Stool  Scale) 6, Frequency every day, and Strain no Fully empty rectum: Yes:   Leakage: No Pads: No Fiber supplement/laxative No  URINATION: Pain with urination: No  Fully empty bladder: Yes:   Stream: Strong and Weak Urgency: Yes  Frequency: not quicker than every 2 hours; night time varies on fluid intake 1-2x Leakage: none Pads: No  INTERCOURSE:  Ability to have vaginal penetration Yes  Pain with intercourse: none DrynessYes  Climax: not painful Marinoff Scale: 0/3 Lubricant - no  PREGNANCY: Vaginal deliveries 2 Tearing Yes: tearing "and they put me to sleep" Episiotomy No C-section deliveries 0 Currently pregnant No  PROLAPSE: None   OBJECTIVE:  Note: Objective measures were completed at Evaluation unless otherwise noted.  DIAGNOSTIC FINDINGS:  DG Lumbar Spine Complete 03/01/23  IMPRESSION: 1. Grade 1 anterolisthesis of L5 and S1 with slight loss of disc space height. 2. Facet hypertrophy in the lower lumbar spine.  PATIENT SURVEYS:    PFIQ-7: 71  09/05/23: PFIQ-7 19 09/28/23: PFIQ-7 14  COGNITION: Overall cognitive status: Within functional limits for tasks assessed     SENSATION: Light touch: Appears intact  LUMBAR SPECIAL TESTS:  Single leg stance test: unable to maintain balance without swaying more than 2s on Lt, 4s on right and  hip drop bil   FUNCTIONAL TESTS:  Functional squat - unable to complete without compensatory strategies (excessive forward flexion with minimal hip flexion, hands on thighs to return to standing, and decreased descent distance by 50%   GAIT: WFL  POSTURE: rounded shoulders, forward head, and posterior pelvic tilt   LUMBARAROM/PROM:  A/PROM A/PROM  eval  Flexion Limited by 75%  Extension Limited by 25%  Right lateral flexion Limited by 75%  Left lateral flexion Limited by 75%  Right rotation Limited by 75%  Left rotation Limited by 75%   (Blank rows = not tested)  LOWER EXTREMITY ROM:  Bil hamstrings, adductors  and hip IR/ER limited by 50%  LOWER EXTREMITY MMT:  Bil hip abduction 3/5, all others 3+/5; knee 4+/5 PALPATION:   General: tightness at bil lumbar and thoracic paraspinals but worse at Lt side; bil piriformis and gluteal tightness but worse at Lt  Pelvic Alignment: WFL  Abdominal: mild tightness in Rt mid/low quadrants but no TTP                External Perineal Exam: dryness, age related changes noted                             Internal Pelvic Floor: no TTP  Patient confirms identification and approves PT to assess internal pelvic floor and treatment Yes No emotional/communication barriers or cognitive limitation. Patient is motivated to learn. Patient understands and agrees with treatment goals and plan. PT explains patient will be examined in standing, sitting, and lying down to see how their muscles and joints work. When they are ready, they will be asked to remove their underwear so PT can examine their perineum. The patient is also given the option of providing their own chaperone as one is not provided in our facility. The patient also has the right and is explained the right to defer or refuse any part of the evaluation or treatment including the internal exam. With the patient's consent, PT will use one gloved finger to gently assess the muscles of the pelvic floor, seeing how well it contracts and relaxes and if there is muscle symmetry. After, the patient will get dressed and PT and patient will discuss exam findings and plan of care. PT and patient discuss plan of care, schedule, attendance policy and HEP activities.  PELVIC  MMT:   MMT eval 09/28/23  Vaginal 1/5; 3s; 5 reps 3/5; 7s; 6 reps  Internal Anal Sphincter    External Anal Sphincter    Puborectalis    Diastasis Recti    (Blank rows = not tested)        TONE: Decreased   PROLAPSE: Possible grade 2 anterior wall laxity noted with cough in hooklying  09/28/23: grade 1 at rest in hooklying, grade 2 with cough but  with cues for activation of pelvic floor was reduced. Possible rectocele (also reported with MD appointment 05/23/23) but not worsened with coughing in hooklying    TODAY'S TREATMENT:                                                                                                                              DATE:    09/19/23: 2x10 15# Sit to stand + pelvic floor contraction and exhale 2x10 each 5# standing alt marching with weight help iso at chest height extended Farmer's carry 10# +5# 1000' switched hands completed again Blue band standing rotational palloffs 2x10 Standing alt marching blue band bil shoulder flexion 2x10 3x30s iso bridges  X10 hooklying pelvic floor contractions  09/28/23: Patient consented to internal pelvic floor reassessment vaginally this date and found to have improved strength, endurance, and coordination compared to evaluation. Pt does continue to have weakness and can still see anterior and posterior vaginal wall laxity with coughing but anterior wall improved since eval with minimal visualization at rest and reduced tissue descent with cough and pelvic floor activation compared to evaluation. Posterior laxity not worsened with cough. See details above for findings.  3x10 pelvic floor contractions X10 6-8s isometrics 2x10 quick flicks Pt had questions about medical management for prolapse symptoms and encouraged to reach out to MD about this..Pt educated on asking MD about theses recommendations and or a pessary.   10/06/23: Addaday L2 pt in prone - bil lumbar paraspinals, gluteals, and hamstrings for tissue mobility and decreased tension at pelvic floor and prolapse X10 cat/cow X10 tail wags X5 OH reach >lateral trunk stretch each way> forward trunk flexion stretches Nustep 8 mins L5 PT present to discuss progress and POC    PATIENT EDUCATION:  Education details: [id number] Person educated: Patient Education method: Explanation, Demonstration, Tactile cues, Verbal  cues, and Handouts Education comprehension: verbalized understanding, returned demonstration, verbal cues required, tactile cues required, and needs further education  HOME EXERCISE PROGRAM: [id number]  ASSESSMENT:  CLINICAL IMPRESSION: Patient is a 73 y.o. female  who was seen today for physical therapy treatment for prolapse, back pain, urgency with urine. Pt session focused on low back mobility to decreased strain at pelvic floor and prolapse. Pt progressing toward all goals and reporting improvement in symptoms though not resolved. Pt would benefit from additional PT to further address deficits.    OBJECTIVE IMPAIRMENTS: decreased activity tolerance, decreased coordination, decreased endurance, decreased mobility, decreased ROM, decreased strength, increased fascial restrictions, increased muscle  spasms, impaired flexibility, improper body mechanics, postural dysfunction, and pain.   ACTIVITY LIMITATIONS: carrying, lifting, standing, squatting, continence, and locomotion level  PARTICIPATION LIMITATIONS: interpersonal relationship and community activity  PERSONAL FACTORS: Age, Fitness, Time since onset of injury/illness/exacerbation, and 1 comorbidity: medical history are also affecting patient's functional outcome.   REHAB POTENTIAL: Good  CLINICAL DECISION MAKING: Stable/uncomplicated  EVALUATION COMPLEXITY: Low   GOALS: Goals reviewed with patient? Yes  SHORT TERM GOALS: Target date: 09/06/23 *goals updated 09/28/23 Pt to be I with HEP.  Baseline: Goal status: MET  2.  Pt to demonstrate improved coordination of pelvic floor and breathing mechanics with body weight squat with appropriate synergistic patterns to decrease pain and leakage at least 50% of the time.    Baseline:  Goal status: MET  3.  Pt to demonstrate improved hip strengthening to at least 4/5 grossly at bil hips for improved pelvic stability and decreased strain at prolapse  Baseline:  Goal status: MET  4.   Pt to demonstrate improved upright posture for at least 30 mins for decreased strain at pelvic floor and improved core/back strength.  Baseline:  Goal status: MET   LONG TERM GOALS: Target date: 11/08/23  Pt to be I with advanced HEP.  Baseline:  Goal status: on going   2.  Pt to demonstrate at least 4+/5 bil hip strength for improved pelvic stability and functional squats without increased pressure at pelvic floor .  Baseline:  Goal status: on going   3. Pt be I with voiding mechanics for improved bladder evacuation.  Baseline:  Goal status: MET 5/19  4.  Pt to report no urinary incontinence with urgency for improved tolerance to community outings.  Baseline:  Goal status: MET  5.  Pt to demonstrate no restrictions in lumbar spine for decreased strain at prolapse.  Baseline:  Goal status: on going   6.  Pt to demonstrate improved coordination of pelvic floor and breathing mechanics with 10# squat with appropriate synergistic patterns to decrease pain and leakage at least 75% of the time.    Baseline:  Goal status: MET 5/19  PLAN:  PT FREQUENCY: 1-2x/week  PT DURATION: 15 sessions  PLANNED INTERVENTIONS: 97110-Therapeutic exercises, 97530- Therapeutic activity, 97112- Neuromuscular re-education, 97535- Self Care, 40981- Manual therapy, Patient/Family education, Taping, Dry Needling, Joint mobilization, Spinal mobilization, Scar mobilization, DME instructions, Cryotherapy, Moist heat, and Biofeedback  PLAN FOR NEXT SESSION: trunk and hip stretching and core/hip strengthening, pressure management   Avie Lemme, PT, DPT 06/05/252:23 PM

## 2023-10-11 ENCOUNTER — Encounter: Admitting: Physical Therapy

## 2023-10-13 ENCOUNTER — Ambulatory Visit: Admitting: Physical Therapy

## 2023-10-13 DIAGNOSIS — M6281 Muscle weakness (generalized): Secondary | ICD-10-CM

## 2023-10-13 DIAGNOSIS — R293 Abnormal posture: Secondary | ICD-10-CM

## 2023-10-13 DIAGNOSIS — R279 Unspecified lack of coordination: Secondary | ICD-10-CM

## 2023-10-13 NOTE — Therapy (Signed)
 OUTPATIENT PHYSICAL THERAPY FEMALE PELVIC TREATMENT   Patient Name: MARQUEL SPOTO MRN: [id number] DOB:20-Apr-1951, 73 y.o., female Today's Date: 10/13/2023    END OF SESSION:  PT End of Session - 10/13/23 1018     Visit Number 11    Date for PT Re-Evaluation 11/08/23    Authorization Type [employer] MCR    Progress Note Due on Visit 20    PT Start Time 1015    PT Stop Time 1055    PT Time Calculation (min) 40 min    Activity Tolerance Patient tolerated treatment well    Behavior During Therapy [facility] for tasks assessed/performed            Past Medical History:  Diagnosis Date   Adenomatous colon polyp 10/1997, and 03/2009   Allergic rhinitis    Allergy     Aortic atherosclerosis (HCC)    Arthritis    osteo   Asthma    B12 deficiency    Chronic gastritis    Coronary artery calcification seen on CT scan    Fibromyalgia    GERD (gastroesophageal reflux disease)    Hemorrhoids    Hyperlipidemia    Hypertension    Menorrhagia    partial hysterectomy   Migraines    Osteoarthritis    PONV (postoperative nausea and vomiting)    Premature atrial contractions    Sleep disturbance    STD (sexually transmitted disease) 01/22/11 culture proven   HSV type I labia   Past Surgical History:  Procedure Laterality Date   CATARACT EXTRACTION Bilateral 10/16 & 11/16   EXCISION METACARPAL MASS Right 11/16/2016   Procedure: RIGHT LITTLE FINGER CYST EXCISION;  Surgeon: Rober Chimera, MD;  Location: [facility];  Service: Orthopedics;  Laterality: Right;   TONSILLECTOMY  age 48   tooth extract     with bone graft   Turbinate sinus surgery  2003   UPPER GI ENDOSCOPY  07/2022   VAGINAL HYSTERECTOMY  1998   secondary to prolapse, ovaries remain   Patient Active Problem List   Diagnosis Date Noted   Well woman exam with routine gynecological exam 05/23/2023   Female genital prolapse 05/23/2023   Fracture Risk Assessment Score (FRAX) indicating greater than 3% risk for hip  fracture 05/23/2023   Anxiety 05/21/2021   Dyslipidemia 05/21/2021   Dysphagia 05/21/2021   External hemorrhoids without complication 05/21/2021   Generalized anxiety disorder 05/21/2021   Hardening of the aorta (main artery of the heart) (HCC) 05/21/2021   History of hysterectomy 05/21/2021   Thyroid  nodule 05/21/2021   Kidney stone 05/21/2021   Multinodular goiter 05/21/2021   Osteopenia 05/21/2021   Pure hypercholesterolemia 05/21/2021   Sciatica 05/21/2021   Stress due to family tension 05/21/2021   Vitamin D  deficiency 05/21/2021   Cervical disc disease 05/21/2021   Unspecified thoracic, thoracolumbar and lumbosacral intervertebral disc disorder 05/21/2021   Dysosmia 12/18/2019   Cough variant asthma vs uacs  04/13/2018   Fibromyalgia 04/19/2016   Other fatigue 04/19/2016   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   Primary osteoarthritis of both knees 04/19/2016   Leg pain 01/22/2016   Restless legs syndrome 01/22/2016   Snoring 11/19/2015   Periodic limb movement sleep disorder 11/19/2015   GERD (gastroesophageal reflux disease) 10/08/2015   Mild persistent asthma 04/09/2015   LPRD (laryngopharyngeal reflux disease) 04/09/2015   Allergic rhinoconjunctivitis 04/09/2015   HEMATOCHEZIA 04/01/2010   COUGH 04/01/2010   Gastritis and gastroduodenitis 09/30/2009   NAUSEA 09/30/2009   ABDOMINAL PAIN-EPIGASTRIC 09/30/2009  DYSTHYMIC DISORDER 09/26/2009   PALPITATIONS 06/17/2009   Impingement syndrome of shoulder, left 06/04/2009   Constipation 02/05/2009   History of colonic polyps 02/05/2009   Asthma 02/04/2009   CHEST PAIN, ATYPICAL 02/04/2009   COLONIC POLYPS, BENIGN 12/18/2008   Hyperlipidemia LDL goal <70 12/18/2008   Insomnia 12/18/2008   Migraine without aura 12/18/2008   Essential hypertension 12/18/2008   Allergic rhinitis 12/18/2008   Primary osteoarthritis of both hands 12/18/2008   DJD (degenerative joint disease), cervical 12/18/2008   NECK PAIN, CHRONIC 12/18/2008    LOW BACK PAIN, CHRONIC 12/18/2008    PCP: 11/08/2023   REFERRING PROVIDER: Romaine Closs, MD   REFERRING DIAG: N81.9 (ICD-10-CM) - Female genital prolapse, unspecified type R33.9 (ICD-10-CM) - Urinary retention  THERAPY DIAG:  Muscle weakness (generalized)  Unspecified lack of coordination  Abnormal posture  Rationale for Evaluation and Treatment: Rehabilitation  ONSET DATE: 6 months   SUBJECTIVE:                                                                                                                                                                                           SUBJECTIVE STATEMENT: 80% better since starting PT with prolapse.  Reports overall feeling a lot better, had several questions about recommendations on what to do post discharge   PAIN:   Are you having pain? Yes NPRS scale: 2/10 Pain location: lt side of back worse but present bil  Pain type: soreness Pain description: dull   Aggravating factors: first getting up, mobility with first waking Relieving factors: time  PRECAUTIONS: None  RED FLAGS: None   WEIGHT BEARING RESTRICTIONS: No  FALLS:  Has patient fallen in last 6 months? No  OCCUPATION: retired   ACTIVITY LEVEL : house hold, minimal yard work   PLOF: Independent  PATIENT GOALS: to have stronger back and hips and less pain. Would like to have better tolerance to cooking and gardening for more than an hour to not have as much pain  PERTINENT HISTORY:  Fibromyalgia, Hemorrhoids, Hypertension, Osteoarthritis, VAGINAL HYSTERECTOMY, HSV type I labia, Anxiety Sexual abuse: No  BOWEL MOVEMENT: Pain with bowel movement: No Type of bowel movement:Type (Bristol Stool Scale) 6, Frequency every day, and Strain no Fully empty rectum: Yes:   Leakage: No Pads: No Fiber supplement/laxative No  URINATION: Pain with urination: No Fully empty bladder: Yes:   Stream: Strong and Weak Urgency: Yes  Frequency: not quicker than  every 2 hours; night time varies on fluid intake 1-2x Leakage: none Pads: No  INTERCOURSE:  Ability to have vaginal penetration Yes  Pain with intercourse: none DrynessYes  Climax: not painful  Marinoff Scale: 0/3 Lubricant - no  PREGNANCY: Vaginal deliveries 2 Tearing Yes: tearing and they put me to sleep Episiotomy No C-section deliveries 0 Currently pregnant No  PROLAPSE: None   OBJECTIVE:  Note: Objective measures were completed at Evaluation unless otherwise noted.  DIAGNOSTIC FINDINGS:  DG Lumbar Spine Complete 03/01/23  IMPRESSION: 1. Grade 1 anterolisthesis of L5 and S1 with slight loss of disc space height. 2. Facet hypertrophy in the lower lumbar spine.  PATIENT SURVEYS:    PFIQ-7: 71  09/05/23: PFIQ-7 19 09/28/23: PFIQ-7 14  COGNITION: Overall cognitive status: Within functional limits for tasks assessed     SENSATION: Light touch: Appears intact  LUMBAR SPECIAL TESTS:  Single leg stance test: unable to maintain balance without swaying more than 2s on Lt, 4s on right and  hip drop bil   FUNCTIONAL TESTS:  Functional squat - unable to complete without compensatory strategies (excessive forward flexion with minimal hip flexion, hands on thighs to return to standing, and decreased descent distance by 50%   GAIT: WFL  POSTURE: rounded shoulders, forward head, and posterior pelvic tilt   LUMBARAROM/PROM:  A/PROM A/PROM  eval  Flexion Limited by 75%  Extension Limited by 25%  Right lateral flexion Limited by 75%  Left lateral flexion Limited by 75%  Right rotation Limited by 75%  Left rotation Limited by 75%   (Blank rows = not tested)  LOWER EXTREMITY ROM:  Bil hamstrings, adductors and hip IR/ER limited by 50%  LOWER EXTREMITY MMT:  Bil hip abduction 3/5, all others 3+/5; knee 4+/5 PALPATION:   General: tightness at bil lumbar and thoracic paraspinals but worse at Lt side; bil piriformis and gluteal tightness but worse at Lt  Pelvic  Alignment: WFL  Abdominal: mild tightness in Rt mid/low quadrants but no TTP                External Perineal Exam: dryness, age related changes noted                             Internal Pelvic Floor: no TTP  Patient confirms identification and approves PT to assess internal pelvic floor and treatment Yes No emotional/communication barriers or cognitive limitation. Patient is motivated to learn. Patient understands and agrees with treatment goals and plan. PT explains patient will be examined in standing, sitting, and lying down to see how their muscles and joints work. When they are ready, they will be asked to remove their underwear so PT can examine their perineum. The patient is also given the option of providing their own chaperone as one is not provided in our facility. The patient also has the right and is explained the right to defer or refuse any part of the evaluation or treatment including the internal exam. With the patient's consent, PT will use one gloved finger to gently assess the muscles of the pelvic floor, seeing how well it contracts and relaxes and if there is muscle symmetry. After, the patient will get dressed and PT and patient will discuss exam findings and plan of care. PT and patient discuss plan of care, schedule, attendance policy and HEP activities.  PELVIC MMT:   MMT eval 09/28/23  Vaginal 1/5; 3s; 5 reps 3/5; 7s; 6 reps  Internal Anal Sphincter    External Anal Sphincter    Puborectalis    Diastasis Recti    (Blank rows = not tested)  TONE: Decreased   PROLAPSE: Possible grade 2 anterior wall laxity noted with cough in hooklying   09/28/23: grade 1 at rest in hooklying, grade 2 with cough but with cues for activation of pelvic floor was reduced. Possible rectocele (also reported with MD appointment 05/23/23) but not worsened with coughing in hooklying    TODAY'S TREATMENT:                                                                                                                               DATE:    09/28/23: Patient consented to internal pelvic floor reassessment vaginally this date and found to have improved strength, endurance, and coordination compared to evaluation. Pt does continue to have weakness and can still see anterior and posterior vaginal wall laxity with coughing but anterior wall improved since eval with minimal visualization at rest and reduced tissue descent with cough and pelvic floor activation compared to evaluation. Posterior laxity not worsened with cough. See details above for findings.  3x10 pelvic floor contractions X10 6-8s isometrics 2x10 quick flicks Pt had questions about medical management for prolapse symptoms and encouraged to reach out to MD about this..Pt educated on asking MD about theses recommendations and or a pessary.   10/06/23: Addaday L2 pt in prone - bil lumbar paraspinals, gluteals, and hamstrings for tissue mobility and decreased tension at pelvic floor and prolapse X10 cat/cow X10 tail wags X5 OH reach >lateral trunk stretch each way> forward trunk flexion stretches Nustep 8 mins L5 PT present to discuss progress and POC    10/13/23 Addaday L2 pt in prone - bil lumbar paraspinals, gluteals, and hamstrings for tissue mobility and decreased tension at pelvic floor and prolapse X5 OH reach >lateral trunk stretch each way> forward trunk flexion stretches Pt educated on continued HEP, prolapse relief positions, goals and discharge today   PATIENT EDUCATION:  Education details: [id number] Person educated: Patient Education method: Explanation, Demonstration, Tactile cues, Verbal cues, and Handouts Education comprehension: verbalized understanding, returned demonstration, verbal cues required, tactile cues required, and needs further education  HOME EXERCISE PROGRAM: [id number]  ASSESSMENT:  CLINICAL IMPRESSION: Patient is a 73 y.o. female  who was seen today for physical therapy treatment  for prolapse, back pain, urgency with urine. Pt session focused on low back mobility to decreased strain at pelvic floor and prolapse. Educated on all DC recommendations, and if pt needs additional PT in the future will need a new referral.   OBJECTIVE IMPAIRMENTS: decreased activity tolerance, decreased coordination, decreased endurance, decreased mobility, decreased ROM, decreased strength, increased fascial restrictions, increased muscle spasms, impaired flexibility, improper body mechanics, postural dysfunction, and pain.   ACTIVITY LIMITATIONS: carrying, lifting, standing, squatting, continence, and locomotion level  PARTICIPATION LIMITATIONS: interpersonal relationship and community activity  PERSONAL FACTORS: Age, Fitness, Time since onset of injury/illness/exacerbation, and 1 comorbidity: medical history are also affecting patient's functional outcome.   REHAB POTENTIAL: Good  CLINICAL DECISION MAKING: Stable/uncomplicated  EVALUATION COMPLEXITY:  Low   GOALS: Goals reviewed with patient? Yes  SHORT TERM GOALS: Target date: 09/06/23 *goals updated 09/28/23 Pt to be I with HEP.  Baseline: Goal status: MET  2.  Pt to demonstrate improved coordination of pelvic floor and breathing mechanics with body weight squat with appropriate synergistic patterns to decrease pain and leakage at least 50% of the time.    Baseline:  Goal status: MET  3.  Pt to demonstrate improved hip strengthening to at least 4/5 grossly at bil hips for improved pelvic stability and decreased strain at prolapse  Baseline:  Goal status: MET  4.  Pt to demonstrate improved upright posture for at least 30 mins for decreased strain at pelvic floor and improved core/back strength.  Baseline:  Goal status: MET   LONG TERM GOALS: Target date: 11/08/23  Pt to be I with advanced HEP.  Baseline:  Goal status: MET  2.  Pt to demonstrate at least 4+/5 bil hip strength for improved pelvic stability and functional  squats without increased pressure at pelvic floor .  Baseline:  Goal status: MET  3. Pt be I with voiding mechanics for improved bladder evacuation.  Baseline:  Goal status: MET 5/19  4.  Pt to report no urinary incontinence with urgency for improved tolerance to community outings.  Baseline:  Goal status: MET  5.  Pt to demonstrate no restrictions in lumbar spine for decreased strain at prolapse.  Baseline:  Goal status: not met   6.  Pt to demonstrate improved coordination of pelvic floor and breathing mechanics with 10# squat with appropriate synergistic patterns to decrease pain and leakage at least 75% of the time.    Baseline:  Goal status: MET 5/19  PLAN:  PT FREQUENCY: 1-2x/week  PT DURATION: 15 sessions  PLANNED INTERVENTIONS: 97110-Therapeutic exercises, 97530- Therapeutic activity, 97112- Neuromuscular re-education, 97535- Self Care, 11914- Manual therapy, Patient/Family education, Taping, Dry Needling, Joint mobilization, Spinal mobilization, Scar mobilization, DME instructions, Cryotherapy, Moist heat, and Biofeedback  PLAN FOR NEXT SESSION:   PHYSICAL THERAPY DISCHARGE SUMMARY  Visits from Start of Care: 12  Current functional level related to goals / functional outcomes: All STG met and 5/6 LTG met   Remaining deficits: Low back pain   Education / Equipment: HEP   Patient agrees to discharge. Patient goals were partially met. Patient is being discharged due to being pleased with the current functional level.   Avie Lemme, PT, DPT 10/12/2509:26 AM

## 2023-10-14 ENCOUNTER — Other Ambulatory Visit: Payer: Self-pay | Admitting: Physician Assistant

## 2023-10-17 ENCOUNTER — Telehealth: Payer: Self-pay

## 2023-10-17 NOTE — Telephone Encounter (Signed)
 Please print.  Unable to send electronically.

## 2023-10-17 NOTE — Telephone Encounter (Signed)
 Last Fill: 09/19/2023  Next Visit: 02/21/2024  Last Visit: 08/10/2023  Dx: Other insomnia   Current Dose per office note on 08/10/2023: Ambien  5 mg 1 tablet by mouth at bedtime for insomnia.   Okay to refill Ambien ?

## 2023-10-17 NOTE — Telephone Encounter (Signed)
 Opened in error

## 2023-10-18 ENCOUNTER — Ambulatory Visit: Admitting: Physical Therapy

## 2023-10-20 ENCOUNTER — Encounter: Admitting: Physical Therapy

## 2023-10-21 ENCOUNTER — Encounter: Payer: Self-pay | Admitting: Internal Medicine

## 2023-10-31 ENCOUNTER — Encounter: Payer: Self-pay | Admitting: Internal Medicine

## 2023-10-31 ENCOUNTER — Ambulatory Visit (AMBULATORY_SURGERY_CENTER): Admitting: Internal Medicine

## 2023-10-31 VITALS — BP 113/62 | HR 92 | Temp 98.0°F | Resp 18 | Ht 63.0 in | Wt 154.0 lb

## 2023-10-31 DIAGNOSIS — K2940 Chronic atrophic gastritis without bleeding: Secondary | ICD-10-CM

## 2023-10-31 DIAGNOSIS — R1013 Epigastric pain: Secondary | ICD-10-CM

## 2023-10-31 DIAGNOSIS — K222 Esophageal obstruction: Secondary | ICD-10-CM

## 2023-10-31 DIAGNOSIS — Z1211 Encounter for screening for malignant neoplasm of colon: Secondary | ICD-10-CM | POA: Diagnosis not present

## 2023-10-31 DIAGNOSIS — K219 Gastro-esophageal reflux disease without esophagitis: Secondary | ICD-10-CM

## 2023-10-31 DIAGNOSIS — K648 Other hemorrhoids: Secondary | ICD-10-CM | POA: Diagnosis not present

## 2023-10-31 DIAGNOSIS — K649 Unspecified hemorrhoids: Secondary | ICD-10-CM

## 2023-10-31 DIAGNOSIS — Z860100 Personal history of colon polyps, unspecified: Secondary | ICD-10-CM

## 2023-10-31 MED ORDER — SODIUM CHLORIDE 0.9 % IV SOLN: 500.0000 mL | Freq: Once | INTRAVENOUS | Status: DC

## 2023-10-31 MED ORDER — HYDROCORTISONE ACETATE 25 MG RE SUPP
25.0000 mg | Freq: Every day | RECTAL | 6 refills | Status: AC
Start: 2023-10-31 — End: ?

## 2023-10-31 NOTE — Patient Instructions (Addendum)
-  Handout on hemorrhoids provided. -repeat colonoscopy  in 5 year for surveillance recommended.  -Continue present medications.  YOU HAD AN ENDOSCOPIC PROCEDURE TODAY AT THE [facility]:   Refer to the procedure report that was given to you for any specific questions about what was found during the examination.  If the procedure report does not answer your questions, please call your gastroenterologist to clarify.  If you requested that your care partner not be given the details of your procedure findings, then the procedure report has been included in a sealed envelope for you to review at your convenience later.  YOU SHOULD EXPECT: Some feelings of bloating in the abdomen. Passage of more gas than usual.  Walking can help get rid of the air that was put into your GI tract during the procedure and reduce the bloating. If you had a lower endoscopy (such as a colonoscopy or flexible sigmoidoscopy) you may notice spotting of blood in your stool or on the toilet paper. If you underwent a bowel prep for your procedure, you may not have a normal bowel movement for a few days.  Please Note:  You might notice some irritation and congestion in your nose or some drainage.  This is from the oxygen used during your procedure.  There is no need for concern and it should clear up in a day or so.  SYMPTOMS TO REPORT IMMEDIATELY:  Following lower endoscopy (colonoscopy or flexible sigmoidoscopy):  Excessive amounts of blood in the stool  Significant tenderness or worsening of abdominal pains  Swelling of the abdomen that is new, acute  Fever of 100F or higher  Following upper endoscopy (EGD)  Vomiting of blood or coffee ground material  New chest pain or pain under the shoulder blades  Painful or persistently difficult swallowing  New shortness of breath  Fever of 100F or higher  Black, tarry-looking stools  For urgent or emergent issues, a gastroenterologist can be reached at any hour by  calling [phone]. Do not use MyChart messaging for urgent concerns.    DIET:  We do recommend a small meal at first, but then you may proceed to your regular diet.  Drink plenty of fluids but you should avoid alcoholic beverages for 24 hours.  ACTIVITY:  You should plan to take it easy for the rest of today and you should NOT DRIVE or use heavy machinery until tomorrow (because of the sedation medicines used during the test).    FOLLOW UP: Our staff will call the number listed on your records the next business day following your procedure.  We will call around 7:15- 8:00 am to check on you and address any questions or concerns that you may have regarding the information given to you following your procedure. If we do not reach you, we will leave a message.     If any biopsies were taken you will be contacted by phone or by letter within the next 1-3 weeks.  Please call us  at [phone] if you have not heard about the biopsies in 3 weeks.    SIGNATURES/CONFIDENTIALITY: You and/or your care partner have signed paperwork which will be entered into your electronic medical record.  These signatures attest to the fact that that the information above on your After Visit Summary has been reviewed and is understood.  Full responsibility of the confidentiality of this discharge information lies with you and/or your care-partner.

## 2023-10-31 NOTE — Progress Notes (Signed)
 Pt's states no medical or surgical changes since previsit or office visit.

## 2023-10-31 NOTE — Progress Notes (Signed)
 Expand All Collapse All        08/29/2023 Christina Deleon [id number] 12/04/1950     Chief Complaint: Discuss scheduling a colonoscopy and upper endoscopy   History of Present Illness: Christina Deleon. Christina Deleon is a 73 year old female with a past medical history of hypertension, hyperlipidemia, asthma, coronary artery calcifications, fibromyalgia, B12 deficiency, atrophic gastritis, GERD, IBS-D and colon polyps. Previously followed by Dr. Aneita. She presents today to discuss scheduling an EGD and colonoscopy. She endorses having epigastric ain for the past few months, stomach feels raw. She typically does not have heartburn but noted having heartburn for a few days. 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 No evidence of H. Pylori. Dr. Aneita recommended repeating an EGD in 3 years. She infrequently takes Diclofenac  for neck pain. Her bowel pattern has changed, stools soft, more frequent and sometimes sees nonbloody mucous on the stool. However, this past Fri, Sat and Sunday she did not pass a BM then passed a BM today. The shape of her stools have also changed, more narrow and stringy. She takes Dicyclomine  for IBS as needed, RX recently refilled by Dr. Abran. Her most recent colonoscopy was 09/17/2020 which identified one 12 mm and three 5 - 6 mm tubular adenomatous polyps removed from the colon. A repeat colonoscopy in 3 years was recommended.   Addendum: Labs from PCP dated 07/08/2023: WBC 4.9.  Hemoglobin 12.7.  Hematocrit 37.9.  MCV 89.5.  Platelet 287.  Glucose 94.  BUN 16.  Creatinine 0.92.  Sodium 143.  Potassium 3.9.  Albumin 4.0.  Total bili 0.4.  Alk phos 88.  AST 16.  ALT 9.  TSH 3.23.  Vitamin B12 > 1500.  Vitamin D  34.3.       Latest Ref Rng & Units 12/08/2020    8:55 AM 08/05/2020   10:00 AM 12/30/2019     2:59 PM  CBC  WBC 3.4 - 10.8 x10E3/uL 4.1  4.5  6.2   Hemoglobin 11.1 - 15.9 g/dL 87.4  86.7  86.4   Hematocrit 34.0 - 46.6 % 37.2  40.1  41.8   Platelets 150 - 450 x10E3/uL 262  344  241     B12 level 91 on 05/07/2022       Latest Ref Rng & Units 12/08/2020    8:55 AM 11/12/2020    9:56 AM 08/05/2020   10:00 AM  CMP  Glucose 65 - 99 mg/dL 898  84  78   BUN 8 - 27 mg/dL 15  14  15    Creatinine 0.57 - 1.00 mg/dL 9.07  9.08  9.19   Sodium 134 - 144 mmol/L 144  143  143   Potassium 3.5 - 5.2 mmol/L 4.1  3.9  4.7   Chloride 96 - 106 mmol/L 107  108  105   CO2 20 - 29 mmol/L 25  28  30    Calcium  8.7 - 10.3 mg/dL 9.2  9.2  9.2   Total Protein 6.0 - 8.5 g/dL 6.5  6.4  6.4   Total Bilirubin 0.0 - 1.2 mg/dL <9.7  0.3  0.4   Alkaline Phos 44 - 121 IU/L 77       AST 0 - 40 IU/L 20  16  18    ALT 0 - 32 IU/L 13  12  17    Parietal cell antibody  positive and intrinsic factor antibody negative on 08/18/2022 (Mildly elevated gastrin can be due to PPI use or atrophic gastritis among other conditions)   EGD 07/15/2022: - Benign-appearing esophageal stenosis. Dilated.  - Otherwise normal appearing esophagus. Biopsied.  - Small hiatal hernia. - Gastric mucosal atrophy. Biopsied.  - Normal duodenal bulb and second portion of the duodenum. 1. Surgical [P], gastric antrum and gastric body - CHRONIC INACTIVE GASTRITIS, WITH ATROPHY, INTESTINAL METAPLASIA, AND NEUROENDOCRINE CELL HYPERPLASIA (SEE NOTE) - NEGATIVE FOR H.PYLORI ON IMMUNOHISTOCHEMICAL STAIN - NEGATIVE FOR DYSPLASIA OR MALIGNANCY 2. Surgical [P], esophagus - BENIGN SQUAMOUS MUCOSA WITH MILD REFLUX CHANGES - NEGATIVE FOR INTESTINAL METAPLASIA, DYSPLASIA OR MALIGNANCY Diagnosis Note 1. Histologic sections of the stomach biopsies show antral type mucosa with an absence of oxyntic glands. There is an associated basal lymphoplasmacytic infiltrate and intestinal metaplasia. Together, these findings are suggestive of autoimmune gastritis (sometimes  also referred to as autoimmune metaplastic atrophic gastritis). An immunohistochemical stain for Helicobacter organisms is negative. 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 Correlation with clinical and laboratory findings is recommended.   Colonoscopy 09/17/2020: - One 12 mm polyp in the ascending colon, removed with a cold snare. Resected and retrieved.  - Three 5 to 6 mm polyps in the descending colon and in the ascending colon, removed with a cold snare. Resected and retrieved.  - Mild diverticulosis in the sigmoid colon.  - Internal hemorrhoids.  - The examination was otherwise normal on direct and retroflexion views. 3 year recall colonoscopy  Surgical [P], colon, ascending, descending, polyp (4) - TUBULAR ADENOMA (X4). - NEGATIVE FOR HIGH GRADE DYSPLASIA.       Past Medical History:  Diagnosis Date   Adenomatous colon polyp 10/1997, and 03/2009   Allergic rhinitis     Allergy      Aortic atherosclerosis (HCC)     Arthritis      osteo   Asthma     B12 deficiency     Chronic gastritis     Coronary artery calcification seen on CT scan     Fibromyalgia     GERD (gastroesophageal reflux disease)     Hemorrhoids     Hyperlipidemia     Hypertension     Menorrhagia      partial hysterectomy   Migraines     Osteoarthritis     PONV (postoperative nausea and vomiting)     Premature atrial contractions     Sleep disturbance     STD (sexually transmitted disease) 01/22/11 culture proven    HSV type I labia             Past Surgical History:  Procedure Laterality Date   CATARACT EXTRACTION Bilateral 10/16 & 11/16   EXCISION METACARPAL MASS Right 11/16/2016    Procedure: RIGHT LITTLE FINGER CYST EXCISION;  Surgeon: Sebastian Lenis, MD;  Location: [facility];  Service: Orthopedics;  Laterality: Right;   TONSILLECTOMY   age 69   tooth extract        with bone graft   Turbinate sinus surgery   2003   UPPER GI ENDOSCOPY   07/2022    VAGINAL HYSTERECTOMY   1998    secondary to prolapse, ovaries remain          Medications Ordered Prior to Encounter        Current Outpatient Medications on File Prior to Visit  Medication Sig Dispense Refill   ADVAIR  HFA 115-21 MCG/ACT inhaler INHALE 2 PUFFS BY MOUTH 1 TO 2  TIMES PER DAY 12 g 5   albuterol  (VENTOLIN  HFA) 108 (90 Base) MCG/ACT inhaler INHALE 2 PUFFS INTO THE LUNGS EVERY 6 HOURS AS NEEDED FOR WHEEZING OR SHORTNESS OF BREATH 6.7 g 1   ALPRAZolam (XANAX) 0.5 MG tablet as needed.       Ascorbic Acid (VITAMIN C PO) Take 1 tablet by mouth daily.       Azelastine -Fluticasone  137-50 MCG/ACT SUSP SHAKE LIQUID AND USE ONE SPRAY IN EACH NOSTRIL ONE TO TWO TIMES DAILY. 23 g 5   CALCIUM  PO Take 1 tablet by mouth daily.       candesartan (ATACAND) 16 MG tablet Take 8 mg by mouth at bedtime.       cetirizine  (ZYRTEC ) 10 MG tablet Take 1 tablet (10 mg total) by mouth daily. One tab daily for allergies 30 tablet 1   Cholecalciferol (VITAMIN D ) 2000 units tablet Take 2,000 Units by mouth daily.       clotrimazole  (MYCELEX ) 10 MG troche CAN USE 1 TROCHE IN MOUTH ONCE DAILY AS NEEDED 30 Troche 5   Cyanocobalamin (B-12 COMPLIANCE INJECTION IJ) Inject as directed. Once monthly       diclofenac  (CATAFLAM ) 50 MG tablet Take 1 tablet (50 mg total) by mouth 2 (two) times daily. 180 tablet 4   dicyclomine  (BENTYL ) 10 MG capsule Take 1 capsule (10 mg total) by mouth 3 (three) times daily as needed for spasms. 90 capsule 4   famotidine  (PEPCID ) 40 MG tablet Can take one tablet one to two times daily as directed. 180 tablet 1   hydrochlorothiazide (HYDRODIURIL) 12.5 MG tablet         ipratropium-albuterol  (DUONEB) 0.5-2.5 (3) MG/3ML SOLN Use one vial in the nebulizer every 4-6 hours if needed for cough or wheeze. 300 mL 1   Lifitegrast 5 % SOLN Apply 1 drop to eye 2 (two) times daily.       methocarbamol  (ROBAXIN ) 500 MG tablet TAKE 1 TABLET BY MOUTH DAILY AS NEEDED FOR MUSCLE SPASMS 30 tablet 2    montelukast  (SINGULAIR ) 10 MG tablet TAKE 1 TABLET(10 MG) BY MOUTH DAILY 90 tablet 1   Multiple Vitamin (MULTI-VITAMIN) tablet Take by mouth.       rosuvastatin  (CRESTOR ) 20 MG tablet TAKE 1 TABLET(20 MG) BY MOUTH DAILY 90 tablet 2   VITAMIN A PO Take by mouth daily.       zolmitriptan  (ZOMIG -ZMT) 5 MG disintegrating tablet Take 1 tablet (5 mg total) by mouth as needed for migraine. 10 tablet 11   zolpidem  (AMBIEN ) 5 MG tablet TAKE 1 TABLET(5 MG) BY MOUTH AT BEDTIME AS NEEDED FOR SLEEP 30 tablet 0    No current facility-administered medications on file prior to visit.      Allergies       Allergies  Allergen Reactions   Trazodone Hcl Other (See Comments)      REACTION: bad headaches   Clindamycin /Lincomycin Other (See Comments)      headaches   Penicillins Nausea Only and Rash      Patient has taken Amoxicillin  without an issue, per patient. Has patient had a PCN reaction causing immediate rash, facial/tongue/throat swelling, SOB or lightheadedness with hypotension: No Has patient had a PCN reaction causing severe rash involving mucus membranes or skin necrosis: No Has patient had a PCN reaction that required hospitalization No Has patient had a PCN reaction occurring within the last 10 years: No If all of the above answers are NO, then may proceed with Cephalosporin use.  Current Medications, Allergies, Past Medical History, Past Surgical History, Family History and Social History were reviewed in [employer] record.   Review of Systems:   Constitutional: Negative for fever, sweats, chills or weight loss.  Respiratory: Negative for shortness of breath.   Cardiovascular: Negative for chest pain, palpitations and leg swelling.  Gastrointestinal: See HPI.  Musculoskeletal:+ Neck pain. Neurological: Negative for dizziness, headaches or paresthesias.    Physical Exam: BP 122/68   Pulse 70   Ht 5' 3 (1.6 m)   Wt 154 lb (69.9 kg)   LMP  07/02/1994   BMI 27.28 kg/m    General: 73 year old female, appears younger than stated age, in no acute distress. Head: Normocephalic and atraumatic. Eyes: No scleral icterus. Conjunctiva pink . Ears: Normal auditory acuity. Mouth: Dentition intact. No ulcers or lesions.  Lungs: Clear throughout to auscultation. Heart: Regular rate and rhythm, no murmur. Abdomen: Soft, nontender and nondistended. No masses or hepatomegaly. Normal bowel sounds x 4 quadrants.  Rectal: Deferred.  Musculoskeletal: Symmetrical with no gross deformities. Extremities: No edema. Neurological: Alert oriented x 4. No focal deficits.  Psychological: Alert and cooperative. 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 Epigastric pain x 2 months. Infrequently takes Diclofenac . Patient wishes to undergo EGD at time of colonoscopy.  -EGD to reassess autoimmune gastritis and intestinal metaplasia and rule out PUD, benefits and risks discussed including risk with sedation, risk of bleeding, perforation and infection. Tentatively scheduled 10/31/2023.  -Dr. Abran to review case, to verify if ok to proceed with EGD at time of colonoscopy  -Famotidine  40mg  every day as needed   B 12 deficiency secondary to atrophic gastritis  - Patient to have routine labs including vitamin B12 level with PCP   GERD -GERD diet -See recommendations above regarding EGD    History of colon polyps. Colonoscopy was 09/17/2020 identified one 12 mm and three 5 - 6 mm tubular adenomatous polyps removed from the colon.  -Colonoscopy benefits and risks discussed including risk with sedation, risk of bleeding, perforation and infection    Altered bowel pattern -Benefiber one tablespoon daily -Colonoscopy as ordered above           Recent H&P as above.  No interval change.  Now for upper endoscopy and colonoscopy

## 2023-10-31 NOTE — Progress Notes (Signed)
 Report to PACU, RN, vss, BBS= Clear.

## 2023-10-31 NOTE — Op Note (Signed)
 [facility] Patient Name: Christina Deleon Procedure Date: 10/31/2023 8:55 AM MRN: [id number] Endoscopist: Norleen SAILOR. Abran , MD, [id number] Age: 73 Referring MD:  Date of Birth: [birth year] Gender: Female Account #: [id number] Procedure:                Upper GI endoscopy Indications:              Esophageal reflux globus sensation. No true                            esophageal dysphagia by history Medicines:                Monitored Anesthesia Care Procedure:                Pre-Anesthesia Assessment:                           - Prior to the procedure, a History and Physical                            was performed, and patient medications and                            allergies were reviewed. The patient's tolerance of                            previous anesthesia was also reviewed. The risks                            and benefits of the procedure and the sedation                            options and risks were discussed with the patient.                            All questions were answered, and informed consent                            was obtained. Prior Anticoagulants: The patient has                            taken no anticoagulant or antiplatelet agents. ASA                            Grade Assessment: II - A patient with mild systemic                            disease. After reviewing the risks and benefits,                            the patient was deemed in satisfactory condition to                            undergo the procedure.  After obtaining informed consent, the endoscope was                            passed under direct vision. Throughout the                            procedure, the patient's blood pressure, pulse, and                            oxygen saturations were monitored continuously. The                            GIF [id number] #[id number] was introduced through the                            mouth, and advanced to the second  part of duodenum.                            The upper GI endoscopy was accomplished without                            difficulty. The patient tolerated the procedure                            well. Scope In: Scope Out: Findings:                 The esophagus a distal esophageal ring but was                            otherwise normal.                           The stomach revealed atrophic gastric mucosa but                            was otherwise normal.                           The examined duodenum was normal.                           The cardia and gastric fundus were normal on                            retroflexion. Complications:            No immediate complications. Estimated Blood Loss:     Estimated blood loss: none. Impression:               1. Incidental esophageal ring                           2. Atrophic gastric mucosa                           3. Otherwise unremarkable EGD. Recommendation:           -  Patient has a contact number available for                            emergencies. The signs and symptoms of potential                            delayed complications were discussed with the                            patient. Return to normal activities tomorrow.                            Written discharge instructions were provided to the                            patient.                           - Resume previous diet.                           - Continue present medications.                           - Return to the care of your PCP Norleen SAILOR. Abran, MD 10/31/2023 9:45:06 AM This report has been signed electronically.

## 2023-10-31 NOTE — Op Note (Signed)
 [facility] Patient Name: Christina Deleon Procedure Date: 10/31/2023 9:04 AM MRN: [id number] Endoscopist: Norleen SAILOR. Abran , MD, [id number] Age: 72 Referring MD:  Date of Birth: [birth year] Gender: Female Account #: [id number] Procedure:                Colonoscopy Indications:              High risk colon cancer surveillance: Personal                            history of adenoma (10 mm or greater in size), High                            risk colon cancer surveillance: Personal history of                            multiple (3 or more) adenomas. Previous                            examinations with Dr. Aneita, most recently 2022 Medicines:                Monitored Anesthesia Care Procedure:                Pre-Anesthesia Assessment:                           - Prior to the procedure, a History and Physical                            was performed, and patient medications and                            allergies were reviewed. The patient's tolerance of                            previous anesthesia was also reviewed. The risks                            and benefits of the procedure and the sedation                            options and risks were discussed with the patient.                            All questions were answered, and informed consent                            was obtained. Prior Anticoagulants: The patient has                            taken no anticoagulant or antiplatelet agents. ASA                            Grade Assessment: II - A patient with mild systemic  disease. After reviewing the risks and benefits,                            the patient was deemed in satisfactory condition to                            undergo the procedure.                           After obtaining informed consent, the colonoscope                            was passed under direct vision. Throughout the                            procedure, the patient's  blood pressure, pulse, and                            oxygen saturations were monitored continuously. The                            Olympus [id number] ([id number]) Colonoscope was                            introduced through the anus and advanced to the the                            cecum, identified by appendiceal orifice and                            ileocecal valve. The ileocecal valve, appendiceal                            orifice, and rectum were photographed. The quality                            of the bowel preparation was excellent. The                            colonoscopy was performed without difficulty. The                            patient tolerated the procedure well. The bowel                            preparation used was SUPREP via split dose                            instruction. Scope In: 9:16:00 AM Scope Out: 9:28:41 AM Scope Withdrawal Time: 0 hours 9 minutes 7 seconds  Total Procedure Duration: 0 hours 12 minutes 41 seconds  Findings:                 Internal hemorrhoids were found during retroflexion.  The exam was otherwise without abnormality on                            direct and retroflexion views. Complications:            No immediate complications. Estimated blood loss:                            None. Estimated Blood Loss:     Estimated blood loss: none. Impression:               - Internal hemorrhoids.                           - The examination was otherwise normal on direct                            and retroflexion views.                           - No specimens collected. Recommendation:           - Repeat colonoscopy in 5 years for surveillance                            (personal history of multiple advanced adenomatous                            polyps).                           - Patient has a contact number available for                            emergencies. The signs and symptoms of potential                             delayed complications were discussed with the                            patient. Return to normal activities tomorrow.                            Written discharge instructions were provided to the                            patient.                           - Resume previous diet.                           - Continue present medications. Norleen SAILOR. Abran, MD 10/31/2023 9:42:26 AM This report has been signed electronically.

## 2023-11-01 ENCOUNTER — Telehealth: Payer: Self-pay | Admitting: *Deleted

## 2023-11-01 NOTE — Telephone Encounter (Signed)
  Follow up Call-     10/31/2023    8:15 AM 07/14/2022    9:19 AM  Call back number  Post procedure Call Back phone  # [phone]  Permission to leave phone message Yes Yes     Patient questions:  Do you have a fever, pain , or abdominal swelling? No. Pain Score  0 *  Have you tolerated food without any problems? Yes.    Have you been able to return to your normal activities? Yes.    Do you have any questions about your discharge instructions: Diet   No. Medications  No. Follow up visit  No.  Do you have questions or concerns about your Care? No.  Actions: * If pain score is 4 or above: No action needed, pain <4.

## 2023-11-18 ENCOUNTER — Other Ambulatory Visit: Payer: Self-pay

## 2023-11-18 MED ORDER — ZOLPIDEM TARTRATE 5 MG PO TABS: 5.0000 mg | ORAL_TABLET | Freq: Every evening | ORAL | 0 refills | Status: DC | PRN

## 2023-11-18 NOTE — Telephone Encounter (Signed)
 Last Fill: 10/17/2023  Next Visit: 02/21/2024  Last Visit: 08/10/2023  Dx: Insomnia   Current Dose per office note on 08/10/2023: Ambien  5 mg 1 tablet by mouth at bedtime   Okay to refill Ambien ?

## 2023-12-15 ENCOUNTER — Other Ambulatory Visit: Payer: Self-pay

## 2023-12-15 ENCOUNTER — Other Ambulatory Visit: Payer: Self-pay | Admitting: Allergy and Immunology

## 2023-12-15 MED ORDER — ZOLPIDEM TARTRATE 5 MG PO TABS: 5.0000 mg | ORAL_TABLET | Freq: Every evening | ORAL | 0 refills | Status: DC | PRN

## 2023-12-15 NOTE — Telephone Encounter (Signed)
 Refill request received via fax from [facility]- [address] for Ambien   Last Fill: 11/18/2023  Next Visit: 02/21/2024  Last Visit: 08/10/2023  Dx: Other insomnia   Current Dose per office note on 08/10/2023: Ambien  5 mg 1 tablet by mouth at bedtime for insomnia.   Okay to refill Ambien ?

## 2023-12-28 ENCOUNTER — Other Ambulatory Visit: Payer: Self-pay | Admitting: Allergy and Immunology

## 2024-01-05 ENCOUNTER — Other Ambulatory Visit: Payer: Self-pay | Admitting: Cardiology

## 2024-01-13 ENCOUNTER — Other Ambulatory Visit: Payer: Self-pay | Admitting: Physician Assistant

## 2024-01-13 ENCOUNTER — Telehealth: Payer: Self-pay | Admitting: Cardiology

## 2024-01-13 NOTE — Telephone Encounter (Signed)
 Last Fill: 12/15/2023   Next Visit: 02/21/2024   Last Visit: 08/10/2023   Dx: Insomnia    Current Dose per office note on 08/10/2023: Ambien  5 mg 1 tablet by mouth at bedtime    Okay to refill Ambien ?

## 2024-01-13 NOTE — Telephone Encounter (Signed)
*  STAT* If patient is at the pharmacy, call can be transferred to refill team.   1. Which medications need to be refilled? (please list name of each medication and dose if known)  rosuvastatin  (CRESTOR ) 20 MG tablet    2. Which pharmacy/location (including street and city if local pharmacy) is medication to be sent to? [facility] [phone] - , [place] - [address]    3. Do they need a 30 day or 90 day supply?  90 day supply

## 2024-01-19 ENCOUNTER — Ambulatory Visit (INDEPENDENT_AMBULATORY_CARE_PROVIDER_SITE_OTHER)
Admission: RE | Admit: 2024-01-19 | Discharge: 2024-01-19 | Disposition: A | Discharge status: Home / Self Care | Source: Ambulatory Visit | Attending: Allergy and Immunology | Admitting: Allergy and Immunology

## 2024-01-19 ENCOUNTER — Ambulatory Visit: Admitting: Allergy and Immunology

## 2024-01-19 ENCOUNTER — Ambulatory Visit: Payer: Self-pay | Admitting: Allergy and Immunology

## 2024-01-19 ENCOUNTER — Encounter (INDEPENDENT_AMBULATORY_CARE_PROVIDER_SITE_OTHER): Payer: Self-pay

## 2024-01-19 VITALS — BP 124/72 | HR 78 | Resp 16

## 2024-01-19 DIAGNOSIS — J4540 Moderate persistent asthma, uncomplicated: Secondary | ICD-10-CM | POA: Diagnosis not present

## 2024-01-19 DIAGNOSIS — J3089 Other allergic rhinitis: Secondary | ICD-10-CM | POA: Diagnosis not present

## 2024-01-19 DIAGNOSIS — K219 Gastro-esophageal reflux disease without esophagitis: Secondary | ICD-10-CM

## 2024-01-19 NOTE — Progress Notes (Signed)
 [place] [facility] [place] [facility] [place] [facility] [place] [facility] [place]   Follow-up Note  Referring Provider: Ransom Other, MD Primary Provider: Ransom Other, MD Date of Office Visit: 01/19/2024  Subjective:   Christina Deleon (DOB: 05/19/1950) is a 73 y.o. female who [facility] in re-evaluation of the following:  HPI: Jamy returns to this clinic in evaluation of asthma, allergic rhinitis, LPR, history of recurrent thrush.  I last saw her in this clinic 20 July 2023.  She has developed more cough over the course of the past 6 months and especially over the course of the past 3 months.  She feels as though she cannot clear out her throat and she intermittently loses her voice and on occasion she has some intermittent yellow phlegm.  She believes that her classic reflux is under very good control.  She continues on famotidine  just 1 time per day.  She is not really having any problems with chest tightness or wheezing and she she does not really have any problems with her nose.  She continues on nasal sprays and Advair  and montelukast .  Her mom apparently has pulmonary fibrosis and she is very worried that she may have that issue as well.  Allergies as of 01/19/2024       Reactions   Trazodone Hcl Other (See Comments)   REACTION: bad headaches   Clindamycin /lincomycin Other (See Comments)   headaches   Penicillins Nausea Only, Rash   Patient has taken Amoxicillin  without an issue, per patient. Has patient had a PCN reaction causing immediate rash, facial/tongue/throat swelling, SOB or lightheadedness with hypotension: No Has patient had a PCN reaction causing severe rash involving mucus membranes or skin necrosis: No Has patient had a PCN reaction that required hospitalization No Has patient had a PCN reaction occurring within the last 10 years: No If all of the above answers are NO, then may proceed with Cephalosporin use.        Medication  List    Advair  HFA 115-21 MCG/ACT inhaler Generic drug: fluticasone -salmeterol INHALE 2 PUFFS BY MOUTH 1 TO 2 TIMES PER DAY   albuterol  108 (90 Base) MCG/ACT inhaler Commonly known as: VENTOLIN  HFA INHALE 2 PUFFS INTO THE LUNGS EVERY 6 HOURS AS NEEDED FOR WHEEZING OR SHORTNESS OF BREATH   ALPRAZolam 0.5 MG tablet Commonly known as: XANAX as needed.   Azelastine -Fluticasone  137-50 MCG/ACT Susp SHAKE LIQUID AND USE ONE SPRAY IN EACH NOSTRIL ONE TO TWO TIMES DAILY.   B-12 COMPLIANCE INJECTION IJ Inject as directed. Once monthly   CALCIUM  PO Take 1 tablet by mouth daily.   candesartan 16 MG tablet Commonly known as: ATACAND Take 8 mg by mouth at bedtime.   cetirizine  10 MG tablet Commonly known as: ZYRTEC  Take 1 tablet (10 mg total) by mouth daily. One tab daily for allergies   clotrimazole  10 MG troche Commonly known as: MYCELEX  CAN USE 1 TROCHE IN THE MOUTH ONCE DAILY AS NEEDED   diclofenac  50 MG tablet Commonly known as: CATAFLAM  Take 1 tablet (50 mg total) by mouth 2 (two) times daily.   dicyclomine  10 MG capsule Commonly known as: BENTYL  Take 1 capsule (10 mg total) by mouth 3 (three) times daily as needed for spasms.   famotidine  40 MG tablet Commonly known as: Pepcid  Can take one tablet one to two times daily as directed.   hydrochlorothiazide 12.5 MG tablet Commonly known as: HYDRODIURIL   hydrocortisone  25 MG suppository Commonly known  as: ANUSOL -HC Place 1 suppository (25 mg total) rectally at bedtime. One per night for 2 weeks   ipratropium-albuterol  0.5-2.5 (3) MG/3ML Soln Commonly known as: DUONEB Use one vial in the nebulizer every 4-6 hours if needed for cough or wheeze.   Lifitegrast 5 % Soln Apply 1 drop to eye 2 (two) times daily.   methocarbamol  500 MG tablet Commonly known as: ROBAXIN  TAKE 1 TABLET BY MOUTH DAILY AS NEEDED FOR MUSCLE SPASMS   montelukast  10 MG tablet Commonly known as: SINGULAIR  TAKE 1 TABLET(10 MG) BY MOUTH DAILY    Multi-Vitamin tablet Take by mouth.   Na Sulfate-K Sulfate-Mg Sulfate concentrate 17.5-3.13-1.6 GM/177ML Soln Commonly known as: SUPREP Use as directed; may use generic; goodrx card if insurance will not cover generic   rosuvastatin  20 MG tablet Commonly known as: CRESTOR  TAKE 1 TABLET(20 MG) BY MOUTH DAILY   VITAMIN A PO Take by mouth daily.   VITAMIN C PO Take 1 tablet by mouth daily.   Vitamin D  50 MCG (2000 UT) tablet Take 2,000 Units by mouth daily.   zolmitriptan  5 MG disintegrating tablet Commonly known as: ZOMIG -ZMT Take 1 tablet (5 mg total) by mouth as needed for migraine.   zolpidem  5 MG tablet Commonly known as: AMBIEN  TAKE 1 TABLET(5 MG) BY MOUTH AT BEDTIME AS NEEDED FOR SLEEP    Past Medical History:  Diagnosis Date   Adenomatous colon polyp 10/1997, and 03/2009   Allergic rhinitis    Allergy     Aortic atherosclerosis (HCC)    Arthritis    osteo   Asthma    B12 deficiency    Chronic gastritis    Coronary artery calcification seen on CT scan    Fibromyalgia    GERD (gastroesophageal reflux disease)    Hemorrhoids    Hyperlipidemia    Hypertension    Menorrhagia    partial hysterectomy   Migraines    Osteoarthritis    PONV (postoperative nausea and vomiting)    Premature atrial contractions    Sleep disturbance    STD (sexually transmitted disease) 01/22/11 culture proven   HSV type I labia    Past Surgical History:  Procedure Laterality Date   CATARACT EXTRACTION Bilateral 10/16 & 11/16   EXCISION METACARPAL MASS Right 11/16/2016   Procedure: RIGHT LITTLE FINGER CYST EXCISION;  Surgeon: Sebastian Lenis, MD;  Location: [facility];  Service: Orthopedics;  Laterality: Right;   TONSILLECTOMY  age 53   tooth extract     with bone graft   Turbinate sinus surgery  2003   UPPER GI ENDOSCOPY  07/2022   VAGINAL HYSTERECTOMY  1998   secondary to prolapse, ovaries remain    Review of systems negative except as noted in HPI / PMHx  or noted below:  Review of Systems  Constitutional: Negative.   HENT: Negative.    Eyes: Negative.   Respiratory: Negative.    Cardiovascular: Negative.   Gastrointestinal: Negative.   Genitourinary: Negative.   Musculoskeletal: Negative.   Skin: Negative.   Neurological: Negative.   Endo/Heme/Allergies: Negative.   Psychiatric/Behavioral: Negative.       Objective:   Vitals:   01/19/24 1134  BP: 124/72  Pulse: 78  Resp: 16  SpO2: 97%          Physical Exam Constitutional:      Appearance: She is not diaphoretic.     Comments: Raspy voice, throat clearing, cough  HENT:     Head: Normocephalic.  Right Ear: Tympanic membrane, ear canal and external ear normal.     Left Ear: Tympanic membrane, ear canal and external ear normal.     Nose: Nose normal. No mucosal edema or rhinorrhea.     Mouth/Throat:     Pharynx: Uvula midline. No oropharyngeal exudate.  Eyes:     Conjunctiva/sclera: Conjunctivae normal.  Neck:     Thyroid : No thyromegaly.     Trachea: Trachea normal. No tracheal tenderness or tracheal deviation.  Cardiovascular:     Rate and Rhythm: Normal rate and regular rhythm.     Heart sounds: Normal heart sounds, S1 normal and S2 normal. No murmur heard. Pulmonary:     Effort: No respiratory distress.     Breath sounds: Normal breath sounds. No stridor. No wheezing or rales.  Lymphadenopathy:     Head:     Right side of head: No tonsillar adenopathy.     Left side of head: No tonsillar adenopathy.     Cervical: No cervical adenopathy.  Skin:    Findings: No erythema or rash.     Nails: There is no clubbing.  Neurological:     Mental Status: She is alert.     Diagnostics: none  Assessment and Plan:   1. Asthma, moderate persistent, well-controlled   2. Other allergic rhinitis   3. LPRD (laryngopharyngeal reflux disease)    1. Continue to treat reflux/LPR:    A. INCREASE Famotidine  40 mg - 2 times per day  B. Replace throat clearing with  swallowing/drinking maneuver  2. Continue to treat and prevent inflammation:   A. Dymista  -1 spray each nostril 2 times per day  B. montelukast  10mg  -1 time per day  C. Advair  115 HFA - 2 inhalations 2 times per day (empty lungs)  3. If needed:   A. ProAir  HFA  B. Zyrtec  10 mg daily   C. Clotrimazole  troches - 1 time per day  D. nasal saline E. Nebulizer - Duoneb- every 4-6 hours   4. Obtain the following evaluations:    A. Chest X-ray  B. Evaluation of throat with ENT  5. Return to clinic in 6 months or earlier if problem  6. Influenza = Tamiflu. Covid = Paxlovid   Anaiya is having some problems with her airway and certainly her throat is inflamed and we need to have ENT take a look at her throat to see if we are just dealing with LPR giving rise to this issue and we will have her increase her famotidine  to twice a day.  She has some concern given her family history of pulmonary fibrosis and we will check an x-ray as it has been about 7 years since her last x-ray.  Will get all that arranged soon and she will continue on a collection of anti-inflammatory agents for both her upper and lower airway and we will see her back in this clinic in 6 months or earlier if there is a problem.  Camellia Denis, MD Allergy  / Immunology [facility]

## 2024-01-19 NOTE — Patient Instructions (Addendum)
  1. Continue to treat reflux/LPR:    A. INCREASE Famotidine  40 mg - 2 times per day  B. Replace throat clearing with swallowing/drinking maneuver  2. Continue to treat and prevent inflammation:   A. Dymista  -1 spray each nostril 2 times per day  B. montelukast  10mg  -1 time per day  C. Advair  115 HFA - 2 inhalations 2 times per day (empty lungs)  3. If needed:   A. ProAir  HFA  B. Zyrtec  10 mg daily   C. Clotrimazole  troches - 1 time per day  D. nasal saline E. Nebulizer - Duoneb- every 4-6 hours   4. Obtain the following evaluations:    A. Chest X-ray  B. Evaluation of throat with ENT  5. Return to clinic in 6 months or earlier if problem  6. Influenza = Tamiflu. Covid = Paxlovid

## 2024-01-23 ENCOUNTER — Encounter: Payer: Self-pay | Admitting: Allergy and Immunology

## 2024-02-07 NOTE — Progress Notes (Signed)
 Office Visit Note  Patient: Christina Deleon             Date of Birth: Sep 27, 1950           MRN: [id number]             PCP: Ransom Other, MD Referring: Ransom Other, MD Visit Date: 02/21/2024 Occupation: Data Unavailable  Subjective:  Pain in joints and muscles  History of Present Illness: Christina Deleon is a 73 y.o. female with osteoarthritis, degenerative disc disease and fibromyalgia syndrome.  She returns today after her last visit in April 2025.  She states she continues to have discomfort in her left hand which she describes over the [facility] joint.  She gives a history of intermittent swelling.  She states she has decreased grip strength.  She continues to have some generalized pain and discomfort from fibromyalgia.  She gives history of fatigue and nocturnal pain.  She reports dry eyes.  She was recently having more palpitations and wearing a heart monitor.  She takes Ambien  5 mg p.o. nightly for insomnia and is still has sometimes not restful sleep.  States she sleeps better with Ambien  than without the Ambien .    Activities of Daily Living:  Patient reports morning stiffness for 1 hour.   Patient Reports nocturnal pain.  Difficulty dressing/grooming: Reports Difficulty climbing stairs: Reports Difficulty getting out of chair: Reports Difficulty using hands for taps, buttons, cutlery, and/or writing: Reports  Review of Systems  Constitutional:  Positive for fatigue.  HENT:  Negative for mouth sores and mouth dryness.   Eyes:  Positive for dryness.  Respiratory:  Positive for shortness of breath.   Cardiovascular:  Positive for palpitations. Negative for chest pain.  Gastrointestinal:  Negative for blood in stool, constipation and diarrhea.  Endocrine: Negative for increased urination.  Genitourinary:  Negative for involuntary urination.  Musculoskeletal:  Positive for joint pain, gait problem, joint pain, joint swelling, myalgias, muscle weakness, morning stiffness, muscle  tenderness and myalgias.  Skin:  Negative for color change, rash, hair loss and sensitivity to sunlight.  Allergic/Immunologic: Negative for susceptible to infections.  Neurological:  Positive for numbness and headaches. Negative for dizziness.  Hematological:  Negative for swollen glands.  Psychiatric/Behavioral:  Positive for depressed mood and sleep disturbance. The patient is nervous/anxious.     PMFS History:  Patient Active Problem List   Diagnosis Date Noted   Well woman exam with routine gynecological exam 05/23/2023   Female genital prolapse 05/23/2023   Fracture Risk Assessment Score (FRAX) indicating greater than 3% risk for hip fracture 05/23/2023   Anxiety 05/21/2021   Dyslipidemia 05/21/2021   Dysphagia 05/21/2021   External hemorrhoids without complication 05/21/2021   Generalized anxiety disorder 05/21/2021   Hardening of the aorta (main artery of the heart) 05/21/2021   History of hysterectomy 05/21/2021   Thyroid  nodule 05/21/2021   Kidney stone 05/21/2021   Multinodular goiter 05/21/2021   Osteopenia 05/21/2021   Pure hypercholesterolemia 05/21/2021   Sciatica 05/21/2021   Stress due to family tension 05/21/2021   Vitamin D  deficiency 05/21/2021   Cervical disc disease 05/21/2021   Unspecified thoracic, thoracolumbar and lumbosacral intervertebral disc disorder 05/21/2021   Dysosmia 12/18/2019   Cough variant asthma vs uacs  04/13/2018   Fibromyalgia 04/19/2016   Other fatigue 04/19/2016   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   Primary osteoarthritis of both knees 04/19/2016   Leg pain 01/22/2016   Restless legs syndrome 01/22/2016   Snoring 11/19/2015   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   GERD (gastroesophageal reflux disease) 10/08/2015   Mild persistent asthma 04/09/2015   LPRD (laryngopharyngeal reflux disease) 04/09/2015   Allergic rhinoconjunctivitis 04/09/2015   HEMATOCHEZIA 04/01/2010   COUGH 04/01/2010   Gastritis and gastroduodenitis  09/30/2009   NAUSEA 09/30/2009   ABDOMINAL PAIN-EPIGASTRIC 09/30/2009   DYSTHYMIC DISORDER 09/26/2009   PALPITATIONS 06/17/2009   Impingement syndrome of shoulder, left 06/04/2009   Constipation 02/05/2009   History of colonic polyps 02/05/2009   Asthma 02/04/2009   CHEST PAIN, ATYPICAL 02/04/2009   COLONIC POLYPS, BENIGN 12/18/2008   Hyperlipidemia LDL goal <70 12/18/2008   Insomnia 12/18/2008   Migraine without aura 12/18/2008   Essential hypertension 12/18/2008   Allergic rhinitis 12/18/2008   Primary osteoarthritis of both hands 12/18/2008   DJD (degenerative joint disease), cervical 12/18/2008   NECK PAIN, CHRONIC 12/18/2008   LOW BACK PAIN, CHRONIC 12/18/2008    Past Medical History:  Diagnosis Date   Adenomatous colon polyp 10/1997, and 03/2009   Allergic rhinitis    Allergy     Aortic atherosclerosis    Arthritis    osteo   Asthma    B12 deficiency    Chronic gastritis    Coronary artery calcification seen on CT scan    Fibromyalgia    GERD (gastroesophageal reflux disease)    Hemorrhoids    Hyperlipidemia    Hypertension    Menorrhagia    partial hysterectomy   Migraines    Osteoarthritis    PONV (postoperative nausea and vomiting)    Premature atrial contractions    Sleep disturbance    STD (sexually transmitted disease) 01/22/11 culture proven   HSV type I labia    Family History  Problem Relation Age of Onset   Coronary artery disease Mother    Hypertension Mother    Hyperlipidemia Mother    Pulmonary fibrosis Mother    Lung disease Mother    Throat cancer Father    Coronary artery disease Father    Heart attack Father 24   Stroke Father        during procedure   Prostate cancer Brother    Heart disease Brother    Diabetes Brother    Diabetes Paternal Aunt        x 2   Prostate cancer Paternal Uncle    Stroke Maternal Grandmother        or MI   Diabetes Paternal Grandmother    Coronary artery disease Paternal Grandmother    Migraines  Daughter    Migraines Daughter    Colon cancer Neg Hx    Past Surgical History:  Procedure Laterality Date   CATARACT EXTRACTION Bilateral 10/16 & 11/16   EXCISION METACARPAL MASS Right 11/16/2016   Procedure: RIGHT LITTLE FINGER CYST EXCISION;  Surgeon: Sebastian Lenis, MD;  Location:  SURGERY CENTER;  Service: Orthopedics;  Laterality: Right;   TONSILLECTOMY  age 4   tooth extract     with bone graft   Turbinate sinus surgery  2003   UPPER GI ENDOSCOPY  07/2022   VAGINAL HYSTERECTOMY  1998   secondary to prolapse, ovaries remain   Social History   Tobacco Use   Smoking status: Never    Passive exposure: Past   Smokeless tobacco: Never   Tobacco comments:    Father growing up  Vaping Use   Vaping status: Never Used  Substance Use Topics   Alcohol use: No    Alcohol/week: 0.0 standard drinks of alcohol  Drug use: Never   Social History   Social History Narrative   She lives with husband.  They have 2 grown children.   She is retired Psychiatric nurse.   Highest level of education:  2 years of college   Right handed   Regular exercise, diet of fruits, veggies, limited fried foods, limited water      [facility]:   Originally from [place]. Has also lived in [place]  & [place]. Previously worked doing Warden/ranger. No pets currently. No bird, mold, or hot tub exposure. Does have a musty smell in their home but it is new. Enjoys reading. 1 indoor plant. Has carpet in her home including in the bedroom. Also has draperies.    Right handed     Immunization History  Administered Date(s) Administered   Fluzone  Influenza virus vaccine,trivalent (IIV3), split virus 04/02/2014   Influenza, Seasonal, Injecte, Preservative Fre 04/09/2015   Influenza,inj,Quad PF,6+ Mos 01/14/2016, 03/04/2017, 01/24/2018   Influenza-Unspecified 02/08/2019, 02/25/2020, 02/17/2021   PFIZER(Purple Top)SARS-COV-2 Vaccination 06/23/2019, 07/18/2019, 04/04/2020    Pneumococcal Conjugate-13 11/01/2015, 11/25/2015   Pneumococcal Polysaccharide-23 11/20/2013   Tdap 11/20/2009     Objective: Vital Signs: BP 131/76   Pulse 80   Temp 97.9 F (36.6 C)   Resp 14   Ht 5' 4 (1.626 m)   Wt 150 lb (68 kg)   LMP 07/02/1994   BMI 25.75 kg/m    Physical Exam Vitals and nursing note reviewed.  Constitutional:      Appearance: She is well-developed.  HENT:     Head: Normocephalic and atraumatic.  Eyes:     Conjunctiva/sclera: Conjunctivae normal.  Cardiovascular:     Rate and Rhythm: Normal rate and regular rhythm.     Heart sounds: Normal heart sounds.  Pulmonary:     Effort: Pulmonary effort is normal.     Breath sounds: Normal breath sounds.  Abdominal:     General: Bowel sounds are normal.     Palpations: Abdomen is soft.  Musculoskeletal:     Cervical back: Normal range of motion.  Lymphadenopathy:     Cervical: No cervical adenopathy.  Skin:    General: Skin is warm and dry.     Capillary Refill: Capillary refill takes less than 2 seconds.  Neurological:     Mental Status: She is alert and oriented to person, place, and time.  Psychiatric:        Behavior: Behavior normal.      Musculoskeletal Exam: She had limited range of motion of cervical, thoracic and lumbar spine with some discomfort on range of motion.  There was no SI joint tenderness.  Shoulder joints, elbow joints, wrist joints, MCPs, PIPs and DIPs were in good range of motion with no synovitis.  Bilateral DIP thickening was noted.  She had tenderness over left CMC joint.  Hip joints and knee joints were in good range of motion without any warmth swelling or effusion.  There was no tenderness over ankles or MTPs.  She had generalized hyperalgesia and positive tender points.   CDAI Exam: CDAI Score: -- Patient Global: --; Provider Global: -- Swollen: --; Tender: -- Joint Exam 02/21/2024   No joint exam has been documented for this visit   There is currently no  information documented on the homunculus. Go to the Rheumatology activity and complete the homunculus joint exam.  Investigation: No additional findings.  Imaging: No results found.   Recent Labs: Lab Results  Component Value Date   WBC 4.8  02/08/2024   HGB 13.1 02/08/2024   PLT 291 02/08/2024   NA 144 02/08/2024   K 4.5 02/08/2024   CL 104 02/08/2024   CO2 27 02/08/2024   GLUCOSE 82 02/08/2024   BUN 17 02/08/2024   CREATININE 0.98 02/08/2024   BILITOT <0.2 12/08/2020   ALKPHOS 77 12/08/2020   AST 20 12/08/2020   ALT 13 12/08/2020   PROT 6.5 12/08/2020   ALBUMIN 3.8 12/08/2020   CALCIUM  9.8 02/08/2024   GFRAA 87 08/05/2020    Speciality Comments: No specialty comments available.  Procedures:  No procedures performed Allergies: Trazodone hcl, Clindamycin /lincomycin, and Penicillins   Assessment / Plan:     Visit Diagnoses: Fibromyalgia-she continues to have generalized pain and discomfort from fibromyalgia.  She states she gets frequent flares of fibromyalgia.  She is on methocarbamol  500 mg p.o. daily as needed .  Need for regular exercise and stretching was discussed.  Other insomnia -she is able to sleep better with Ambien  5 mg 1 tablet by mouth at bedtime for insomnia.  Other fatigue-she continues to have fatigue.  DDD (degenerative disc disease), cervical -she complains of chronic discomfort.  She takes Diclofenac  50 mg 1 tablet twice daily as needed for pain relief.   DDD lumbar spine -chronic pain.  A handout on back exercises was given.  X-rays 03/01/2023 revealed grade 1 anterolisthesis of L5-S1 with slight loss of disc space height.  Facet hypertrophy in the lower lumbar spine noted.  Chronic pain of both shoulders-she had good range of motion of bilateral shoulders.  Primary osteoarthritis of both hands-she has osteoarthritis in both hands with significant thickening of bilateral DIP joints.  She also had tenderness over left CMC joint.  Labs obtained at  the last visit were unremarkable.  X-rays were suggestive of osteoarthritis.  A prescription for left CMC brace was given.  A handout on muscle strength exercise was given.  Trochanteric bursitis of both hips-she has intermittent pain in the trochanteric region.  IT band stretches were discussed.  Primary osteoarthritis of both knees-she complains of intermittent discomfort in her knee joints.  No warmth swelling or effusion was noted.  Lower extremity muscle skin exercise was discussed.  History of hypertension-blood pressure was normal at 131/76 today.  Patient has been experiencing palpitations or wearing a cardiac monitor.  Other medical problems are listed as follows:  History of asthma  History of gastroesophageal reflux (GERD)  History of colon polyps  History of migraine  History of hyperlipidemia  B12 deficiency  Orders: No orders of the defined types were placed in this encounter.  No orders of the defined types were placed in this encounter.    Follow-Up Instructions: Return in about 6 months (around 08/21/2024) for Osteoarthritis.   Maya Nash, MD  Note - This record has been created using Animal nutritionist.  Chart creation errors have been sought, but may not always  have been located. Such creation errors do not reflect on  the standard of medical care.

## 2024-02-08 ENCOUNTER — Ambulatory Visit (INDEPENDENT_AMBULATORY_CARE_PROVIDER_SITE_OTHER)

## 2024-02-08 ENCOUNTER — Ambulatory Visit: Attending: Cardiology | Admitting: Nurse Practitioner

## 2024-02-08 ENCOUNTER — Encounter: Payer: Self-pay | Admitting: Nurse Practitioner

## 2024-02-08 VITALS — BP 120/80 | HR 83 | Ht 64.0 in | Wt 149.0 lb

## 2024-02-08 DIAGNOSIS — R42 Dizziness and giddiness: Secondary | ICD-10-CM | POA: Diagnosis not present

## 2024-02-08 DIAGNOSIS — I10 Essential (primary) hypertension: Secondary | ICD-10-CM

## 2024-02-08 DIAGNOSIS — R931 Abnormal findings on diagnostic imaging of heart and coronary circulation: Secondary | ICD-10-CM | POA: Diagnosis not present

## 2024-02-08 DIAGNOSIS — R053 Chronic cough: Secondary | ICD-10-CM

## 2024-02-08 DIAGNOSIS — R002 Palpitations: Secondary | ICD-10-CM

## 2024-02-08 DIAGNOSIS — I491 Atrial premature depolarization: Secondary | ICD-10-CM

## 2024-02-08 DIAGNOSIS — I700 Atherosclerosis of aorta: Secondary | ICD-10-CM

## 2024-02-08 DIAGNOSIS — E785 Hyperlipidemia, unspecified: Secondary | ICD-10-CM

## 2024-02-08 NOTE — Progress Notes (Unsigned)
 Patient enrolled for [facility] [employer]/ [employer] to ship a 14 day long term monitor .  Sensitive skin (Hydrocolloid strips requested).  Dr. Jeffrie to read.

## 2024-02-08 NOTE — Progress Notes (Signed)
 Office Visit    Patient Name: Christina Deleon Date of Encounter: 02/08/2024  Primary Care Provider:  Ransom Other, MD Primary Cardiologist:  Oneil Parchment, MD  Chief Complaint    73 year old female with a history of elevated coronary artery calcium  score, aortic atherosclerosis, palpitations, PACs, hypertension, hyperlipidemia, arthritis, fibromyalgia, and GERD who presents for follow-up related to palpitations.  Past Medical History    Past Medical History:  Diagnosis Date   Adenomatous colon polyp 10/1997, and 03/2009   Allergic rhinitis    Allergy     Aortic atherosclerosis    Arthritis    osteo   Asthma    B12 deficiency    Chronic gastritis    Coronary artery calcification seen on CT scan    Fibromyalgia    GERD (gastroesophageal reflux disease)    Hemorrhoids    Hyperlipidemia    Hypertension    Menorrhagia    partial hysterectomy   Migraines    Osteoarthritis    PONV (postoperative nausea and vomiting)    Premature atrial contractions    Sleep disturbance    STD (sexually transmitted disease) 01/22/11 culture proven   HSV type I labia   Past Surgical History:  Procedure Laterality Date   CATARACT EXTRACTION Bilateral 10/16 & 11/16   EXCISION METACARPAL MASS Right 11/16/2016   Procedure: RIGHT LITTLE FINGER CYST EXCISION;  Surgeon: Sebastian Lenis, MD;  Location: [facility];  Service: Orthopedics;  Laterality: Right;   TONSILLECTOMY  age 26   tooth extract     with bone graft   Turbinate sinus surgery  2003   UPPER GI ENDOSCOPY  07/2022   VAGINAL HYSTERECTOMY  1998   secondary to prolapse, ovaries remain    Allergies  Allergies  Allergen Reactions   Trazodone Hcl Other (See Comments)    REACTION: bad headaches   Clindamycin /Lincomycin Other (See Comments)    headaches   Penicillins Nausea Only and Rash    Patient has taken Amoxicillin  without an issue, per patient. Has patient had a PCN reaction causing immediate rash,  facial/tongue/throat swelling, SOB or lightheadedness with hypotension: No Has patient had a PCN reaction causing severe rash involving mucus membranes or skin necrosis: No Has patient had a PCN reaction that required hospitalization No Has patient had a PCN reaction occurring within the last 10 years: No If all of the above answers are NO, then may proceed with Cephalosporin use.      Labs/Other Studies Reviewed    The following studies were reviewed today:  Cardiac Studies & Procedures   ______________________________________________________________________________________________     ECHOCARDIOGRAM  ECHOCARDIOGRAM COMPLETE 12/04/2019  Narrative ECHOCARDIOGRAM REPORT    Patient Name:   Christina Deleon  Date of Exam: 12/04/2019 Medical Rec #:  [id number]     Height:       64.0 in Accession #:    [id number]    Weight:       154.0 lb Date of Birth:  Feb 22, 1951     BSA:          1.751 m Patient Age:    68 years      BP:           140/82 mmHg Patient Gender: F             HR:           75 bpm. Exam Location:  [address]  Procedure: 2D Echo, 3D Echo, Cardiac Doppler, Color Doppler and Strain Analysis  Indications:  R06.00 Dyspnea  History:        Patient has no prior history of Echocardiogram examinations. Risk Factors:Hypertension and Dyslipidemia.  Sonographer:    Carl Coma RDCS Referring Phys: [phone] [facility]  IMPRESSIONS   1. Left ventricular ejection fraction, by estimation, is 60 to 65%. The left ventricle has normal function. The left ventricle has no regional wall motion abnormalities. Left ventricular diastolic parameters are consistent with Grade I diastolic dysfunction (impaired relaxation). 2. Right ventricular systolic function is normal. The right ventricular size is normal. 3. The mitral valve is normal in structure. No evidence of mitral valve regurgitation. No evidence of mitral stenosis. 4. The aortic valve is tricuspid. Aortic valve  regurgitation is not visualized. Mild aortic valve sclerosis is present, with no evidence of aortic valve stenosis. 5. The inferior vena cava is normal in size with greater than 50% respiratory variability, suggesting right atrial pressure of 3 mmHg.  FINDINGS Left Ventricle: Left ventricular ejection fraction, by estimation, is 60 to 65%. The left ventricle has normal function. The left ventricle has no regional wall motion abnormalities. The left ventricular internal cavity size was normal in size. There is no left ventricular hypertrophy. Left ventricular diastolic parameters are consistent with Grade I diastolic dysfunction (impaired relaxation).  Right Ventricle: The right ventricular size is normal.Right ventricular systolic function is normal.  Left Atrium: Left atrial size was normal in size.  Right Atrium: Right atrial size was normal in size.  Pericardium: There is no evidence of pericardial effusion.  Mitral Valve: The mitral valve is normal in structure. Normal mobility of the mitral valve leaflets. No evidence of mitral valve regurgitation. No evidence of mitral valve stenosis.  Tricuspid Valve: The tricuspid valve is normal in structure. Tricuspid valve regurgitation is trivial. No evidence of tricuspid stenosis.  Aortic Valve: The aortic valve is tricuspid. Aortic valve regurgitation is not visualized. Mild aortic valve sclerosis is present, with no evidence of aortic valve stenosis.  Pulmonic Valve: The pulmonic valve was normal in structure. Pulmonic valve regurgitation is trivial. No evidence of pulmonic stenosis.  Aorta: The aortic root is normal in size and structure.  Venous: The inferior vena cava is normal in size with greater than 50% respiratory variability, suggesting right atrial pressure of 3 mmHg.  IAS/Shunts: No atrial level shunt detected by color flow Doppler.   LEFT VENTRICLE PLAX 2D LVIDd:         3.50 cm  Diastology LVIDs:         2.20 cm  LV e'  lateral:   6.13 cm/s LV PW:         0.90 cm  LV E/e' lateral: 11.5 LV IVS:        0.90 cm  LV e' medial:    6.31 cm/s LVOT diam:     1.90 cm  LV E/e' medial:  11.2 LV SV:         57 LV SV Index:   32       2D Longitudinal Strain LVOT Area:     2.84 cm 2D Strain GLS (A2C):   -21.2 % 2D Strain GLS (A3C):   -19.1 % 2D Strain GLS (A4C):   -18.5 % 2D Strain GLS Avg:     -19.6 %  3D Volume EF: 3D EF:        57 % LV EDV:       86 ml LV ESV:       37 ml LV SV:  49 ml  RIGHT VENTRICLE RV Basal diam:  3.30 cm RV S prime:     12.70 cm/s TAPSE (M-mode): 2.4 cm  LEFT ATRIUM             Index       RIGHT ATRIUM          Index LA diam:        3.20 cm 1.83 cm/m  RA Area:     8.90 cm LA Vol (A2C):   40.1 ml 22.91 ml/m RA Volume:   17.60 ml 10.05 ml/m LA Vol (A4C):   26.1 ml 14.91 ml/m LA Biplane Vol: 35.1 ml 20.05 ml/m AORTIC VALVE LVOT Vmax:   92.30 cm/s LVOT Vmean:  66.300 cm/s LVOT VTI:    0.200 m  AORTA Ao Root diam: 2.60 cm Ao Asc diam:  3.40 cm  MITRAL VALVE MV Area (PHT): 3.72 cm    SHUNTS MV Decel Time: 204 msec    Systemic VTI:  0.20 m MV E velocity: 70.70 cm/s  Systemic Diam: 1.90 cm MV A velocity: 90.00 cm/s MV E/A ratio:  0.79  Redell Shallow MD Electronically signed by Redell Shallow MD Signature Date/Time: 12/04/2019/1:30:40 PM    Final    MONITORS  CARDIAC EVENT MONITOR 12/18/2019  Narrative  Sinus rhythm  Rare PAC's  Symptom of flutter did not corrlate with significant arrhythmia. On one occasion there was an isolated OAC  No sustained atrial or ventricular rhythms   CT SCANS  CT CARDIAC SCORING (SELF PAY ONLY) 12/04/2019  Addendum 12/05/2019  6:10 AM ADDENDUM REPORT: 12/05/2019 06:08  CLINICAL DATA:  Risk stratification - 73 year old female with family history of CAD  EXAM: Coronary Calcium  Score  TECHNIQUE: The patient was scanned on a [employer] scanner. Axial non-contrast 3 mm slices were carried out through the heart.  The data set was analyzed on a dedicated work station and scored using the Agatson method.  FINDINGS: Non-cardiac: See separate report from [facility].  Ascending Aorta: Scattered descending aortic atherosclerosis  Pericardium: Normal  Coronary arteries: Normal origins. Distal left main, proximal LAD and RCA calcification.  IMPRESSION: 1. Coronary calcium  score of 177. This was 73 percentile for age and sex matched control.  2.  Descending aortic atherosclerosis.  Oneil Parchment, MD [facility]   Electronically Signed By: Oneil Parchment MD On: 12/05/2019 06:08  Narrative EXAM: OVER-READ INTERPRETATION  CT CHEST  The following report is an over-read performed by radiologist Dr. Franky Crease of [facility] on 12/04/2019. This over-read does not include interpretation of cardiac or coronary anatomy or pathology. The coronary calcium  score interpretation by the cardiologist is attached.  COMPARISON:  None.  FINDINGS: Vascular: Heart is normal size. Aorta normal caliber. Scattered aortic calcifications in the descending thoracic aorta.  Mediastinum/Nodes: No adenopathy.  Lungs/Pleura: Scarring in the right middle lobe and lingula inferiorly. No effusions.  Upper Abdomen: No acute findings.  Musculoskeletal: Chest wall soft tissues are unremarkable. No acute bony abnormality.  IMPRESSION: No acute extra cardiac abnormality.  Scattered aortic atherosclerosis.  Electronically Signed: By: Franky Crease M.D. On: 12/04/2019 09:13     ______________________________________________________________________________________________     Recent Labs: No results found for requested labs within last 365 days.  Recent Lipid Panel    Component Value Date/Time   CHOL 130 12/08/2020 0855   TRIG 71 12/08/2020 0855   HDL 57 12/08/2020 0855   CHOLHDL 2.3 12/08/2020 0855   CHOLHDL 3 12/18/2008 1034   VLDL 16.8  12/18/2008 1034   LDLCALC 59 12/08/2020 0855     History of Present Illness    73 year old female with the above past medical history including elevated coronary artery calcium  score, aortic atherosclerosis, palpitations, PACs, hypertension, hyperlipidemia, arthritis, fibromyalgia, and GERD.  Previously followed by Dr. Claudene.  She later established with Dr. Jeffrie.  Coronary calcium  score in 2021 was 177 (85th percentile).  Echocardiogram in 12/2019 showed EF 60 to 65%, normal LV function, no RWMA, G1 DD, normal RV systolic function, mild aortic valve sclerosis without evidence of aortic stenosis. Cardiac monitor in 12/2019 revealed predominantly sinus rhythm, rare PACs, no significant arrhythmia. She was last seen in the office on 01/12/2023 and reported left arm pain relieved with Tylenol , swelling in the left foot.  Swelling was noted to likely be dependent.  Her left arm pain was noted to likely be musculoskeletal.   She presents today for follow-up accompanied by her husband. Since her last visit she has been stable overall.  However, she reports that over the past month, she has felt poorly.  She reports increased palpitations, which she describes as a fluttering.  Symptoms occur every few days, last for seconds and resolve spontaneously.  She denies any associated symptoms.  She has noticed she wakes up more frequently during the night, often sweating.  She also report increased stress and anxiety.  She shares that she has been under a significant amount of stress since her mother died 2 years ago.  She dates I have not been right since.  She clenches her teeth at night, and has noticed intermittent jaw pain in the setting.  Additionally, she has had a persistent cough, intermittent nausea, she states her throat feels like it is going to close up. Her husband has observed shortness of breath, patient denies.  She has seen an allergist and is pending evaluation per ENT.  She also follows with GI.  She notes occasional dizziness when standing,  particularly when cooking in the kitchen.  She does not drink much water.  She denies chest pain.   Home Medications    Current Outpatient Medications  Medication Sig Dispense Refill   ADVAIR  HFA 115-21 MCG/ACT inhaler INHALE 2 PUFFS BY MOUTH 1 TO 2 TIMES PER DAY 12 g 5   albuterol  (VENTOLIN  HFA) 108 (90 Base) MCG/ACT inhaler INHALE 2 PUFFS INTO THE LUNGS EVERY 6 HOURS AS NEEDED FOR WHEEZING OR SHORTNESS OF BREATH 6.7 g 1   ALPRAZolam (XANAX) 0.5 MG tablet as needed.     Ascorbic Acid (VITAMIN C PO) Take 1 tablet by mouth daily.     Azelastine -Fluticasone  137-50 MCG/ACT SUSP SHAKE LIQUID AND USE ONE SPRAY IN EACH NOSTRIL ONE TO TWO TIMES DAILY. 23 g 5   CALCIUM  PO Take 1 tablet by mouth daily.     candesartan (ATACAND) 16 MG tablet Take 8 mg by mouth at bedtime.     cetirizine  (ZYRTEC ) 10 MG tablet Take 1 tablet (10 mg total) by mouth daily. One tab daily for allergies 30 tablet 1   Cholecalciferol (VITAMIN D ) 2000 units tablet Take 2,000 Units by mouth daily.     clotrimazole  (MYCELEX ) 10 MG troche CAN USE 1 TROCHE IN THE MOUTH ONCE DAILY AS NEEDED 30 Troche 5   Cyanocobalamin (B-12 COMPLIANCE INJECTION IJ) Inject as directed. Once monthly     diclofenac  (CATAFLAM ) 50 MG tablet Take 1 tablet (50 mg total) by mouth 2 (two) times daily. 180 tablet 4   dicyclomine  (BENTYL ) 10 MG capsule  Take 1 capsule (10 mg total) by mouth 3 (three) times daily as needed for spasms. 90 capsule 4   famotidine  (PEPCID ) 40 MG tablet Can take one tablet one to two times daily as directed. 180 tablet 1   hydrochlorothiazide (HYDRODIURIL) 12.5 MG tablet      hydrocortisone  (ANUSOL -HC) 25 MG suppository Place 1 suppository (25 mg total) rectally at bedtime. One per night for 2 weeks 30 suppository 6   ipratropium-albuterol  (DUONEB) 0.5-2.5 (3) MG/3ML SOLN Use one vial in the nebulizer every 4-6 hours if needed for cough or wheeze. 300 mL 1   Lifitegrast 5 % SOLN Apply 1 drop to eye 2 (two) times daily.      methocarbamol  (ROBAXIN ) 500 MG tablet TAKE 1 TABLET BY MOUTH DAILY AS NEEDED FOR MUSCLE SPASMS 30 tablet 2   montelukast  (SINGULAIR ) 10 MG tablet TAKE 1 TABLET(10 MG) BY MOUTH DAILY 90 tablet 1   rosuvastatin  (CRESTOR ) 20 MG tablet TAKE 1 TABLET(20 MG) BY MOUTH DAILY 30 tablet 0   VITAMIN A PO Take by mouth daily.     zolmitriptan  (ZOMIG -ZMT) 5 MG disintegrating tablet Take 1 tablet (5 mg total) by mouth as needed for migraine. 10 tablet 11   zolpidem  (AMBIEN ) 5 MG tablet TAKE 1 TABLET(5 MG) BY MOUTH AT BEDTIME AS NEEDED FOR SLEEP 30 tablet 0   Multiple Vitamin (MULTI-VITAMIN) tablet Take by mouth. (Patient not taking: Reported on 02/08/2024)     Na Sulfate-K Sulfate-Mg Sulfate concentrate (SUPREP) 17.5-3.13-1.6 GM/177ML SOLN Use as directed; may use generic; goodrx card if insurance will not cover generic (Patient not taking: Reported on 02/08/2024) 354 mL 0   No current facility-administered medications for this visit.     Review of Systems   She denies chest pain, dyspnea, pnd, orthopnea, n, v, syncope, edema, weight gain, or early satiety. All other systems reviewed and are otherwise negative except as noted above.   Physical Exam    VS:  BP 120/80 (BP Location: Left Arm, Patient Position: Sitting, Cuff Size: Normal)   Pulse 83   Ht 5' 4 (1.626 m)   Wt 149 lb (67.6 kg)   LMP 07/02/1994   BMI 25.58 kg/m   GEN: Well nourished, well developed, in no acute distress. HEENT: normal. Neck: Supple, no JVD, carotid bruits, or masses. Cardiac: RRR, no murmurs, rubs, or gallops. No clubbing, cyanosis, edema.  Radials/DP/PT 2+ and equal bilaterally.  Respiratory:  Respirations regular and unlabored, clear to auscultation bilaterally. GI: Soft, nontender, nondistended, BS + x 4. MS: no deformity or atrophy. Skin: warm and dry, no rash. Neuro:  Strength and sensation are intact. Psych: Normal affect.  Accessory Clinical Findings    ECG personally reviewed by me today - EKG  Interpretation Date/Time:  Wednesday February 08 2024 11:43:55 EDT Ventricular Rate:  83 PR Interval:  152 QRS Duration:  78 QT Interval:  390 QTC Calculation: 458 R Axis:   61  Text Interpretation: Normal sinus rhythm Normal ECG When compared with ECG of 12-Jan-2023 11:26, No significant change was found Confirmed by Daneen Perkins ([id number]) on 02/08/2024 11:45:20 AM  - no acute changes.   Lab Results  Component Value Date   WBC 4.1 12/08/2020   HGB 12.5 12/08/2020   HCT 37.2 12/08/2020   MCV 88 12/08/2020   PLT 262 12/08/2020   Lab Results  Component Value Date   CREATININE 0.92 12/08/2020   BUN 15 12/08/2020   NA 144 12/08/2020   K 4.1 12/08/2020   CL  107 (H) 12/08/2020   CO2 25 12/08/2020   Lab Results  Component Value Date   ALT 13 12/08/2020   AST 20 12/08/2020   ALKPHOS 77 12/08/2020   BILITOT <0.2 12/08/2020   Lab Results  Component Value Date   CHOL 130 12/08/2020   HDL 57 12/08/2020   LDLCALC 59 12/08/2020   TRIG 71 12/08/2020   CHOLHDL 2.3 12/08/2020    No results found for: HGBA1C  Assessment & Plan    1.  Palpitations/PACs/dizziness: Cardiac monitor in 12/2019 revealed predominantly sinus rhythm, rare PACs, no significant arrhythmia. Echocardiogram in 12/2019 showed EF 60 to 65%, normal LV function, no RWMA, G1 DD, normal RV systolic function, mild aortic valve sclerosis without evidence of aortic stenosis. She has felt poorly for the past month.  She reports increased palpitations, which she describes as a fluttering.  Symptoms occur every few days, last for seconds and resolve spontaneously.  She denies any associated symptoms.  She has noticed she wakes up more frequently during the night, often sweating.  She also report increased stress and anxiety.  She notes occasional dizziness when standing, particularly when going in the kitchen.  Through shared decision making, will check 14-day monitor, she does have a tape sensitivity, she will therefore require a  [employer] monitor.  Will check CBC, BMET, TSH, magnesium .  Will update echocardiogram.  Encourage adequate hydration, reviewed ED precautions.   2. Elevated coronary artery calcium  score/aortic atherosclerosis: Coronary calcium  score in 2021 was 177 (85th percentile). Echocardiogram in 12/2019 showed EF 60 to 65%, normal LV function, no RWMA, G1 DD, normal RV systolic function, mild aortic valve sclerosis without evidence of aortic stenosis.  She reports a myriad of symptoms/concerns today.  Including persistent cough, intermittent nausea, feeling as though her throat is going to close up, and intermittent jaw pain.  She does share that she clenches her teeth at night and has been under significant mount of stress and anxiety.  She is following with GI, an allergist, and ENT.  Her husband has observed shortness of breath, however, patient denies. She denies chest pain.  She is pending cardiac monitor, echocardiogram as above. Continue to monitor symptoms.  Should she have progressive symptoms concerning for angina, consider need for ischemic evaluation, will defer for now.  Continue Crestor .  3. Hypertension: BP well controlled. Continue current antihypertensive regimen.   4. Hyperlipidemia: No recent LDL on file. Will request most recent labs from PCP.  Continue Crestor .  5.  Chronic cough: She does have a history of GERD.  She is being evaluated by GI, an allergist, and ENT. Euvolemic and well compensated on exam.  Repeat echo pending as above.  6. Disposition: Follow-up in 2 to 3 months, sooner if needed.       Damien JAYSON Braver, NP 02/08/2024, 3:09 PM

## 2024-02-08 NOTE — Patient Instructions (Signed)
 Medication Instructions:  Continue same medications *If you need a refill on your cardiac medications before your next appointment, please call your pharmacy*  Lab Work: Cbc,bmet,tsh,magnesium  today  Testing/Procedures: Echo  first available  14 day Heart Monitor will be mailed to your home with instructions   Follow-Up: At [facility], you and your health needs are our priority.  As part of our continuing mission to provide you with exceptional heart care, our providers are all part of one team.  This team includes your primary Cardiologist (physician) and Advanced Practice Providers or APPs (Physician Assistants and Nurse Practitioners) who all work together to provide you with the care you need, when you need it.  Your next appointment:  2 to 3 months    Provider:  Dr.Skains   We recommend signing up for the patient portal called MyChart.  Sign up information is provided on this After Visit Summary.  MyChart is used to connect with patients for Virtual Visits (Telemedicine).  Patients are able to view lab/test results, encounter notes, upcoming appointments, etc.  Non-urgent messages can be sent to your provider as well.   To learn more about what you can do with MyChart, go to [email or web address].

## 2024-02-09 ENCOUNTER — Ambulatory Visit: Payer: Self-pay | Admitting: Nurse Practitioner

## 2024-02-09 LAB — CBC WITH DIFFERENTIAL/PLATELET
Basophils Absolute: 0 x10E3/uL (ref 0.0–0.2)
Basos: 1 %
EOS (ABSOLUTE): 0.1 x10E3/uL (ref 0.0–0.4)
Eos: 2 %
Hematocrit: 40.2 % (ref 34.0–46.6)
Hemoglobin: 13.1 g/dL (ref 11.1–15.9)
Immature Grans (Abs): 0 x10E3/uL (ref 0.0–0.1)
Immature Granulocytes: 0 %
Lymphocytes Absolute: 1.5 x10E3/uL (ref 0.7–3.1)
Lymphs: 31 %
MCH: 30.3 pg (ref 26.6–33.0)
MCHC: 32.6 g/dL (ref 31.5–35.7)
MCV: 93 fL (ref 79–97)
Monocytes Absolute: 0.4 x10E3/uL (ref 0.1–0.9)
Monocytes: 9 %
Neutrophils Absolute: 2.8 x10E3/uL (ref 1.4–7.0)
Neutrophils: 57 %
Platelets: 291 x10E3/uL (ref 150–450)
RBC: 4.33 x10E6/uL (ref 3.77–5.28)
RDW: 12.9 % (ref 11.7–15.4)
WBC: 4.8 x10E3/uL (ref 3.4–10.8)

## 2024-02-09 LAB — BASIC METABOLIC PANEL WITH GFR
BUN/Creatinine Ratio: 17 (ref 12–28)
BUN: 17 mg/dL (ref 8–27)
CO2: 27 mmol/L (ref 20–29)
Calcium: 9.8 mg/dL (ref 8.7–10.3)
Chloride: 104 mmol/L (ref 96–106)
Creatinine, Ser: 0.98 mg/dL (ref 0.57–1.00)
Glucose: 82 mg/dL (ref 70–99)
Potassium: 4.5 mmol/L (ref 3.5–5.2)
Sodium: 144 mmol/L (ref 134–144)
eGFR: 61 mL/min/1.73 (ref >59)

## 2024-02-09 LAB — MAGNESIUM: Magnesium: 2.3 mg/dL (ref 1.6–2.3)

## 2024-02-09 LAB — TSH: TSH: 3.12 u[IU]/mL (ref 0.450–4.500)

## 2024-02-13 ENCOUNTER — Other Ambulatory Visit: Payer: Self-pay | Admitting: Rheumatology

## 2024-02-13 ENCOUNTER — Other Ambulatory Visit: Payer: Self-pay | Admitting: Cardiology

## 2024-02-13 NOTE — Telephone Encounter (Signed)
 Last Fill: 01/13/2024  Next Visit: 02/21/2024  Last Visit: 08/10/2023  DX: Other insomnia   Current Dose per office note on 08/10/2023: Ambien  5 mg 1 tablet by mouth at bedtime for insomnia.   Okay to refill ambien ?

## 2024-02-13 NOTE — Telephone Encounter (Signed)
 Last Fill: 01/13/2024  Next Visit: 02/21/2024  Last Visit: 08/10/2023  Dx: Fibromyalgia   Current Dose per office note on 08/10/2023: Ambien  5 mg 1 tablet by mouth at bedtime   Okay to refill Ambien ?

## 2024-02-21 ENCOUNTER — Ambulatory Visit: Attending: Rheumatology | Admitting: Rheumatology

## 2024-02-21 ENCOUNTER — Encounter: Payer: Self-pay | Admitting: Rheumatology

## 2024-02-21 VITALS — BP 131/76 | HR 80 | Temp 97.9°F | Resp 14 | Ht 64.0 in | Wt 150.0 lb

## 2024-02-21 DIAGNOSIS — G4709 Other insomnia: Secondary | ICD-10-CM | POA: Diagnosis not present

## 2024-02-21 DIAGNOSIS — M25511 Pain in right shoulder: Secondary | ICD-10-CM

## 2024-02-21 DIAGNOSIS — M797 Fibromyalgia: Secondary | ICD-10-CM

## 2024-02-21 DIAGNOSIS — M19041 Primary osteoarthritis, right hand: Secondary | ICD-10-CM

## 2024-02-21 DIAGNOSIS — R5383 Other fatigue: Secondary | ICD-10-CM

## 2024-02-21 DIAGNOSIS — Z8679 Personal history of other diseases of the circulatory system: Secondary | ICD-10-CM

## 2024-02-21 DIAGNOSIS — M7062 Trochanteric bursitis, left hip: Secondary | ICD-10-CM

## 2024-02-21 DIAGNOSIS — M25512 Pain in left shoulder: Secondary | ICD-10-CM

## 2024-02-21 DIAGNOSIS — G8929 Other chronic pain: Secondary | ICD-10-CM

## 2024-02-21 DIAGNOSIS — Z8639 Personal history of other endocrine, nutritional and metabolic disease: Secondary | ICD-10-CM

## 2024-02-21 DIAGNOSIS — M7061 Trochanteric bursitis, right hip: Secondary | ICD-10-CM

## 2024-02-21 DIAGNOSIS — M5030 Other cervical disc degeneration, unspecified cervical region: Secondary | ICD-10-CM

## 2024-02-21 DIAGNOSIS — Z8719 Personal history of other diseases of the digestive system: Secondary | ICD-10-CM

## 2024-02-21 DIAGNOSIS — M170 Bilateral primary osteoarthritis of knee: Secondary | ICD-10-CM

## 2024-02-21 DIAGNOSIS — Z8709 Personal history of other diseases of the respiratory system: Secondary | ICD-10-CM

## 2024-02-21 DIAGNOSIS — Z860100 Personal history of colon polyps, unspecified: Secondary | ICD-10-CM

## 2024-02-21 DIAGNOSIS — E538 Deficiency of other specified B group vitamins: Secondary | ICD-10-CM

## 2024-02-21 DIAGNOSIS — Z8669 Personal history of other diseases of the nervous system and sense organs: Secondary | ICD-10-CM

## 2024-02-21 DIAGNOSIS — M25532 Pain in left wrist: Secondary | ICD-10-CM

## 2024-02-21 DIAGNOSIS — M47816 Spondylosis without myelopathy or radiculopathy, lumbar region: Secondary | ICD-10-CM

## 2024-02-21 DIAGNOSIS — M19042 Primary osteoarthritis, left hand: Secondary | ICD-10-CM

## 2024-02-21 NOTE — Patient Instructions (Signed)
 Hand Exercises Hand exercises can be helpful for almost anyone. They can strengthen your hands and improve flexibility and movement. The exercises can also increase blood flow to the hands. These results can make your work and daily tasks easier for you. Hand exercises can be especially helpful for people who have joint pain from arthritis or nerve damage from using their hands over and over. These exercises can also help people who injure a hand. Exercises Most of these hand exercises are gentle stretching and motion exercises. It is usually safe to do them often throughout the day. Warming up your hands before exercise may help reduce stiffness. You can do this with gentle massage or by placing your hands in warm water for 10-15 minutes. It is normal to feel some stretching, pulling, tightness, or mild discomfort when you begin new exercises. In time, this will improve. Remember to always be careful and stop right away if you feel sudden, very bad pain or your pain gets worse. You want to get better and be safe. Ask your health care provider which exercises are safe for you. Do exercises exactly as told by your provider and adjust them as told. Do not begin these exercises until told by your provider. Knuckle bend or "claw" fist  Stand or sit with your arm, hand, and all five fingers pointed straight up. Make sure to keep your wrist straight. Gently bend your fingers down toward your palm until the tips of your fingers are touching your palm. Keep your big knuckle straight and only bend the small knuckles in your fingers. Hold this position for 10 seconds. Straighten your fingers back to your starting position. Repeat this exercise 5-10 times with each hand. Full finger fist  Stand or sit with your arm, hand, and all five fingers pointed straight up. Make sure to keep your wrist straight. Gently bend your fingers into your palm until the tips of your fingers are touching the middle of your  palm. Hold this position for 10 seconds. Extend your fingers back to your starting position, stretching every joint fully. Repeat this exercise 5-10 times with each hand. Straight fist  Stand or sit with your arm, hand, and all five fingers pointed straight up. Make sure to keep your wrist straight. Gently bend your fingers at the big knuckle, where your fingers meet your hand, and at the middle knuckle. Keep the knuckle at the tips of your fingers straight and try to touch the bottom of your palm. Hold this position for 10 seconds. Extend your fingers back to your starting position, stretching every joint fully. Repeat this exercise 5-10 times with each hand. Tabletop  Stand or sit with your arm, hand, and all five fingers pointed straight up. Make sure to keep your wrist straight. Gently bend your fingers at the big knuckle, where your fingers meet your hand, as far down as you can. Keep the small knuckles in your fingers straight. Think of forming a tabletop with your fingers. Hold this position for 10 seconds. Extend your fingers back to your starting position, stretching every joint fully. Repeat this exercise 5-10 times with each hand. Finger spread  Place your hand flat on a table with your palm facing down. Make sure your wrist stays straight. Spread your fingers and thumb apart from each other as far as you can until you feel a gentle stretch. Hold this position for 10 seconds. Bring your fingers and thumb tight together again. Hold this position for 10 seconds. Repeat  this exercise 5-10 times with each hand. Making circles  Stand or sit with your arm, hand, and all five fingers pointed straight up. Make sure to keep your wrist straight. Make a circle by touching the tip of your thumb to the tip of your index finger. Hold for 10 seconds. Then open your hand wide. Repeat this motion with your thumb and each of your fingers. Repeat this exercise 5-10 times with each hand. Thumb  motion  Sit with your forearm resting on a table and your wrist straight. Your thumb should be facing up toward the ceiling. Keep your fingers relaxed as you move your thumb. Lift your thumb up as high as you can toward the ceiling. Hold for 10 seconds. Bend your thumb across your palm as far as you can, reaching the tip of your thumb for the small finger (pinkie) side of your palm. Hold for 10 seconds. Repeat this exercise 5-10 times with each hand. Grip strengthening  Hold a stress ball or other soft ball in the middle of your hand. Slowly increase the pressure, squeezing the ball as much as you can without causing pain. Think of bringing the tips of your fingers into the middle of your palm. All of your finger joints should bend when doing this exercise. Hold your squeeze for 10 seconds, then relax. Repeat this exercise 5-10 times with each hand. Contact a health care provider if: Your hand pain or discomfort gets much worse when you do an exercise. Your hand pain or discomfort does not improve within 2 hours after you exercise. If you have either of these problems, stop doing these exercises right away. Do not do them again unless your provider says that you can. Get help right away if: You develop sudden, severe hand pain or swelling. If this happens, stop doing these exercises right away. Do not do them again unless your provider says that you can. This information is not intended to replace advice given to you by your health care provider. Make sure you discuss any questions you have with your health care provider. Document Revised: 05/04/2022 Document Reviewed: 05/04/2022 [employer] Patient Education  2024 [employer] Low Back Sprain or Strain Rehab Ask your health care provider which exercises are safe for you. Do exercises exactly as told by your health care provider and adjust them as directed. It is normal to feel mild stretching, pulling, tightness, or discomfort as you do these  exercises. Stop right away if you feel sudden pain or your pain gets worse. Do not begin these exercises until told by your health care provider. Stretching and range-of-motion exercises These exercises warm up your muscles and joints and improve the movement and flexibility of your back. These exercises also help to relieve pain, numbness, and tingling. Lumbar rotation  Lie on your back on a firm bed or the floor with your knees bent. Straighten your arms out to your sides so each arm forms a 90-degree angle (right angle) with a side of your body. Slowly move (rotate) both of your knees to one side of your body until you feel a stretch in your lower back (lumbar). Try not to let your shoulders lift off the floor. Hold this position for __________ seconds. Tense your abdominal muscles and slowly move your knees back to the starting position. Repeat this exercise on the other side of your body. Repeat __________ times. Complete this exercise __________ times a day. Single knee to chest  Lie on your back on a  firm bed or the floor with both legs straight. Bend one of your knees. Use your hands to move your knee up toward your chest until you feel a gentle stretch in your lower back and buttock. Hold your leg in this position by holding on to the front of your knee. Keep your other leg as straight as possible. Hold this position for __________ seconds. Slowly return to the starting position. Repeat with your other leg. Repeat __________ times. Complete this exercise __________ times a day. Prone extension on elbows  Lie on your abdomen on a firm bed or the floor (prone position). Prop yourself up on your elbows. Use your arms to help lift your chest up until you feel a gentle stretch in your abdomen and your lower back. This will place some of your body weight on your elbows. If this is uncomfortable, try stacking pillows under your chest. Your hips should stay down, against the surface that  you are lying on. Keep your hip and back muscles relaxed. Hold this position for __________ seconds. Slowly relax your upper body and return to the starting position. Repeat __________ times. Complete this exercise __________ times a day. Strengthening exercises These exercises build strength and endurance in your back. Endurance is the ability to use your muscles for a long time, even after they get tired. Pelvic tilt This exercise strengthens the muscles that lie deep in the abdomen. Lie on your back on a firm bed or the floor with your legs extended. Bend your knees so they are pointing toward the ceiling and your feet are flat on the floor. Tighten your lower abdominal muscles to press your lower back against the floor. This motion will tilt your pelvis so your tailbone points up toward the ceiling instead of pointing to your feet or the floor. To help with this exercise, you may place a small towel under your lower back and try to push your back into the towel. Hold this position for __________ seconds. Let your muscles relax completely before you repeat this exercise. Repeat __________ times. Complete this exercise __________ times a day. Alternating arm and leg raises  Get on your hands and knees on a firm surface. If you are on a hard floor, you may want to use padding, such as an exercise mat, to cushion your knees. Line up your arms and legs. Your hands should be directly below your shoulders, and your knees should be directly below your hips. Lift your left leg behind you. At the same time, raise your right arm and straighten it in front of you. Do not lift your leg higher than your hip. Do not lift your arm higher than your shoulder. Keep your abdominal and back muscles tight. Keep your hips facing the ground. Do not arch your back. Keep your balance carefully, and do not hold your breath. Hold this position for __________ seconds. Slowly return to the starting  position. Repeat with your right leg and your left arm. Repeat __________ times. Complete this exercise __________ times a day. Abdominal set with straight leg raise  Lie on your back on a firm bed or the floor. Bend one of your knees and keep your other leg straight. Tense your abdominal muscles and lift your straight leg up, 4-6 inches (10-15 cm) off the ground. Keep your abdominal muscles tight and hold this position for __________ seconds. Do not hold your breath. Do not arch your back. Keep it flat against the ground. Keep your abdominal muscles  tense as you slowly lower your leg back to the starting position. Repeat with your other leg. Repeat __________ times. Complete this exercise __________ times a day. Single leg lower with bent knees Lie on your back on a firm bed or the floor. Tense your abdominal muscles and lift your feet off the floor, one foot at a time, so your knees and hips are bent in 90-degree angles (right angles). Your knees should be over your hips and your lower legs should be parallel to the floor. Keeping your abdominal muscles tense and your knee bent, slowly lower one of your legs so your toe touches the ground. Lift your leg back up to return to the starting position. Do not hold your breath. Do not let your back arch. Keep your back flat against the ground. Repeat with your other leg. Repeat __________ times. Complete this exercise __________ times a day. Posture and body mechanics Good posture and healthy body mechanics can help to relieve stress in your body's tissues and joints. Body mechanics refers to the movements and positions of your body while you do your daily activities. Posture is part of body mechanics. Good posture means: Your spine is in its natural S-curve position (neutral). Your shoulders are pulled back slightly. Your head is not tipped forward (neutral). Follow these guidelines to improve your posture and body mechanics in your everyday  activities. Standing  When standing, keep your spine neutral and your feet about hip-width apart. Keep a slight bend in your knees. Your ears, shoulders, and hips should line up. When you do a task in which you stand in one place for a long time, place one foot up on a stable object that is 2-4 inches (5-10 cm) high, such as a footstool. This helps keep your spine neutral. Sitting  When sitting, keep your spine neutral and keep your feet flat on the floor. Use a footrest, if necessary, and keep your thighs parallel to the floor. Avoid rounding your shoulders, and avoid tilting your head forward. When working at a desk or a computer, keep your desk at a height where your hands are slightly lower than your elbows. Slide your chair under your desk so you are close enough to maintain good posture. When working at a computer, place your monitor at a height where you are looking straight ahead and you do not have to tilt your head forward or downward to look at the screen. Resting When lying down and resting, avoid positions that are most painful for you. If you have pain with activities such as sitting, bending, stooping, or squatting, lie in a position in which your body does not bend very much. For example, avoid curling up on your side with your arms and knees near your chest (fetal position). If you have pain with activities such as standing for a long time or reaching with your arms, lie with your spine in a neutral position and bend your knees slightly. Try the following positions: Lying on your side with a pillow between your knees. Lying on your back with a pillow under your knees. Lifting  When lifting objects, keep your feet at least shoulder-width apart and tighten your abdominal muscles. Bend your knees and hips and keep your spine neutral. It is important to lift using the strength of your legs, not your back. Do not lock your knees straight out. Always ask for help to lift heavy or  awkward objects. This information is not intended to replace advice given  to you by your health care provider. Make sure you discuss any questions you have with your health care provider. Document Revised: 08/23/2022 Document Reviewed: 07/07/2020 [employer] Patient Education  2024 [employer].

## 2024-03-01 ENCOUNTER — Institutional Professional Consult (permissible substitution) (INDEPENDENT_AMBULATORY_CARE_PROVIDER_SITE_OTHER): Admitting: Otolaryngology

## 2024-03-01 ENCOUNTER — Ambulatory Visit (INDEPENDENT_AMBULATORY_CARE_PROVIDER_SITE_OTHER)

## 2024-03-01 ENCOUNTER — Encounter (INDEPENDENT_AMBULATORY_CARE_PROVIDER_SITE_OTHER): Payer: Self-pay

## 2024-03-01 VITALS — BP 126/75 | HR 86 | Temp 97.9°F | Wt 150.0 lb

## 2024-03-01 DIAGNOSIS — K219 Gastro-esophageal reflux disease without esophagitis: Secondary | ICD-10-CM

## 2024-03-01 DIAGNOSIS — H9193 Unspecified hearing loss, bilateral: Secondary | ICD-10-CM

## 2024-03-01 DIAGNOSIS — R490 Dysphonia: Secondary | ICD-10-CM

## 2024-03-01 DIAGNOSIS — R0982 Postnasal drip: Secondary | ICD-10-CM | POA: Diagnosis not present

## 2024-03-01 DIAGNOSIS — L739 Follicular disorder, unspecified: Secondary | ICD-10-CM

## 2024-03-01 DIAGNOSIS — R053 Chronic cough: Secondary | ICD-10-CM | POA: Diagnosis not present

## 2024-03-01 DIAGNOSIS — J300 Vasomotor rhinitis: Secondary | ICD-10-CM

## 2024-03-01 MED ORDER — IPRATROPIUM BROMIDE 0.03 % NA SOLN: 2.0000 | Freq: Every evening | NASAL | 2 refills | Status: AC

## 2024-03-01 MED ORDER — MUPIROCIN 2 % EX OINT
1.0000 | TOPICAL_OINTMENT | Freq: Two times a day (BID) | CUTANEOUS | 0 refills | Status: AC
Start: 2024-03-01 — End: ?

## 2024-03-01 NOTE — Progress Notes (Signed)
 HPI:   Discussed the use of AI scribe software for clinical note transcription with the patient, who gave verbal consent to proceed.  History of Present Illness Christina Deleon is a 73 year old female with asthma who presents with chronic cough and voice changes.  She has experienced a persistent cough for about six months, with worsening symptoms over the last three months. The cough is particularly troublesome at night when she lies down, prompting her to get up and make tea to alleviate it. She describes a sensation of a lump in her throat but denies regurgitation or burning chest pain.  She has a history of asthma and uses both a daily inhaler and a rescue inhaler, the latter about two to three times per month. There have been no recent changes in her asthma medications.  She takes famotidine  and pantoprazole  for reflux, both at a dose of 40 mg daily. Her gastroenterologist had previously discontinued pantoprazole , but her primary care doctor restarted it about a month ago. She has not noticed any improvement in her symptoms since restarting the medication.  She experiences post-nasal drainage, particularly when lying down, and uses Dymista  nasal spray, though not daily. She also takes Claritin and another unspecified allergy  medication daily for her dust allergy .  No smoking or recreational drug use. She drinks about one and a half to two bottles of water daily. She has not had a hearing test in approximately thirty years and feels her hearing has declined.  She experiences occasional bumps inside her nose that become sore, and reports having sinus issues all the time.    PMH/Meds/All/SocHx/FamHx/ROS: Past Medical History:  Diagnosis Date   Adenomatous colon polyp 10/1997, and 03/2009   Allergic rhinitis    Allergy     Aortic atherosclerosis    Arthritis    osteo   Asthma    B12 deficiency    Chronic gastritis    Coronary artery calcification seen on CT scan    Fibromyalgia    GERD  (gastroesophageal reflux disease)    Hemorrhoids    Hyperlipidemia    Hypertension    Menorrhagia    partial hysterectomy   Migraines    Osteoarthritis    PONV (postoperative nausea and vomiting)    Premature atrial contractions    Sleep disturbance    STD (sexually transmitted disease) 01/22/11 culture proven   HSV type I labia   Past Surgical History:  Procedure Laterality Date   CATARACT EXTRACTION Bilateral 10/16 & 11/16   EXCISION METACARPAL MASS Right 11/16/2016   Procedure: RIGHT LITTLE FINGER CYST EXCISION;  Surgeon: Sebastian Lenis, MD;  Location: [facility];  Service: Orthopedics;  Laterality: Right;   TONSILLECTOMY  age 42   tooth extract     with bone graft   Turbinate sinus surgery  2003   UPPER GI ENDOSCOPY  07/2022   VAGINAL HYSTERECTOMY  1998   secondary to prolapse, ovaries remain   No family history of bleeding disorders, wound healing problems or difficulty with anesthesia.  Social Connections: Not on file    Current Outpatient Medications:    ADVAIR  HFA 115-21 MCG/ACT inhaler, INHALE 2 PUFFS BY MOUTH 1 TO 2 TIMES PER DAY, Disp: 12 g, Rfl: 5   albuterol  (VENTOLIN  HFA) 108 (90 Base) MCG/ACT inhaler, INHALE 2 PUFFS INTO THE LUNGS EVERY 6 HOURS AS NEEDED FOR WHEEZING OR SHORTNESS OF BREATH, Disp: 6.7 g, Rfl: 1   ALPRAZolam (XANAX) 0.5 MG tablet, as needed., Disp: , Rfl:  Ascorbic Acid (VITAMIN C PO), Take 1 tablet by mouth daily., Disp: , Rfl:    Azelastine -Fluticasone  137-50 MCG/ACT SUSP, SHAKE LIQUID AND USE ONE SPRAY IN EACH NOSTRIL ONE TO TWO TIMES DAILY., Disp: 23 g, Rfl: 5   CALCIUM  PO, Take 1 tablet by mouth daily., Disp: , Rfl:    candesartan (ATACAND) 16 MG tablet, Take 8 mg by mouth at bedtime. (Patient taking differently: Take 16 mg by mouth at bedtime.), Disp: , Rfl:    cetirizine  (ZYRTEC ) 10 MG tablet, Take 1 tablet (10 mg total) by mouth daily. One tab daily for allergies, Disp: 30 tablet, Rfl: 1   Cholecalciferol (VITAMIN D ) 2000  units tablet, Take 2,000 Units by mouth daily., Disp: , Rfl:    clotrimazole  (MYCELEX ) 10 MG troche, CAN USE 1 TROCHE IN THE MOUTH ONCE DAILY AS NEEDED, Disp: 30 Troche, Rfl: 5   Cyanocobalamin (B-12 COMPLIANCE INJECTION IJ), Inject as directed. Once monthly, Disp: , Rfl:    diclofenac  (CATAFLAM ) 50 MG tablet, Take 1 tablet (50 mg total) by mouth 2 (two) times daily., Disp: 180 tablet, Rfl: 4   dicyclomine  (BENTYL ) 10 MG capsule, Take 1 capsule (10 mg total) by mouth 3 (three) times daily as needed for spasms., Disp: 90 capsule, Rfl: 4   famotidine  (PEPCID ) 40 MG tablet, Can take one tablet one to two times daily as directed., Disp: 180 tablet, Rfl: 1   hydrochlorothiazide (HYDRODIURIL) 12.5 MG tablet, , Disp: , Rfl:    hydrocortisone  (ANUSOL -HC) 25 MG suppository, Place 1 suppository (25 mg total) rectally at bedtime. One per night for 2 weeks, Disp: 30 suppository, Rfl: 6   ipratropium-albuterol  (DUONEB) 0.5-2.5 (3) MG/3ML SOLN, Use one vial in the nebulizer every 4-6 hours if needed for cough or wheeze., Disp: 300 mL, Rfl: 1   Lifitegrast 5 % SOLN, Apply 1 drop to eye 2 (two) times daily., Disp: , Rfl:    methocarbamol  (ROBAXIN ) 500 MG tablet, TAKE 1 TABLET BY MOUTH DAILY AS NEEDED FOR MUSCLE SPASMS, Disp: 30 tablet, Rfl: 2   montelukast  (SINGULAIR ) 10 MG tablet, TAKE 1 TABLET(10 MG) BY MOUTH DAILY, Disp: 90 tablet, Rfl: 1   Multiple Vitamin (MULTI-VITAMIN) tablet, Take by mouth., Disp: , Rfl:    rosuvastatin  (CRESTOR ) 20 MG tablet, TAKE 1 TABLET(20 MG) BY MOUTH DAILY, Disp: 90 tablet, Rfl: 3   VITAMIN A PO, Take by mouth daily., Disp: , Rfl:    zolmitriptan  (ZOMIG -ZMT) 5 MG disintegrating tablet, Take 1 tablet (5 mg total) by mouth as needed for migraine., Disp: 10 tablet, Rfl: 11   zolpidem  (AMBIEN ) 5 MG tablet, TAKE 1 TABLET(5 MG) BY MOUTH AT BEDTIME AS NEEDED FOR SLEEP, Disp: 30 tablet, Rfl: 0 A complete ROS was performed with pertinent positives/negatives noted in the HPI. The remainder of  the ROS are negative.   Physical Exam:  Wt 150 lb (68 kg)   LMP 07/02/1994   BMI 25.75 kg/m  General: Well developed, well nourished. No acute distress. Voice hoarse Head/Face: Normocephalic. No sinus tenderness. Facial nerve intact and equal bilaterally. No facial lacerations. Eyes: PERRL, no scleral icterus or conjunctival hemorrhage. EOMI. Ears: No gross deformity. Normal external canal. Tympanic membrane in tact bilaterally Hearing: Normal speech reception.  Nose: No gross deformity or lesions. No purulent discharge. No turbinate hypertrophy. Mouth/Oropharynx: Lips without any lesions. Dentition good. No mucosal lesions within the oropharynx. No tonsillar enlargement, exudate, or lesions. Pharyngeal walls symmetrical. Uvula midline. Tongue midline without lesions. Larynx: See TFL if applicable Nasopharynx: See  TFL if applicable Neck: Trachea midline. No masses. No thyromegaly or nodules palpated. No crepitus. Lymphatic: No lymphadenopathy in the neck. Respiratory: No stridor or distress. Room air. Cardiovascular: Regular rate and rhythm. Extremities: No edema or cyanosis. Warm and well-perfused. Skin: No scars or lesions on face or neck. Neurologic: CN II-XII grossly intact. Moving all extremities without gross abnormality. Other:  Independent Review of Additional Tests or Records: None Procedures: Flexible Fiberoptic Laryngoscopy Procedure Note  Date of procedure 03/01/2024 Pre-Op Diagnosis: hoarseness    Post Op Diagnosis: same Procedure: Flexible Fiberoptic Laryngoscopy CPT 31575 - Mod 25 Surgeon: Adah Malkin, D.O. Anesthesia: 4% Lidocaine  with Afrin Findings:  - bilateral vocal cord motion - no masses, lesions, or ulcerations - clear post-nasal drainage Procedure Detail: After verbal consent was obtained from the patient, the patient was brought in an upright position, a fiberoptic nasal laryngoscope was then passed into the patient right nasal passage and left nasal  passage, the right appeared to be more patent. It was passed along the floor of the nasal cavity to the nasopharynx. Torus tubarius was patent and the Fossa of Rosenmller was identified. The scope was then flexed caudally and advanced slowly through the nasopharynx, passed through the oropharynx, and down into the hypopharynx. The patient's oro- and nasopharynx were unremarkable with no signs of any gross lesions, edema, masses, or bleeding.  The base of tongue was visualized and no mass, ulceration or lesion was appreciated. The epiglottis did not demonstrate any mass, ulceration or lesion. The vallecula was also assessed with no mass, ulceration or lesion. The patient had good glottic closure upon phonation and no signs of aspiration or pooling of secretions. The patient was asked to inspire and expire with the true vocal folds vibrating normally and without evidence of vocal fold dysfunction. The true and false vocal cords, interarytenoid, AE folds, and arytenoids did not demonstrate any significant edema or erythema. The patient was then asked to valsalva, and the pyriform sinuses were assessed which were unremarkable. The airway was patent and there was no evidence of compromise. The scope was then slowly withdrawn from the patient. The patient tolerated the procedure well and there were no complications.  Disposition: Stable    Impression & Plans:  Assessment and Plan Assessment & Plan Chronic cough and postnasal drainage Chronic cough and postnasal drainage likely due to postnasal drip from allergic and vasomotor rhinitis, worsening at night when supine. - Prescribe ipratropium bromide nasal spray for nighttime use - Re-evaluate in four to six weeks to assess treatment effectiveness.  Allergic and vasomotor rhinitis Allergic and vasomotor rhinitis contributing to postnasal drainage and chronic cough. - Continue Dymista  nasal spray and oral allergy  medications. - Consider ipratropium  bromide nasal spray for additional symptom control.  Laryngopharyngeal reflux (LPR) Suspected laryngopharyngeal reflux due to nighttime cough and possible silent reflux affecting vocal cords. Current treatment includes pantoprazole  and famotidine  with no significant improvement. - Advise avoiding eating before bedtime to reduce reflux symptoms. - Continue current reflux medications.  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 - Prescribe mupirocin ointment for use as needed during flare-ups.  Hearing loss, unspecified Reports difficulty hearing with no hearing test conducted in approximately 30 years. - Order hearing test.   Follow-up in 4-6 weeks  Adah Malkin, DO [facility]

## 2024-03-05 ENCOUNTER — Encounter: Payer: Self-pay | Admitting: Neurology

## 2024-03-05 ENCOUNTER — Ambulatory Visit (INDEPENDENT_AMBULATORY_CARE_PROVIDER_SITE_OTHER): Admitting: Audiology

## 2024-03-05 DIAGNOSIS — H903 Sensorineural hearing loss, bilateral: Secondary | ICD-10-CM

## 2024-03-05 NOTE — Progress Notes (Signed)
  [address] [place], [place] [address] [phone]  Audiological Evaluation    Name: Christina Deleon     DOB:   1951/03/21      MRN:   [id number]                                                                                     Service Date: 03/05/2024     Accompanied by: unaccompanied   Patient comes today after Dr. Mila, ENT sent a referral for a hearing evaluation due to concerns with hearing loss.   Symptoms Yes Details  Hearing loss  [x]  per family  Tinnitus  []    Ear pain/ infections/pressure  [x]  ear infections only when young  Balance problems  []    Noise exposure history  []    Previous ear surgeries  []    Family history of hearing loss  [x]  Mother with age  Amplification  []    Other  []      Otoscopy: Right ear: Clear external ear canal and notable landmarks visualized on the tympanic membrane. Left ear:  Clear external ear canal and notable landmarks visualized on the tympanic membrane.  Tympanometry: Right ear: Normal external ear canal volume with normal middle ear pressure and tympanic membrane compliance (Type A). Findings are suggestive of normal middle ear function. Left ear: Normal external ear canal volume with normal middle ear pressure and high tympanic membrane compliance (Type Ad).  Hearing Evaluation The hearing test results were completed under headphones and re-checked with inserts and results are deemed to be of good reliability. Test technique:  conventional    Pure tone Audiometry: Right ear- Normal hearing from [phone] Hz, then mild presumably sensorineural hearing loss at 8000 Hz. Left ear-  Normal hearing from [phone] Hz, then mild to moderate sensorineural hearing loss from 1500 Hz - 8000 Hz.  Speech Audiometry: Right ear- Speech Reception Threshold (SRT) was obtained at 20 dBHL. Left ear-Speech Reception Threshold (SRT) was obtained at 20 dBHL.   Word Recognition Score Tested using NU-6 (recorded) Right ear: 100% was  obtained at a presentation level of 60 dBHL with contralateral masking which is deemed as  excellent. Left ear: 84% was obtained at a presentation level of 65 dBHL with contralateral masking which is deemed as  good .   Impression: There is a significant difference in pure-tone thresholds between ears, worse in the left ear from 1500-8000 Hz.   Recommendations: Follow up with ENT as scheduled. Return for a hearing evaluation if concerns with hearing changes arise or per MD recommendation. Consider a communication needs assessment after medical clearance for hearing aids is obtained.   Coline Calkin MARIE LEROUX-MARTINEZ, AUD

## 2024-03-12 ENCOUNTER — Other Ambulatory Visit: Payer: Self-pay | Admitting: Physician Assistant

## 2024-03-12 NOTE — Telephone Encounter (Signed)
 Last Fill: 02/13/2024  Next Visit: 08/22/2024  Last Visit: 02/20/2025  Dx: Other insomnia   Current Dose per office note on 02/21/2024: Ambien  5 mg 1 tablet by mouth at bedtime for insomnia.   Okay to refill Ambien ?

## 2024-03-13 ENCOUNTER — Ambulatory Visit (HOSPITAL_COMMUNITY)
Admission: RE | Admit: 2024-03-13 | Discharge: 2024-03-13 | Disposition: A | Discharge status: Home / Self Care | Source: Ambulatory Visit | Attending: Nurse Practitioner | Admitting: Nurse Practitioner

## 2024-03-13 DIAGNOSIS — R002 Palpitations: Secondary | ICD-10-CM | POA: Diagnosis not present

## 2024-03-13 DIAGNOSIS — I491 Atrial premature depolarization: Secondary | ICD-10-CM

## 2024-03-13 DIAGNOSIS — I10 Essential (primary) hypertension: Secondary | ICD-10-CM

## 2024-03-13 DIAGNOSIS — E785 Hyperlipidemia, unspecified: Secondary | ICD-10-CM

## 2024-03-13 DIAGNOSIS — I700 Atherosclerosis of aorta: Secondary | ICD-10-CM

## 2024-03-13 DIAGNOSIS — R931 Abnormal findings on diagnostic imaging of heart and coronary circulation: Secondary | ICD-10-CM | POA: Diagnosis not present

## 2024-03-13 LAB — ECHOCARDIOGRAM COMPLETE
AR max vel: 2.08 cm2
AV Area VTI: 1.95 cm2
AV Area mean vel: 1.77 cm2
AV Mean grad: 3 mmHg
AV Peak grad: 5 mmHg
Ao pk vel: 1.12 m/s
Area-P 1/2: 3.16 cm2
S' Lateral: 2.3 cm

## 2024-03-19 ENCOUNTER — Telehealth: Payer: Self-pay | Admitting: Cardiology

## 2024-03-19 NOTE — Telephone Encounter (Signed)
 Patient is requesting to speak with a nurse in regard to Echo results. Please advise.

## 2024-03-19 NOTE — Telephone Encounter (Signed)
 I was able to call the patient and speak to her about her echocardiogram results per Damien Braver, NP. The results were, Recent echocardiogram showed overall normal heart pumping function, the left bottom chamber of the heart is mildly stiff (grade 1 diastolic dysfunction), a common age-related finding. There were no significant valvular abnormalities. Overall, reassuring report, no significant change from prior echocardiogram. Continue current medications and follow-up as planned. Thank you-EM. The patient was appreciative of the call back and she understood the results. She did not have any further questions.

## 2024-03-22 ENCOUNTER — Ambulatory Visit: Admitting: Cardiology

## 2024-03-27 DIAGNOSIS — R002 Palpitations: Secondary | ICD-10-CM | POA: Diagnosis not present

## 2024-03-27 DIAGNOSIS — I491 Atrial premature depolarization: Secondary | ICD-10-CM | POA: Diagnosis not present

## 2024-04-09 ENCOUNTER — Ambulatory Visit (INDEPENDENT_AMBULATORY_CARE_PROVIDER_SITE_OTHER)

## 2024-04-12 ENCOUNTER — Other Ambulatory Visit: Payer: Self-pay | Admitting: Physician Assistant

## 2024-04-12 NOTE — Telephone Encounter (Signed)
 Last Fill: 03/12/2024   Next Visit: 08/22/2024   Last Visit: 02/20/2025   Dx: Other insomnia    Current Dose per office note on 02/21/2024: Ambien  5 mg 1 tablet by mouth at bedtime for insomnia.    Okay to refill Ambien ?

## 2024-05-02 ENCOUNTER — Ambulatory Visit (INDEPENDENT_AMBULATORY_CARE_PROVIDER_SITE_OTHER)

## 2024-05-02 ENCOUNTER — Encounter (INDEPENDENT_AMBULATORY_CARE_PROVIDER_SITE_OTHER): Payer: Self-pay

## 2024-05-02 VITALS — BP 133/85 | HR 92

## 2024-05-02 DIAGNOSIS — R94128 Abnormal results of other function studies of ear and other special senses: Secondary | ICD-10-CM

## 2024-05-02 DIAGNOSIS — R0989 Other specified symptoms and signs involving the circulatory and respiratory systems: Secondary | ICD-10-CM | POA: Diagnosis not present

## 2024-05-02 DIAGNOSIS — J300 Vasomotor rhinitis: Secondary | ICD-10-CM

## 2024-05-02 DIAGNOSIS — L739 Follicular disorder, unspecified: Secondary | ICD-10-CM

## 2024-05-02 DIAGNOSIS — K219 Gastro-esophageal reflux disease without esophagitis: Secondary | ICD-10-CM | POA: Diagnosis not present

## 2024-05-02 DIAGNOSIS — H918X3 Other specified hearing loss, bilateral: Secondary | ICD-10-CM

## 2024-05-02 DIAGNOSIS — R053 Chronic cough: Secondary | ICD-10-CM | POA: Diagnosis not present

## 2024-05-02 NOTE — Progress Notes (Signed)
 Dear Dr. Husain, Here is my assessment for our mutual patient, Christina Deleon. Thank you for allowing me the opportunity to care for your patient. Please do not hesitate to contact me should you have any other questions. Sincerely, Dr. Hadassah Parody  Otolaryngology Clinic Note Referring provider: Dr. Ransom HPI:   Initial HPI (03/01/24 - Christina Deleon) Christina Deleon is a 73 year old female with asthma who presents with chronic cough and voice changes.  She has experienced a persistent cough for about six months, with worsening symptoms over the last three months. The cough is particularly troublesome at night when she lies down, prompting her to get up and make tea to alleviate it. She describes a sensation of a lump in her throat but denies regurgitation or burning chest pain.  She has a history of asthma and uses both a daily inhaler and a rescue inhaler, the latter about two to three times per month. There have been no recent changes in her asthma medications.  She takes famotidine  and pantoprazole  for reflux, both at a dose of 40 mg daily. Her gastroenterologist had previously discontinued pantoprazole , but her primary care doctor restarted it about a month ago. She has not noticed any improvement in her symptoms since restarting the medication.  She experiences post-nasal drainage, particularly when lying down, and uses Dymista  nasal spray, though not daily. She also takes Claritin and another unspecified allergy  medication daily for her dust allergy .  No smoking or recreational drug use. She drinks about one and a half to two bottles of water daily. She has not had a hearing test in approximately thirty years and feels her hearing has declined.  She experiences occasional bumps inside her nose that become sore, and reports having sinus issues all the time.  --------------------------------------------------------- 05/02/2024  Presents for follow-up today with hearing test. She has never  noticed difference in hearing between the 2 sides.  Regarding her postnasal drip and reflux she feels like this is getting a little more tolerable but she admits she has not been taking her medicines lately.  Cough is getting better.  Still throat clearing.  She has not any had any episodes of nasal folliculitis since last seen  Independent Review of Additional Tests or Records:  03/05/24 Audiogram was independently reviewed and interpreted by me and it reveals Right ear: Normal to mild sensorineural hearing loss; 100% word interpretation at 60 dB; type A tympanogram Left ear: Normal sloping to moderate sensorineural hearing loss; 84% word interpretation at 65 dB; type Ad tympanogram      PMH/Meds/All/SocHx/FamHx/ROS:   Past Medical History:  Diagnosis Date   Adenomatous colon polyp 10/1997, and 03/2009   Allergic rhinitis    Allergy     Aortic atherosclerosis    Arthritis    osteo   Asthma    B12 deficiency    Chronic gastritis    Coronary artery calcification seen on CT scan    Fibromyalgia    GERD (gastroesophageal reflux disease)    Hemorrhoids    Hyperlipidemia    Hypertension    Menorrhagia    partial hysterectomy   Migraines    Osteoarthritis    PONV (postoperative nausea and vomiting)    Premature atrial contractions    Sleep disturbance    STD (sexually transmitted disease) 01/22/11 culture proven   HSV type I labia     Past Surgical History:  Procedure Laterality Date   CATARACT EXTRACTION Bilateral 10/16 & 11/16   EXCISION METACARPAL MASS Right 11/16/2016  Procedure: RIGHT LITTLE FINGER CYST EXCISION;  Surgeon: Sebastian Lenis, MD;  Location:  [facility];  Service: Orthopedics;  Laterality: Right;   TONSILLECTOMY  age 71   tooth extract     with bone graft   Turbinate sinus surgery  2003   UPPER GI ENDOSCOPY  07/2022   VAGINAL HYSTERECTOMY  1998   secondary to prolapse, ovaries remain    Family History  Problem Relation Age of Onset    Coronary artery disease Mother    Hypertension Mother    Hyperlipidemia Mother    Pulmonary fibrosis Mother    Lung disease Mother    Throat cancer Father    Coronary artery disease Father    Heart attack Father 70   Stroke Father        during procedure   Prostate cancer Brother    Heart disease Brother    Diabetes Brother    Diabetes Paternal Aunt        x 2   Prostate cancer Paternal Uncle    Stroke Maternal Grandmother        or MI   Diabetes Paternal Grandmother    Coronary artery disease Paternal Grandmother    Migraines Daughter    Migraines Daughter    Colon cancer Neg Hx      Social Connections: Not on file     Current Outpatient Medications  Medication Instructions   ADVAIR  HFA 115-21 MCG/ACT inhaler INHALE 2 PUFFS BY MOUTH 1 TO 2 TIMES PER DAY   albuterol  (VENTOLIN  HFA) 108 (90 Base) MCG/ACT inhaler 2 puffs, Inhalation, Every 6 hours PRN   ALPRAZolam (XANAX) 0.5 MG tablet As needed   Ascorbic Acid (VITAMIN C PO) 1 tablet, Daily   Azelastine -Fluticasone  137-50 MCG/ACT SUSP SHAKE LIQUID AND USE ONE SPRAY IN EACH NOSTRIL ONE TO TWO TIMES DAILY.   CALCIUM  PO 1 tablet, Daily   candesartan (ATACAND) 8 mg, Daily at bedtime   cetirizine  (ZYRTEC ) 10 mg, Oral, Daily, One tab daily for allergies   clotrimazole  (MYCELEX ) 10 MG troche CAN USE 1 TROCHE IN THE MOUTH ONCE DAILY AS NEEDED   Cyanocobalamin (B-12 COMPLIANCE INJECTION IJ) Inject as directed. Once monthly   diclofenac  (CATAFLAM ) 50 mg, Oral, 2 times daily   dicyclomine  (BENTYL ) 10 mg, Oral, 3 times daily PRN   famotidine  (PEPCID ) 40 MG tablet Can take one tablet one to two times daily as directed.   hydrochlorothiazide (HYDRODIURIL) 12.5 MG tablet    hydrocortisone  (ANUSOL -HC) 25 mg, Rectal, Daily at bedtime, One per night for 2 weeks   ipratropium (ATROVENT ) 0.03 % nasal spray 2 sprays, Each Nare, Nightly   ipratropium-albuterol  (DUONEB) 0.5-2.5 (3) MG/3ML SOLN Use one vial in the nebulizer every 4-6 hours if  needed for cough or wheeze.   Lifitegrast 5 % SOLN 1 drop, 2 times daily   methocarbamol  (ROBAXIN ) 500 MG tablet TAKE 1 TABLET BY MOUTH DAILY AS NEEDED FOR MUSCLE SPASMS   montelukast  (SINGULAIR ) 10 MG tablet TAKE 1 TABLET(10 MG) BY MOUTH DAILY   Multiple Vitamin (MULTI-VITAMIN) tablet Take by mouth.   mupirocin  ointment (BACTROBAN ) 2 % 1 Application, Topical, 2 times daily, To bilateral nasal cavities as needed   rosuvastatin  (CRESTOR ) 20 MG tablet TAKE 1 TABLET(20 MG) BY MOUTH DAILY   VITAMIN A PO Daily   Vitamin D  2,000 Units, Daily   zolmitriptan  (ZOMIG -ZMT) 5 mg, Oral, As needed   zolpidem  (AMBIEN ) 5 MG tablet TAKE 1 TABLET(5 MG) BY MOUTH AT BEDTIME AS NEEDED FOR  SLEEP     Physical Exam:   BP 133/85 (BP Location: Left Arm, Patient Position: Sitting)   Pulse 92   LMP 07/02/1994   SpO2 98%   Salient findings:  CN II-XII intact with the exception of CNVIII as above\  Given history and complaints, ear microscopy was indicated and performed for evaluation with findings as below in physical exam section and in procedures  Bilateral EAC clear and TM intact with well pneumatized middle ear spaces  No obviously palpable neck masses/lymphadenopathy/thyromegaly  No respiratory distress or stridor  Seprately Identifiable Procedures:  Prior to initiating any procedures, risks/benefits/alternatives were explained to the patient and verbal consent obtained.  Procedure (05/02/2024): Bilateral ear microscopy using microscope (CPT [id number]) Pre-procedure diagnosis: Asymmetric hearing loss, abnormal tympanogram on the left Post-procedure diagnosis: same Indication: see above; given patient's otologic complaints and history, for improved and comprehensive examination of external ear and tympanic membrane, bilateral otologic examination using microscope was performed. Prior to proceeding, verbal consent was obtained after discussion of R/B/A  Procedure: Patient was placed semi-recumbent. Both ear  canals were examined using the microscope with findings above. Patient tolerated the procedure well.   Impression & Plans:  Renia Mikelson is a 73 y.o. female with   1. Asymmetrical hearing loss   2. Abnormal tympanogram   3. Chronic throat clearing   4. Nasal folliculitis   5. Laryngopharyngeal reflux (LPR)   6. Vasomotor rhinitis    Assessment and Plan Assessment & Plan Asymmetrical bilateral hearing loss She presents with asymmetrical bilateral hearing loss on recent audiogram with greater than 15% difference in speech discrimination scores.  The degree of asymmetry meets criteria for further evaluation to exclude vestibular schwannoma.  She would like to proceed with this - Ordered MRI of the internal auditory canals to assess for retrocochlear pathology. - Provided contact information for MRI scheduling if she is not contacted by the imaging center. - Planned follow-up in approximately one month to review MRI results and discuss further management.  Chronic cough and postnasal drainage Chronic cough and postnasal drainage likely due to postnasal drip from allergic and vasomotor rhinitis, worsening at night when supine. - Continue ipratropium bromide  nasal spray for nighttime use  Allergic and vasomotor rhinitis Allergic and vasomotor rhinitis contributing to postnasal drainage and chronic cough. - Continue Dymista  nasal spray and oral allergy  medications. - Consider ipratropium bromide  nasal spray for additional symptom control.  Laryngopharyngeal reflux (LPR) Suspected laryngopharyngeal reflux due to nighttime cough and possible silent reflux affecting vocal cords. Current treatment includes pantoprazole  and famotidine  with no significant improvement. - Advise avoiding eating before bedtime to reduce reflux symptoms. - Continue current reflux medications.  Chronic throat clearing - Related to LPR as above and chronic cough.  Discussed referral to speech therapy for cough  suppression and throat clearing suppression techniques.  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 - Continue mupirocin  ointment for use as needed during flare-ups.   See below regarding exact medications prescribed this encounter including dosages and route: No orders of the defined types were placed in this encounter.   Thank you for allowing me the opportunity to care for your patient. Please do not hesitate to contact me should you have any other questions.  Sincerely, Hadassah Parody, MD Otolaryngologist (ENT), [facility] Phone: [phone] Fax: [phone]  MDM:  Level 4 Complexity/Problems addressed: 4-multiple  chronic problem Data complexity: 4-  independent review of audiogram - Morbidity: Low-   - Prescription Drug prescribed or managed: No

## 2024-05-02 NOTE — Patient Instructions (Addendum)
 We are going to get an MRI due to the fact that your hearing is worse on your left than your right. This is to rule out any growths on your hearing nerve on that side.   If you want in the future we can place a referral to a speech therapist for throat clearing suppression therapy    I have ordered an imaging study for you to complete prior to your next visit. Please call [facility] Scheduling at [phone] to schedule your imaging if you have not received a call within 24 hours. If you are unable to complete your imaging study prior to your next scheduled visit please call our office to let us  know.

## 2024-05-08 ENCOUNTER — Ambulatory Visit: Attending: Cardiology | Admitting: Cardiology

## 2024-05-08 VITALS — BP 120/66 | HR 82 | Ht 64.0 in | Wt 148.0 lb

## 2024-05-08 DIAGNOSIS — I491 Atrial premature depolarization: Secondary | ICD-10-CM | POA: Diagnosis not present

## 2024-05-08 DIAGNOSIS — R931 Abnormal findings on diagnostic imaging of heart and coronary circulation: Secondary | ICD-10-CM

## 2024-05-08 DIAGNOSIS — I700 Atherosclerosis of aorta: Secondary | ICD-10-CM

## 2024-05-08 DIAGNOSIS — R002 Palpitations: Secondary | ICD-10-CM | POA: Diagnosis not present

## 2024-05-08 NOTE — Progress Notes (Signed)
 " Cardiology Office Note:  .   Date:  05/08/2024  ID:  Christina Deleon, DOB [birth year], MRN [id number] PCP: Ransom Other, MD  [facility] Providers Cardiologist:  Oneil Parchment, MD    History of Present Illness: .   Christina Deleon is a 74 y.o. female Discussed the use of AI scribe   History of Present Illness Christina Deleon is a 74 year old female with hypertension and coronary artery disease who presents with dizziness and a faint feeling. Former patient of Doctor Victory Sharps.  She experiences a faint feeling approximately once a week, with the most recent episode occurring this morning. Bending over leads to dizziness and sometimes nausea. She is currently taking hydrochlorothiazide 12.5 mg daily and candesartan 8 mg at bedtime for hypertension.  She mentions occasional chest tightness, which she attributes to asthma and finds relief by using her inhaler. She reports that she does not experience chest tightness when going upstairs or uphill.  Her long-term monitor on March 13, 2024, showed predominantly normal sinus rhythm with occasional atrial tachycardia, the longest episode being 19 beats. Rare PACs and PVCs were noted with no significant arrhythmias. An echocardiogram on the same date showed normal pump function, grade one diastolic dysfunction, and no valvular abnormalities. A coronary CT calcium  score in 2021 was 177, placing her in the 85th percentile, and she had descending aortic atherosclerosis.  She is on rosuvastatin  20 mg for cholesterol management, with a current LDL of 59. She follows a diet low in meat, preferring seafood, and avoids caffeine. She enjoys soups but is mindful of salt intake due to her blood pressure.       Studies Reviewed: .        Results Labs Creatinine: 0.98 TSH: 3.1 LDL: 59  Radiology Coronary CT calcium  score (2021): Calcium  score 177, 85th percentile; descending aortic atherosclerosis  Diagnostic Long-term cardiac monitor (03/13/2024):  Predominantly normal sinus rhythm; occasional atrial tachycardia, longest episode 19 beats; rare premature atrial and ventricular contractions; no significant arrhythmias Echocardiogram (03/13/2024): Normal left ventricular systolic function; grade 1 diastolic dysfunction; no valvular abnormalities Risk Assessment/Calculations:            Physical Exam:   VS:  BP 120/66 (BP Location: Left Arm, Patient Position: Sitting, Cuff Size: Normal)   Pulse 82   Ht 5' 4 (1.626 m)   Wt 148 lb (67.1 kg)   LMP 07/02/1994   SpO2 98%   BMI 25.40 kg/m    Wt Readings from Last 3 Encounters:  05/08/24 148 lb (67.1 kg)  03/01/24 150 lb (68 kg)  02/21/24 150 lb (68 kg)    GEN: Well nourished, well developed in no acute distress NECK: No JVD; No carotid bruits CARDIAC: RRR, no murmurs, no rubs, no gallops RESPIRATORY:  Clear to auscultation without rales, wheezing or rhonchi  ABDOMEN: Soft, non-tender, non-distended EXTREMITIES:  No edema; No deformity   ASSESSMENT AND PLAN: .    Assessment and Plan Assessment & Plan Atrial tachycardia and premature atrial contractions Long-term monitor showed predominantly normal sinus rhythm with occasional atrial tachycardia, longest episode 19 beats. Rare PACs and PVCs with no significant arrhythmias. Echocardiogram showed normal pump function and grade one diastolic dysfunction. Atrial tachycardia is common and not dangerous, not causing atrial fibrillation. No medication needed as episodes are infrequent and self-resolving. - Continue current management without medication for atrial tachycardia.  She avoids caffeine.  Hypertension Blood pressure well controlled with current regimen. Today's reading was 120/66 mmHg, slightly  low. Occasional dizziness when bending over, possibly due to low blood pressure. Current medications include hydrochlorothiazide and candesartan. - Monitor blood pressure at home periodically. - Will consider discontinuing  hydrochlorothiazide if blood pressure is consistently low.  Hyperlipidemia with coronary artery atherosclerosis LDL cholesterol is well controlled at 59 mg/dL, below target of less than 70 mg/dL. Coronary CT calcium  score in 2021 was 177, at the 85th percentile. Current management with rosuvastatin  is effective. - Continue rosuvastatin  20 mg daily. - Maintain Mediterranean diet and regular exercise.  Diastolic dysfunction typical with aging Echocardiogram showed grade one diastolic dysfunction. No significant valvular abnormalities. Condition is stable with current management. - Continue current management.  Asthma Intermittent chest tightness relieved by inhaler use. No exertional chest pain reported. Symptoms are well managed with current treatment. - Continue current asthma management with inhaler as needed.         Dispo: 1 yr  Signed, Oneil Parchment, MD  "

## 2024-05-08 NOTE — Patient Instructions (Signed)

## 2024-05-14 ENCOUNTER — Other Ambulatory Visit (HOSPITAL_COMMUNITY)

## 2024-05-15 ENCOUNTER — Other Ambulatory Visit: Payer: Self-pay | Admitting: Rheumatology

## 2024-05-15 NOTE — Telephone Encounter (Signed)
 Last Fill: 04/12/2024   Next Visit: 08/22/2024   Last Visit: 02/20/2025   Dx: Other insomnia    Current Dose per office note on 02/21/2024: Ambien  5 mg 1 tablet by mouth at bedtime for insomnia.    Okay to refill Ambien ?

## 2024-05-16 ENCOUNTER — Encounter: Payer: Self-pay | Admitting: Neurology

## 2024-05-16 ENCOUNTER — Ambulatory Visit: Admitting: Neurology

## 2024-05-16 VITALS — BP 121/74 | HR 90 | Ht 64.0 in | Wt 153.0 lb

## 2024-05-16 DIAGNOSIS — G43009 Migraine without aura, not intractable, without status migrainosus: Secondary | ICD-10-CM

## 2024-05-16 MED ORDER — ZOLMITRIPTAN 5 MG PO TBDP: 5.0000 mg | ORAL_TABLET | ORAL | 11 refills | Status: AC | PRN

## 2024-05-16 NOTE — Progress Notes (Signed)
 "   Follow-up Visit   Date: 05/16/2024   Christina Deleon MRN: [id number] DOB: [birth year]   Interim History: Christina Deleon is a 74 y.o. right-handed female with GERD, hypertension, hyperlipidemia, and fibromyalgia returning to the clinic for follow-up of chronic migraine.  The patient was accompanied to the clinic by self.   IMPRESSION/PLAN: Assessment & Plan Episodic migraine Headaches occur twice monthly, managed with Zolmitriptan  and neck pain with diclofenac . - Continue Zolmitriptan  (Zomig ) 2.5 - 5.mg as needed  Dysesthesias Numbness and tingling improved with B12 supplementation. Balance issues noted. - Continue B12 supplementation.  Return to clinic in 1 year  -------------------------------- UPDATE 05/16/2024:  Discussed the use of AI scribe software for clinical note transcription with the patient, who gave verbal consent to proceed.  History of Present Illness Christina Deleon is a 74 year old female who presents for a one-year follow-up visit.  She experiences headaches approximately twice a month, originating from her neck and radiating upwards. If she addresses the neck pain early, she can often avoid taking headache medication. She uses Zomig , which alleviates her headache symptoms within about an hour. For neck pain, she uses diclofenac .  She experiences numbness and tingling in her arms, which has improved. She continues to take B12 supplements due to previously low levels.  She reports occasional balance issues, particularly when bending over or walking up steps, feeling as though she might fall backwards. These episodes are more frequent when she has sinus issues.     Medications:  Current Outpatient Medications on File Prior to Visit  Medication Sig Dispense Refill   ADVAIR  HFA 115-21 MCG/ACT inhaler INHALE 2 PUFFS BY MOUTH 1 TO 2 TIMES PER DAY 12 g 5   albuterol  (VENTOLIN  HFA) 108 (90 Base) MCG/ACT inhaler INHALE 2 PUFFS INTO THE LUNGS EVERY 6 HOURS AS NEEDED FOR  WHEEZING OR SHORTNESS OF BREATH 6.7 g 1   ALPRAZolam (XANAX) 0.5 MG tablet as needed.     Ascorbic Acid (VITAMIN C PO) Take 1 tablet by mouth daily.     Azelastine -Fluticasone  137-50 MCG/ACT SUSP SHAKE LIQUID AND USE ONE SPRAY IN EACH NOSTRIL ONE TO TWO TIMES DAILY. 23 g 5   CALCIUM  PO Take 1 tablet by mouth daily.     candesartan (ATACAND) 16 MG tablet Take 8 mg by mouth at bedtime.     cetirizine  (ZYRTEC ) 10 MG tablet Take 1 tablet (10 mg total) by mouth daily. One tab daily for allergies 30 tablet 1   Cholecalciferol (VITAMIN D ) 2000 units tablet Take 2,000 Units by mouth daily.     clotrimazole  (MYCELEX ) 10 MG troche CAN USE 1 TROCHE IN THE MOUTH ONCE DAILY AS NEEDED 30 Troche 5   Cyanocobalamin (B-12 COMPLIANCE INJECTION IJ) Inject as directed. Once monthly     diclofenac  (CATAFLAM ) 50 MG tablet Take 1 tablet (50 mg total) by mouth 2 (two) times daily. 180 tablet 4   dicyclomine  (BENTYL ) 10 MG capsule Take 1 capsule (10 mg total) by mouth 3 (three) times daily as needed for spasms. 90 capsule 4   famotidine  (PEPCID ) 40 MG tablet Can take one tablet one to two times daily as directed. 180 tablet 1   hydrochlorothiazide (HYDRODIURIL) 12.5 MG tablet      hydrocortisone  (ANUSOL -HC) 25 MG suppository Place 1 suppository (25 mg total) rectally at bedtime. One per night for 2 weeks 30 suppository 6   ipratropium (ATROVENT ) 0.03 % nasal spray Place 2 sprays into both nostrils at bedtime. 30 mL  2   ipratropium-albuterol  (DUONEB) 0.5-2.5 (3) MG/3ML SOLN Use one vial in the nebulizer every 4-6 hours if needed for cough or wheeze. 300 mL 1   Lifitegrast 5 % SOLN Apply 1 drop to eye 2 (two) times daily.     methocarbamol  (ROBAXIN ) 500 MG tablet TAKE 1 TABLET BY MOUTH DAILY AS NEEDED FOR MUSCLE SPASMS 30 tablet 2   montelukast  (SINGULAIR ) 10 MG tablet TAKE 1 TABLET(10 MG) BY MOUTH DAILY 90 tablet 1   Multiple Vitamin (MULTI-VITAMIN) tablet Take by mouth.     mupirocin  ointment (BACTROBAN ) 2 % Apply 1  Application topically 2 (two) times daily. To bilateral nasal cavities as needed 22 g 0   rosuvastatin  (CRESTOR ) 20 MG tablet TAKE 1 TABLET(20 MG) BY MOUTH DAILY 90 tablet 3   VITAMIN A PO Take by mouth daily.     zolmitriptan  (ZOMIG -ZMT) 5 MG disintegrating tablet Take 1 tablet (5 mg total) by mouth as needed for migraine. 10 tablet 11   zolpidem  (AMBIEN ) 5 MG tablet TAKE 1 TABLET(5 MG) BY MOUTH AT BEDTIME AS NEEDED FOR SLEEP 30 tablet 0   No current facility-administered medications on file prior to visit.    Allergies:  Allergies  Allergen Reactions   Trazodone Hcl Other (See Comments)    REACTION: bad headaches   Clindamycin /Lincomycin Other (See Comments)    headaches   Penicillins Nausea Only and Rash    Patient has taken Amoxicillin  without an issue, per patient. Has patient had a PCN reaction causing immediate rash, facial/tongue/throat swelling, SOB or lightheadedness with hypotension: No Has patient had a PCN reaction causing severe rash involving mucus membranes or skin necrosis: No Has patient had a PCN reaction that required hospitalization No Has patient had a PCN reaction occurring within the last 10 years: No If all of the above answers are NO, then may proceed with Cephalosporin use.     Vital Signs:  BP 121/74   Pulse 90   Ht 5' 4 (1.626 m)   Wt 153 lb (69.4 kg)   LMP 07/02/1994   SpO2 99%   BMI 26.26 kg/m   Neurological Exam: MENTAL STATUS including orientation to time, place, person, recent and remote memory, attention span and concentration, language, and fund of knowledge is normal.  Speech is not dysarthric.  CRANIAL NERVES:   Pupils round and reactive bilaterally. No ptosis. Extraocular muscles intact. Face is symmetric.  Palate elevates symmetrically.   MOTOR:  Motor strength is 5/5 in all extremities.  No atrophy, fasciculations or abnormal movements.    COORDINATION/GAIT:   Gait narrow based and stable.   Data:  MRI/A brain 10/01/2020:    Unremarkable MRI and MR angiogram of the brain.  Lab Results  Component Value Date   TSH 3.120 02/08/2024   Lab Results  Component Value Date   VITAMINB12 91 (L) 05/07/2022   Vitamin B12 08/2023:  > 1500   Thank you for allowing me to participate in patient's care.  If I can answer any additional questions, I would be pleased to do so.    Sincerely,    Almarosa Bohac K. Tobie, DO  "

## 2024-05-21 ENCOUNTER — Ambulatory Visit: Payer: Medicare Other | Admitting: Neurology

## 2024-05-23 ENCOUNTER — Ambulatory Visit (HOSPITAL_COMMUNITY)
Admission: RE | Admit: 2024-05-23 | Discharge: 2024-05-23 | Disposition: A | Discharge status: Home / Self Care | Source: Ambulatory Visit

## 2024-05-23 DIAGNOSIS — H918X9 Other specified hearing loss, unspecified ear: Secondary | ICD-10-CM

## 2024-05-23 DIAGNOSIS — H918X3 Other specified hearing loss, bilateral: Secondary | ICD-10-CM | POA: Diagnosis present

## 2024-05-23 MED ORDER — GADOBUTROL 1 MMOL/ML IV SOLN
7.0000 mL | Freq: Once | INTRAVENOUS | Status: AC | PRN
  Administered 2024-05-23: 7 mL via INTRAVENOUS

## 2024-06-06 ENCOUNTER — Ambulatory Visit (INDEPENDENT_AMBULATORY_CARE_PROVIDER_SITE_OTHER)

## 2024-07-04 ENCOUNTER — Ambulatory Visit (INDEPENDENT_AMBULATORY_CARE_PROVIDER_SITE_OTHER)

## 2024-07-19 ENCOUNTER — Ambulatory Visit: Admitting: Allergy and Immunology

## 2024-08-22 ENCOUNTER — Ambulatory Visit: Admitting: Rheumatology

## 2025-05-20 ENCOUNTER — Ambulatory Visit: Payer: Self-pay | Admitting: Neurology
